# Patient Record
Sex: Male | Born: 1980 | Race: Black or African American | Hispanic: No | Marital: Single | Smoking: Current some day smoker
Health system: Southern US, Community
[De-identification: ages and names within clinical notes are randomized; demographics above are authoritative.]

## PROBLEM LIST (undated history)

## (undated) DIAGNOSIS — K589 Irritable bowel syndrome without diarrhea: Secondary | ICD-10-CM

## (undated) DIAGNOSIS — R7303 Prediabetes: Secondary | ICD-10-CM

## (undated) DIAGNOSIS — F17200 Nicotine dependence, unspecified, uncomplicated: Secondary | ICD-10-CM

## (undated) DIAGNOSIS — F99 Mental disorder, not otherwise specified: Secondary | ICD-10-CM

## (undated) DIAGNOSIS — K5792 Diverticulitis of intestine, part unspecified, without perforation or abscess without bleeding: Secondary | ICD-10-CM

## (undated) DIAGNOSIS — Z9289 Personal history of other medical treatment: Secondary | ICD-10-CM

## (undated) DIAGNOSIS — N200 Calculus of kidney: Secondary | ICD-10-CM

## (undated) DIAGNOSIS — D689 Coagulation defect, unspecified: Secondary | ICD-10-CM

## (undated) DIAGNOSIS — IMO0001 Reserved for inherently not codable concepts without codable children: Secondary | ICD-10-CM

## (undated) DIAGNOSIS — F209 Schizophrenia, unspecified: Secondary | ICD-10-CM

## (undated) DIAGNOSIS — M940 Chondrocostal junction syndrome [Tietze]: Secondary | ICD-10-CM

## (undated) DIAGNOSIS — N419 Inflammatory disease of prostate, unspecified: Secondary | ICD-10-CM

## (undated) DIAGNOSIS — A6000 Herpesviral infection of urogenital system, unspecified: Secondary | ICD-10-CM

## (undated) HISTORY — DX: Inflammatory disease of prostate, unspecified: N41.9

## (undated) HISTORY — DX: Calculus of kidney: N20.0

## (undated) HISTORY — DX: Coagulation defect, unspecified: D68.9

## (undated) HISTORY — DX: Nicotine dependence, unspecified, uncomplicated: F17.200

## (undated) HISTORY — DX: Diverticulitis of intestine, part unspecified, without perforation or abscess without bleeding: K57.92

## (undated) HISTORY — DX: Schizophrenia, unspecified: F20.9

## (undated) HISTORY — DX: Personal history of other medical treatment: Z92.89

## (undated) HISTORY — DX: Herpesviral infection of urogenital system, unspecified: A60.00

## (undated) HISTORY — DX: Chondrocostal junction syndrome (tietze): M94.0

## (undated) HISTORY — PX: WISDOM TOOTH EXTRACTION: SHX21

## (undated) HISTORY — DX: Reserved for inherently not codable concepts without codable children: IMO0001

---

## 2002-09-02 ENCOUNTER — Emergency Department (HOSPITAL_COMMUNITY): Admission: EM | Admit: 2002-09-02 | Discharge: 2002-09-02 | Payer: Self-pay | Admitting: Emergency Medicine

## 2004-07-11 ENCOUNTER — Ambulatory Visit: Payer: Self-pay | Admitting: Psychiatry

## 2004-07-11 ENCOUNTER — Emergency Department (HOSPITAL_COMMUNITY): Admission: EM | Admit: 2004-07-11 | Discharge: 2004-07-11 | Payer: Self-pay | Admitting: Family Medicine

## 2004-07-11 ENCOUNTER — Inpatient Hospital Stay (HOSPITAL_COMMUNITY): Admission: RE | Admit: 2004-07-11 | Discharge: 2004-07-24 | Payer: Self-pay | Admitting: Psychiatry

## 2009-10-14 ENCOUNTER — Emergency Department (HOSPITAL_COMMUNITY): Admission: EM | Admit: 2009-10-14 | Discharge: 2009-10-14 | Payer: Self-pay | Admitting: Emergency Medicine

## 2010-01-01 ENCOUNTER — Ambulatory Visit (HOSPITAL_COMMUNITY): Admission: RE | Admit: 2010-01-01 | Discharge: 2010-01-01 | Payer: Self-pay | Admitting: Neurosurgery

## 2010-06-01 DIAGNOSIS — A6000 Herpesviral infection of urogenital system, unspecified: Secondary | ICD-10-CM

## 2010-06-01 HISTORY — DX: Herpesviral infection of urogenital system, unspecified: A60.00

## 2010-06-22 ENCOUNTER — Encounter: Payer: Self-pay | Admitting: Neurosurgery

## 2010-08-18 LAB — COMPREHENSIVE METABOLIC PANEL
ALT: 17 U/L (ref 0–53)
Albumin: 4.3 g/dL (ref 3.5–5.2)
Alkaline Phosphatase: 78 U/L (ref 39–117)
BUN: 11 mg/dL (ref 6–23)
Chloride: 106 mEq/L (ref 96–112)
Glucose, Bld: 88 mg/dL (ref 70–99)
Potassium: 4.2 mEq/L (ref 3.5–5.1)
Sodium: 142 mEq/L (ref 135–145)
Total Bilirubin: 1.1 mg/dL (ref 0.3–1.2)
Total Protein: 7.5 g/dL (ref 6.0–8.3)

## 2010-08-18 LAB — CBC
HCT: 41.9 % (ref 39.0–52.0)
Hemoglobin: 13.9 g/dL (ref 13.0–17.0)
RDW: 13.7 % (ref 11.5–15.5)
WBC: 5 10*3/uL (ref 4.0–10.5)

## 2010-08-18 LAB — URINALYSIS, ROUTINE W REFLEX MICROSCOPIC
Glucose, UA: NEGATIVE mg/dL
Ketones, ur: 15 mg/dL — AB
Nitrite: NEGATIVE
Specific Gravity, Urine: 1.027 (ref 1.005–1.030)
pH: 5.5 (ref 5.0–8.0)

## 2010-08-18 LAB — DIFFERENTIAL
Basophils Absolute: 0 10*3/uL (ref 0.0–0.1)
Basophils Relative: 1 % (ref 0–1)
Eosinophils Absolute: 0.2 10*3/uL (ref 0.0–0.7)
Monocytes Absolute: 0.5 10*3/uL (ref 0.1–1.0)
Monocytes Relative: 9 % (ref 3–12)
Neutrophils Relative %: 45 % (ref 43–77)

## 2010-10-03 ENCOUNTER — Other Ambulatory Visit (HOSPITAL_COMMUNITY): Payer: Self-pay | Admitting: Gastroenterology

## 2010-10-17 NOTE — H&P (Signed)
NAMEORREN, PIETSCH NO.:  [id number]   MEDICAL RECORD NO.:  [id number]          PATIENT TYPE:  IPS   LOCATION:  0406                          FACILITY:  [facility]   PHYSICIAN:  Jeanice Lim, M.D. DATE OF BIRTH:  07/14/1980   DATE OF ADMISSION:  07/11/2004  DATE OF DISCHARGE:                         PSYCHIATRIC ADMISSION ASSESSMENT   IDENTIFYING INFORMATION:  This is an involuntary admission.  This is a 30-  year-old single African-American male.  Apparently a week and a half ago his  mother drove up to [place] to check on him.  He had not been to work since  that Wednesday and she got up there on Friday.  She found his apartment  completely just a mess.  He had cut his hair off.  It was all over the  floor.  He had food in various stages of cooking and disintegration all over  the kitchen, etc.  He came back here to [facility] with her this past week  and through the week he has been alternating between her home and his  grandmother's home.  He is still acting bizarrely.  He acknowledges that  when he was in [place] he did hear voices.  He states the voices have gone  away.  He does not understand why he is here.  In talking with him today, he  states he is not happy with his career but he is not sure what he should be  doing.   SOCIAL HISTORY:  He graduated in 2004.  He is employed as an Art gallery manager.  He  has never married.  He has no children.   FAMILY HISTORY:  He denies.   ALCOHOL AND DRUG ABUSE:  He denies.   PAST MEDICAL HISTORY:  Primary care Holden Draughon:  Does not have one as he has  no medical problems.  He is not prescribed medications and he has no known  drug allergies.  He was brought to the emergency room at [facility] yesterday, and  his urine drug screen was completely negative.  He had no alcohol on board,  and basically this was how he was committed, for an evaluation.  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  It could be  major depressive disorder with psychotic features, versus brief psychotic  episode.  I do not know.  It was prescribed that he take Zyprexa 15 mg last  night, however he refused it.   MENTAL STATUS EXAM:  Today, he is alert and oriented x3.  He is  appropriately groomed, appears to be well-nourished.  His gait is normal.  His motor is somewhat restless.  He is moving around, exercising all the  time.  He has moderately good eye contact.  His speech is a normal rate,  rhythm and tone, it is not pressured.  There were no delusions.  There is no  paranoia detected.  His mood, he states his mood might be a little  depressed.  His affect shows a normal range.  His thought processes are  clear, rational and goal oriented.  He would like discharge.  Judgment and  insight  are fair.  Concentration and memory are intact.  Intelligence is at  least average.  He denies suicidal or homicidal ideation.  He denies  auditory or visual hallucinations.   ADMISSION DIAGNOSES:   AXIS I:  Major depressive disorder with psychotic features, auditory  hallucinations versus brief psychotic reaction versus dissociative disorder.   AXIS II:  Deferred.   AXIS III:  None.   AXIS IV:  None.   AXIS V:  Global assessment of function is 35.Marland Kitchen   PLAN:  We will admit for safety and stabilization.  He can discuss  converting his involuntary admission to a voluntary one tomorrow.  His mood  is here.  We explained to her that without acting out behavior it would be  difficult to force medications on him and for the moment he is still  refusing medications.      MD/MEDQ  D:  07/12/2004  T:  07/12/2004  Job:  [id number]

## 2010-10-17 NOTE — Discharge Summary (Signed)
NAME:  Scott Farrell, CHECK NO.:  [id number]   MEDICAL RECORD NO.:  [id number]          PATIENT TYPE:  IPS   LOCATION:  0407                          FACILITY:  [facility]   PHYSICIAN:  Carolanne Grumbling, M.D.    DATE OF BIRTH:  08-25-1980   DATE OF ADMISSION:  07/11/2004  DATE OF DISCHARGE:  07/24/2004                                 DISCHARGE SUMMARY   INITIAL ASSESSMENT AND DIAGNOSIS:  Scott Farrell was admitted to the hospital  after at least a week and a half of bizarre behavior where he apparently had  been hearing voice and doing strange things for him, such as having an  apartment which was not clean, shaving his head, leaving the hair on the  floor, leaving food out until it was decomposing on his cabinets, not going  out of his house.  A missing persons bulletin was put out for him because he  was not answering his phone.  He lives in [place].  His parents are in  [place].  Apparently the disorder was discovered at the time of  coming to his house to find where he was.  Consequently he was admitted to  the hospital here.  While in the hospital, his laboratory exams we within  normal limits and noncontributory including a thyroid function test.   HOSPITAL COURSE:  While in the hospital he remained a bit strange and  withdrawn.  His mental status basically exhibited a young man with flat  affect who was suspicious of other people.  He seemed to be paranoid at  times, was talking to himself, also was doing strange things such as  exercises in the middle of the hallway.  One time he came to a group without  his clothes on, just his underwear, another time was kissing the floor,  seemed to be doing ritual-like things that was talking to and responding to  as if other people were not there.  He was given medication which at one  point was discovered he had been cheeking.  Initially when he took his  medication he began to look some better, and then his progress  flattened  out, but once it was sure that he was taking his medication again he cleared  fairly quickly.  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  The medications he used while he was in the hospital were Zyprexa initially,  and that was changed to Zyprexa Zydis to make sure that he was actually  taking it.  By the time of discharge he had had two sessions with his  mother, he was looking much better, still somewhat flat and somewhat  withdrawn, but affect was basically appropriate.  He was able to carry on a  conversation.  There was no evidence that he was hearing voices or seeing  things, and if he were he was able to keep it to himself without it being  noticed.  He was showering and dressing appropriately.  He denied any  threats towards himself or  anyone else and said he was looking forward to  going home.   POST HOSPITAL CARE PLANS:  He was to go home with his mother.  Eventually he  was to return to [place], but that decision was up in the air at that  point.  He was to follow up at the [facility] with  an appointment with Dr. Jacqulyn Bath on March 2 and to see Abel Presto on February  27.  There were no restrictions placed on his activities or his diet at the  time of discharge.   DISCHARGE MEDICATIONS:  1.  Effexor XR 75 mg daily.  2.  Zyprexa Zydis 20 mg at bedtime.   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   AXIS II:  Deferred/   AXIS III:  Healthy.   AXIS IV:  Moderate.   AXIS V:  50/65.      GT/MEDQ  D:  08/05/2004  T:  08/05/2004  Job:  [id number]

## 2010-10-31 ENCOUNTER — Other Ambulatory Visit (HOSPITAL_COMMUNITY): Payer: Self-pay

## 2010-10-31 ENCOUNTER — Inpatient Hospital Stay (HOSPITAL_COMMUNITY)
Admission: RE | Admit: 2010-10-31 | Discharge: 2010-10-31 | Payer: Self-pay | Source: Ambulatory Visit | Attending: Gastroenterology | Admitting: Gastroenterology

## 2010-11-05 ENCOUNTER — Encounter: Payer: Self-pay | Admitting: Medical

## 2010-11-11 ENCOUNTER — Encounter: Payer: Self-pay | Admitting: Medical

## 2010-11-11 ENCOUNTER — Other Ambulatory Visit: Payer: Self-pay

## 2010-11-11 ENCOUNTER — Ambulatory Visit (INDEPENDENT_AMBULATORY_CARE_PROVIDER_SITE_OTHER): Payer: 59 | Admitting: Medical

## 2010-11-11 DIAGNOSIS — Z0000 Encounter for general adult medical examination without abnormal findings: Secondary | ICD-10-CM

## 2010-11-11 DIAGNOSIS — N452 Orchitis: Secondary | ICD-10-CM

## 2010-11-11 DIAGNOSIS — R5381 Other malaise: Secondary | ICD-10-CM

## 2010-11-11 DIAGNOSIS — R9389 Abnormal findings on diagnostic imaging of other specified body structures: Secondary | ICD-10-CM

## 2010-11-11 DIAGNOSIS — F17200 Nicotine dependence, unspecified, uncomplicated: Secondary | ICD-10-CM

## 2010-11-11 DIAGNOSIS — R634 Abnormal weight loss: Secondary | ICD-10-CM

## 2010-11-11 DIAGNOSIS — R5383 Other fatigue: Secondary | ICD-10-CM

## 2010-11-11 DIAGNOSIS — R918 Other nonspecific abnormal finding of lung field: Secondary | ICD-10-CM

## 2010-11-11 DIAGNOSIS — N453 Epididymo-orchitis: Secondary | ICD-10-CM

## 2010-11-11 LAB — POCT URINALYSIS DIPSTICK
Bilirubin, UA: NEGATIVE
Ketones, UA: NEGATIVE
Leukocytes, UA: NEGATIVE
Spec Grav, UA: 1.02
pH, UA: 5

## 2010-11-11 NOTE — Patient Instructions (Signed)
Return in 1 week for recheck on labs and symptoms.

## 2010-11-11 NOTE — Progress Notes (Signed)
Subjective:   HPI  Scott Farrell is a 30 y.o. male who presents as a new patient for a complete physical.  He has several new complaints.  He notes feeling fatigued, achy, and feverish for the last several months.  He notes a 20 lb weight loss in the last 87mo.  He also reports some swelling in his testicles and achiness in his scrotum for a long time, > 1 year.  He notes a hx/o genital and oral herpes simplex diagnosed 1/12.  He wants things checked out.    Past Medical History  Diagnosis Date  . Costochondritis   . Schizophrenia     [facility], [place]  . Herpes genitalia 1/12   History reviewed. No pertinent past surgical history.  Family History  Problem Relation Age of Onset  . Hypertension Mother   . Stroke Mother   . Depression Mother   . Acne Mother   . Thyroid disease Mother   . Diabetes Mother   . Cancer Mother 29    colon  . Acute lymphoblastic leukemia Father   . Diabetes Father   . Cancer Brother    Reviewed allergies, medications ,medical, surgical, family, and social history.    Review of Systems Constitutional: +fatigue, feels feverish x months, occasional chills and sweats, unexpected weight change - over 20lb in the last 4 months.   Allergy: negative; denies recent sneezing, itching, congestion Dermatology: +rash on right thigh and buttocks.  No lumps.   ENT: +occasional sputum.  no runny nose, ear pain, sore throat, hoarseness, sinus pain, teeth pain, tinnitus, hearing loss, epistaxis Cardiology: denies chest pain, palpitations, edema, orthopnea, paroxysmal nocturnal dyspnea Respiratory: +hemoptysis 8 mo ago that resolved, this was when he was smoking very heavy.  denies cough, shortness of breath, dyspnea on exertion, wheezing Gastroenterology: +diarrhea, frequency of bowel movement.  denies abdominal pain, nausea, vomiting, diarrhea, blood in stool, dysphagia Hematology: denies bleeding or bruising problems Musculoskeletal: +aches in shoulders  and back;  Ophthalmology: denies vision changes, eye redness, itching, discharge Urology: +urinary frequency, dysuria, urgency; occasional incontinence;  denies hematuria GI: no penile discharge, recent herpes outbreak a month ago  Neurology: no headache, weakness, tingling, numbness, speech abnormality, memory loss, falls, dizziness Psychology: +sensitive, irritable at times;      Objective:   Filed Vitals:   11/11/10 1531  BP: 120/64  Pulse: 58    Physical Exam  General appearance: alert, no distress, WD/WN, black male, looks stated age Skin: scattered round 2-30mm erythematous and scaling lesions along bilat buttocks, otherwise no worrisome lesions HEENT: normocephalic, conjunctiva/corneas normal, sclerae anicteric, PERRLA, EOMi, nares patent, no discharge or erythema, pharynx normal Oral cavity: MMM, tongue normal, teeth normal Neck: supple, no lymphadenopathy, no thyromegaly, no masses, normal ROM, no JVD or bruits Chest: non tender, normal shape and expansion Heart: RRR, normal S1, S2, no murmurs Lungs: CTA bilaterally, no wheezes, rhonchi, or rales Abdomen: +increased bs, soft, mild generalized tenderness, but he notes the need to defecate currently, otherwise tender, non distended, no masses, no hepatomegaly, no splenomegaly, no bruits Back: non tender, normal ROM, no scoliosis Musculoskeletal: upper extremities non tender, no obvious deformity, normal ROM throughout, lower extremities non tender, no obvious deformity, normal ROM throughout GU: both testes mildly tender, slightly swollen, +bilat inguinal tender lymph nodes palpable, no rash, no discharge, no hernias, swab taken Extremities: no edema, no cyanosis, no clubbing Pulses: 2+ symmetric, upper and lower extremities, normal cap refill Neurological: alert, oriented x 3, CN2-12 intact, strength normal upper  extremities and lower extremities, sensation normal throughout, DTRs 2+ throughout, no cerebellar signs, gait  normal Psychiatric: normal affect, behavior normal, pleasant  Rectal: prostate WNL, no nodules, anus normal appearing, guaiac negative stool   Assessment :      Encounter Diagnoses  Name Primary?  . General medical examination Yes  . Fatigue   . Weight loss, abnormal   . Tobacco use disorder   . Abnormal chest x-ray   . Orchitis     Plan:    General medical exam - discussed preventive care, lab screening including STD screening, and we will have him return in 1wk to recheck and discuss findings.  EKG today with nsr, rate 45 bpm, normal intervals and axis, T wave inversion V1, V2, LVH criteria, risk factors: male and smoker, impression: sinus bradycardia, LVH, T wave inversions, abnormal EKG.  Advised we get an echocardiogram, but he delcines.  We will discuss further at 1 wk f/u.    Fatigue - labs to further eval.  Given his cardiac silhouette on CXR today, also recommended we pursue an echocardiogram.  He declines today despite the findings.  We will discuss in 1wk at his recheck.   Wt loss - labs today to further eval.  Consider early colonoscopy given his mother's hx/o.  Tobacco use disorder - advised he stop smoking.  Abnormal chest xray - enlarged cardiac silhouette, increased hilar congestion.  Will send for over read.  He has round enlarged cardiac silhouette, and in light of EKG and symptoms today, recommended echocardiogram.  Orchitis - STD testing today.

## 2010-11-12 ENCOUNTER — Other Ambulatory Visit (INDEPENDENT_AMBULATORY_CARE_PROVIDER_SITE_OTHER): Payer: 59

## 2010-11-12 ENCOUNTER — Telehealth: Payer: Self-pay | Admitting: *Deleted

## 2010-11-12 ENCOUNTER — Other Ambulatory Visit: Payer: Self-pay | Admitting: Medical

## 2010-11-12 DIAGNOSIS — N453 Epididymo-orchitis: Secondary | ICD-10-CM

## 2010-11-12 DIAGNOSIS — N452 Orchitis: Secondary | ICD-10-CM

## 2010-11-12 LAB — CBC WITH DIFFERENTIAL/PLATELET
Eosinophils Absolute: 0.2 10*3/uL (ref 0.0–0.7)
Eosinophils Relative: 3 % (ref 0–5)
HCT: 42.2 % (ref 39.0–52.0)
Hemoglobin: 14.7 g/dL (ref 13.0–17.0)
Lymphs Abs: 2.3 10*3/uL (ref 0.7–4.0)
MCH: 29.6 pg (ref 26.0–34.0)
MCHC: 34.8 g/dL (ref 30.0–36.0)
MCV: 85.1 fL (ref 78.0–100.0)
Monocytes Absolute: 0.7 10*3/uL (ref 0.1–1.0)
Monocytes Relative: 12 % (ref 3–12)
Neutrophils Relative %: 43 % (ref 43–77)
RBC: 4.96 MIL/uL (ref 4.22–5.81)

## 2010-11-12 LAB — COMPREHENSIVE METABOLIC PANEL
AST: 21 U/L (ref 0–37)
Alkaline Phosphatase: 94 U/L (ref 39–117)
BUN: 18 mg/dL (ref 6–23)
Glucose, Bld: 76 mg/dL (ref 70–99)
Total Bilirubin: 0.9 mg/dL (ref 0.3–1.2)

## 2010-11-12 LAB — GC/CHLAMYDIA PROBE AMP, GENITAL
Chlamydia, DNA Probe: NEGATIVE
GC Probe Amp, Genital: NEGATIVE

## 2010-11-12 LAB — RPR

## 2010-11-12 MED ORDER — DOXYCYCLINE HYCLATE 100 MG PO TABS: 100.0000 mg | ORAL_TABLET | Freq: Two times a day (BID) | ORAL | Status: AC

## 2010-11-12 MED ORDER — CEFTRIAXONE SODIUM 1 G IJ SOLR
250.0000 mg | Freq: Once | INTRAMUSCULAR | Status: AC
  Administered 2010-11-12: 250 mg via INTRAMUSCULAR

## 2010-11-12 NOTE — Telephone Encounter (Addendum)
Message copied by Dorthula Perfect on Wed Nov 12, 2010  3:02 PM ------      Message from: Aleen Campi, DAVID S      Created: Wed Nov 12, 2010  8:44 AM       Gonorrhea and chlamydia both negative.  See other results below.  Given his scrotal tenderness and swelling, I am going to go ahead and treat him for orchitis or infection of the testicles. I sent antibiotic to pharmacy, Doxycycline BID x 10 days, and I want him to come by for nurse visit for Rocephin 250 mg IM x 1 to treat this infection.  Have him return in week as discussed.   Pt was notified of lab results.  Came by for a nurse visit and received Rocephin 250 mg IM to treat infection.  Informed pt to go by pharmacy to pick up rx for Doxycycline and to start taking it twice a day for 10 days.  Has an appointment next Thursday.  CM,LPN

## 2010-11-12 NOTE — Progress Notes (Signed)
Addended by: Dorthula Perfect on: 11/12/2010 03:39 PM   Modules accepted: Orders

## 2010-11-21 ENCOUNTER — Ambulatory Visit (INDEPENDENT_AMBULATORY_CARE_PROVIDER_SITE_OTHER): Payer: 59 | Admitting: Medical

## 2010-11-21 ENCOUNTER — Encounter: Payer: Self-pay | Admitting: Medical

## 2010-11-21 VITALS — BP 112/68 | HR 54 | Temp 98.3°F | Ht 75.0 in | Wt 198.0 lb

## 2010-11-21 DIAGNOSIS — N453 Epididymo-orchitis: Secondary | ICD-10-CM

## 2010-11-21 DIAGNOSIS — R9389 Abnormal findings on diagnostic imaging of other specified body structures: Secondary | ICD-10-CM

## 2010-11-21 DIAGNOSIS — R5381 Other malaise: Secondary | ICD-10-CM

## 2010-11-21 DIAGNOSIS — R5383 Other fatigue: Secondary | ICD-10-CM

## 2010-11-21 DIAGNOSIS — N452 Orchitis: Secondary | ICD-10-CM

## 2010-11-21 DIAGNOSIS — R918 Other nonspecific abnormal finding of lung field: Secondary | ICD-10-CM

## 2010-11-21 DIAGNOSIS — I517 Cardiomegaly: Secondary | ICD-10-CM

## 2010-11-21 NOTE — Progress Notes (Signed)
Subjective:   HPI  Scott Farrell is a 30 y.o. male who presents for recheck from recent visit.  I saw him on 11/11/10 as a new patient for a physical.  At that time his main concern was fatigue.  At that visit, given his tobacco history, reported weight loss and fatigue, we ordered a chest xray amongst other test, and his CXR showed cardiomegaly.  He was also bradycardic on EKG.  We recomended echocardiogram at that time which he declined.  We also treated him for orchitis empirically pending other labs.  Since lasts visit he had come in for Rocephin 250mg  IM and is still taking Doxycycline.  He does note improving in the scrotal tenderness and swelling.  He also reports depressed mood, ongoing fatigue.  He sees [facility] for psychiatric care.  No other new c/o today.     Past Medical History  Diagnosis Date  . Costochondritis   . Schizophrenia     [facility], [place]  . 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 Tobacco use disorder    History reviewed. No pertinent past surgical history.  Family History  Problem Relation Age of Onset  . Hypertension Mother   . Stroke Mother   . Depression Mother   . Acne Mother   . Thyroid disease Mother   . Diabetes Mother   . Cancer Mother 55    colon  . Acute lymphoblastic leukemia Father   . Diabetes Father   . Cancer Brother    Reviewed allergies, medications ,medical, surgical, family, and social history.    Review of Systems Constitutional: +fatigue, feels feverish x months, occasional chills and sweats, unexpected weight change - over 20lb in the last 4 months.   Allergy: negative; denies recent sneezing, itching, congestion Dermatology: +rash on right thigh and buttocks.  No lumps.   ENT: +occasional sputum.  no runny nose, ear pain, sore throat, hoarseness, sinus pain, teeth pain, tinnitus, hearing loss, epistaxis Cardiology: denies chest pain, palpitations, edema, orthopnea, paroxysmal nocturnal dyspnea Respiratory: +hemoptysis 8  mo ago that resolved, this was when he was smoking very heavy.  denies cough, shortness of breath, dyspnea on exertion, wheezing Gastroenterology: +diarrhea, frequency of bowel movement.  denies abdominal pain, nausea, vomiting, diarrhea, blood in stool, dysphagia Hematology: denies bleeding or bruising problems Musculoskeletal: +aches in shoulders and back;  Urology: +urinary frequency, dysuria, urgency; occasional incontinence;  denies hematuria GI: no penile discharge, recent herpes outbreak a month ago       Objective:   Filed Vitals:   11/21/10 1329  BP: 112/68  Pulse: 54  Temp: 98.3 F (36.8 C)    Physical Exam  General appearance: alert, no distress, WD/WN, black male, looks stated age Neck: supple, no lymphadenopathy, no thyromegaly, no masses, normal ROM, no JVD or bruits Chest: non tender, normal shape and expansion Heart: RRR, normal S1, S2, no murmurs Lungs: CTA bilaterally, no wheezes, rhonchi, or rales Pulses: 2+ symmetric, upper and lower extremities, normal cap refill   Assessment :      Encounter Diagnoses  Name Primary?  . Fatigue Yes  . Abnormal chest x-ray   . Cardiomegaly   . Orchitis     Plan:    We discussed his recent labs and initial evaluation.   Given his cardiomegaly, bradycardia, and fatigue, I recommended cardiology referral vs echocardiogram.  He declines both despite the potentional risks.  I asked him to call back within a week to let me know which choice he would like to pursue.  Of note, he sees Dr. Loreta [facility] and she is aware of his mother's early hx/o colon cancer as well as his recent weight loss.   Patient Instructions  Finish Doxycycline for the infection in the scrotum.   If this continues to be a problem, let me know.  Fatigue/decreased mood - talk to your doctors/therapists at the [facility] regarding treatment for this.    Your pulse rate is low and your chest xray suggests an enlarged heart.  You decline additional  evaluation today.  I recommend you either see a cardiologist or have an ultrasound of the heart.   Please let me know soon what you want to do.

## 2010-11-21 NOTE — Patient Instructions (Addendum)
Finish Doxycycline for the infection in the scrotum.   If this continues to be a problem, let me know.  Fatigue/decreased mood - talk to your doctors/therapists at the [facility] regarding treatment for this.    Your pulse rate is low and your chest xray suggests an enlarged heart.  You decline additional evaluation today.  I recommend you either see a cardiologist or have an ultrasound of the heart.   Please let me know soon what you want to do.

## 2010-11-25 ENCOUNTER — Telehealth: Payer: Self-pay | Admitting: *Deleted

## 2010-11-26 ENCOUNTER — Telehealth: Payer: Self-pay | Admitting: *Deleted

## 2010-11-26 NOTE — Telephone Encounter (Signed)
done

## 2010-11-26 NOTE — Telephone Encounter (Addendum)
Message copied by Dorthula Perfect on Wed Nov 26, 2010 11:40 AM ------      Message from: Jac Canavan      Created: Tue Nov 25, 2010  5:14 PM       Pt called and states his "sperm" has been chronically yellow, still has discomfort in testicle and right upper thigh. He declines change in antibiotic, and wants referral. Pls refer to Urology for pelvic pain, abnormal ejaculation.  Send notes, labs, etc.   Faxed over notes, labs and referral to [facility] for an appointment.  Will notify pt of appointment when [facility] lets me know. CM,LPN

## 2010-11-27 ENCOUNTER — Inpatient Hospital Stay (INDEPENDENT_AMBULATORY_CARE_PROVIDER_SITE_OTHER)
Admission: RE | Admit: 2010-11-27 | Discharge: 2010-11-27 | Disposition: A | Discharge status: Home / Self Care | Payer: 59 | Source: Ambulatory Visit | Attending: Family Medicine | Admitting: Family Medicine

## 2010-11-27 ENCOUNTER — Ambulatory Visit (INDEPENDENT_AMBULATORY_CARE_PROVIDER_SITE_OTHER): Payer: 59

## 2010-11-27 DIAGNOSIS — S62309A Unspecified fracture of unspecified metacarpal bone, initial encounter for closed fracture: Secondary | ICD-10-CM

## 2010-11-30 DIAGNOSIS — N419 Inflammatory disease of prostate, unspecified: Secondary | ICD-10-CM

## 2010-11-30 HISTORY — DX: Inflammatory disease of prostate, unspecified: N41.9

## 2010-12-12 ENCOUNTER — Other Ambulatory Visit (HOSPITAL_COMMUNITY): Payer: Self-pay | Admitting: Gastroenterology

## 2010-12-15 ENCOUNTER — Other Ambulatory Visit (HOSPITAL_COMMUNITY): Payer: 59

## 2010-12-21 ENCOUNTER — Emergency Department (HOSPITAL_COMMUNITY): Payer: 59

## 2010-12-21 ENCOUNTER — Emergency Department (HOSPITAL_COMMUNITY)
Admission: EM | Admit: 2010-12-21 | Discharge: 2010-12-21 | Disposition: A | Discharge status: Home / Self Care | Payer: 59 | Attending: Emergency Medicine | Admitting: Emergency Medicine

## 2010-12-21 DIAGNOSIS — R51 Headache: Secondary | ICD-10-CM | POA: Insufficient documentation

## 2010-12-21 DIAGNOSIS — R55 Syncope and collapse: Secondary | ICD-10-CM | POA: Insufficient documentation

## 2010-12-21 DIAGNOSIS — R5381 Other malaise: Secondary | ICD-10-CM | POA: Insufficient documentation

## 2010-12-21 DIAGNOSIS — F3130 Bipolar disorder, current episode depressed, mild or moderate severity, unspecified: Secondary | ICD-10-CM | POA: Insufficient documentation

## 2010-12-21 DIAGNOSIS — Z79899 Other long term (current) drug therapy: Secondary | ICD-10-CM | POA: Insufficient documentation

## 2010-12-21 DIAGNOSIS — R42 Dizziness and giddiness: Secondary | ICD-10-CM | POA: Insufficient documentation

## 2010-12-21 LAB — POCT I-STAT, CHEM 8
Calcium, Ion: 1.26 mmol/L (ref 1.12–1.32)
Glucose, Bld: 93 mg/dL (ref 70–99)
HCT: 42 % (ref 39.0–52.0)
Hemoglobin: 14.3 g/dL (ref 13.0–17.0)
Potassium: 4.2 mEq/L (ref 3.5–5.1)

## 2010-12-21 MED ORDER — GADOBENATE DIMEGLUMINE 529 MG/ML IV SOLN
20.0000 mL | Freq: Once | INTRAVENOUS | Status: AC | PRN
  Administered 2010-12-21: 20 mL via INTRAVENOUS

## 2010-12-22 ENCOUNTER — Encounter: Payer: Self-pay | Admitting: Medical

## 2010-12-22 ENCOUNTER — Ambulatory Visit (INDEPENDENT_AMBULATORY_CARE_PROVIDER_SITE_OTHER): Payer: 59 | Admitting: Medical

## 2010-12-22 VITALS — BP 112/70 | HR 64 | Temp 98.4°F | Ht 75.0 in | Wt 193.0 lb

## 2010-12-22 DIAGNOSIS — R51 Headache: Secondary | ICD-10-CM

## 2010-12-22 DIAGNOSIS — I498 Other specified cardiac arrhythmias: Secondary | ICD-10-CM

## 2010-12-22 DIAGNOSIS — R42 Dizziness and giddiness: Secondary | ICD-10-CM

## 2010-12-22 DIAGNOSIS — I517 Cardiomegaly: Secondary | ICD-10-CM

## 2010-12-22 DIAGNOSIS — R001 Bradycardia, unspecified: Secondary | ICD-10-CM

## 2010-12-22 NOTE — Patient Instructions (Signed)
Eat at least 3 meals daily, and it is ok to eat some type of carbohydrates and protein every 3-4 hours to give you energy.  I recommend including at least a carbohydrate and protein each meal.    For example,  Apple + cheesestick Peanut butter crackers Chicken sandwich Toast, eggs, and bacon  Eat a variety of foods, particular fruits and vegetables.    We will refer you to cardiology to further evaluate your dizziness and syncope.     I want to see you back once you have seen cardiology and urology, in a few weeks.

## 2010-12-22 NOTE — Progress Notes (Signed)
Subjective:   HPI  Scott Farrell is a 30 y.o. male who presents for hospital f/u.  On 12/21/10 he was at work and started having dizziness, lightheadedness, chest discomfort that prompted EMS call.  Dizziness lasted 45 minutes, but chest discomfort was brief.  Was seen at [facility].  Had labs and head scan.  Was told to f/u here.  They advised him to hydrate well.  He notes in general hasn't been having these symptoms.   He notes that he has not been eating all that well and not taking care of himself.  Sometimes only eats one meal daily or not at all, works 12 hour shifts.  Not sure if food is hurting or helping him as he gets headaches.  He notes hx/o some type of growth in his brain, but on the recent hospital visit, brain scan showed no new growth.  He goes tomorrow to see Urology for f/u on pelvic pain.  No other aggravating or relieving factors.    Of note, I saw him recently for physical, and after seeing bradycardia, and enlarged heart on CXR, he and my supervising physician Dr. Susann Givens both recommended he see cardiology but he declined.  No other c/o.  The following portions of the patient's history were reviewed and updated as appropriate: allergies, current medications, past family history, past medical history, past social history, past surgical history and problem list.   Past Medical History  Diagnosis Date  . Costochondritis   . Schizophrenia     [facility], [place]  . 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 Tobacco use disorder     Review of Systems Constitutional: +anorexia, 5lb weight loss without trying, fatigue; denies fever, chills, sweats Allergy: denies congestion, sneezing Dermatology: denies rash ENT: no runny nose, ear pain, sore throat, hoarseness, sinus pain Cardiology: +12/21/10 with brief chest pain; no chest pain now; denies palpitations, edema Respiratory: denies cough, shortness of breath, wheezing Gastroenterology: denies abdominal pain, nausea, vomiting,  diarrhea, constipation Hematology: denies bleeding or bruising problems Musculoskeletal: denies arthralgias, myalgias, joint swelling, back pain Ophthalmology: denies vision changes Urology: +ongoing pelvic and scrotal discomfort, sees Urology tomorrow Neurology: +headache; no weakness, tingling, numbness, hearing changes, memory changes, fall      Objective:   Physical Exam  General appearance: alert, no distress, WD/WN, black male Skin: warm, dry HEENT: normocephalic, sclerae anicteric, PERRLA, EOMi, TMs pearly, nares patent, no discharge or erythema, pharynx normal Oral cavity: MMM, no lesions Neck: supple, no lymphadenopathy, no thyromegaly, no masses, no JVD, no bruits Heart: RRR, normal S1, S2, no murmurs Lungs: CTA bilaterally, no wheezes, rhonchi, or rales Extremities: no edema, no cyanosis, no clubbing Pulses: 2+ symmetric, upper and lower extremities, normal cap refill Neurological: alert, oriented x 3, CN2-12 intact, strength normal upper extremities and lower extremities, sensation normal throughout, DTRs 2+ throughout, no cerebellar signs, gait normal Psychiatric: seems somewhat down today, pleasant    Assessment :    Encounter Diagnoses  Name Primary?  . Dizziness Yes  . Headache   . Cardiomegaly   . Bradycardia     Plan:  I reviewed hospital report including MRIs, labs, EKG.  Chem 8 normal.  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  CT head shows the same focal lesion smaller than prior study in 12/2009, and not likely of acute significance.  His EKG again showed bradycardia.  Given the recent dizziness and chest pain, in light of our prior EKG and CXR findings, he does now agree to see cardiology for further eval.  Advised he limit alcohol intake.  Advised he eat at least 3 meals daily, and include  snacks as needed.  Discussed good healthy choices to include protein/carb each meal.    F/u here pending cardiology eval.  Note given for work x 1wk.

## 2010-12-23 ENCOUNTER — Other Ambulatory Visit (HOSPITAL_COMMUNITY): Payer: 59

## 2011-01-06 ENCOUNTER — Telehealth: Payer: Self-pay | Admitting: *Deleted

## 2011-01-06 NOTE — Telephone Encounter (Signed)
Pt called and is scheduled to go see Dr. Donnie Aho in the morning for 2D echo and treadmill stress test.  CM, LPN

## 2011-01-06 NOTE — Telephone Encounter (Signed)
pls pull his chart and the cardiology records we just received on him.  Also, pls call Dr. Donnie Aho to see if they have finished all eval on him, and if so, send last notes on him.   He needs release to go back to work, and I don't have final results to be able to release him completely.  I think he was pending 1 test from Dr Donnie Aho.

## 2011-01-07 DIAGNOSIS — Z9289 Personal history of other medical treatment: Secondary | ICD-10-CM

## 2011-01-07 DIAGNOSIS — IMO0001 Reserved for inherently not codable concepts without codable children: Secondary | ICD-10-CM

## 2011-01-07 HISTORY — DX: Reserved for inherently not codable concepts without codable children: IMO0001

## 2011-01-07 HISTORY — DX: Personal history of other medical treatment: Z92.89

## 2011-01-09 NOTE — Telephone Encounter (Signed)
pls call pt and see if Dr. Donnie Aho released him back to work or did he write a note/letter for him to go back to work.  The only thing I am waiting on is some verification that he is cleared.  Sorry its taking so long.

## 2011-01-09 NOTE — Telephone Encounter (Signed)
Spoke with pt and pt states that Dr Donnie Aho did write a letter but he also needs a letter from [place] because we referred him over.  Will call first thing Monday morning to get results on pt for stress test.  Pt also stated that he is going back to work Tuesday.  CM, LPN

## 2011-01-13 ENCOUNTER — Encounter: Payer: Self-pay | Admitting: Medical

## 2011-01-13 NOTE — Telephone Encounter (Signed)
pls get final cardio notes so i can send formal letter clearing pt.

## 2011-01-13 NOTE — Telephone Encounter (Signed)
Called Dr. York Spaniel office and they will fax over office notes on pt.  CM, LPN

## 2011-06-02 DIAGNOSIS — F99 Mental disorder, not otherwise specified: Secondary | ICD-10-CM

## 2011-06-02 HISTORY — DX: Mental disorder, not otherwise specified: F99

## 2011-07-11 ENCOUNTER — Emergency Department (HOSPITAL_COMMUNITY)
Admission: EM | Admit: 2011-07-11 | Discharge: 2011-07-11 | Discharge status: Against Medical Advice | Payer: Self-pay | Attending: Emergency Medicine | Admitting: Emergency Medicine

## 2011-07-11 ENCOUNTER — Encounter (HOSPITAL_COMMUNITY): Payer: Self-pay | Admitting: Emergency Medicine

## 2011-07-11 DIAGNOSIS — R109 Unspecified abdominal pain: Secondary | ICD-10-CM | POA: Insufficient documentation

## 2011-07-11 HISTORY — DX: Mental disorder, not otherwise specified: F99

## 2011-07-11 LAB — URINALYSIS, ROUTINE W REFLEX MICROSCOPIC
Ketones, ur: 15 mg/dL — AB
Nitrite: NEGATIVE
Specific Gravity, Urine: 1.031 — ABNORMAL HIGH (ref 1.005–1.030)
Urobilinogen, UA: 1 mg/dL (ref 0.0–1.0)

## 2011-07-11 NOTE — ED Notes (Signed)
Pt. Stated, I started having abd. Pain today and down in my groin area, and I'm urinating blood

## 2011-07-11 NOTE — ED Notes (Signed)
No answer x1

## 2011-07-11 NOTE — ED Notes (Signed)
No answer x2 

## 2011-07-12 ENCOUNTER — Encounter (HOSPITAL_COMMUNITY): Payer: Self-pay | Admitting: Emergency Medicine

## 2011-07-12 ENCOUNTER — Emergency Department (HOSPITAL_COMMUNITY): Payer: Self-pay

## 2011-07-12 ENCOUNTER — Emergency Department (HOSPITAL_COMMUNITY)
Admission: EM | Admit: 2011-07-12 | Discharge: 2011-07-12 | Disposition: A | Discharge status: Home / Self Care | Payer: Self-pay | Attending: Emergency Medicine | Admitting: Emergency Medicine

## 2011-07-12 DIAGNOSIS — I861 Scrotal varices: Secondary | ICD-10-CM | POA: Insufficient documentation

## 2011-07-12 DIAGNOSIS — N5089 Other specified disorders of the male genital organs: Secondary | ICD-10-CM | POA: Insufficient documentation

## 2011-07-12 DIAGNOSIS — N342 Other urethritis: Secondary | ICD-10-CM | POA: Insufficient documentation

## 2011-07-12 DIAGNOSIS — F17200 Nicotine dependence, unspecified, uncomplicated: Secondary | ICD-10-CM | POA: Insufficient documentation

## 2011-07-12 DIAGNOSIS — Z8659 Personal history of other mental and behavioral disorders: Secondary | ICD-10-CM | POA: Insufficient documentation

## 2011-07-12 DIAGNOSIS — N433 Hydrocele, unspecified: Secondary | ICD-10-CM | POA: Insufficient documentation

## 2011-07-12 DIAGNOSIS — N509 Disorder of male genital organs, unspecified: Secondary | ICD-10-CM | POA: Insufficient documentation

## 2011-07-12 DIAGNOSIS — R369 Urethral discharge, unspecified: Secondary | ICD-10-CM | POA: Insufficient documentation

## 2011-07-12 DIAGNOSIS — R112 Nausea with vomiting, unspecified: Secondary | ICD-10-CM | POA: Insufficient documentation

## 2011-07-12 DIAGNOSIS — R109 Unspecified abdominal pain: Secondary | ICD-10-CM | POA: Insufficient documentation

## 2011-07-12 DIAGNOSIS — R300 Dysuria: Secondary | ICD-10-CM | POA: Insufficient documentation

## 2011-07-12 DIAGNOSIS — Z79899 Other long term (current) drug therapy: Secondary | ICD-10-CM | POA: Insufficient documentation

## 2011-07-12 LAB — URINALYSIS, ROUTINE W REFLEX MICROSCOPIC
Glucose, UA: NEGATIVE mg/dL
Leukocytes, UA: NEGATIVE
pH: 6.5 (ref 5.0–8.0)

## 2011-07-12 MED ORDER — CEFTRIAXONE SODIUM 250 MG IJ SOLR
250.0000 mg | Freq: Once | INTRAMUSCULAR | Status: AC
Start: 2011-07-12 — End: 2011-07-12
  Administered 2011-07-12: 250 mg via INTRAMUSCULAR
  Filled 2011-07-12: qty 250

## 2011-07-12 MED ORDER — AZITHROMYCIN 1 G PO PACK
1.0000 g | PACK | Freq: Once | ORAL | Status: AC
  Administered 2011-07-12: 1 g via ORAL
  Filled 2011-07-12: qty 1

## 2011-07-12 MED ORDER — LIDOCAINE HCL (PF) 1 % IJ SOLN
INTRAMUSCULAR | Status: AC
  Administered 2011-07-12: 22:00:00
  Filled 2011-07-12: qty 5

## 2011-07-12 NOTE — ED Provider Notes (Signed)
History     CSN: [id number]  Arrival date & time 07/12/11  4098   First MD Initiated Contact with Patient 07/12/11 1948      Chief Complaint  Patient presents with  . Abdominal Pain    (Consider location/radiation/quality/duration/timing/severity/associated sxs/prior treatment) Patient is a 31 y.o. male presenting with male genitourinary complaint. The history is provided by the patient.  Male GU Problem Primary symptoms include dysuria, penile discharge and scrotal pain.  Primary symptoms include no genital itching, no genital lesions, no genital rash, no penile pain and no priapism. This is a new problem. Episode onset: about 2 weeks ago. The problem occurs constantly. The problem has been gradually worsening. The symptoms occur during urination. The discharge is purulent. Associated symptoms include nausea, vomiting (once a week ago) and abdominal pain (pain in right groin). Pertinent negatives include no anorexia, no diaphoresis, no abdominal swelling, no frequency, no constipation and no diarrhea. There has been no fever. He has tried nothing for the symptoms. Sexual activity: sexually active. It is unknown if his sexual partner displays symptoms of an STD. Associated medical issues include Herpes simplex. Associated medical issues do not include gonorrhea, syphilis, chlamydia or HIV.    Past Medical History  Diagnosis Date  . Costochondritis   . Schizophrenia     [facility], [place]  . 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 Tobacco use disorder   . Mental disorder     pt. staed, I dont no whatt kind    History reviewed. No pertinent past surgical history.  Family History  Problem Relation Age of Onset  . Hypertension Mother   . Stroke Mother   . Depression Mother   . Acne Mother   . Thyroid disease Mother   . Diabetes Mother   . Cancer Mother 76    colon  . Acute lymphoblastic leukemia Father   . Diabetes Father   . Cancer Brother     History  Substance Use Topics  .  Smoking status: Current Everyday Smoker -- 0.2 packs/day    Types: Cigarettes, Cigars  . Smokeless tobacco: Never Used  . Alcohol Use: 1.0 oz/week    2 drink(s) per week      Review of Systems  Constitutional: Negative for fever, chills, diaphoresis, activity change, appetite change and fatigue.  HENT: Negative for congestion, sore throat, rhinorrhea, neck pain and neck stiffness.   Eyes: Negative for photophobia, redness and visual disturbance.  Respiratory: Negative for cough, shortness of breath and wheezing.   Cardiovascular: Negative for chest pain, palpitations and leg swelling.  Gastrointestinal: Positive for nausea, vomiting (once a week ago) and abdominal pain (pain in right groin). Negative for diarrhea, constipation, blood in stool and anorexia.  Genitourinary: Positive for dysuria, discharge, scrotal swelling, testicular pain (right testicle) and penile discharge. Negative for urgency, frequency, hematuria, flank pain, penile swelling and penile pain.  Musculoskeletal: Negative for back pain.  Skin: Negative for rash and wound.  Neurological: Negative for dizziness, seizures, facial asymmetry, speech difficulty, weakness, light-headedness, numbness and headaches.  Psychiatric/Behavioral: Negative for confusion.  All other systems reviewed and are negative.    Allergies  Review of patient's allergies indicates no known allergies.  Home Medications   Current Outpatient Rx  Name Route Sig Dispense Refill  . LURASIDONE HCL 40 MG PO TABS Oral Take 40 mg by mouth daily after supper.      Marland Kitchen VALACYCLOVIR HCL 1 G PO TABS Oral Take 1,000 mg by mouth daily as needed. For outbreak  BP 132/64  Pulse 63  Temp(Src) 97.7 F (36.5 C) (Oral)  Resp 18  SpO2 100%  Physical Exam  Nursing note and vitals reviewed. Constitutional: He is oriented to person, place, and time. He appears well-developed and well-nourished.  Non-toxic appearance. No distress.  HENT:  Head:  Normocephalic and atraumatic.  Mouth/Throat: Oropharynx is clear and moist.  Eyes: Conjunctivae and EOM are normal. Pupils are equal, round, and reactive to light. No scleral icterus.  Neck: Normal range of motion. Neck supple. No JVD present.  Cardiovascular: Normal rate, regular rhythm, normal heart sounds and intact distal pulses.   No murmur heard. Pulmonary/Chest: Effort normal and breath sounds normal. No respiratory distress. He has no wheezes. He has no rales.  Abdominal: Soft. Bowel sounds are normal. He exhibits no distension. There is no tenderness. There is no rebound and no guarding.  Genitourinary: Testes normal and penis normal. Circumcised.       Fullness present in right scrotum. No testicular deformity.   No palpable hernia mass.   No penile or scrotal lesions.   Musculoskeletal: Normal range of motion.  Neurological: He is alert and oriented to person, place, and time. He has normal strength. No cranial nerve deficit. GCS eye subscore is 4. GCS verbal subscore is 5. GCS motor subscore is 6.  Skin: Skin is warm and dry. No rash noted. He is not diaphoretic.  Psychiatric: He has a normal mood and affect.    ED Course  Procedures (including critical care time)   Labs Reviewed  URINALYSIS, ROUTINE W REFLEX MICROSCOPIC  GC/CHLAMYDIA PROBE AMP, GENITAL   US Scrotum  07/12/2011  *RADIOLOGY REPORT*  Clinical Data: Right groin and testicular pain for 1 week.  Fever. No trauma.  SCROTAL ULTRASOUND DOPPLER ULTRASOUND OF THE TESTICLES  Technique:  Complete ultrasound examination of the testicles, epididymis, and other scrotal structures was performed.  Color and spectral Doppler ultrasound were also utilized to evaluate blood flow to the testicles.  Comparison:  None.  Findings:  The testicles are symmetric in size and echogenicity. The right testis measures 4.7 x 3.2 x 4.2 cm.  The left testis measures 5.1 x 3 x 3.3 cm. No testicular masses are seen, and there is no evidence of  microlithiasis.  A single tiny punctate calcification is demonstrated in the right testis. Small bilateral hydroceles are present.  There is a small right sided varicocele. Epididymides are normal.  Diffuse scrotal skin thickening bilaterally.  Blood flow is seen within both testicles on color Doppler sonography.  Doppler spectral waveforms show both arterial and venous flow signal in both testicles.  IMPRESSION: Small right-sided varicoceles.  Small bilateral hydroceles. Scrotal skin thickening.  No evidence of testicular mass or torsion.  Original Report Authenticated By: Marlon Pel, M.D.   [place] Art/ven Flow Abd Pelv Doppler  07/12/2011  *RADIOLOGY REPORT*  Clinical Data: Right groin and testicular pain for 1 week.  Fever. No trauma.  SCROTAL ULTRASOUND DOPPLER ULTRASOUND OF THE TESTICLES  Technique:  Complete ultrasound examination of the testicles, epididymis, and other scrotal structures was performed.  Color and spectral Doppler ultrasound were also utilized to evaluate blood flow to the testicles.  Comparison:  None.  Findings:  The testicles are symmetric in size and echogenicity. The right testis measures 4.7 x 3.2 x 4.2 cm.  The left testis measures 5.1 x 3 x 3.3 cm. No testicular masses are seen, and there is no evidence of microlithiasis.  A single tiny punctate calcification is demonstrated  in the right testis. Small bilateral hydroceles are present.  There is a small right sided varicocele. Epididymides are normal.  Diffuse scrotal skin thickening bilaterally.  Blood flow is seen within both testicles on color Doppler sonography.  Doppler spectral waveforms show both arterial and venous flow signal in both testicles.  IMPRESSION: Small right-sided varicoceles.  Small bilateral hydroceles. Scrotal skin thickening.  No evidence of testicular mass or torsion.  Original Report Authenticated By: Marlon Pel, M.D.     1. Urethritis   2. Right varicocele   3. Right hydrocele        MDM  31yo AAM with PMH significant for herpes genitalia and schizophrenia who presents to the ED due to dysuria, penile discharge and pain of right testicle into right groin. Onset about 2 weeks ago. Exam with normal testicle but fullness of scrotum on right. Not c/w torsion as gradual onset. UA neg. Penis swabbed for GC chlam. Will get testicular u/s to r/o torsion and further evaluate.   Treating for GC chlam with rocephin azith.   [place] neg for torsion. US demonstrates hydrocele and varicocele. Pt updated. Will refer to urology.    I saw and evaluated the patient, reviewed the resident's note and I agree with the findings and plan. Pt with dysuria and testicular pain, ultrasound showed varicocele.  Rx'd for urethritis. Osvaldo Human, M.D.     Verne Carrow, MD 07/12/11 1308  Carleene Cooper III, MD 07/14/11 [phone]

## 2011-07-12 NOTE — ED Notes (Signed)
C/o generalized abd pain and pain with urination x 2-3 weeks.  Denies nausea and diarrhea.  States he has vomited a few times over the past couple of weeks.  Reports pain is worse RUQ and lower abd.

## 2011-07-12 NOTE — ED Notes (Signed)
Patient states he has been having abd pain for "several years", penile discharge and difficulty with urination.  Also states he feels better while standing up and does not want to sit down on the bed.  Urine obtained and sent to the lab.  States pain is in the RUQ and upper abd area.

## 2011-07-13 LAB — GC/CHLAMYDIA PROBE AMP, GENITAL: GC Probe Amp, Genital: NEGATIVE

## 2011-08-12 ENCOUNTER — Emergency Department (HOSPITAL_COMMUNITY)
Admission: EM | Admit: 2011-08-12 | Discharge: 2011-08-20 | Disposition: A | Discharge status: Home / Self Care | Payer: Self-pay | Attending: Emergency Medicine | Admitting: Emergency Medicine

## 2011-08-12 ENCOUNTER — Encounter (HOSPITAL_COMMUNITY): Payer: Self-pay | Admitting: *Deleted

## 2011-08-12 DIAGNOSIS — Z8659 Personal history of other mental and behavioral disorders: Secondary | ICD-10-CM | POA: Insufficient documentation

## 2011-08-12 DIAGNOSIS — Z79899 Other long term (current) drug therapy: Secondary | ICD-10-CM | POA: Insufficient documentation

## 2011-08-12 DIAGNOSIS — F29 Unspecified psychosis not due to a substance or known physiological condition: Secondary | ICD-10-CM | POA: Insufficient documentation

## 2011-08-12 DIAGNOSIS — R4586 Emotional lability: Secondary | ICD-10-CM | POA: Insufficient documentation

## 2011-08-12 LAB — CBC
MCH: 28.2 pg (ref 26.0–34.0)
Platelets: 168 10*3/uL (ref 150–400)
RBC: 4.22 MIL/uL (ref 4.22–5.81)
WBC: 5.4 10*3/uL (ref 4.0–10.5)

## 2011-08-12 MED ORDER — ZIPRASIDONE MESYLATE 20 MG IM SOLR
10.0000 mg | Freq: Once | INTRAMUSCULAR | Status: AC
  Administered 2011-08-13: 10 mg via INTRAMUSCULAR
  Filled 2011-08-12: qty 20

## 2011-08-12 MED ORDER — IBUPROFEN 600 MG PO TABS
600.0000 mg | ORAL_TABLET | Freq: Three times a day (TID) | ORAL | Status: DC | PRN
  Administered 2011-08-18: 600 mg via ORAL
  Filled 2011-08-12: qty 1

## 2011-08-12 MED ORDER — ONDANSETRON HCL 4 MG PO TABS: 4.0000 mg | ORAL_TABLET | Freq: Three times a day (TID) | ORAL | Status: DC | PRN

## 2011-08-12 MED ORDER — ZOLPIDEM TARTRATE 5 MG PO TABS
5.0000 mg | ORAL_TABLET | Freq: Every evening | ORAL | Status: DC | PRN
  Administered 2011-08-14 – 2011-08-18 (×5): 5 mg via ORAL
  Filled 2011-08-12 (×5): qty 1

## 2011-08-12 MED ORDER — ACETAMINOPHEN 325 MG PO TABS: 650.0000 mg | ORAL_TABLET | ORAL | Status: DC | PRN

## 2011-08-12 MED ORDER — ALUM & MAG HYDROXIDE-SIMETH 200-200-20 MG/5ML PO SUSP: 30.0000 mL | ORAL | Status: DC | PRN

## 2011-08-12 MED ORDER — LORAZEPAM 1 MG PO TABS
1.0000 mg | ORAL_TABLET | Freq: Three times a day (TID) | ORAL | Status: DC | PRN
  Administered 2011-08-13 – 2011-08-17 (×5): 1 mg via ORAL
  Filled 2011-08-12 (×7): qty 1

## 2011-08-12 NOTE — ED Notes (Signed)
Pt in via [employer] with IVC paperwork, police state they were called to house due to patient acting violent at house and acting strange to family. Pt was messing with breaker box and digging holes in yard, police had to physically pull patient into car, pt will not answer any questions, history of mental health issues but has been off medication, pt sitting in wheel chair but will not follow commands or answer questions, no eye contact

## 2011-08-12 NOTE — ED Provider Notes (Signed)
History     CSN: [id number]  Arrival date & time 08/12/11  2251   First MD Initiated Contact with Patient 08/12/11 2312      Chief Complaint  Patient presents with  . Medical Clearance    (Consider location/radiation/quality/duration/timing/severity/associated sxs/prior treatment) HPI  Patient with history of schizophrenia is brought to emergency department by [employer] with IVC paperwork for bizarre behavior. Police were called to the house by his family with concern that the patient has been acting violent all day, throwing objects about the house, opening up breaker boxes and switching breakers, digging holes into the yard, and just ultimately acting bizarre. Per the mother at the home, the patient has been off his psychiatric meds for about 6 weeks because he throws them away. Patient refused to speak to the police and myself. Level 5 caveat applies due uncooperative tenderness and questionable psychosis.  Past Medical History  Diagnosis Date  . Costochondritis   . Schizophrenia     [facility], [place]  . 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 Tobacco use disorder   . Mental disorder     pt. staed, I dont no whatt kind    History reviewed. No pertinent past surgical history.  Family History  Problem Relation Age of Onset  . Hypertension Mother   . Stroke Mother   . Depression Mother   . Acne Mother   . Thyroid disease Mother   . Diabetes Mother   . Cancer Mother 65    colon  . Acute lymphoblastic leukemia Father   . Diabetes Father   . Cancer Brother     History  Substance Use Topics  . Smoking status: Current Everyday Smoker -- 0.2 packs/day    Types: Cigarettes, Cigars  . Smokeless tobacco: Never Used  . Alcohol Use: 1.0 oz/week    2 drink(s) per week      Review of Systems  All other systems reviewed and are negative.    Allergies  Review of patient's allergies indicates no known allergies.  Home Medications   Current Outpatient Rx  Name Route Sig  Dispense Refill  . LURASIDONE HCL 40 MG PO TABS Oral Take 40 mg by mouth daily after supper.      Marland Kitchen VALACYCLOVIR HCL 1 G PO TABS Oral Take 1,000 mg by mouth daily as needed. For outbreak      BP 118/66  Pulse 65  Resp 18  SpO2 100%  Physical Exam  Nursing note and vitals reviewed. Constitutional: He appears well-developed and well-nourished. No distress.  HENT:  Head: Normocephalic and atraumatic.  Eyes: Conjunctivae and EOM are normal. Pupils are equal, round, and reactive to light.  Neck: Normal range of motion. Neck supple.  Cardiovascular: Normal rate, regular rhythm, normal heart sounds and intact distal pulses.  Exam reveals no gallop and no friction rub.   No murmur heard. Pulmonary/Chest: Effort normal and breath sounds normal. No respiratory distress. He has no wheezes. He has no rales. He exhibits no tenderness.  Abdominal: Soft. Bowel sounds are normal. He exhibits no distension and no mass. There is no tenderness. There is no rebound and no guarding.  Musculoskeletal: Normal range of motion. He exhibits no edema and no tenderness.  Neurological: He is alert.  Skin: Skin is warm and dry. No rash noted. He is not diaphoretic. No erythema.  Psychiatric: His affect is labile.       Patient is sitting with body kept is stiff positioning, staring up to ceiling. He will no  respond to questioning but allows physical exam.    ED Course  Procedures (including critical care time)  Temp psych holding orders written. Patient to be moved to psych ED with labs pending. Will consult tele psych.   Labs Reviewed  CBC - Abnormal; Notable for the following:    Hemoglobin 11.9 (*)    HCT 35.2 (*)    All other components within normal limits  COMPREHENSIVE METABOLIC PANEL  ETHANOL  URINE RAPID DRUG SCREEN (HOSP PERFORMED)   No results found.   1. Psychosis       MDM  Pending labs. Signs out given to Dr. Read Drivers. Pending telepsych and act team eval.         Jenness Corner, PA 08/13/11 [phone]

## 2011-08-12 NOTE — ED Notes (Signed)
Personal belongings: shirt , shoes, jacket, underwear, shorts.

## 2011-08-13 LAB — RAPID URINE DRUG SCREEN, HOSP PERFORMED
Amphetamines: NOT DETECTED
Barbiturates: NOT DETECTED
Benzodiazepines: NOT DETECTED
Cocaine: NOT DETECTED
Tetrahydrocannabinol: NOT DETECTED

## 2011-08-13 LAB — COMPREHENSIVE METABOLIC PANEL
ALT: 15 U/L (ref 0–53)
AST: 26 U/L (ref 0–37)
Albumin: 4.8 g/dL (ref 3.5–5.2)
CO2: 26 mEq/L (ref 19–32)
Calcium: 10.1 mg/dL (ref 8.4–10.5)
Chloride: 105 mEq/L (ref 96–112)
GFR calc non Af Amer: >90 mL/min (ref >90)
Sodium: 142 mEq/L (ref 135–145)

## 2011-08-13 MED ORDER — ZIPRASIDONE MESYLATE 20 MG IM SOLR
20.0000 mg | Freq: Once | INTRAMUSCULAR | Status: AC
  Administered 2011-08-13: 20 mg via INTRAMUSCULAR
  Filled 2011-08-13: qty 20

## 2011-08-13 NOTE — ED Notes (Signed)
Pt. Standing in front of psych ed bathrooms looking up at wall mounted bathrm signs, looking from one to another, back and forth.  When asked if he needed any help, pt. Looked at [employer], nodded "no", nonverbal and assisted back to room without any difficulty.  Explained to pt. That he may come out of room whenever he needed to use btrm.

## 2011-08-13 NOTE — ED Notes (Signed)
During assessment pt reports that he missed his daughter at home "ready to spend time with her, toughin' her up punch her her around" pt  Was asked how old his daughter was reports that she was 7 months pt then started talking about "making a record meatballs going to make it over there" pointing to the corner of the room I advised pt that all the fixtures in the room needed to remain where they were and that I knew that earlier that he had removed something behind the TV pt yells  "no I didn't, I want to see the tape, it wasn't me"

## 2011-08-13 NOTE — ED Notes (Addendum)
Pt BIB [employer] from home after mother called them d/t pt exhibiting strange behavior. Taking his clothes out of the closet and putting them in the middle of his room floor. 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 Mom also reports that pt has been taking things in/out of the garage and digging ditches in the yard. Mom reports pt has been off his meds x approx 6 weeks. Pt presents on the unit in a wheelchair d/t he was shuffling as if he could not walk, pt required assistance out of wheelchair by [employer] onto bed, once officers out of room and writer going in to complete assessment, pt standing up in the floor mumbling something, pt affect is blunted, pt mute, no eye contact, pt then out of room testing doors pacing and starting to wander, pt directed back to room , MD called geodon 10 mg IM ordered/admin, pt continued to not speak and have no eye contact, looking straight ahead, blank stare on face.

## 2011-08-13 NOTE — BH Assessment (Signed)
Pt standing and staring at corner of ceiling. Pt won't reply to any questions. Writer will try to assess later in the am.

## 2011-08-13 NOTE — BH Assessment (Signed)
Assessment Note   Scott Farrell is an 31 y.o. male who presents under IVC via [facility] to [facility]. Pt hasn't spoken a word since his admission and has only stared at wall. During assessment, pt remained in bed with his head and body covered. Writer saw in pt chart that pt was at [facility] in 2006. The rest of info was collateral info from [place] RN's earlier note: "Pt brought in by [employer] from home after mother called them d/t pt exhibiting strange behavior. Taking his clothes out of the closet and putting them in the middle of his room floor. 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 Mom also reports that pt has been taking things in/out of the garage and digging ditches in the yard. Mom reports pt has been off his meds x approx 6 weeks".      Axis I: Psychotic Disorder NOS Axis II: Deferred Axis III:  Past Medical History  Diagnosis Date  . Costochondritis   . Schizophrenia     [facility], [place]  . 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 Tobacco use disorder   . Mental disorder     pt. staed, I dont no whatt kind   Axis IV: other psychosocial or environmental problems, problems related to legal system/crime, problems related to social environment and problems with primary support group Axis V: 11-20 some danger of hurting self or others possible OR occasionally fails to maintain minimal personal hygiene OR gross impairment in communication  Past Medical History:  Past Medical History  Diagnosis Date  . Costochondritis   . Schizophrenia     [facility], [place]  . 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 Tobacco use disorder   . Mental disorder     pt. staed, I dont no whatt kind    History reviewed. No pertinent past surgical history.  Family History:  Family History  Problem Relation Age of Onset  . Hypertension Mother   . Stroke Mother   . Depression  Mother   . Acne Mother   . Thyroid disease Mother   . Diabetes Mother   . Cancer Mother 62    colon  . Acute lymphoblastic leukemia Father   . Diabetes Father   . Cancer Brother     Social History:  reports that he has been smoking Cigarettes and Cigars.  He has been smoking about .25 packs per day. He has never used smokeless tobacco. He reports that he drinks about one ounce of alcohol per week. He reports that he does not use illicit drugs.  Additional Social History:    Allergies: No Known Allergies  Home Medications:  Medications Prior to Admission  Medication Dose Route Frequency Provider Last Rate Last Dose  . acetaminophen (TYLENOL) tablet 650 mg  650 mg Oral Q4H PRN Jenness Corner, PA      . alum & mag hydroxide-simeth (MAALOX/MYLANTA) 200-200-20 MG/5ML suspension 30 mL  30 mL Oral PRN Lenon Oms Hunt, PA      . ibuprofen (ADVIL,MOTRIN) tablet 600 mg  600 mg Oral Q8H PRN Jenness Corner, PA      . LORazepam (ATIVAN) tablet 1 mg  1 mg Oral Q8H PRN Bethany J Hunt, PA      . ondansetron (ZOFRAN) tablet 4 mg  4 mg Oral Q8H PRN Jenness Corner, PA      .  ziprasidone (GEODON) injection 10 mg  10 mg Intramuscular Once [employer]. Pickering, MD   10 mg at 08/13/11 0013  . zolpidem (AMBIEN) tablet 5 mg  5 mg Oral QHS PRN Jenness Corner, PA       Medications Prior to Admission  Medication Sig Dispense Refill  . lurasidone (LATUDA) 40 MG TABS Take 40 mg by mouth daily after supper.        . valACYclovir (VALTREX) 1000 MG tablet Take 1,000 mg by mouth daily as needed. For outbreak        OB/GYN Status:  No LMP for male patient.  General Assessment Data Location of Assessment: [facility] Living Arrangements: Relatives;Parent Can pt return to current living arrangement?: Yes Admission Status: Involuntary Is patient capable of signing voluntary admission?: No Transfer from: Acute Hospital Referral Source:  ([facility])  Education Status Is patient currently in school?: No  Risk to  self Substance abuse history and/or treatment for substance abuse?:  (unknown pt mute)        Mental Status Report Eye Contact: Other (Comment) Motor Activity: Freedom of movement;Other (Comment);Shuffling (pt acting as if he can't walk at times)     Prior Inpatient Therapy Prior Inpatient Therapy: Yes Prior Therapy Dates: 2006 Prior Therapy Facilty/Provider(s): [facility] Reason for Treatment: unk                              Disposition:  Disposition Disposition of Patient: Inpatient treatment program Type of inpatient treatment program: Adult  On Site Evaluation by:   Reviewed with Physician:     Donnamarie Rossetti P 08/13/2011 6:22 AM

## 2011-08-13 NOTE — ED Provider Notes (Signed)
BP 95/61  Pulse 62  Temp(Src) 97.6 F (36.4 C) (Oral)  Resp 18  SpO2 100%  Pt seen by me. Non verbal. Clutching to cover and refusing to pull cover back from head.  Forbes Cellar, MD 08/13/11 [phone]

## 2011-08-13 NOTE — ED Provider Notes (Signed)
Medical screening examination/treatment/procedure(s) were performed by non-physician practitioner and as supervising physician I was immediately available for consultation/collaboration.   Hanley Seamen, MD 08/13/11 [phone]

## 2011-08-13 NOTE — ED Notes (Signed)
Pt. Wandered out of room with blanket over head, moved to a corner in psych ed hallway and began to play with himself.  Pt. Asked to stop behavior and return to room.

## 2011-08-13 NOTE — ED Notes (Signed)
Pt remains standing in floor in room.

## 2011-08-13 NOTE — ED Notes (Signed)
Spoke with Scott Farrell Act in regards to pt tele psych reports that pt has a bed pending at [facility] and that since pt was medicated with geodon will defer tele psych consult

## 2011-08-13 NOTE — ED Notes (Signed)
Pt. Pulled electronic device off wall above tv in pt.'s room.  Facilities notified of event.  Psych ED to call facilities back when pt. Is D/C'ed.

## 2011-08-14 MED ORDER — ZIPRASIDONE MESYLATE 20 MG IM SOLR
INTRAMUSCULAR | Status: AC
  Administered 2011-08-14: 20 mg
  Filled 2011-08-14: qty 20

## 2011-08-14 MED ORDER — ZIPRASIDONE HCL 20 MG PO CAPS: 40.0000 mg | ORAL_CAPSULE | Freq: Two times a day (BID) | ORAL | Status: DC

## 2011-08-14 MED ORDER — CLONAZEPAM 1 MG PO TBDP
1.0000 mg | ORAL_TABLET | Freq: Two times a day (BID) | ORAL | Status: DC
  Administered 2011-08-15 – 2011-08-20 (×11): 1 mg via ORAL
  Filled 2011-08-14 (×11): qty 1

## 2011-08-14 NOTE — Progress Notes (Signed)
Initial contact with pt on this admission.  Pt attended Behavioral Health ED Spirituality Group facilitated by Chaplain Burnis Kingfisher and Doctoral Counseling Intern Juanetta Beets   Group focused on hope and resiliency.  After checking in, group members brainstormed definitions of hope and connections with their narrative.  Discussed periods in their life where they have recovered hope and what resources they drew on.  Group completed a "hope mandala" focusing on what was hopeful for them in the moment.   Scott (Ace) was initially reserved, but was responsive to questions and became progressively engaged.  Pt engaged with other group members, laughed and was appropriate throughout group.  Spoke about feeling as though people do not understand where he is.  Focused on perception and discovering who he is.  Pt identified wishing, wanting and understanding areas that he needs growth as definitions of hope.  Pt spoke with group about sports as being an area where he feels like himself.    Scott Farrell, [place]  Chaplain    08/14/11 1500  Clinical Encounter Type  Visited With Patient;Other (Comment) (Behavioral Health Group)  Visit Type Initial;Psychological support;Spiritual support;Social support;ED;Behavioral Health;Other (Comment) (Behavioral Health Spirituality Group)  Spiritual Encounters  Spiritual Needs Emotional
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
## 2011-08-14 NOTE — ED Notes (Signed)
Pt in Group with Chaplain's  
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
## 2011-08-14 NOTE — ED Notes (Signed)
Pt. Requested phone book, looked up number, returned phone book.

## 2011-08-14 NOTE — ED Notes (Signed)
Pt. Informed us that his nickname is "Ace".

## 2011-08-14 NOTE — BH Assessment (Signed)
Assessment Note   Scott Farrell is an 31 y.o. male who presented to [facility] under IVC for aggressive behavior at home against family, aggressive behavior and psychotic symptoms.   Armandina Stammer, NP reviewed Pt's clinical information earlier today and declined admission to [facility] due to his acuity.   Patsy Baltimore, Harlin Rain 08/14/2011 11:00 PM
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
## 2011-08-14 NOTE — ED Notes (Signed)
Pt. Asleep, breathing WNL 

## 2011-08-14 NOTE — ED Provider Notes (Signed)
11:37 PM  This Telepsych evaluation has been completed. They're recommending admission to psych unit and recommending Geodon 40 mg twice a day and clonazepam 1 mg twice per day. We'll continue current care and evaluation.     Breshay Ilg A. Patrica Duel, MD 08/14/11 [phone]

## 2011-08-15 MED ORDER — ZIPRASIDONE HCL 20 MG PO CAPS
40.0000 mg | ORAL_CAPSULE | Freq: Two times a day (BID) | ORAL | Status: DC
  Administered 2011-08-15 – 2011-08-19 (×11): 40 mg via ORAL
  Filled 2011-08-15: qty 2
  Filled 2011-08-15: qty 1
  Filled 2011-08-15 (×7): qty 2
  Filled 2011-08-15: qty 1
  Filled 2011-08-15 (×3): qty 2

## 2011-08-15 NOTE — ED Notes (Signed)
Pt up to nurses station talking non-sensical, laughing at times, telling writer she is related to him but she just doesn't want to say it, he's looking in the ceilings at the different electrical items installed, stating he knows what we're doing, he made mention that we put some type of medication in his food. Pt was assured that we don't put meds or anything that would harm him in his food. Tried to reassure pt nothing illegal is hooked up in the ceilings. Medicated pt per md orders for 1700.

## 2011-08-15 NOTE — ED Provider Notes (Signed)
Patient has been seen and examined. He has no complaints this morning. Resting comfortably.  Filed Vitals:   08/15/11 0616  BP: 105/59  Pulse: 64  Temp: 97.6 F (36.4 C)  Resp: 16   Continue current plan. Plan on psychiatric admission  Celene Kras, MD 08/15/11 [phone]

## 2011-08-16 MED ORDER — ZIPRASIDONE MESYLATE 20 MG IM SOLR
20.0000 mg | Freq: Once | INTRAMUSCULAR | Status: AC
  Administered 2011-08-16: 20 mg via INTRAMUSCULAR
  Filled 2011-08-16: qty 20

## 2011-08-16 MED ORDER — INSULIN ASPART 100 UNIT/ML ~~LOC~~ SOLN
SUBCUTANEOUS | Status: AC
  Filled 2011-08-16: qty 3

## 2011-08-16 NOTE — BHH Counselor (Signed)
Pt information and auth was sent to [facility] for review.  Pt has been in ED since 3-13 and a [facility] referral is now being initiated.  ACT has requested [facility] to reconsider placement on Monday once Scott Farrell has discharges on 400 hall beds since pt appears to started on a medication regiment.
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
## 2011-08-16 NOTE — BHH Counselor (Signed)
Confirmed with [facility] - Brett Canales that he got the paper work for the referral, gave him the auth verbally as well and provided a call back number for follow up
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
## 2011-08-16 NOTE — ED Notes (Signed)
100% dinner eaten.

## 2011-08-16 NOTE — BH Assessment (Signed)
1st opinion completed and faxed to [facility] with relevant labs, etc.
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
## 2011-08-17 MED ORDER — ZIPRASIDONE MESYLATE 20 MG IM SOLR: 20.0000 mg | Freq: Once | INTRAMUSCULAR | Status: DC

## 2011-08-17 MED ORDER — ZIPRASIDONE MESYLATE 20 MG IM SOLR
20.0000 mg | Freq: Once | INTRAMUSCULAR | Status: AC
  Administered 2011-08-17: 20 mg via INTRAMUSCULAR

## 2011-08-17 MED ORDER — ZIPRASIDONE MESYLATE 20 MG IM SOLR
10.0000 mg | Freq: Once | INTRAMUSCULAR | Status: DC
  Filled 2011-08-17: qty 20

## 2011-08-17 NOTE — ED Notes (Signed)
Klonopin given off-schedule due to pt being asleep at scheduled time after Geodon injection.

## 2011-08-17 NOTE — ED Provider Notes (Signed)
730 am resting comfrotably ,easlily arousable awaiting placement  Doug Sou, MD 08/17/11 [phone]

## 2011-08-17 NOTE — BHH Counselor (Signed)
Per previous notes, pt declined 3/13 due to shuffling gait and inability to communicate verbally. Spoke with SW and she indicates that patient's gait is normal and he communicated with her verbally today. Writer will ask [facility] to reconsider patient for a in-pt admission.

## 2011-08-17 NOTE — ED Notes (Signed)
Patient wondering the hallway again. I asked patient to please return to his room. Patient then started opening doors to other patients rooms. I informed patient he is not to go into anyone else room. That he would respect their privacy just like his privacy was being respected. Patient stood at his doorway until security came. He then went into his room.

## 2011-08-17 NOTE — ED Notes (Signed)
V.O. From Dr. Doug Sou to give the Geodon 20mg  IM stat x1

## 2011-08-17 NOTE — ED Notes (Signed)
While picking up empty breakfast tray patient stated "you have beautiful eyes. I wanna draw your eyes." I asked pt if he ate his entire breakfast and patient stated "no it ate me do ya know what I mean?" I told pt no inappropriate comments and left room. Patient in room watching tv.

## 2011-08-18 ENCOUNTER — Inpatient Hospital Stay (HOSPITAL_COMMUNITY): Admission: AD | Admit: 2011-08-18 | Payer: Self-pay | Source: Ambulatory Visit | Admitting: Psychiatry

## 2011-08-18 MED ORDER — ZIPRASIDONE MESYLATE 20 MG IM SOLR
INTRAMUSCULAR | Status: AC
  Administered 2011-08-18: 10 mg via INTRAMUSCULAR
  Filled 2011-08-18: qty 20

## 2011-08-18 MED ORDER — ZIPRASIDONE HCL 40 MG PO CAPS
40.0000 mg | ORAL_CAPSULE | Freq: Once | ORAL | Status: AC
  Administered 2011-08-18: 40 mg via ORAL
  Filled 2011-08-18: qty 1

## 2011-08-18 MED ORDER — ZIPRASIDONE MESYLATE 20 MG IM SOLR
10.0000 mg | Freq: Once | INTRAMUSCULAR | Status: AC
  Administered 2011-08-18: 10 mg via INTRAMUSCULAR

## 2011-08-18 MED ORDER — ZIPRASIDONE HCL 20 MG PO CAPS: 40.0000 mg | ORAL_CAPSULE | Freq: Once | ORAL | Status: DC

## 2011-08-18 NOTE — ED Notes (Signed)
Pt has been awake since 11pm, but has been calm and cooperative. He is somewhat restless, but stays in control of his actions. He is very polite and friendly with this RN. He has asked for his bed back, RN told him staff would have to re-evaluate that in the am. Pt is compliant with meds, and is able to carry on a reasonable conversation, talking about what he remembered from his admission and what happened to bring him here. Denies pain and other needs.
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
## 2011-08-18 NOTE — BH Assessment (Signed)
Patient accepted to [facility].  **Dr. Wallis Mart to Dr. Wallis Mart. Bed assignment pending
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
## 2011-08-18 NOTE — ED Provider Notes (Signed)
Pt ambulatory and interactive. He is to be reassessed by active this morning and then patient will hopefully be admitted to behavioral health  Toy Baker, MD 08/18/11 [phone]

## 2011-08-19 NOTE — ED Notes (Signed)
Given supplies to take shower

## 2011-08-20 NOTE — Discharge Instructions (Signed)
Confusion Confusion is the inability to think with your usual speed or clarity. Confusion may come on quickly or slowly over time. How quickly the confusion comes on depends on the cause. Confusion can be due to any number of causes. CAUSES   Concussion, head injury, or head trauma.   Seizures.   Stroke.   Fever.   Senility.   Heightened emotional states like rage or terror.   Mental illness in which the person loses the ability to determine what is real and what is not (hallucinations).   Infections.   Toxic effects from alcohol, drugs, or prescription medicines.   Dehydration and an imbalance of salts in the body (electrolytes).   Lack of sleep.   Low blood sugar (diabetes).   Low levels of oxygen (for example from chronic lung disorders).   Drug interactions or other medication side effects.   Nutritional deficiencies, especially niacin, thiamine, vitamin C, or vitamin B.   Sudden drop in body temperature (hypothermia).   Illness in the elderly. Constipation can result in confusion. An elderly person who is hospitalized may become confused due to change in daily routine.  SYMPTOMS  People often describe their thinking as cloudy or unclear when they are confused. Confusion can also include feeling disoriented. That means you are unaware of where or who you are. You may also not know what the date or time is. If confused, you may also have difficulty paying attention, remembering and making decisions. Some people also act aggressively when they are confused.  DIAGNOSIS  The medical evaluation of confusion may include:  Blood and urine tests.   X-rays.   Brain and nervous system tests.   Analyzing your brain waves (electroencphalogram or EEG).   A special X-ray (MRI) of your head or other special studies.  Your physician will ask questions such as:  Do you get days and nights mixed up?   Are you awake during regular sleep times?   Do you have trouble  recognizing people?   Do you know where you are?   Do you know the date and time?   Does the confusion come and go?   Is the confusion quickly getting worse?   Has there been a recent illness?   Has there been a recent head injury?   Are you diabetic?   Do you have a lung disorder?   What medication are you taking?   Have you taken drugs or alcohol?  TREATMENT  An admission to the hospital may not be needed, but a confused person should not be left alone. Stay with a family member or friend until the confusion clears. Avoid alcohol, pain relievers or sedative drugs until you have fully recovered. Do not drive until your caregiver says it is okay. HOME CARE INSTRUCTIONS What family and friends can do:  To find out if someone is confused ask him or her their name, age, and the date. If the person is unsure or answers incorrectly, he or she is confused.   Always introduce yourself, no matter how well the person knows you.   Often remind the person of his or her location.   Place a calendar and clock near the confused person.   Talk about current events and plans for the day.   Try to keep the environment calm, quiet and peaceful.   Make sure the patient keeps follow up appointments with their physician.  PREVENTION  Ways to prevent confusion:  Avoid alcohol.   Eat a balanced   diet.   Get enough sleep.   Do not become isolated. Spend time with other people and make plans for your days.   Keep careful watch on your blood sugar levels if you are diabetic.  SEEK IMMEDIATE MEDICAL CARE IF:   You develop severe headaches, repeated vomiting, seizures, blackouts or slurred speech.   There is increasing confusion, weakness, numbness, restlessness or personality changes.   You develop a loss of balance, have marked dizziness, feel uncoordinated or fall.   You have delusions, hallucinations or develop severe anxiety.   Your family members think you need to be rechecked.    Document Released: 06/25/2004 Document Revised: 05/07/2011 Document Reviewed: 02/21/2008 [facility] Patient Information 2012 [place], [place].Paranoia Paranoia is a distrust of others that is not based on a real reason for distrust. This may reach delusional levels. This means the paranoid person feels the world is against them when there is no reason to make them feel that way. People with paranoia feel as though people around them are "out to get them".  SIMILAR MENTAL ILNESSES  Depression is a feeling as though you are down all the time. It is normal in some situations where you have just lost a loved one. It is abnormal if you are having feelings of paranoia with it.   Dementia is a physical problem with the brain in which the brain no longer works properly. There are problems with daily activities of living. Alzheimer's disease is one example of this. Dementia is also caused by old age changes in the brain which come with the death of brain cells and small strokes.   Paranoidschizophrenia. People with paranoid schizophrenia and persecutory delusional disorder have delusions in which they feel people around them are plotting against them. Persecutory delusions in paranoid schizophrenia are bizarre, sometimes grandiose, and often accompanied by auditory hallucinations. This means the person is hearing voices that are not there.   Delusionaldisorder (persecutory type). Delusions experienced by individuals with delusional disorder are more believable than those experienced by paranoid schizophrenics; they are not bizarre, though still unjustified. Individuals with delusional disorder may seem offbeat or quirky rather than mentally ill, and therefore, may never seek treatment.  All of these problems usually do not allow these people to interact socially in an acceptable manner. CAUSES The cause of paranoia is often not known. It is common in people with extended abuse of:  Cocaine.   Amphetamine.    Marijuana.   Alcohol.  Sometimes there is an inherited tendency. It may be associated with stress or changes in brain chemistry. DIAGNOSIS  When paranoia is present, your caregiver may:  Refer you to a specialist.   Do a physical exam.   Perform other tests on you to make sure there are not other problems causing the paranoia including:   Physical problems.   Mental problems.   Chemical problems (other than drugs).  Testing may be done to determine if there is a psychiatric disability present that can be treated with medicine. TREATMENT   Paranoia that is a symptom of a psychiatric problem should be treated by professionals.   Medicines are available which can help this disorder. Antipsychotic medicine may be prescribed by your caregiver.   Sometimes psychotherapy may be useful.   Conditions such as depression or drug abuse are treated individually. If the paranoia is caused by drug abuse, a treatment facility may be helpful. Depression may be helped by antidepressants.  PROGNOSIS   Paranoid people are difficult to treat because  of their belief that everyone is out to get them or harm them. Because of this mistrust, they often must be talked into entering treatment by a trusted family member or friend. They may not want to take medicine as they may see this as an attempt to poison them.   Gradual gains in the trust of a therapist or caregiver helps in a successful treatment plan.   Some people with PPD or persecutory delusional disorder function in society without treatment in limited fashion.  Document Released: 05/21/2003 Document Revised: 05/07/2011 Document Reviewed: 01/24/2008 [facility] Patient Information 2012 [place], [place].Steroid (Corticosteroid) Psychosis A psychosis is a state of mind where a person has lost touch with reality. This means he/she hears voices or sees things that are not there. He/she may have abnormal thinking. A steroid psychosis is a state of mind  which is a side effect or reaction to taking steroids. Corticosteroids are a form of steroids which the body requires. They are commonly-used anti-inflammatory (against soreness and redness) medications. They are used for allergies and other medical conditions.  Steroid psychoses usually are sudden in their onset (beginning). But mental changes can happen at any time during therapy. Most problems occur within the first 6 to 10 hours following the administration of ACTH. This is a hormone which tells your body to produce more corticosteroid. Or problems may happen within the first 4 to 6 days following the oral administration of corticosteroids. This is the medication your caregiver has prescribed for you. The incidence of steroid induced psychosis in men and women is about equal. Patients' symptoms tend to change radically during the time of this illness. So it is hard to classify.  CAUSES  The higher the dose of steroid used, the more likely there is to be a psychotic reaction. There is not a relationship between the response to the first course of steroid treatment and response to a second course of drug. This means even if you did well once with a steroid, you may not do well the second time. This is also not related to previous psychological problems. SYMPTOMS  Symptoms include:  Depression, apathy or lack of caring about anything.   Bipolar disorder which varies from mania to depression.   Mood changes.   Sudden onset of confusion.   Mania (hyperactivity).   Agitated abnormal thinking states.   Feeling that people are not to be trusted.   Emotional ups and downs.  DIAGNOSIS  Your caregiver often knows what is wrong when the patient reacts strangely following the use of corticosteroids. It is a diagnosis made by observation and examination. TREATMENT  With no treatment, these conditions usually get better in 2 weeks to 6 months. The majority of people are better in 6 weeks. Your  caregiver has a good range of medications which can be prescribed to treat this disorder. Phenothiazines are one type of medication. They can shorten the condition noticeably and reduce the time to wellness to one or two weeks. 90% of sufferers have recovered within 6 weeks. Three percent of patients with steroid psychosis commit suicide. The remaining few percent will have an ongoing psychotic or depressive disorder or develop recurrent psychiatric symptoms. Patients, even those with affective disorder produced by steroids, tend to do poorly when treated at the same time with tricyclic antidepressants while still on steroids. These patients may also get worse even after tapering off steroids when tricyclics are used. For this reason it is recommended that tricyclic and other antidepressant medications not  be given until after the patient's steroid psychosis has been properly treated. SUMMARY  Steroid induced mental changes are common (steroid psychoses).   The overall incidence of steroid psychosis (when steroids are used to control systemic medical disorders) varies and is usually less than 6%.   There is a best time of from 24 to 96 hours to start treatment and stop the full-blown course of steroid psychosis.   Early treatment with medications and stopping steroids when possible usually produces a rapid and good response.  Document Released: 08/08/2003 Document Revised: 05/07/2011 Document Reviewed: 05/18/2005 [facility] Patient Information 2012 [place], [place].Confusion Confusion is the inability to think with your usual speed or clarity. Confusion may come on quickly or slowly over time. How quickly the confusion comes on depends on the cause. Confusion can be due to any number of causes. CAUSES   Concussion, head injury, or head trauma.   Seizures.   Stroke.   Fever.   Senility.   Heightened emotional states like rage or terror.   Mental illness in which the person loses the ability  to determine what is real and what is not (hallucinations).   Infections.   Toxic effects from alcohol, drugs, or prescription medicines.   Dehydration and an imbalance of salts in the body (electrolytes).   Lack of sleep.   Low blood sugar (diabetes).   Low levels of oxygen (for example from chronic lung disorders).   Drug interactions or other medication side effects.   Nutritional deficiencies, especially niacin, thiamine, vitamin C, or vitamin B.   Sudden drop in body temperature (hypothermia).   Illness in the elderly. Constipation can result in confusion. An elderly person who is hospitalized may become confused due to change in daily routine.  SYMPTOMS  People often describe their thinking as cloudy or unclear when they are confused. Confusion can also include feeling disoriented. That means you are unaware of where or who you are. You may also not know what the date or time is. If confused, you may also have difficulty paying attention, remembering and making decisions. Some people also act aggressively when they are confused.  DIAGNOSIS  The medical evaluation of confusion may include:  Blood and urine tests.   X-rays.   Brain and nervous system tests.   Analyzing your brain waves (electroencphalogram or EEG).   A special X-ray (MRI) of your head or other special studies.  Your physician will ask questions such as:  Do you get days and nights mixed up?   Are you awake during regular sleep times?   Do you have trouble recognizing people?   Do you know where you are?   Do you know the date and time?   Does the confusion come and go?   Is the confusion quickly getting worse?   Has there been a recent illness?   Has there been a recent head injury?   Are you diabetic?   Do you have a lung disorder?   What medication are you taking?   Have you taken drugs or alcohol?  TREATMENT  An admission to the hospital may not be needed, but a confused person  should not be left alone. Stay with a family member or friend until the confusion clears. Avoid alcohol, pain relievers or sedative drugs until you have fully recovered. Do not drive until your caregiver says it is okay. HOME CARE INSTRUCTIONS What family and friends can do:  To find out if someone is confused ask him or her  their name, age, and the date. If the person is unsure or answers incorrectly, he or she is confused.   Always introduce yourself, no matter how well the person knows you.   Often remind the person of his or her location.   Place a calendar and clock near the confused person.   Talk about current events and plans for the day.   Try to keep the environment calm, quiet and peaceful.   Make sure the patient keeps follow up appointments with their physician.  PREVENTION  Ways to prevent confusion:  Avoid alcohol.   Eat a balanced diet.   Get enough sleep.   Do not become isolated. Spend time with other people and make plans for your days.   Keep careful watch on your blood sugar levels if you are diabetic.  SEEK IMMEDIATE MEDICAL CARE IF:   You develop severe headaches, repeated vomiting, seizures, blackouts or slurred speech.   There is increasing confusion, weakness, numbness, restlessness or personality changes.   You develop a loss of balance, have marked dizziness, feel uncoordinated or fall.   You have delusions, hallucinations or develop severe anxiety.   Your family members think you need to be rechecked.  Document Released: 06/25/2004 Document Revised: 05/07/2011 Document Reviewed: 09/22/2009Hallucinations and Delusions You seem to be having hallucinations and/or delusions. You may be hearing voices that no one else can hear. This can seem very real to you. You may be having thoughts and fears that do not make sense to others. This condition can be due to mental disease like schizophrenia. It may be caused by a medical condition, such as an infection  or electrolyte disturbance. These symptoms are also seen in drug abusers, especially those who use crack cocaine and amphetamines. Drugs like PCP, LSD, MDMA, peyote, and psilocybin can also cause frightening hallucinations and loss of control. If your symptoms are due to drug abuse, your mental state should improve as the drug(s) leave your system. Someone you trust should be with you until you are better to protect you and calm your fears. Often tranquilizers are very helpful at controlling hallucinations, anxiety, and destructive behavior. Getting a proper diet and enough sleep is important to recovery. If your symptoms are not due to drugs, or do not improve over several days after stopping drug use, you need further medical or mental health care. SEEK IMMEDIATE MEDICAL CARE IF:   Your symptoms get worse, especially if you think your life is in danger   You have violent or destructive thoughts.  Recovery is possible, but you have to get proper treatment and avoid drugs that are known to cause you trouble.Hallucinations and Delusions You seem to be having hallucinations and/or delusions. You may be hearing voices that no one else can hear. This can seem very real to you. You may be having thoughts and fears that do not make sense to others. This condition can be due to mental disease like schizophrenia. It may be caused by a medical condition, such as an infection or electrolyte disturbance. These symptoms are also seen in drug abusers, especially those who use crack cocaine and amphetamines. Drugs like PCP, LSD, MDMA, peyote, and psilocybin can also cause frightening hallucinations and loss of control. If your symptoms are due to drug abuse, your mental state should improve as the drug(s) leave your system. Someone you trust should be with you until you are better to protect you and calm your fears. Often tranquilizers are very helpful at controlling hallucinations,  anxiety, and destructive behavior.  Getting a proper diet and enough sleep is important to recovery. If your symptoms are not due to drugs, or do not improve over several days after stopping drug use, you need further medical or mental health care. SEEK IMMEDIATE MEDICAL CARE IF:   Your symptoms get worse, especially if you think your life is in danger   You have violent or destructive thoughts.  Recovery is possible, but you have to get proper treatment and avoid drugs that are known to cause you trouble. Document Released: 06/25/2004 Document Revised: 05/07/2011 Document Reviewed: 05/18/2005 [facility] Patient Information 2012 [place], [place]. Document Released: 06/25/2004 Document Revised: 05/07/2011 Document Reviewed: 05/18/2005 [facility] Patient Information 2012 [place], [place]. [employer] Patient Information 2012 [place], [place].

## 2011-08-20 NOTE — ED Notes (Signed)
Pt is leaving per the pschy MD and ER MD He is awake alert and oriented skin warm and dry, he is going to take a bus home and refuses to stay back here until closer to 6 oclock.  He is dressed and ready to go

## 2011-08-20 NOTE — ED Notes (Signed)
Waiting on ACT team to get DC papers from MD pt is getting anxious to go home

## 2011-08-20 NOTE — ED Notes (Signed)
Noted Klonipin was given late

## 2011-08-20 NOTE — ED Notes (Signed)
Dr. Jacky Kindle spoke with patient over the telepschy and the decision was to send him home and see the outpatient MD and he promises to stay on his meds.  If he has any problems he is to see his MD asap and he agrees to see him.  It is ok with ER  MD to send him home.  He will be DC'd and will call his mom to come and get him.

## 2012-04-04 ENCOUNTER — Emergency Department (HOSPITAL_COMMUNITY): Payer: Medicaid Other

## 2012-04-04 ENCOUNTER — Encounter (HOSPITAL_COMMUNITY): Payer: Self-pay | Admitting: *Deleted

## 2012-04-04 ENCOUNTER — Emergency Department (HOSPITAL_COMMUNITY)
Admission: EM | Admit: 2012-04-04 | Discharge: 2012-04-04 | Payer: Medicaid Other | Attending: Emergency Medicine | Admitting: Emergency Medicine

## 2012-04-04 DIAGNOSIS — R1909 Other intra-abdominal and pelvic swelling, mass and lump: Secondary | ICD-10-CM | POA: Insufficient documentation

## 2012-04-04 DIAGNOSIS — F17200 Nicotine dependence, unspecified, uncomplicated: Secondary | ICD-10-CM | POA: Insufficient documentation

## 2012-04-04 DIAGNOSIS — Z008 Encounter for other general examination: Secondary | ICD-10-CM | POA: Insufficient documentation

## 2012-04-04 DIAGNOSIS — K5900 Constipation, unspecified: Secondary | ICD-10-CM | POA: Insufficient documentation

## 2012-04-04 DIAGNOSIS — R369 Urethral discharge, unspecified: Secondary | ICD-10-CM | POA: Insufficient documentation

## 2012-04-04 DIAGNOSIS — F489 Nonpsychotic mental disorder, unspecified: Secondary | ICD-10-CM | POA: Insufficient documentation

## 2012-04-04 DIAGNOSIS — R222 Localized swelling, mass and lump, trunk: Secondary | ICD-10-CM

## 2012-04-04 DIAGNOSIS — F209 Schizophrenia, unspecified: Secondary | ICD-10-CM | POA: Insufficient documentation

## 2012-04-04 DIAGNOSIS — R109 Unspecified abdominal pain: Secondary | ICD-10-CM | POA: Insufficient documentation

## 2012-04-04 DIAGNOSIS — R112 Nausea with vomiting, unspecified: Secondary | ICD-10-CM | POA: Insufficient documentation

## 2012-04-04 DIAGNOSIS — Z79899 Other long term (current) drug therapy: Secondary | ICD-10-CM | POA: Insufficient documentation

## 2012-04-04 DIAGNOSIS — Z8619 Personal history of other infectious and parasitic diseases: Secondary | ICD-10-CM | POA: Insufficient documentation

## 2012-04-04 DIAGNOSIS — Z8739 Personal history of other diseases of the musculoskeletal system and connective tissue: Secondary | ICD-10-CM | POA: Insufficient documentation

## 2012-04-04 LAB — CBC
Platelets: 205 10*3/uL (ref 150–400)
RDW: 14.5 % (ref 11.5–15.5)
WBC: 4.8 10*3/uL (ref 4.0–10.5)

## 2012-04-04 LAB — COMPREHENSIVE METABOLIC PANEL
ALT: 19 U/L (ref 0–53)
AST: 32 U/L (ref 0–37)
CO2: 28 mEq/L (ref 19–32)
Chloride: 100 mEq/L (ref 96–112)
Creatinine, Ser: 0.85 mg/dL (ref 0.50–1.35)
GFR calc Af Amer: >90 mL/min (ref >90)
GFR calc non Af Amer: >90 mL/min (ref >90)
Glucose, Bld: 91 mg/dL (ref 70–99)
Sodium: 137 mEq/L (ref 135–145)
Total Bilirubin: 0.4 mg/dL (ref 0.3–1.2)

## 2012-04-04 LAB — URINALYSIS, ROUTINE W REFLEX MICROSCOPIC
Glucose, UA: NEGATIVE mg/dL
Leukocytes, UA: NEGATIVE
Nitrite: NEGATIVE
Specific Gravity, Urine: 1.027 (ref 1.005–1.030)
pH: 6.5 (ref 5.0–8.0)

## 2012-04-04 LAB — RAPID URINE DRUG SCREEN, HOSP PERFORMED: Barbiturates: NOT DETECTED

## 2012-04-04 MED ORDER — DOCUSATE SODIUM 250 MG PO CAPS: 250.0000 mg | ORAL_CAPSULE | Freq: Every day | ORAL | Status: DC

## 2012-04-04 MED ORDER — ONDANSETRON HCL 4 MG/2ML IJ SOLN
4.0000 mg | Freq: Once | INTRAMUSCULAR | Status: AC
  Administered 2012-04-04: 4 mg via INTRAVENOUS
  Filled 2012-04-04: qty 2

## 2012-04-04 MED ORDER — SODIUM CHLORIDE 0.9 % IV BOLUS (SEPSIS)
1000.0000 mL | Freq: Once | INTRAVENOUS | Status: AC
  Administered 2012-04-04: 1000 mL via INTRAVENOUS

## 2012-04-04 MED ORDER — ONDANSETRON 8 MG PO TBDP
ORAL_TABLET | ORAL | Status: AC
  Administered 2012-04-04: 8 mg
  Filled 2012-04-04: qty 1

## 2012-04-04 MED ORDER — IOHEXOL 300 MG/ML  SOLN
100.0000 mL | Freq: Once | INTRAMUSCULAR | Status: AC | PRN
  Administered 2012-04-04: 100 mL via INTRAVENOUS

## 2012-04-04 MED ORDER — MAGNESIUM CITRATE PO SOLN: 296.0000 mL | Freq: Once | ORAL | Status: DC

## 2012-04-04 NOTE — ED Notes (Signed)
Pt transported from [place] by [employer] with IVC, here for medical clearance.  Pt also reports abd pain with n/v.  Pt vomiting at present.  Pt medicated with zofran ODT per protocol.

## 2012-04-04 NOTE — ED Provider Notes (Signed)
History     CSN: [id number]  Arrival date & time 04/04/12  1515   First MD Initiated Contact with Patient 04/04/12 1745      Chief Complaint  Patient presents with  . Abdominal Pain    n/v  . Medical Clearance    (Consider location/radiation/quality/duration/timing/severity/associated sxs/prior treatment) Patient is a 31 y.o. male presenting with abdominal pain. The history is provided by the patient. No language interpreter was used.  Abdominal Pain The primary symptoms of the illness include abdominal pain, nausea and vomiting. The primary symptoms of the illness do not include fever or diarrhea. The current episode started more than 2 days ago. The onset of the illness was gradual. The problem has been gradually worsening.  Associated with: c/o discharge from penis but no unprotected sex.   31 yo schizophrenic male coming from [place] with n/v and lower abdominal pain with penile discharge.  Patient is poor historian. C/o lower abdominal pain, n/v and States that when he masterbates he has pain in his scrotum and inner thighs.  No fever.  No acute distress presently. Denies any intercourse or unprotected sex x 1 year.    Past Medical History  Diagnosis Date  . Costochondritis   . Schizophrenia     [facility], [place]  . 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 Tobacco use disorder   . Mental disorder     pt. staed, I dont no whatt kind    History reviewed. No pertinent past surgical history.  Family History  Problem Relation Age of Onset  . Hypertension Mother   . Stroke Mother   . Depression Mother   . Acne Mother   . Thyroid disease Mother   . Diabetes Mother   . Cancer Mother 24    colon  . Acute lymphoblastic leukemia Father   . Diabetes Father   . Cancer Brother     History  Substance Use Topics  . Smoking status: Current Every Day Smoker -- 0.2 packs/day    Types: Cigarettes, Cigars  . Smokeless tobacco: Never Used  . Alcohol Use: 1.0 oz/week    2 drink(s)  per week      Review of Systems  Constitutional: Negative.  Negative for fever.  HENT: Negative.   Eyes: Negative.   Respiratory: Negative.   Cardiovascular: Negative.   Gastrointestinal: Positive for nausea, vomiting and abdominal pain. Negative for diarrhea, blood in stool and abdominal distention.  Neurological: Negative.   Psychiatric/Behavioral: Negative.   All other systems reviewed and are negative.    Allergies  Review of patient's allergies indicates no known allergies.  Home Medications   Current Outpatient Rx  Name  Route  Sig  Dispense  Refill  . LURASIDONE HCL 80 MG PO TABS   Oral   Take 40 mg by mouth daily with breakfast.         . QUETIAPINE FUMARATE 50 MG PO TABS   Oral   Take 100 mg by mouth at bedtime.           BP 122/68  Pulse 65  Temp 98.5 F (36.9 C) (Oral)  Resp 20  SpO2 100%  Physical Exam  Nursing note and vitals reviewed. Constitutional: He is oriented to person, place, and time. He appears well-developed and well-nourished.  HENT:  Head: Normocephalic.  Eyes: Conjunctivae normal and EOM are normal. Pupils are equal, round, and reactive to light.  Neck: Normal range of motion. Neck supple.  Cardiovascular: Normal rate.   Pulmonary/Chest: Effort normal  and breath sounds normal.  Abdominal: Soft. Bowel sounds are normal. He exhibits no distension and no mass. There is tenderness. There is no rebound and no guarding.  Genitourinary: Testes normal. Right testis shows no mass, no swelling and no tenderness. Left testis shows no mass, no swelling and no tenderness. Circumcised. No phimosis, paraphimosis, hypospadias, penile erythema or penile tenderness. Discharge found.  Musculoskeletal: Normal range of motion.  Neurological: He is alert and oriented to person, place, and time.  Skin: Skin is warm and dry.  Psychiatric: He has a normal mood and affect.    ED Course  Procedures (including critical care time)   Labs Reviewed    COMPREHENSIVE METABOLIC PANEL  URINE RAPID DRUG SCREEN (HOSP PERFORMED)  URINALYSIS, ROUTINE W REFLEX MICROSCOPIC  CBC  GC/CHLAMYDIA PROBE AMP, GENITAL   Ct Abdomen Pelvis W Contrast  04/04/2012  *RADIOLOGY REPORT*  Clinical Data: Lower abdominal pain.  Nausea and vomiting.  CT ABDOMEN AND PELVIS WITH CONTRAST  Technique:  Multidetector CT imaging of the abdomen and pelvis was performed following the standard protocol during bolus administration of intravenous contrast.  Contrast: OMNIPAQUE IOHEXOL 300 MG/ML. Oral contrast was also administered.  Comparison: None.  Findings: Liver normal in appearance.  Normal-appearing spleen, though there are at least 4 accessory splenules, one anterior to the upper pole, one medial to the spleen at the hilum, and two inferior to the spleen.  Normal pancreas, adrenal glands, and right kidney.  Non-obstructing approximate 1-2 mm calculus in an upper pole calix of the otherwise normal-appearing left kidney. Gallbladder unremarkable by CT.  No biliary ductal dilation. No visible aorto-iliofemoral atherosclerosis.  No significant lymphadenopathy.  Approximate 3.5 cm diameter mass at the esophagogastric junction, origin most likely from the medial wall of the gastric cardia, positioned beneath the diaphragm.  Stomach otherwise normal in appearance.  Normal-appearing small bowel.  Very large stool burden throughout normal appearing colon.  Mobile cecum positioned in the right mid abdomen; while the appendix is not clearly identified, there is no pericecal inflammation.  No ascites.  Phleboliths low in the left side of the pelvis.  Normal prostate gland.  Small cysts within the seminal vesicles.  Urinary bladder unremarkable.  Bone window images unremarkable.  Visualized lung bases clear.  IMPRESSION:  1.  No acute abnormalities involving the abdomen or pelvis.  Large stool burden. 2.  Approximate 3.5 cm diameter mass which arises from the medial gastric wall of the  cardia.  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 No evidence of metastasis. 3.  Polysplenia. 4.  Non-obstructing 1-2 mm calculus in an upper pole calix of the left kidney.   Original Report Authenticated By: Hulan Saas, M.D.      No diagnosis found.    MDM  Lower abdominal pain / n/v and penile discharge.  GC/chlamydia pending. Will follow up at std clinic for worsening symptoms.  Doubt + for std today.  CT abdomen shows constipation and 3.5 cm mass at the esopagogastric junction.  Dr. Dwain Sarna consulted and patient will follow up with him in the next week.   Doubt this is the cause of his pain.  Will treat constipation with colace and magnesium citrate.  Ready for transfer back to [facility].  Patient is medically cleared.          Remi Haggard, NP 04/05/12 1208

## 2012-04-05 LAB — GC/CHLAMYDIA PROBE AMP, GENITAL
Chlamydia, DNA Probe: NEGATIVE
GC Probe Amp, Genital: NEGATIVE

## 2012-04-05 NOTE — ED Provider Notes (Signed)
Medical screening examination/treatment/procedure(s) were performed by non-physician practitioner and as supervising physician I was immediately available for consultation/collaboration.   Gwyneth Sprout, MD 04/05/12 1538

## 2012-04-18 ENCOUNTER — Telehealth (INDEPENDENT_AMBULATORY_CARE_PROVIDER_SITE_OTHER): Payer: Self-pay | Admitting: General Surgery

## 2012-04-18 NOTE — Telephone Encounter (Signed)
[facility] making patient aware of switch and new time of appt with Dr Andrey Campanile. Patient to call back if any questions or new time does not work for him.

## 2012-04-18 NOTE — Telephone Encounter (Signed)
Message copied by Liliana Cline on Mon Apr 18, 2012  3:18 PM ------      Message from: Littie Deeds      Created: Mon Apr 18, 2012  2:14 PM       Do you think you can find a good time for this to go on Wilson's schedule.  [place] sent this message to him as well.  When you get an appt made for him will you call the patient and let him know, or sen me the info so I can?      ----- Message -----         From: Emelia Loron, MD         Sent: 04/17/2012   2:39 AM           To: Atilano Ina, MD,FACS, Gaspar Garbe      This man has a tumor off the side of his stomach.  This is probably better to go to one of the bariatric guys.  If possible please move him to see Dr. Andrey Campanile.  I will copy him on this also.      MW

## 2012-04-21 ENCOUNTER — Ambulatory Visit (INDEPENDENT_AMBULATORY_CARE_PROVIDER_SITE_OTHER): Payer: Self-pay | Admitting: General Surgery

## 2012-04-21 ENCOUNTER — Encounter (INDEPENDENT_AMBULATORY_CARE_PROVIDER_SITE_OTHER): Payer: Self-pay | Admitting: General Surgery

## 2012-04-21 VITALS — BP 122/78 | HR 74 | Temp 97.2°F | Resp 14 | Ht 75.0 in | Wt 210.0 lb

## 2012-04-21 DIAGNOSIS — K319 Disease of stomach and duodenum, unspecified: Secondary | ICD-10-CM

## 2012-04-21 DIAGNOSIS — K3189 Other diseases of stomach and duodenum: Secondary | ICD-10-CM | POA: Insufficient documentation

## 2012-04-21 NOTE — Patient Instructions (Signed)
You have a growth on your stomach - we will refer to the GI medicine doctor to look at it with a scope. They will see you first before doing the procedure You need to drink 6-8 glasses of water per day You need to take a stool softner daily You need to eat a high fiber diet (green leafy vegetables, brown bread, beans, nuts, etc)

## 2012-04-22 ENCOUNTER — Encounter: Payer: Self-pay | Admitting: Gastroenterology

## 2012-04-27 ENCOUNTER — Ambulatory Visit (INDEPENDENT_AMBULATORY_CARE_PROVIDER_SITE_OTHER): Payer: Self-pay | Admitting: Gastroenterology

## 2012-04-27 ENCOUNTER — Encounter: Payer: Self-pay | Admitting: Gastroenterology

## 2012-04-27 VITALS — BP 132/78 | HR 76 | Ht 75.0 in | Wt 208.0 lb

## 2012-04-27 DIAGNOSIS — Z800 Family history of malignant neoplasm of digestive organs: Secondary | ICD-10-CM

## 2012-04-27 DIAGNOSIS — K319 Disease of stomach and duodenum, unspecified: Secondary | ICD-10-CM

## 2012-04-27 DIAGNOSIS — K5900 Constipation, unspecified: Secondary | ICD-10-CM

## 2012-04-27 DIAGNOSIS — K3189 Other diseases of stomach and duodenum: Secondary | ICD-10-CM

## 2012-04-27 NOTE — Patient Instructions (Addendum)
Upper GI Endoscopy Upper GI endoscopy means using a flexible scope to look at the esophagus, stomach, and upper small bowel. This is done to make a diagnosis in people with heartburn, abdominal pain, or abnormal bleeding. Sometimes an endoscope is needed to remove foreign bodies or food that become stuck in the esophagus; it can also be used to take biopsy samples. For the best results, do not eat or drink for 8 hours before having your upper endoscopy.  To perform the endoscopy, you will probably be sedated and your throat will be numbed with a special spray. The endoscope is then slowly passed down your throat (this will not interfere with your breathing). An endoscopy exam takes 15 to 30 minutes to complete and there is no real pain. Patients rarely remember much about the procedure. The results of the test may take several days if a biopsy or other test is taken.  You may have a sore throat after an endoscopy exam. Serious complications are very rare. Stick to liquids and soft foods until your pain is better. Do not drive a car or operate any dangerous equipment for at least 24 hours after being sedated. SEEK IMMEDIATE MEDICAL CARE IF:   You have severe throat pain.   You have shortness of breath.   You have bleeding problems.   You have a fever.   You have difficulty recovering from your sedation.  Document Released: 06/25/2004 Document Revised: 05/07/2011 Document Reviewed: 05/20/2008 [facility] Patient Information 2012 [place], [place]. Take Miralax every day or every other day

## 2012-04-27 NOTE — Assessment & Plan Note (Addendum)
Constipation may be related to medications and/or be functional. This is likely causing his lower abdominal discomfort.  Recommendations #1 MiraLax every day or every other day as needed

## 2012-04-27 NOTE — Assessment & Plan Note (Signed)
Plan to begin screening colonoscopy at approximately age 31

## 2012-04-27 NOTE — Assessment & Plan Note (Signed)
Gastric mass is probably an incidental finding on CT and due to a stromal tumor.  Recommendations #1 upper endoscopy

## 2012-04-27 NOTE — Progress Notes (Addendum)
History of Present Illness:  31 year old African American male referred at the request of Dr. Andrey Campanile for evaluation of a gastric mass. He was seen in the ED earlier this month because of lower abdominal pain. He suffers from chronic constipation. CT scan, which I reviewed, demonstrated  3.5 centimeter mass in the gastric cardia. A large amount of stool was also noted. He denies nausea, vomiting or dysphagia. Lower abdominal discomfort subsides if he has a large stool. There's no history of melena or hematochezia.    Past Medical History  Diagnosis Date  . Costochondritis   . Schizophrenia     [facility], [place]  . 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 Tobacco use disorder   . Mental disorder     pt. staed, I dont no whatt kind  . Clotting disorder   . Substance abuse    No past surgical history on file. family history includes Acne in his mother; Acute lymphoblastic leukemia in his father; Breast cancer in his maternal aunt; Cancer in his brother; Colon cancer (age of onset:51) in his mother; Depression in his mother; Diabetes in his father and mother; Hypertension in his mother; Ovarian cancer in his maternal aunt; Stroke in his mother; and Thyroid disease in his mother. Current Outpatient Prescriptions  Medication Sig Dispense Refill  . lurasidone (LATUDA) 80 MG TABS Take 40 mg by mouth daily with breakfast.      . QUEtiapine (SEROQUEL) 50 MG tablet Take 100 mg by mouth at bedtime.      . docusate sodium (COLACE) 250 MG capsule Take 1 capsule (250 mg total) by mouth daily.  10 capsule  0  . magnesium citrate solution Take 296 mLs by mouth once.  300 mL  0   Allergies as of 04/27/2012  . (No Known Allergies)    reports that he has quit smoking. His smoking use included Cigarettes and Cigars. He smoked .25 packs per day. He has never used smokeless tobacco. He reports that he drinks about one ounce of alcohol per week. He reports that he does not use illicit drugs.     Review of Systems:  Pertinent positive and negative review of systems were noted in the above HPI section. All other review of systems were otherwise negative.  Vital signs were reviewed in today's medical record Physical Exam: General: Well developed , well nourished, no acute distress Head: Normocephalic and atraumatic Eyes:  sclerae anicteric, EOMI Ears: Normal auditory acuity Mouth: No deformity or lesions Neck: Supple, no masses or thyromegaly Lungs: Clear throughout to auscultation Heart: Regular rate and rhythm; no murmurs, rubs or bruits Abdomen: Soft and non distended. No masses, hepatosplenomegaly or hernias noted. Normal Bowel sounds.  There is mild tenderness in the right upper quadrant Rectal:deferred Musculoskeletal: Symmetrical with no gross deformities  Skin: No lesions on visible extremities Pulses:  Normal pulses noted Extremities: No clubbing, cyanosis, edema or deformities noted Neurological: Alert oriented x 4, grossly nonfocal Cervical Nodes:  No significant cervical adenopathy Inguinal Nodes: No significant inguinal adenopathy Psychological:  Alert and cooperative. Normal mood and affect

## 2012-05-04 NOTE — Progress Notes (Signed)
Patient ID: Scott Farrell, male   DOB: 1981-02-08, 31 y.o.   MRN: [id number]  Chief Complaint  Patient presents with  . Mass    abdominal    HPI Scott Farrell is a 31 y.o. male.   HPI 31yo schizophrenic AAM referred by Dr Anitra Lauth for evaluation of an incendital proximal gastric mass found on CT during a recent ED visit. The patient had gone to the ED c/o of lower abdominal pain, nausea, vomiting. He also c/o b/l burning pain in his groin which has been present for 10 years. He also c/o pain with masturbation. He states he has had some weight loss. There is a family history of colon cancer in his mother dx at age 23. He denies intolerance to solids or liquids. He is not a terribly good historian. He answers affirmative to most of my questions.   Past Medical History  Diagnosis Date  . Costochondritis   . Schizophrenia     [facility], [place]  . 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 Tobacco use disorder   . Mental disorder     pt. staed, I dont no whatt kind  . Clotting disorder   . Substance abuse     No past surgical history on file.  Family History  Problem Relation Age of Onset  . Hypertension Mother   . Stroke Mother   . Depression Mother   . Acne Mother   . Thyroid disease Mother   . Diabetes Mother   . Colon cancer Mother 48  . Acute lymphoblastic leukemia Father   . Diabetes Father   . Cancer Brother   . Breast cancer Maternal Aunt   . Ovarian cancer Maternal Aunt     Social History History  Substance Use Topics  . Smoking status: Former Smoker -- 0.2 packs/day    Types: Cigarettes, Cigars  . Smokeless tobacco: Never Used  . Alcohol Use: 1.0 oz/week    2 drink(s) per week    No Known Allergies  Current Outpatient Prescriptions  Medication Sig Dispense Refill  . lurasidone (LATUDA) 80 MG TABS Take 40 mg by mouth daily with breakfast.      . QUEtiapine (SEROQUEL) 50 MG tablet Take 100 mg by mouth at bedtime.      . docusate sodium (COLACE) 250 MG  capsule Take 1 capsule (250 mg total) by mouth daily.  10 capsule  0  . magnesium citrate solution Take 296 mLs by mouth once.  300 mL  0    Review of Systems Review of Systems  Constitutional: Positive for appetite change and unexpected weight change. Negative for fever and chills.  HENT: Positive for nosebleeds. Negative for hearing loss and neck stiffness.   Eyes: Negative for visual disturbance.  Respiratory: Positive for cough, chest tightness and wheezing.   Cardiovascular: Negative for palpitations.  Gastrointestinal: Positive for vomiting, abdominal pain and constipation.  Genitourinary: Positive for dysuria and discharge.  Musculoskeletal: Negative for back pain.  Neurological: Negative for tremors, facial asymmetry and weakness.  Psychiatric/Behavioral: Negative for self-injury.       Schizophrenic    Blood pressure 122/78, pulse 74, temperature 97.2 F (36.2 C), resp. rate 14, height 6\' 3"  (1.905 m), weight 210 lb (95.255 kg).  Physical Exam Physical Exam  Vitals reviewed. Constitutional: He is oriented to person, place, and time. He appears well-developed and well-nourished. No distress.       Healthy appearing; retches during appt  HENT:  Head: Normocephalic and atraumatic.  Right Ear: External ear  normal.  Left Ear: External ear normal.  Eyes: Conjunctivae normal and EOM are normal. No scleral icterus.  Neck: Neck supple. No tracheal deviation present. No thyromegaly present.  Cardiovascular: Normal rate, regular rhythm and normal heart sounds.   Pulmonary/Chest: Effort normal and breath sounds normal. No respiratory distress. He has no wheezes.  Abdominal: Soft. Bowel sounds are normal. He exhibits no distension. There is tenderness (mild scattered TTP). There is no rebound and no guarding. No hernia. Hernia confirmed negative in the right inguinal area and confirmed negative in the left inguinal area.  Genitourinary: Testes normal and penis normal.   Musculoskeletal: He exhibits no edema and no tenderness.  Lymphadenopathy:    He has no cervical adenopathy.  Neurological: He is alert and oriented to person, place, and time.  Skin: Skin is warm and dry. He is not diaphoretic.  Psychiatric: His speech is rapid and/or pressured. He is not agitated, not slowed and not withdrawn. He expresses impulsivity.    Data Reviewed Scrotal U/s  ED note and labs from early Nov  CT ABDOMEN AND PELVIS WITH CONTRAST  Technique: Multidetector CT imaging of the abdomen and pelvis was  performed following the standard protocol during bolus  administration of intravenous contrast.  Contrast: OMNIPAQUE IOHEXOL 300 MG/ML. Oral contrast was also  administered.  Comparison: None.  Findings: Liver normal in appearance. Normal-appearing spleen,  though there are at least 4 accessory splenules, one anterior to  the upper pole, one medial to the spleen at the hilum, and two  inferior to the spleen. Normal pancreas, adrenal glands, and right  kidney. Non-obstructing approximate 1-2 mm calculus in an upper  pole calix of the otherwise normal-appearing left kidney.  Gallbladder unremarkable by CT. No biliary ductal dilation. No  visible aorto-iliofemoral atherosclerosis. No significant  lymphadenopathy.  Approximate 3.5 cm diameter mass at the esophagogastric junction,  origin most likely from the medial wall of the gastric cardia,  positioned beneath the diaphragm. Stomach otherwise normal in  appearance. Normal-appearing small bowel. Very large stool burden  throughout normal appearing colon. Mobile cecum positioned in the  right mid abdomen; while the appendix is not clearly identified,  there is no pericecal inflammation. No ascites.  Phleboliths low in the left side of the pelvis. Normal prostate  gland. Small cysts within the seminal vesicles. Urinary bladder  unremarkable.  Bone window images unremarkable. Visualized lung bases clear.   IMPRESSION:  1. No acute abnormalities involving the abdomen or pelvis. Large  stool burden.  2. Approximate 3.5 cm diameter mass which arises from the medial  gastric wall of the cardia. 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  No evidence of metastasis.  3. Polysplenia.  4. Non-obstructing 1-2 mm calculus in an upper pole calix of the  left kidney.   Assessment    3.5cm proximal gastric mass    Plan    It is somewhat challenging to get an accurate picture of pt's symptoms if there are any. He answers affirmative to most of my questions. Nontheless, there is a proximal mass on the medial aspect of gastric cardia that I believe needs additional work-up. It appears most c/w a GIST but I believe the patient will be best served by a GI evaluation with EGD/ +/- EUS with biopsy. Follow-up will be based on outcome of GI evaluation workup.  Mary Sella. Andrey Campanile, MD, FACS General, Bariatric, & Minimally Invasive Surgery [facility], [place]        [facility] M 05/04/2012, 11:13  AM    

## 2012-06-01 DIAGNOSIS — N200 Calculus of kidney: Secondary | ICD-10-CM

## 2012-06-01 HISTORY — DX: Calculus of kidney: N20.0

## 2012-06-04 ENCOUNTER — Encounter (HOSPITAL_COMMUNITY): Payer: Self-pay | Admitting: Emergency Medicine

## 2012-06-04 ENCOUNTER — Emergency Department (HOSPITAL_COMMUNITY)
Admission: EM | Admit: 2012-06-04 | Discharge: 2012-06-04 | Disposition: A | Discharge status: Home / Self Care | Payer: Medicaid Other | Attending: Emergency Medicine | Admitting: Emergency Medicine

## 2012-06-04 DIAGNOSIS — Z8659 Personal history of other mental and behavioral disorders: Secondary | ICD-10-CM | POA: Insufficient documentation

## 2012-06-04 DIAGNOSIS — Z79899 Other long term (current) drug therapy: Secondary | ICD-10-CM | POA: Insufficient documentation

## 2012-06-04 DIAGNOSIS — Z87891 Personal history of nicotine dependence: Secondary | ICD-10-CM | POA: Insufficient documentation

## 2012-06-04 DIAGNOSIS — Z862 Personal history of diseases of the blood and blood-forming organs and certain disorders involving the immune mechanism: Secondary | ICD-10-CM | POA: Insufficient documentation

## 2012-06-04 DIAGNOSIS — F209 Schizophrenia, unspecified: Secondary | ICD-10-CM | POA: Insufficient documentation

## 2012-06-04 DIAGNOSIS — R350 Frequency of micturition: Secondary | ICD-10-CM | POA: Insufficient documentation

## 2012-06-04 DIAGNOSIS — Z8619 Personal history of other infectious and parasitic diseases: Secondary | ICD-10-CM | POA: Insufficient documentation

## 2012-06-04 DIAGNOSIS — R319 Hematuria, unspecified: Secondary | ICD-10-CM

## 2012-06-04 LAB — COMPREHENSIVE METABOLIC PANEL
ALT: 16 U/L (ref 0–53)
AST: 24 U/L (ref 0–37)
CO2: 25 mEq/L (ref 19–32)
Calcium: 9.6 mg/dL (ref 8.4–10.5)
Chloride: 102 mEq/L (ref 96–112)
Creatinine, Ser: 0.98 mg/dL (ref 0.50–1.35)
GFR calc Af Amer: >90 mL/min (ref >90)
GFR calc non Af Amer: >90 mL/min (ref >90)
Glucose, Bld: 92 mg/dL (ref 70–99)
Total Bilirubin: 0.3 mg/dL (ref 0.3–1.2)

## 2012-06-04 LAB — URINALYSIS, ROUTINE W REFLEX MICROSCOPIC
Glucose, UA: NEGATIVE mg/dL
Leukocytes, UA: NEGATIVE
Protein, ur: NEGATIVE mg/dL
Specific Gravity, Urine: 1.029 (ref 1.005–1.030)
pH: 6 (ref 5.0–8.0)

## 2012-06-04 LAB — CBC WITH DIFFERENTIAL/PLATELET
Basophils Absolute: 0 10*3/uL (ref 0.0–0.1)
Eosinophils Relative: 3 % (ref 0–5)
HCT: 35.7 % — ABNORMAL LOW (ref 39.0–52.0)
Hemoglobin: 12.3 g/dL — ABNORMAL LOW (ref 13.0–17.0)
Lymphocytes Relative: 43 % (ref 12–46)
Lymphs Abs: 2 10*3/uL (ref 0.7–4.0)
MCV: 84.4 fL (ref 78.0–100.0)
Monocytes Absolute: 0.5 10*3/uL (ref 0.1–1.0)
Monocytes Relative: 11 % (ref 3–12)
Neutro Abs: 2 10*3/uL (ref 1.7–7.7)
RBC: 4.23 MIL/uL (ref 4.22–5.81)
RDW: 14.3 % (ref 11.5–15.5)
WBC: 4.6 10*3/uL (ref 4.0–10.5)

## 2012-06-04 MED ORDER — OXYCODONE-ACETAMINOPHEN 5-325 MG PO TABS
2.0000 | ORAL_TABLET | Freq: Once | ORAL | Status: AC
  Administered 2012-06-04: 2 via ORAL
  Filled 2012-06-04: qty 2

## 2012-06-04 NOTE — ED Notes (Signed)
MD attempted to see if pt had blood clots in his bladder with [place] but pt bladder empty. Pt given PO fluids to hydrate

## 2012-06-04 NOTE — ED Provider Notes (Addendum)
History     CSN: [id number]  Arrival date & time 06/04/12  1119   First MD Initiated Contact with Patient 06/04/12 1135      Chief Complaint  Patient presents with  . Hematuria    (Consider location/radiation/quality/duration/timing/severity/associated sxs/prior treatment) Patient is a 32 y.o. male presenting with hematuria. The history is provided by the patient.  Hematuria This is a new (Symptoms started after having a catheter placed on December 25) problem. The current episode started 1 to 4 weeks ago. The problem is unchanged. He describes the hematuria as gross hematuria. The hematuria occurs throughout his entire (worse in the morning) urinary stream. He reports clotting at the end of his urine stream. His pain is at a severity of 5/10. The pain is moderate. He describes his urine color as yellow. Irritative symptoms include frequency. Obstructive symptoms include straining. Obstructive symptoms do not include dribbling or incomplete emptying. has to strain to get clot out and also blood oozes from the tip of penis. Pertinent negatives include no abdominal pain, dysuria, fever, inability to urinate, nausea or vomiting. His past medical history is significant for GU trauma. There is no history of hypertension, kidney stones or prostatitis.    Past Medical History  Diagnosis Date  . Costochondritis   . Schizophrenia     [facility], [place]  . 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 Tobacco use disorder   . Mental disorder     pt. staed, I dont no whatt kind  . Clotting disorder   . Substance abuse     History reviewed. No pertinent past surgical history.  Family History  Problem Relation Age of Onset  . Hypertension Mother   . Stroke Mother   . Depression Mother   . Acne Mother   . Thyroid disease Mother   . Diabetes Mother   . Colon cancer Mother 65  . Acute lymphoblastic leukemia Father   . Diabetes Father   . Cancer Brother   . Breast cancer Maternal Aunt   . Ovarian  cancer Maternal Aunt     History  Substance Use Topics  . Smoking status: Former Smoker -- 0.2 packs/day    Types: Cigarettes, Cigars  . Smokeless tobacco: Never Used  . Alcohol Use: 1.0 oz/week    2 drink(s) per week      Review of Systems  Constitutional: Negative for fever.  Gastrointestinal: Negative for nausea, vomiting and abdominal pain.  Genitourinary: Positive for frequency and hematuria. Negative for dysuria and incomplete emptying.  All other systems reviewed and are negative.    Allergies  Review of patient's allergies indicates no known allergies.  Home Medications   Current Outpatient Rx  Name  Route  Sig  Dispense  Refill  . ACETAMINOPHEN 500 MG PO TABS   Oral   Take 500 mg by mouth every 6 (six) hours as needed. For pain         . LURASIDONE HCL 80 MG PO TABS   Oral   Take 40 mg by mouth daily with breakfast.         . QUETIAPINE FUMARATE 50 MG PO TABS   Oral   Take 50 mg by mouth at bedtime.            BP 119/71  Pulse 67  Temp 97.7 F (36.5 C) (Oral)  Resp 16  SpO2 95%  Physical Exam  Nursing note and vitals reviewed. Constitutional: He is oriented to person, place, and time. He appears well-developed and  well-nourished. No distress.  HENT:  Head: Normocephalic and atraumatic.  Mouth/Throat: Oropharynx is clear and moist.  Eyes: Conjunctivae normal and EOM are normal. Pupils are equal, round, and reactive to light.  Neck: Normal range of motion. Neck supple.  Cardiovascular: Normal rate, regular rhythm and intact distal pulses.   No murmur heard. Pulmonary/Chest: Effort normal and breath sounds normal. No respiratory distress. He has no wheezes. He has no rales.  Abdominal: Soft. Normal appearance. He exhibits no distension. There is no tenderness. There is CVA tenderness. There is no rebound and no guarding.       Mild bilateral CVA tenderness  Genitourinary: Testes normal and penis normal. No penile tenderness. No discharge  found.       Several blood clots in the underwear but no blood at the meatus  Musculoskeletal: Normal range of motion. He exhibits no edema and no tenderness.  Neurological: He is alert and oriented to person, place, and time.  Skin: Skin is warm and dry. No rash noted. No erythema.  Psychiatric: He has a normal mood and affect. His behavior is normal.    ED Course  Procedures (including critical care time)  Labs Reviewed  URINALYSIS, ROUTINE W REFLEX MICROSCOPIC - Abnormal; Notable for the following:    APPearance CLOUDY (*)     Hgb urine dipstick LARGE (*)     All other components within normal limits  CBC WITH DIFFERENTIAL - Abnormal; Notable for the following:    Hemoglobin 12.3 (*)     HCT 35.7 (*)     Neutrophils Relative 42 (*)     All other components within normal limits  COMPREHENSIVE METABOLIC PANEL  URINE MICROSCOPIC-ADD ON   No results found.   1. Hematuria       MDM   Patient with a history of hematuria that's been going on for approximately 1-2 weeks after he had a traumatic catheterization in [place]. He is complaining of urinating blood clots and mild pain in his back. He denies fever, vomiting or abdominal pain. On exam he does have blood clots in his underwear but there is currently no blood at the meatus.  Testicles are within normal limits without any sign of infection. He has no prior history of kidney stones and he has no focal CVA tenderness that would be suggestive of a stone. Feel most likely traumatic catheterization as he describes it he was screaming and moving around the bed when they were attempting to get it in. UA is positive for too numerous to count white blood cells and blood clots. CBC with normal hemoglobin. CMP is pending. Bedside ultrasound shows a decompressed bladder at this time. Will have patient drink fluids to reevaluate for clot in the bladder that may need irrigation.  CMP wnl.  1:38 PM Bladder is now full without  evidence of clot within the bladder. Will give patient followup with urology       Gwyneth Sprout, MD 06/04/12 1246  Gwyneth Sprout, MD 06/04/12 1339  Gwyneth Sprout, MD 06/04/12 1339

## 2012-06-04 NOTE — ED Notes (Signed)
Patient reports frank blood while urinating, clots present. Discomfort during urination. Bilateral flank pain

## 2012-06-07 ENCOUNTER — Encounter: Payer: Self-pay | Admitting: Gastroenterology

## 2012-06-07 ENCOUNTER — Ambulatory Visit (AMBULATORY_SURGERY_CENTER): Payer: Self-pay | Admitting: Gastroenterology

## 2012-06-07 ENCOUNTER — Telehealth: Payer: Self-pay

## 2012-06-07 VITALS — BP 112/65 | HR 55 | Temp 98.0°F | Resp 20 | Ht 75.0 in | Wt 208.0 lb

## 2012-06-07 DIAGNOSIS — K3189 Other diseases of stomach and duodenum: Secondary | ICD-10-CM

## 2012-06-07 DIAGNOSIS — R933 Abnormal findings on diagnostic imaging of other parts of digestive tract: Secondary | ICD-10-CM

## 2012-06-07 DIAGNOSIS — K319 Disease of stomach and duodenum, unspecified: Secondary | ICD-10-CM

## 2012-06-07 MED ORDER — SODIUM CHLORIDE 0.9 % IV SOLN: 500.0000 mL | INTRAVENOUS | Status: DC

## 2012-06-07 NOTE — Telephone Encounter (Signed)
Message copied by Donata Duff on Tue Jun 07, 2012  4:41 PM ------      Message from: Rachael Fee      Created: Tue Jun 07, 2012  4:06 PM      Regarding: RE: EUS       Desire Fulp,      He needs upper EUS, radial +/- linear, ++ propofol, next available EUS Thursday. For proximal gastric subepithelial mass.  Thanks            Kirby Crigler get it scheduled, thanks                  ----- Message -----         From: Donata Duff, CMA         Sent: 06/07/2012   4:00 PM           To: Rachael Fee, MD      Subject: FW: EUS                                                              ----- Message -----         From: Lily Lovings, RN         Sent: 06/07/2012   3:50 PM           To: Rachael Fee, MD, Donata Duff, CMA      Subject: EUS                                                      Dr. Christella Hartigan,            Dr. Arlyce Dice did an EGD on this pt today. He wants the pt to have an EUS.            Please help in scheduling this.            Thanks,      Selinda Michaels

## 2012-06-07 NOTE — Progress Notes (Signed)
1544 a/ox3 pleased with MAC report to [employer]

## 2012-06-07 NOTE — Patient Instructions (Signed)
DR [facility] OFFICE WILL CALL YOU WITH AN APPOINTMENT FOR EUS.  YOU HAD AN ENDOSCOPIC PROCEDURE TODAY AT THE [facility]: Refer to the procedure report that was given to you for any specific questions about what was found during the examination.  If the procedure report does not answer your questions, please call your gastroenterologist to clarify.  If you requested that your care partner not be given the details of your procedure findings, then the procedure report has been included in a sealed envelope for you to review at your convenience later.  YOU SHOULD EXPECT: Some feelings of bloating in the abdomen. Passage of more gas than usual.  Walking can help get rid of the air that was put into your GI tract during the procedure and reduce the bloating. If you had a lower endoscopy (such as a colonoscopy or flexible sigmoidoscopy) you may notice spotting of blood in your stool or on the toilet paper. If you underwent a bowel prep for your procedure, then you may not have a normal bowel movement for a few days.  DIET: Your first meal following the procedure should be a light meal and then it is ok to progress to your normal diet.  A half-sandwich or bowl of soup is an example of a good first meal.  Heavy or fried foods are harder to digest and may make you feel nauseous or bloated.  Likewise meals heavy in dairy and vegetables can cause extra gas to form and this can also increase the bloating.  Drink plenty of fluids but you should avoid alcoholic beverages for 24 hours.  ACTIVITY: Your care partner should take you home directly after the procedure.  You should plan to take it easy, moving slowly for the rest of the day.  You can resume normal activity the day after the procedure however you should NOT DRIVE or use heavy machinery for 24 hours (because of the sedation medicines used during the test).    SYMPTOMS TO REPORT IMMEDIATELY: A gastroenterologist can be reached at any hour.  During  normal business hours, 8:30 AM to 5:00 PM Monday through Friday, call [phone].  After hours and on weekends, please call the GI answering service at [phone] who will take a message and have the physician on call contact you.   Following upper endoscopy (EGD)  Vomiting of blood or coffee ground material  New chest pain or pain under the shoulder blades  Painful or persistently difficult swallowing  New shortness of breath  Fever of 100F or higher  Black, tarry-looking stools  FOLLOW UP: If any biopsies were taken you will be contacted by phone or by letter within the next 1-3 weeks.  Call your gastroenterologist if you have not heard about the biopsies in 3 weeks.  Our staff will call the home number listed on your records the next business day following your procedure to check on you and address any questions or concerns that you may have at that time regarding the information given to you following your procedure. This is a courtesy call and so if there is no answer at the home number and we have not heard from you through the emergency physician on call, we will assume that you have returned to your regular daily activities without incident.  SIGNATURES/CONFIDENTIALITY: You and/or your care partner have signed paperwork which will be entered into your electronic medical record.  These signatures attest to the fact that that the information above on your  After Visit Summary has been reviewed and is understood.  Full responsibility of the confidentiality of this discharge information lies with you and/or your care-partner.

## 2012-06-07 NOTE — Progress Notes (Signed)
Patient did not experience any of the following events: a burn prior to discharge; a fall within the facility; wrong site/side/patient/procedure/implant event; or a hospital transfer or hospital admission upon discharge from the facility. (G8907) Patient did not have preoperative order for IV antibiotic SSI prophylaxis. (G8918)  

## 2012-06-07 NOTE — Progress Notes (Signed)
1552PATIENT WITH EXAGGERATED COUGH. TRANSIENT COMPLAINTS OF NAUSEA.SMALL AMOUNT OF MUCOUS EXPECTORATED.

## 2012-06-07 NOTE — Op Note (Signed)
[facility] [address] [place], [address]   ENDOSCOPY PROCEDURE REPORT  PATIENT: Scott, Farrell  MR#: [id number] BIRTHDATE: January 30, 1981 , 31  yrs. old GENDER: Male ENDOSCOPIST: Louis Meckel, MD REFERRED BY:  Gaynelle Adu, M.D. PROCEDURE DATE:  06/07/2012 PROCEDURE:  EGD, diagnostic ASA CLASS:     Class II INDICATIONS:  abnormal CT of the GI tract. MEDICATIONS: MAC sedation, administered by CRNA, propofol (Diprivan) 150mg  IV, and Robinul 0.2 mg IV TOPICAL ANESTHETIC:  DESCRIPTION OF PROCEDURE: After the risks benefits and alternatives of the procedure were thoroughly explained, informed consent was obtained.  The LB [id number] endoscope was introduced through the mouth and advanced to the third portion of the duodenum. Without limitations.  The instrument was slowly withdrawn as the mucosa was fully examined.      The upper, middle and distal third of the esophagus were carefully inspected and no abnormalities were noted.  The z-line was well seen at the GEJ.  The endoscope was pushed into the fundus which was normal including a retroflexed view.  The antrum, gastric body, first and second part of the duodenum were unremarkable. Retroflexed views revealed no abnormalities.     The scope was then withdrawn from the patient and the procedure completed.  COMPLICATIONS: There were no complications. ENDOSCOPIC IMPRESSION: Normal EGD  RECOMMENDATIONS: EUS  REPEAT EXAM:  eSigned:  Louis Meckel, MD 06/07/2012 3:42 PM   CC:

## 2012-06-08 ENCOUNTER — Telehealth: Payer: Self-pay

## 2012-06-08 ENCOUNTER — Other Ambulatory Visit: Payer: Self-pay

## 2012-06-08 ENCOUNTER — Telehealth: Payer: Self-pay | Admitting: *Deleted

## 2012-06-08 DIAGNOSIS — K3189 Other diseases of stomach and duodenum: Secondary | ICD-10-CM

## 2012-06-08 NOTE — Telephone Encounter (Signed)
Left message on machine to call back regarding EUS scheduled for 06/30/12 130 pm [facility]

## 2012-06-08 NOTE — Telephone Encounter (Signed)
  Follow up Call-  Call back number 06/07/2012  Post procedure Call Back phone  # [phone]  Permission to leave phone message Yes     [facility]

## 2012-06-09 NOTE — Telephone Encounter (Signed)
Pt has been instructed and meds reviewed pt will call with any further questions

## 2012-06-10 ENCOUNTER — Encounter (HOSPITAL_COMMUNITY): Payer: Self-pay | Admitting: Pharmacy Technician

## 2012-06-14 ENCOUNTER — Encounter (HOSPITAL_COMMUNITY): Payer: Self-pay | Admitting: *Deleted

## 2012-06-14 NOTE — Pre-Procedure Instructions (Signed)
Your procedure is scheduled on: Thursday, June 30, 2012 Report to [facility] Admitting [id number] Call this number if you have problems morning of your procedure:[phone]  Follow all bowel prep instructions per your doctor's orders.  Do not eat or drink anything after midnight the night before your procedure. You may brush your teeth, rinse out your mouth, but no water, no food, no chewing gum, no mints, no candies, no chewing tobacco.     Take these medicines the morning of your procedure with A SIP OF WATER: Latuda and Hydrocodone if needed   Please make arrangements for a responsible person to drive you home after the procedure. You cannot go home by cab/taxi. We recommend you have someone with you at home the first 24 hours after your procedure. Driver for procedure is mother Robinette Haines  LEAVE ALL VALUABLES, JEWELRY, BILLFOLD AT HOME.  NO DENTURES, CONTACT LENSES ALLOWED IN THE ENDOSCOPY ROOM.   YOU MAY WEAR DEODORANT, PLEASE REMOVE ALL JEWELRY, WATCHES RINGS, BODY PIERCINGS AND LEAVE AT HOME.   WOMEN: NO MAKE-UP, LOTIONS PERFUMES

## 2012-06-23 ENCOUNTER — Telehealth: Payer: Self-pay | Admitting: Gastroenterology

## 2012-06-24 NOTE — Telephone Encounter (Signed)
Pt has been called to re instruct and make sure he has all the information for the procedure he verbalized understanding and will call back with any further questions

## 2012-06-30 ENCOUNTER — Ambulatory Visit (HOSPITAL_COMMUNITY): Payer: Medicaid Other | Admitting: Certified Registered Nurse Anesthetist

## 2012-06-30 ENCOUNTER — Encounter (HOSPITAL_COMMUNITY)
Admission: RE | Disposition: A | Discharge status: Home / Self Care | Payer: Self-pay | Source: Ambulatory Visit | Attending: Gastroenterology

## 2012-06-30 ENCOUNTER — Encounter (HOSPITAL_COMMUNITY): Payer: Self-pay | Admitting: Certified Registered Nurse Anesthetist

## 2012-06-30 ENCOUNTER — Ambulatory Visit (HOSPITAL_COMMUNITY)
Admission: RE | Admit: 2012-06-30 | Discharge: 2012-06-30 | Disposition: A | Discharge status: Home / Self Care | Payer: Medicaid Other | Source: Ambulatory Visit | Attending: Gastroenterology | Admitting: Gastroenterology

## 2012-06-30 ENCOUNTER — Encounter (HOSPITAL_COMMUNITY): Payer: Self-pay | Admitting: *Deleted

## 2012-06-30 ENCOUNTER — Encounter (HOSPITAL_COMMUNITY): Payer: Self-pay | Admitting: Gastroenterology

## 2012-06-30 DIAGNOSIS — K3189 Other diseases of stomach and duodenum: Secondary | ICD-10-CM

## 2012-06-30 DIAGNOSIS — K319 Disease of stomach and duodenum, unspecified: Secondary | ICD-10-CM

## 2012-06-30 HISTORY — PX: EUS: SHX5427

## 2012-06-30 SURGERY — UPPER ENDOSCOPIC ULTRASOUND (EUS) LINEAR
Anesthesia: Monitor Anesthesia Care

## 2012-06-30 MED ORDER — BUTAMBEN-TETRACAINE-BENZOCAINE 2-2-14 % EX AERO
INHALATION_SPRAY | CUTANEOUS | Status: DC | PRN
  Administered 2012-06-30: 2 via TOPICAL

## 2012-06-30 MED ORDER — PROPOFOL INFUSION 10 MG/ML OPTIME
INTRAVENOUS | Status: DC | PRN
  Administered 2012-06-30: 160 ug/kg/min via INTRAVENOUS

## 2012-06-30 MED ORDER — CIPROFLOXACIN IN D5W 400 MG/200ML IV SOLN
INTRAVENOUS | Status: DC | PRN
  Administered 2012-06-30: 400 mg via INTRAVENOUS

## 2012-06-30 MED ORDER — KETAMINE HCL 10 MG/ML IJ SOLN
INTRAMUSCULAR | Status: DC | PRN
  Administered 2012-06-30: 20 mg via INTRAVENOUS

## 2012-06-30 MED ORDER — CIPROFLOXACIN HCL 500 MG PO TABS: 500.0000 mg | ORAL_TABLET | Freq: Two times a day (BID) | ORAL | Status: DC

## 2012-06-30 MED ORDER — MIDAZOLAM HCL 5 MG/5ML IJ SOLN
INTRAMUSCULAR | Status: DC | PRN
  Administered 2012-06-30: 2 mg via INTRAVENOUS

## 2012-06-30 MED ORDER — FENTANYL CITRATE 0.05 MG/ML IJ SOLN
INTRAMUSCULAR | Status: DC | PRN
  Administered 2012-06-30: 100 ug via INTRAVENOUS

## 2012-06-30 MED ORDER — ONDANSETRON HCL 4 MG/2ML IJ SOLN
INTRAMUSCULAR | Status: DC | PRN
  Administered 2012-06-30: 4 mg via INTRAVENOUS

## 2012-06-30 MED ORDER — LACTATED RINGERS IV SOLN
INTRAVENOUS | Status: DC | PRN
  Administered 2012-06-30: 14:00:00 via INTRAVENOUS

## 2012-06-30 MED ORDER — SODIUM CHLORIDE 0.9 % IV SOLN: INTRAVENOUS | Status: DC

## 2012-06-30 MED ORDER — LIDOCAINE HCL (CARDIAC) 20 MG/ML IV SOLN
INTRAVENOUS | Status: DC | PRN
  Administered 2012-06-30: 100 mg via INTRAVENOUS

## 2012-06-30 MED ORDER — CIPROFLOXACIN IN D5W 400 MG/200ML IV SOLN
INTRAVENOUS | Status: AC
  Filled 2012-06-30: qty 200

## 2012-06-30 NOTE — Op Note (Signed)
[facility] [address] [place], [address]   ENDOSCOPIC ULTRASOUND PROCEDURE REPORT  PATIENT: Scott, Farrell  MR#: [id number] BIRTHDATE: 05-02-1981  GENDER: Male ENDOSCOPIST: Rachael Fee, MD REFERRED BY:  Louis Meckel, M.D. PROCEDURE DATE:  06/30/2012 PROCEDURE:   Upper EUS w/FNA ASA CLASS:      Class II INDICATIONS:   1.  incidentally noted subepithelial lesion at GE juntion (by CT scan done for lower abd pain), this was not visible on recent EGD (Dr.  Arlyce Dice). MEDICATIONS: MAC sedation, administered by CRNA, cipro 400mg  IV  DESCRIPTION OF PROCEDURE:   After the risks benefits and alternatives of the procedure were  explained, informed consent was obtained. The patient was then placed in the left, lateral, decubitus postion and IV sedation was administered. Throughout the procedure, the patients blood pressure, pulse and oxygen saturations were monitored continuously.  Under direct visualization, the Pentax Radial EUS [id number]  endoscope was introduced through the mouth  and advanced to the descending duodenum .  Water was used as necessary to provide an acoustic interface.  Upon completion of the imaging, water was removed and the patient was sent to the recovery room in satisfactory condition.   Endoscopic findings (with radial and linear echoendoscopes): 1. Normal UGI tract.  EUS findings: 1. There was a round very hypoechoic mass at GE junction, measuring 3.3cm across. The mass contains isoechoic foci.  The mass does not show clear communication with wall of stomach or esophagus.  Using a single pass with a 25 guage BS EUS FNA needle I sampled the lesion and the contents proved to be gelatinous by imaging.  I aspirated a small amount of browish, tooth-paste consistency material which was sent for cytology. 2. Limited views of liver, spleen, pancreas were all normal. 3. Echolayering of stomach was normal   Impression: 3.3cm lesion  directly abutting the GE juntion, containing gelatinous, browish material (sample sent to cytology).  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  He will complete 3 days of cipro.    _______________________________ eSigned:  Rachael Fee, MD 06/30/2012 2:16 PM

## 2012-06-30 NOTE — Anesthesia Preprocedure Evaluation (Addendum)
Anesthesia Evaluation  Patient identified by MRN, date of birth, ID band Patient awake    Reviewed: Allergy & Precautions, H&P , NPO status , Patient's Chart, lab work & pertinent test results  Airway Mallampati: II TM Distance: >3 FB Neck ROM: full    Dental No notable dental hx. (+) Teeth Intact and Dental Advisory Given   Pulmonary neg pulmonary ROS, Current Smoker,  breath sounds clear to auscultation  Pulmonary exam normal       Cardiovascular Exercise Tolerance: Good negative cardio ROS  Rhythm:regular Rate:Normal     Neuro/Psych PSYCHIATRIC DISORDERS Schizophrenia negative neurological ROS     GI/Hepatic negative GI ROS, Neg liver ROS,   Endo/Other  negative endocrine ROS  Renal/GU negative Renal ROS  negative genitourinary   Musculoskeletal   Abdominal   Peds  Hematology negative hematology ROS (+)   Anesthesia Other Findings   Reproductive/Obstetrics negative OB ROS                          Anesthesia Physical Anesthesia Plan  ASA: II  Anesthesia Plan: MAC   Post-op Pain Management:    Induction:   Airway Management Planned: Simple Face Mask  Additional Equipment:   Intra-op Plan:   Post-operative Plan:   Informed Consent: I have reviewed the patients History and Physical, chart, labs and discussed the procedure including the risks, benefits and alternatives for the proposed anesthesia with the patient or authorized representative who has indicated his/her understanding and acceptance.   Dental Advisory Given  Plan Discussed with: CRNA and Surgeon  Anesthesia Plan Comments:         Anesthesia Quick Evaluation

## 2012-06-30 NOTE — Preoperative (Signed)
Beta Blockers   Reason not to administer Beta Blockers:Not Applicable 

## 2012-06-30 NOTE — Progress Notes (Signed)
Patient stated he felt lightheaded and started shaking after first IV attempt. Head of bed lowered and iv inserted. Iv Fluids running at bolus rate.

## 2012-06-30 NOTE — H&P (Signed)
  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 Arlyce Dice last month    Past Medical History  Diagnosis Date  . Costochondritis   . 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 Clotting disorder   . Substance abuse   . Schizophrenia     [facility], [place]  . Tobacco use disorder   . Mental disorder     pt. staed, I dont no whatt kind    History reviewed. No pertinent past surgical history.  Current Facility-Administered Medications  Medication Dose Route Frequency Provider Last Rate Last Dose  . 0.9 %  sodium chloride infusion   Intravenous Continuous Rachael Fee, MD        Allergies as of 06/08/2012  . (No Known Allergies)    Family History  Problem Relation Age of Onset  . Hypertension Mother   . Stroke Mother   . Depression Mother   . Acne Mother   . Thyroid disease Mother   . Diabetes Mother   . Colon cancer Mother 70  . Acute lymphoblastic leukemia Father   . Diabetes Father   . Cancer Brother   . Breast cancer Maternal Aunt   . Ovarian cancer Maternal Aunt     History   Social History  . Marital Status: Single    Spouse Name: N/A    Number of Children: N/A  . Years of Education: N/A   Occupational History  . Not on file.   Social History Main Topics  . Smoking status: Former Smoker -- 0.2 packs/day    Types: Cigarettes, Cigars  . Smokeless tobacco: Never Used  . Alcohol Use: No  . Drug Use: No  . 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 Not on file   Social History Narrative  . 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

## 2012-06-30 NOTE — Transfer of Care (Signed)
Immediate Anesthesia Transfer of Care Note  Patient: Scott Farrell  Procedure(s) Performed: Procedure(s) (LRB): UPPER ENDOSCOPIC ULTRASOUND (EUS) LINEAR (N/A)  Patient Location: PACU  Anesthesia Type: MAC  Level of Consciousness: sedated, patient cooperative and responds to stimulaton  Airway & Oxygen Therapy: Patient Spontanous Breathing and Patient connected to face mask oxgen  Post-op Assessment: Report given to PACU RN and Post -op Vital signs reviewed and stable  Post vital signs: Reviewed and stable  Complications: No apparent anesthesia complications

## 2012-07-01 ENCOUNTER — Encounter (HOSPITAL_COMMUNITY): Payer: Self-pay | Admitting: Gastroenterology

## 2012-07-01 NOTE — Anesthesia Postprocedure Evaluation (Signed)
  Anesthesia Post-op Note  Patient: Scott Farrell  Procedure(s) Performed: Procedure(s) (LRB): UPPER ENDOSCOPIC ULTRASOUND (EUS) LINEAR (N/A)  Patient Location: PACU  Anesthesia Type: MAC  Level of Consciousness: awake and alert   Airway and Oxygen Therapy: Patient Spontanous Breathing  Post-op Pain: mild  Post-op Assessment: Post-op Vital signs reviewed, Patient's Cardiovascular Status Stable, Respiratory Function Stable, Patent Airway and No signs of Nausea or vomiting  Last Vitals:  Filed Vitals:   06/30/12 1450  BP: 116/65  Pulse:   Temp:   Resp: 17    Post-op Vital Signs: stable   Complications: No apparent anesthesia complications

## 2012-07-05 ENCOUNTER — Encounter: Payer: Self-pay | Admitting: Internal Medicine

## 2012-07-18 ENCOUNTER — Ambulatory Visit (INDEPENDENT_AMBULATORY_CARE_PROVIDER_SITE_OTHER): Payer: Medicaid Other | Admitting: Medical

## 2012-07-18 ENCOUNTER — Encounter: Payer: Self-pay | Admitting: Medical

## 2012-07-18 ENCOUNTER — Telehealth: Payer: Self-pay | Admitting: Gastroenterology

## 2012-07-18 VITALS — BP 110/70 | HR 58 | Temp 98.1°F | Resp 16 | Ht 76.0 in | Wt 220.0 lb

## 2012-07-18 DIAGNOSIS — Z113 Encounter for screening for infections with a predominantly sexual mode of transmission: Secondary | ICD-10-CM

## 2012-07-18 DIAGNOSIS — Z13220 Encounter for screening for lipoid disorders: Secondary | ICD-10-CM

## 2012-07-18 DIAGNOSIS — K3189 Other diseases of stomach and duodenum: Secondary | ICD-10-CM

## 2012-07-18 DIAGNOSIS — Z0000 Encounter for general adult medical examination without abnormal findings: Secondary | ICD-10-CM

## 2012-07-18 DIAGNOSIS — F209 Schizophrenia, unspecified: Secondary | ICD-10-CM

## 2012-07-18 DIAGNOSIS — K319 Disease of stomach and duodenum, unspecified: Secondary | ICD-10-CM

## 2012-07-18 DIAGNOSIS — N200 Calculus of kidney: Secondary | ICD-10-CM

## 2012-07-18 LAB — POCT URINALYSIS DIPSTICK
Bilirubin, UA: NEGATIVE
Glucose, UA: NEGATIVE
Ketones, UA: NEGATIVE
Leukocytes, UA: NEGATIVE
Nitrite, UA: NEGATIVE
pH, UA: 6

## 2012-07-18 NOTE — Telephone Encounter (Signed)
Dr Arlyce Dice pt Dr Christella Hartigan did an EUS

## 2012-07-18 NOTE — Telephone Encounter (Signed)
Pt states that his PCP suggested he contact Dr. Arlyce Dice to see if he needs to go ahead and have a screening colon due to family history of colon cancer. Pt states his mother was diagnosed with colon ca at the age of 66. Please advise.

## 2012-07-18 NOTE — Progress Notes (Signed)
Subjective:   HPI  Scott Farrell is a 32 y.o. male who presents for a complete physical.   Preventative care: Last ophthalmology visit:No Last dental visit:yes/ Dr.Redd Last colonoscopy:n/a Last prostate exam: n/a Last EKG:11/11/10  Prior vaccinations: TD or Tdap:unsure Influenza:unsure Pneumococcal:n/a Shingles/Zostavax:n/a  Concerns: Been seeing urology recently for renal stone.  Hasn't passed it yet, but recently had CT abdomen with +stone.  He recently had biopsy of gastric mass.  Awaiting results.  Seeing gastroenterology.  Past Medical History  Diagnosis Date  . Costochondritis     history of  . 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 Schizophrenia     [facility], [place] - Dr. Marga Melnick  . Tobacco use disorder   . Mental disorder 2013    hospitalized for psychiatric illness prior  . Normal cardiac stress test 01/07/11    negative treadmill test, no evidence of ischemia, good exercise capacity; Viann Fish, MD;   . History of echocardiogram 01/07/11    normal LV function, EF 60%, trace mitral, tricuspid and pulmonic regurgitation; Dr. Viann Fish  . Clotting disorder     personal hx/o? etiology unclear  . Renal stone 06/2012    Urology consult  . Prostatitis 11/2010    Dr. Vernie Ammons    Past Surgical History  Procedure Laterality Date  . Eus  06/30/2012    Procedure: UPPER ENDOSCOPIC ULTRASOUND (EUS) LINEAR;  Surgeon: Rachael Fee, MD;  Location: [facility];  Service: Endoscopy;  Laterality: N/A;  radial linear    Family History  Problem Relation Age of Onset  . Hypertension Mother   . Stroke Mother   . Depression Mother   . Acne Mother   . Thyroid disease Mother   . Diabetes Mother   . Colon cancer Mother 30  . Acute lymphoblastic leukemia Father   . Diabetes Father   . Cancer Brother   . Breast cancer Maternal Aunt   . Ovarian cancer Maternal Aunt     History   Social History  . Marital Status: Single    Spouse Name: N/A    Number of  Children: N/A  . Years of Education: N/A   Occupational History  . Not on file.   Social History Main Topics  . Smoking status: Former Smoker -- 0.25 packs/day    Types: Cigarettes, Cigars  . Smokeless tobacco: Never Used  . Alcohol Use: No  . Drug Use: No  . Sexually Active: Yes   Other Topics Concern  . Not on file   Social History Narrative   Lives with mother, Marilynne Drivers, exercising with walking, running, no current relationship; unemployed    Current Outpatient Prescriptions on File Prior to Visit  Medication Sig Dispense Refill  . lurasidone (LATUDA) 80 MG TABS Take 40 mg by mouth daily with breakfast.      . QUEtiapine (SEROQUEL) 50 MG tablet Take 50 mg by mouth at bedtime.        No current facility-administered medications on file prior to visit.    No Known Allergies  Reviewed their medical, surgical, family, social, medication, and allergy history and updated chart as appropriate.    Review of Systems Constitutional: -fever, -chills, -sweats, -unexpected weight change, -decreased appetite, +fatigue Allergy: -sneezing, -itching, -congestion Dermatology: -changing moles, --rash, -lumps ENT: -runny nose, -ear pain, -sore throat, -hoarseness, -sinus pain, -teeth pain, - ringing in ears, -hearing loss, -nosebleeds Cardiology: -chest pain, -palpitations, -swelling, -difficulty breathing when lying flat, -waking up short of breath Respiratory: -cough, -shortness of breath, -difficulty  breathing with exercise or exertion, -wheezing, -coughing up blood Gastroenterology: +abdominal pain, -nausea, -vomiting, -diarrhea, -constipation, -blood in stool, -changes in bowel movement, -difficulty swallowing or eating Hematology: -bleeding, -bruising  Musculoskeletal: -joint aches, -muscle aches, -joint swelling, +back pain, +neck pain, +cramping, -changes in gait Ophthalmology: denies vision changes, eye redness, itching, discharge Urology: -burning with urination, +difficulty  urinating, -blood in urine, -urinary frequency, -urgency, -incontinence Neurology: -headache, -weakness, -tingling, -numbness, -memory loss, -falls, -dizziness Psychology: -depressed mood, -agitation, -sleep problems     Objective:   Physical Exam  Filed Vitals:   07/18/12 1424  BP: 110/70  Pulse: 58  Temp: 98.1 F (36.7 C)  Resp: 16    General appearance: alert, no distress, WD/WN, black male Skin: unremarkable, no worrisome lesions HEENT: normocephalic, conjunctiva/corneas normal, sclerae anicteric, PERRLA, EOMi, nares patent, no discharge or erythema, pharynx normal  Oral cavity: MMM, tongue normal, teeth normal  Neck: supple, no lymphadenopathy, no thyromegaly, no masses, normal ROM, no JVD or bruits  Chest: non tender, normal shape and expansion  Heart: RRR, normal S1, S2, no murmurs  Lungs: CTA bilaterally, no wheezes, rhonchi, or rales  Abdomen: + bs, soft, mild generalized tenderness,  non distended, no masses, no hepatomegaly, no splenomegaly, no bruits  Back: non tender, normal ROM, no scoliosis  Musculoskeletal: upper extremities non tender, no obvious deformity, normal ROM throughout, lower extremities non tender, no obvious deformity, normal ROM throughout  GU: mild tenderness of bilat testes, but no rash, no discharge, no hernias, no mass, no lymphadenopathy Extremities: no edema, no cyanosis, no clubbing  Pulses: 2+ symmetric, upper and lower extremities, normal cap refill  Neurological: alert, oriented x 3, CN2-12 intact, strength normal upper extremities and lower extremities, sensation normal throughout, DTRs 2+ throughout, no cerebellar signs, gait normal  Psychiatric: normal affect, behavior normal, pleasant  Rectal: deferred   Assessment and Plan :    Encounter Diagnoses  Name Primary?  . Routine general medical examination at a health care facility Yes  . Screen for STD (sexually transmitted disease)   . Need for lipid screening   . Schizophrenia   .  Renal stone   . Gastric mass     Physical exam - discussed healthy lifestyle, diet, exercise, preventative care, vaccinations, and addressed their concerns.  Reviewed recnet labs in EMR.  STD screening - pt will return for labs  Schizophrenia - followed by psychiatry  Renal stone - gave strainer and sterile cup to collect stone debris.   Hydrate well, call/return here or with urology if worse pain.  Gastric mass - pending pathology from gastroenterology.     Follow-up fasting for labs.

## 2012-07-19 NOTE — Telephone Encounter (Signed)
Left message for pt to call back  °

## 2012-07-19 NOTE — Telephone Encounter (Signed)
He can begin screening anytime between now and 40.

## 2012-07-20 NOTE — Telephone Encounter (Signed)
Spoke with the pt and he is aware, he wanted to go ahead and schedule. Pt scheduled for previsit and colon, he is aware of appt dates and times.

## 2012-07-22 ENCOUNTER — Other Ambulatory Visit: Payer: Medicaid Other

## 2012-07-22 DIAGNOSIS — Z13220 Encounter for screening for lipoid disorders: Secondary | ICD-10-CM

## 2012-07-22 DIAGNOSIS — Z113 Encounter for screening for infections with a predominantly sexual mode of transmission: Secondary | ICD-10-CM

## 2012-07-22 LAB — LIPID PANEL
Cholesterol: 155 mg/dL (ref 0–200)
HDL: 40 mg/dL (ref >39)
LDL Cholesterol: 94 mg/dL (ref 0–99)
Triglycerides: 107 mg/dL (ref <150)

## 2012-07-25 LAB — GC/CHLAMYDIA PROBE AMP, URINE
Chlamydia, Swab/Urine, PCR: NEGATIVE
GC Probe Amp, Urine: NEGATIVE

## 2012-07-27 ENCOUNTER — Other Ambulatory Visit: Payer: Self-pay | Admitting: Medical

## 2012-07-27 MED ORDER — VALACYCLOVIR HCL 500 MG PO TABS: ORAL_TABLET | ORAL | Status: DC

## 2012-08-01 ENCOUNTER — Ambulatory Visit (AMBULATORY_SURGERY_CENTER): Payer: Medicaid Other | Admitting: *Deleted

## 2012-08-01 VITALS — Ht 76.0 in | Wt 221.2 lb

## 2012-08-01 DIAGNOSIS — Z1211 Encounter for screening for malignant neoplasm of colon: Secondary | ICD-10-CM

## 2012-08-01 DIAGNOSIS — Z800 Family history of malignant neoplasm of digestive organs: Secondary | ICD-10-CM

## 2012-08-01 MED ORDER — NA SULFATE-K SULFATE-MG SULF 17.5-3.13-1.6 GM/177ML PO SOLN: 1.0000 | Freq: Once | ORAL | Status: DC

## 2012-08-01 NOTE — Progress Notes (Signed)
No egg or soy allergy. ewm 

## 2012-08-09 ENCOUNTER — Ambulatory Visit (AMBULATORY_SURGERY_CENTER): Payer: Medicaid Other | Admitting: Gastroenterology

## 2012-08-09 ENCOUNTER — Encounter: Payer: Self-pay | Admitting: Gastroenterology

## 2012-08-09 VITALS — BP 114/58 | HR 64 | Temp 97.5°F | Resp 30 | Ht 76.0 in | Wt 221.0 lb

## 2012-08-09 DIAGNOSIS — D126 Benign neoplasm of colon, unspecified: Secondary | ICD-10-CM

## 2012-08-09 DIAGNOSIS — Z800 Family history of malignant neoplasm of digestive organs: Secondary | ICD-10-CM

## 2012-08-09 DIAGNOSIS — Z1211 Encounter for screening for malignant neoplasm of colon: Secondary | ICD-10-CM

## 2012-08-09 MED ORDER — SODIUM CHLORIDE 0.9 % IV SOLN: 500.0000 mL | INTRAVENOUS | Status: DC

## 2012-08-09 NOTE — Op Note (Signed)
[facility] [address] [place], [address]   COLONOSCOPY PROCEDURE REPORT  PATIENT: Koua, Deeg  MR#: [id number] BIRTHDATE: Sep 15, 1980 , 32  yrs. old GENDER: Male ENDOSCOPIST: Louis Meckel, MD REFERRED GE:XBMW Grisso, M.D. PROCEDURE DATE:  08/09/2012 PROCEDURE:   Colonoscopy with snare polypectomy and Colonoscopy with cold biopsy polypectomy ASA CLASS:   Class I INDICATIONS:Patient's immediate family history of colon cancer. MEDICATIONS:  DESCRIPTION OF PROCEDURE:   After the risks benefits and alternatives of the procedure were thoroughly explained, informed consent was obtained.  A digital rectal exam revealed no abnormalities of the rectum.   The LB [id number]-[id number]  endoscope was introduced through the anus and advanced to the cecum, which was identified by both the appendix and ileocecal valve. No adverse events experienced.   The quality of the prep was Suprep excellent The instrument was then slowly withdrawn as the colon was fully examined.      COLON FINDINGS: A flat polyp was found at the cecum.  A polypectomy was performed with a cold snare.  The resection was complete and the polyp tissue was completely retrieved.   A sessile polyp measuring 2-3 mm in size was found in the descending colon.  A polypectomy was performed with cold forceps.  The resection was complete and the polyp tissue was completely retrieved.   The colon mucosa was otherwise normal.  Retroflexed views revealed no abnormalities. The time to cecum=5 minutes 0 seconds.  Withdrawal time=9 minutes 33 seconds.  The scope was withdrawn and the procedure completed. COMPLICATIONS: There were no complications.  ENDOSCOPIC IMPRESSION: 1.   Flat polyp was found at the cecum; polypectomy was performed with a cold snare 2.   Sessile polyp measuring 2-3 mm in size was found in the descending colon; polypectomy was performed with cold forceps 3.   The colon mucosa was  otherwise normal  RECOMMENDATIONS: Given your significant family history of colon cancer, you should have a repeat colonoscopy in 5 years   eSigned:  Louis Meckel, MD 08/09/2012 3:57 PM   cc:   PATIENT NAME:  Adrian, Dinovo MR#: [id number]

## 2012-08-09 NOTE — Progress Notes (Signed)
Patient did not experience any of the following events: a burn prior to discharge; a fall within the facility; wrong site/side/patient/procedure/implant event; or a hospital transfer or hospital admission upon discharge from the facility. (G8907) Patient did not have preoperative order for IV antibiotic SSI prophylaxis. (G8918)  

## 2012-08-09 NOTE — Progress Notes (Signed)
Called to room to assist during endoscopic procedure.  Patient ID and intended procedure confirmed with present staff. Received instructions for my participation in the procedure from the performing physician.  

## 2012-08-09 NOTE — Patient Instructions (Addendum)
YOU HAD AN ENDOSCOPIC PROCEDURE TODAY AT THE [facility]: Refer to the procedure report that was given to you for any specific questions about what was found during the examination.  If the procedure report does not answer your questions, please call your gastroenterologist to clarify.  If you requested that your care partner not be given the details of your procedure findings, then the procedure report has been included in a sealed envelope for you to review at your convenience later.  YOU SHOULD EXPECT: Some feelings of bloating in the abdomen. Passage of more gas than usual.  Walking can help get rid of the air that was put into your GI tract during the procedure and reduce the bloating. If you had a lower endoscopy (such as a colonoscopy or flexible sigmoidoscopy) you may notice spotting of blood in your stool or on the toilet paper. If you underwent a bowel prep for your procedure, then you may not have a normal bowel movement for a few days.  DIET: Your first meal following the procedure should be a light meal and then it is ok to progress to your normal diet.  A half-sandwich or bowl of soup is an example of a good first meal.  Heavy or fried foods are harder to digest and may make you feel nauseous or bloated.  Likewise meals heavy in dairy and vegetables can cause extra gas to form and this can also increase the bloating.  Drink plenty of fluids but you should avoid alcoholic beverages for 24 hours.  ACTIVITY: Your care partner should take you home directly after the procedure.  You should plan to take it easy, moving slowly for the rest of the day.  You can resume normal activity the day after the procedure however you should NOT DRIVE or use heavy machinery for 24 hours (because of the sedation medicines used during the test).    SYMPTOMS TO REPORT IMMEDIATELY: A gastroenterologist can be reached at any hour.  During normal business hours, 8:30 AM to 5:00 PM Monday through Friday,  call [phone].  After hours and on weekends, please call the GI answering service at [phone] who will take a message and have the physician on call contact you.   Following lower endoscopy (colonoscopy or flexible sigmoidoscopy):  Excessive amounts of blood in the stool  Significant tenderness or worsening of abdominal pains  Swelling of the abdomen that is new, acute  Fever of 100F or higher  Following upper endoscopy (EGD)  Vomiting of blood or coffee ground material  New chest pain or pain under the shoulder blades  Painful or persistently difficult swallowing  New shortness of breath  Fever of 100F or higher  Black, tarry-looking stools  FOLLOW UP: If any biopsies were taken you will be contacted by phone or by letter within the next 1-3 weeks.  Call your gastroenterologist if you have not heard about the biopsies in 3 weeks.  Our staff will call the home number listed on your records the next business day following your procedure to check on you and address any questions or concerns that you may have at that time regarding the information given to you following your procedure. This is a courtesy call and so if there is no answer at the home number and we have not heard from you through the emergency physician on call, we will assume that you have returned to your regular daily activities without incident.  SIGNATURES/CONFIDENTIALITY: You and/or your care   partner have signed paperwork which will be entered into your electronic medical record.  These signatures attest to the fact that that the information above on your After Visit Summary has been reviewed and is understood.  Full responsibility of the confidentiality of this discharge information lies with you and/or your care-partner.  

## 2012-08-09 NOTE — Progress Notes (Signed)
Lidocaine-40mg IV prior to Propofol InductionPropofol given over incremental dosages 

## 2012-08-09 NOTE — Progress Notes (Signed)
Pt was recently started on a new Mood stabilizer and he nor his mother could remember the name.

## 2012-08-10 ENCOUNTER — Telehealth: Payer: Self-pay | Admitting: *Deleted

## 2012-08-10 NOTE — Telephone Encounter (Signed)
No answer left message to call if questions or concerns. 

## 2012-08-18 ENCOUNTER — Encounter: Payer: Self-pay | Admitting: Gastroenterology

## 2012-09-20 ENCOUNTER — Emergency Department (HOSPITAL_COMMUNITY)
Admission: EM | Admit: 2012-09-20 | Discharge: 2012-09-20 | Discharge status: Against Medical Advice | Payer: Medicaid Other | Attending: Emergency Medicine | Admitting: Emergency Medicine

## 2012-09-20 ENCOUNTER — Encounter (HOSPITAL_COMMUNITY): Payer: Self-pay | Admitting: *Deleted

## 2012-09-20 DIAGNOSIS — R12 Heartburn: Secondary | ICD-10-CM | POA: Insufficient documentation

## 2012-09-20 DIAGNOSIS — Z87891 Personal history of nicotine dependence: Secondary | ICD-10-CM | POA: Insufficient documentation

## 2012-09-20 DIAGNOSIS — R109 Unspecified abdominal pain: Secondary | ICD-10-CM | POA: Insufficient documentation

## 2012-09-20 LAB — URINALYSIS, ROUTINE W REFLEX MICROSCOPIC
Glucose, UA: NEGATIVE mg/dL
Hgb urine dipstick: NEGATIVE
Leukocytes, UA: NEGATIVE
Protein, ur: NEGATIVE mg/dL
pH: 6.5 (ref 5.0–8.0)

## 2012-09-20 NOTE — ED Notes (Signed)
Pt came to triage window and reported he had to leave.

## 2012-09-20 NOTE — ED Notes (Addendum)
Pt c/o "burning in my chest" x's 3 weeks. Reports "throwing up once in the past 2 weeks." also reports feeling "faint" at certain times of the day. Pt also c/o right sided abd pain.

## 2012-09-23 ENCOUNTER — Encounter (HOSPITAL_COMMUNITY): Payer: Self-pay | Admitting: *Deleted

## 2012-09-23 ENCOUNTER — Emergency Department (HOSPITAL_COMMUNITY): Payer: Medicaid Other

## 2012-09-23 DIAGNOSIS — Z87891 Personal history of nicotine dependence: Secondary | ICD-10-CM | POA: Insufficient documentation

## 2012-09-23 DIAGNOSIS — R05 Cough: Secondary | ICD-10-CM | POA: Insufficient documentation

## 2012-09-23 DIAGNOSIS — A6000 Herpesviral infection of urogenital system, unspecified: Secondary | ICD-10-CM | POA: Insufficient documentation

## 2012-09-23 DIAGNOSIS — R509 Fever, unspecified: Secondary | ICD-10-CM | POA: Insufficient documentation

## 2012-09-23 DIAGNOSIS — F209 Schizophrenia, unspecified: Secondary | ICD-10-CM | POA: Insufficient documentation

## 2012-09-23 DIAGNOSIS — J029 Acute pharyngitis, unspecified: Secondary | ICD-10-CM | POA: Insufficient documentation

## 2012-09-23 DIAGNOSIS — R059 Cough, unspecified: Secondary | ICD-10-CM | POA: Insufficient documentation

## 2012-09-23 DIAGNOSIS — Z87442 Personal history of urinary calculi: Secondary | ICD-10-CM | POA: Insufficient documentation

## 2012-09-23 DIAGNOSIS — J3489 Other specified disorders of nose and nasal sinuses: Secondary | ICD-10-CM | POA: Insufficient documentation

## 2012-09-23 DIAGNOSIS — Z79899 Other long term (current) drug therapy: Secondary | ICD-10-CM | POA: Insufficient documentation

## 2012-09-23 DIAGNOSIS — Z8739 Personal history of other diseases of the musculoskeletal system and connective tissue: Secondary | ICD-10-CM | POA: Insufficient documentation

## 2012-09-23 NOTE — ED Notes (Signed)
Pt c/o cough x 3 weeks, productive with dark green phlegm.  Has been taking Nyquil.  Denies fevers, n/v/d.

## 2012-09-24 ENCOUNTER — Emergency Department (HOSPITAL_COMMUNITY)
Admission: EM | Admit: 2012-09-24 | Discharge: 2012-09-24 | Disposition: A | Discharge status: Home / Self Care | Payer: Medicaid Other | Attending: Emergency Medicine | Admitting: Emergency Medicine

## 2012-09-24 DIAGNOSIS — R059 Cough, unspecified: Secondary | ICD-10-CM

## 2012-09-24 DIAGNOSIS — R05 Cough: Secondary | ICD-10-CM

## 2012-09-24 NOTE — ED Provider Notes (Signed)
History     CSN: [id number]  Arrival date & time 09/23/12  2148   First MD Initiated Contact with Patient 09/24/12 0156      Chief Complaint  Patient presents with  . Cough     Patient is a 32 y.o. male presenting with cough. The history is provided by the patient.  Cough Cough characteristics:  Productive Severity:  Moderate Onset quality:  Gradual Duration:  3 weeks Timing:  Intermittent Progression:  Unchanged Relieved by:  Nothing Worsened by:  Nothing tried Associated symptoms: fever, sinus congestion and sore throat   pt reports cough, congestion for past 3 weeks He reports he produces green/yellow sputum, but one time yesterday his sputum had blood tinged No active CP He reports chills and fever at home  Past Medical History  Diagnosis Date  . Costochondritis     history of  . 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 Schizophrenia     [facility], [place] - Dr. Marga Melnick  . Tobacco use disorder   . Mental disorder 2013    hospitalized for psychiatric illness prior  . Normal cardiac stress test 01/07/11    negative treadmill test, no evidence of ischemia, good exercise capacity; Viann Fish, MD;   . History of echocardiogram 01/07/11    normal LV function, EF 60%, trace mitral, tricuspid and pulmonic regurgitation; Dr. Viann Fish  . Clotting disorder     personal hx/o? etiology unclear  . Renal stone 06/2012    Urology consult  . Prostatitis 11/2010    Dr. Vernie Ammons    Past Surgical History  Procedure Laterality Date  . Eus  06/30/2012    Procedure: UPPER ENDOSCOPIC ULTRASOUND (EUS) LINEAR;  Surgeon: Rachael Fee, MD;  Location: [facility];  Service: Endoscopy;  Laterality: N/A;  radial linear  . Wisdom tooth extraction      Family History  Problem Relation Age of Onset  . Hypertension Mother   . Stroke Mother   . Depression Mother   . Acne Mother   . Thyroid disease Mother   . Diabetes Mother   . Colon cancer Mother 84  . Acute lymphoblastic  leukemia Father   . Diabetes Father   . Cancer Brother   . Breast cancer Maternal Aunt   . Ovarian cancer Maternal Aunt     History  Substance Use Topics  . Smoking status: Former Smoker -- 0.25 packs/day    Types: Cigarettes, Cigars  . Smokeless tobacco: Never Used  . Alcohol Use: No     Comment: no use now      Review of Systems  Constitutional: Positive for fever.  HENT: Positive for sore throat.   Respiratory: Positive for cough.     Allergies  Review of patient's allergies indicates no known allergies.  Home Medications   Current Outpatient Rx  Name  Route  Sig  Dispense  Refill  . ARIPiprazole (ABILIFY) 10 MG tablet   Oral   Take 10 mg by mouth daily.         . divalproex (DEPAKOTE) 500 MG DR tablet   Oral   Take 1,000 mg by mouth at bedtime.         . valACYclovir (VALTREX) 500 MG tablet   Oral   Take 500 mg by mouth 2 (two) times daily.           BP 120/67  Pulse 53  Temp(Src) 97.9 F (36.6 C) (Oral)  Resp 26  SpO2 98%  Physical Exam CONSTITUTIONAL:  Well developed/well nourished HEAD: Normocephalic/atraumatic EYES: EOMI/PERRL ENMT: Mucous membranes moist, uvula midline, pharynx non-erythematous.  Voice not muffled No blood in mouth or nares NECK: supple no meningeal signs SPINE:entire spine nontender CV: S1/S2 noted, no murmurs/rubs/gallops noted LUNGS: Lungs are clear to auscultation bilaterally, no apparent distress ABDOMEN: soft, nontender, no rebound or guarding GU:no cva tenderness NEURO: Pt is awake/alert, moves all extremitiesx4 EXTREMITIES: pulses normal, full ROM SKIN: warm, color normal PSYCH:flat affect  ED Course  Procedures (including critical care time)  Labs Reviewed - No data to display Dg Chest 2 View  09/24/2012  *RADIOLOGY REPORT*  Clinical Data: Coughing up blood in the morning for 2 weeks. Smoker.  CHEST - 2 VIEW  Comparison: CT abdomen and pelvis 07/06/2012  Findings: Normal inspiration. The heart size and  pulmonary vascularity are normal. The lungs appear clear and expanded without focal air space disease or consolidation. No blunting of the costophrenic angles.  No pneumothorax.  Mediastinal contours appear intact.  IMPRESSION: No evidence of active pulmonary disease.   Original Report Authenticated By: Burman Nieves, M.D.      1. Cough     Pt resting comfortably, no distress, lung sounds clear, CXR negative.  No hypoxia noted.   He is not tachypneic on my exam  Likely uri.  Defer further testing.  Advised f/u with PCP MDM  Nursing notes including past medical history and social history reviewed and considered in documentation xrays reviewed and considered     Date: 09/24/2012  Rate: 59  Rhythm: sinus bradycardia  QRS Axis: normal  Intervals: normal  ST/T Wave abnormalities: nonspecific ST changes and J point elevation  Conduction Disutrbances:none  Narrative Interpretation:   Old EKG Reviewed: none available at time of interpretation          Joya Gaskins, MD 09/24/12 [phone]

## 2012-09-28 ENCOUNTER — Telehealth: Payer: Self-pay | Admitting: Family Medicine

## 2012-09-28 ENCOUNTER — Other Ambulatory Visit: Payer: Self-pay | Admitting: Medical

## 2012-09-28 MED ORDER — VALACYCLOVIR HCL 500 MG PO TABS: ORAL_TABLET | ORAL | Status: DC

## 2012-09-28 NOTE — Telephone Encounter (Signed)
Pt called and needs refill on Valtrex to [facility] on [address]

## 2013-01-26 ENCOUNTER — Telehealth: Payer: Self-pay | Admitting: Internal Medicine

## 2013-01-26 NOTE — Telephone Encounter (Signed)
Faxed over medical records to [facility] div of [facility] @ [phone]

## 2013-10-26 ENCOUNTER — Encounter (HOSPITAL_COMMUNITY): Payer: Self-pay | Admitting: Emergency Medicine

## 2013-10-26 ENCOUNTER — Emergency Department (HOSPITAL_COMMUNITY)
Admission: EM | Admit: 2013-10-26 | Discharge: 2013-10-27 | Disposition: A | Discharge status: Home / Self Care | Payer: Medicaid Other | Attending: Emergency Medicine | Admitting: Emergency Medicine

## 2013-10-26 DIAGNOSIS — F209 Schizophrenia, unspecified: Secondary | ICD-10-CM | POA: Insufficient documentation

## 2013-10-26 DIAGNOSIS — S335XXA Sprain of ligaments of lumbar spine, initial encounter: Secondary | ICD-10-CM | POA: Insufficient documentation

## 2013-10-26 DIAGNOSIS — Y9367 Activity, basketball: Secondary | ICD-10-CM | POA: Insufficient documentation

## 2013-10-26 DIAGNOSIS — Z87448 Personal history of other diseases of urinary system: Secondary | ICD-10-CM | POA: Insufficient documentation

## 2013-10-26 DIAGNOSIS — Z8739 Personal history of other diseases of the musculoskeletal system and connective tissue: Secondary | ICD-10-CM | POA: Insufficient documentation

## 2013-10-26 DIAGNOSIS — Z862 Personal history of diseases of the blood and blood-forming organs and certain disorders involving the immune mechanism: Secondary | ICD-10-CM | POA: Insufficient documentation

## 2013-10-26 DIAGNOSIS — Z79899 Other long term (current) drug therapy: Secondary | ICD-10-CM | POA: Insufficient documentation

## 2013-10-26 DIAGNOSIS — F17200 Nicotine dependence, unspecified, uncomplicated: Secondary | ICD-10-CM | POA: Insufficient documentation

## 2013-10-26 DIAGNOSIS — X500XXA Overexertion from strenuous movement or load, initial encounter: Secondary | ICD-10-CM | POA: Insufficient documentation

## 2013-10-26 DIAGNOSIS — R1110 Vomiting, unspecified: Secondary | ICD-10-CM | POA: Insufficient documentation

## 2013-10-26 DIAGNOSIS — Z87442 Personal history of urinary calculi: Secondary | ICD-10-CM | POA: Insufficient documentation

## 2013-10-26 DIAGNOSIS — Z8619 Personal history of other infectious and parasitic diseases: Secondary | ICD-10-CM | POA: Insufficient documentation

## 2013-10-26 DIAGNOSIS — Y92838 Other recreation area as the place of occurrence of the external cause: Secondary | ICD-10-CM

## 2013-10-26 DIAGNOSIS — Y9239 Other specified sports and athletic area as the place of occurrence of the external cause: Secondary | ICD-10-CM | POA: Insufficient documentation

## 2013-10-26 DIAGNOSIS — S39012A Strain of muscle, fascia and tendon of lower back, initial encounter: Secondary | ICD-10-CM

## 2013-10-26 LAB — URINALYSIS, ROUTINE W REFLEX MICROSCOPIC
Glucose, UA: NEGATIVE mg/dL
HGB URINE DIPSTICK: NEGATIVE
Ketones, ur: NEGATIVE mg/dL
Leukocytes, UA: NEGATIVE
Nitrite: NEGATIVE
PROTEIN: 30 mg/dL — AB
Specific Gravity, Urine: 1.029 (ref 1.005–1.030)
UROBILINOGEN UA: 1 mg/dL (ref 0.0–1.0)
pH: 6 (ref 5.0–8.0)

## 2013-10-26 LAB — URINE MICROSCOPIC-ADD ON

## 2013-10-26 NOTE — ED Notes (Signed)
Pt is c/o left flank pain and groin pain  Pt states he is having a lot of pressure to his back and groin area   Pt states he was playing basketball tonight went up for a shot and when he came back down the pain started  Pt states he had a similar event about 3 weeks ago when he was mowing the yard

## 2013-10-27 MED ORDER — KETOROLAC TROMETHAMINE 60 MG/2ML IM SOLN
60.0000 mg | Freq: Once | INTRAMUSCULAR | Status: AC
  Administered 2013-10-27: 60 mg via INTRAMUSCULAR
  Filled 2013-10-27: qty 2

## 2013-10-27 MED ORDER — METHOCARBAMOL 500 MG PO TABS: 500.0000 mg | ORAL_TABLET | Freq: Two times a day (BID) | ORAL | Status: DC

## 2013-10-27 MED ORDER — IBUPROFEN 600 MG PO TABS: 600.0000 mg | ORAL_TABLET | Freq: Four times a day (QID) | ORAL | Status: DC | PRN

## 2013-10-27 MED ORDER — METHOCARBAMOL 500 MG PO TABS
1000.0000 mg | ORAL_TABLET | Freq: Once | ORAL | Status: AC
  Administered 2013-10-27: 1000 mg via ORAL
  Filled 2013-10-27: qty 2

## 2013-10-27 NOTE — ED Notes (Signed)
Dc home, instructions and rx given.

## 2013-10-27 NOTE — ED Provider Notes (Signed)
CSN: [id number]     Arrival date & time 10/26/13  2222 History   First MD Initiated Contact with Patient 10/27/13 0131     Chief Complaint  Patient presents with  . Flank Pain     (Consider location/radiation/quality/duration/timing/severity/associated sxs/prior Treatment) HPI Patient presents with right sided lumbar pain that radiates around to his right thigh. This going on for 3 weeks. It was worsened tonight while playing basketball. He denies any urinary symptoms. He denies any focal weakness or numbness. Patient denies any bowel or bladder incontinence. The pain is worse with movement. Past Medical History  Diagnosis Date  . Costochondritis     history of  . 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 Schizophrenia     [facility], [place] - Dr. Lennon Alstrom  . Tobacco use disorder   . Mental disorder 2013    hospitalized for psychiatric illness prior  . Normal cardiac stress test 01/07/11    negative treadmill test, no evidence of ischemia, good exercise capacity; Tollie Eth, MD;   . History of echocardiogram 01/07/11    normal LV function, EF 60%, trace mitral, tricuspid and pulmonic regurgitation; Dr. Tollie Eth  . Clotting disorder     personal hx/o? etiology unclear  . Renal stone 06/2012    Urology consult  . Prostatitis 11/2010    Dr. Karsten Ro   Past Surgical History  Procedure Laterality Date  . Eus  06/30/2012    Procedure: UPPER ENDOSCOPIC ULTRASOUND (EUS) LINEAR;  Surgeon: Milus Banister, MD;  Location: [facility];  Service: Endoscopy;  Laterality: N/A;  radial linear  . Wisdom tooth extraction     Family History  Problem Relation Age of Onset  . Hypertension Mother   . Stroke Mother   . Depression Mother   . Acne Mother   . Thyroid disease Mother   . Diabetes Mother   . Colon cancer Mother 59  . Acute lymphoblastic leukemia Father   . Diabetes Father   . Cancer Brother   . Breast cancer Maternal Aunt   . Ovarian cancer Maternal Aunt    History   Substance Use Topics  . Smoking status: Current Every Day Smoker -- 0.25 packs/day    Types: Cigarettes, Cigars  . Smokeless tobacco: Never Used  . Alcohol Use: Yes     Comment: occ    Review of Systems  Constitutional: Negative for fever and chills.  Respiratory: Negative for shortness of breath.   Cardiovascular: Negative for chest pain.  Gastrointestinal: Positive for vomiting. Negative for nausea and abdominal pain.  Genitourinary: Negative for dysuria, frequency, hematuria, flank pain, penile swelling, scrotal swelling and testicular pain.  Musculoskeletal: Positive for back pain and myalgias. Negative for neck pain and neck stiffness.  Skin: Negative for rash and wound.  Neurological: Negative for dizziness, weakness and numbness.  All other systems reviewed and are negative.     Allergies  Review of patient's allergies indicates no known allergies.  Home Medications   Prior to Admission medications   Medication Sig Start Date End Date Taking? Authorizing Provider  divalproex (DEPAKOTE ER) 250 MG 24 hr tablet Take 750 mg by mouth at bedtime.   Yes Historical Provider, MD  OLANZapine (ZYPREXA) 5 MG tablet Take 5 mg by mouth at bedtime.   Yes Historical Provider, MD  traZODone (DESYREL) 50 MG tablet Take 50 mg by mouth at bedtime.   Yes Historical Provider, MD  ibuprofen (ADVIL,MOTRIN) 600 MG tablet Take 1 tablet (600 mg total) by mouth every  6 (six) hours as needed. 10/27/13   Julianne Rice, MD  methocarbamol (ROBAXIN) 500 MG tablet Take 1 tablet (500 mg total) by mouth 2 (two) times daily. 10/27/13   Julianne Rice, MD   BP 116/73  Pulse 67  Temp(Src) 97.5 F (36.4 C) (Oral)  Resp 20  SpO2 97% Physical Exam  Nursing note and vitals reviewed. Constitutional: He is oriented to person, place, and time. He appears well-developed and well-nourished. No distress.  HENT:  Head: Normocephalic and atraumatic.  Mouth/Throat: Oropharynx is clear and moist.  Eyes: EOM are  normal. Pupils are equal, round, and reactive to light.  Neck: Normal range of motion. Neck supple.  Cardiovascular: Normal rate and regular rhythm.   Pulmonary/Chest: Effort normal and breath sounds normal. No respiratory distress. He has no wheezes. He has no rales. He exhibits no tenderness.  Abdominal: Soft. Bowel sounds are normal. He exhibits no distension and no mass. There is no tenderness. There is no rebound and no guarding.  Musculoskeletal: Normal range of motion. He exhibits tenderness (patient with tenderness to palpation in the right lumbar paraspinal muscles. Negative straight leg raise.). He exhibits no edema.  Mild tenderness to palpation in the lateral right quadriceps. There is no evidence of any trauma. Distal pulses intact.  Neurological: He is alert and oriented to person, place, and time.  No sensory or motor deficit. Patient is ambulatory in the emergency department.  Skin: Skin is warm and dry. No rash noted. No erythema.  Psychiatric: He has a normal mood and affect. His behavior is normal.    ED Course  Procedures (including critical care time) Labs Review Labs Reviewed  URINALYSIS, ROUTINE W REFLEX MICROSCOPIC - Abnormal; Notable for the following:    Bilirubin Urine SMALL (*)    Protein, ur 30 (*)    All other components within normal limits  URINE MICROSCOPIC-ADD ON - Abnormal; Notable for the following:    Casts GRANULAR CAST (*)    All other components within normal limits    Imaging Review No results found.   EKG Interpretation None      MDM   Final diagnoses:  Strain of lumbar paraspinal muscle    Clinically patient has lumbar strain. No concerning signs for cauda equina syndrome. We'll treat with anti-inflammatories and muscle relaxants. Return precautions have been given.   Julianne Rice, MD 10/27/13 [phone]

## 2013-10-27 NOTE — Discharge Instructions (Signed)

## 2013-11-06 DIAGNOSIS — N200 Calculus of kidney: Secondary | ICD-10-CM | POA: Diagnosis not present

## 2013-11-27 ENCOUNTER — Emergency Department (HOSPITAL_COMMUNITY): Payer: Medicaid Other

## 2013-11-27 ENCOUNTER — Encounter (HOSPITAL_COMMUNITY): Payer: Self-pay | Admitting: Emergency Medicine

## 2013-11-27 ENCOUNTER — Emergency Department (HOSPITAL_COMMUNITY)
Admission: EM | Admit: 2013-11-27 | Discharge: 2013-11-27 | Disposition: A | Discharge status: Home / Self Care | Payer: Self-pay | Attending: Emergency Medicine | Admitting: Emergency Medicine

## 2013-11-27 DIAGNOSIS — Z8739 Personal history of other diseases of the musculoskeletal system and connective tissue: Secondary | ICD-10-CM | POA: Insufficient documentation

## 2013-11-27 DIAGNOSIS — Z862 Personal history of diseases of the blood and blood-forming organs and certain disorders involving the immune mechanism: Secondary | ICD-10-CM | POA: Insufficient documentation

## 2013-11-27 DIAGNOSIS — Z79899 Other long term (current) drug therapy: Secondary | ICD-10-CM | POA: Insufficient documentation

## 2013-11-27 DIAGNOSIS — N200 Calculus of kidney: Secondary | ICD-10-CM | POA: Diagnosis not present

## 2013-11-27 DIAGNOSIS — B009 Herpesviral infection, unspecified: Secondary | ICD-10-CM | POA: Insufficient documentation

## 2013-11-27 DIAGNOSIS — A6000 Herpesviral infection of urogenital system, unspecified: Secondary | ICD-10-CM

## 2013-11-27 DIAGNOSIS — Z8719 Personal history of other diseases of the digestive system: Secondary | ICD-10-CM | POA: Insufficient documentation

## 2013-11-27 DIAGNOSIS — F209 Schizophrenia, unspecified: Secondary | ICD-10-CM | POA: Insufficient documentation

## 2013-11-27 DIAGNOSIS — Z87442 Personal history of urinary calculi: Secondary | ICD-10-CM | POA: Insufficient documentation

## 2013-11-27 DIAGNOSIS — R1031 Right lower quadrant pain: Secondary | ICD-10-CM

## 2013-11-27 LAB — URINALYSIS, ROUTINE W REFLEX MICROSCOPIC
BILIRUBIN URINE: NEGATIVE
Glucose, UA: NEGATIVE mg/dL
HGB URINE DIPSTICK: NEGATIVE
Ketones, ur: NEGATIVE mg/dL
Leukocytes, UA: NEGATIVE
Nitrite: NEGATIVE
PH: 5.5 (ref 5.0–8.0)
Protein, ur: NEGATIVE mg/dL
SPECIFIC GRAVITY, URINE: 1.031 — AB (ref 1.005–1.030)
Urobilinogen, UA: 0.2 mg/dL (ref 0.0–1.0)

## 2013-11-27 LAB — BASIC METABOLIC PANEL
BUN: 13 mg/dL (ref 6–23)
CO2: 25 mEq/L (ref 19–32)
Calcium: 9.5 mg/dL (ref 8.4–10.5)
Chloride: 103 mEq/L (ref 96–112)
Creatinine, Ser: 0.9 mg/dL (ref 0.50–1.35)
GFR calc Af Amer: >90 mL/min (ref >90)
GLUCOSE: 93 mg/dL (ref 70–99)
POTASSIUM: 3.8 meq/L (ref 3.7–5.3)
Sodium: 139 mEq/L (ref 137–147)

## 2013-11-27 LAB — CBC WITH DIFFERENTIAL/PLATELET
Basophils Absolute: 0 10*3/uL (ref 0.0–0.1)
Basophils Relative: 1 % (ref 0–1)
EOS ABS: 0.1 10*3/uL (ref 0.0–0.7)
EOS PCT: 2 % (ref 0–5)
HCT: 40.1 % (ref 39.0–52.0)
HEMOGLOBIN: 13.7 g/dL (ref 13.0–17.0)
LYMPHS ABS: 2.4 10*3/uL (ref 0.7–4.0)
Lymphocytes Relative: 54 % — ABNORMAL HIGH (ref 12–46)
MCH: 29.7 pg (ref 26.0–34.0)
MCHC: 34.2 g/dL (ref 30.0–36.0)
MCV: 86.8 fL (ref 78.0–100.0)
MONOS PCT: 8 % (ref 3–12)
Monocytes Absolute: 0.4 10*3/uL (ref 0.1–1.0)
NEUTROS PCT: 35 % — AB (ref 43–77)
Neutro Abs: 1.6 10*3/uL — ABNORMAL LOW (ref 1.7–7.7)
Platelets: 181 10*3/uL (ref 150–400)
RBC: 4.62 MIL/uL (ref 4.22–5.81)
RDW: 14 % (ref 11.5–15.5)
WBC: 4.4 10*3/uL (ref 4.0–10.5)

## 2013-11-27 MED ORDER — VALACYCLOVIR HCL 1 G PO TABS: 1000.0000 mg | ORAL_TABLET | Freq: Two times a day (BID) | ORAL | Status: AC

## 2013-11-27 MED ORDER — IOHEXOL 300 MG/ML  SOLN
25.0000 mL | INTRAMUSCULAR | Status: AC
  Administered 2013-11-27: 25 mL via ORAL

## 2013-11-27 MED ORDER — IBUPROFEN 800 MG PO TABS: 800.0000 mg | ORAL_TABLET | Freq: Three times a day (TID) | ORAL | Status: DC

## 2013-11-27 MED ORDER — DICYCLOMINE HCL 20 MG PO TABS: 20.0000 mg | ORAL_TABLET | Freq: Two times a day (BID) | ORAL | Status: DC

## 2013-11-27 MED ORDER — IOHEXOL 300 MG/ML  SOLN
100.0000 mL | Freq: Once | INTRAMUSCULAR | Status: AC | PRN
  Administered 2013-11-27: 100 mL via INTRAVENOUS

## 2013-11-27 MED ORDER — SODIUM CHLORIDE 0.9 % IV BOLUS (SEPSIS)
1000.0000 mL | Freq: Once | INTRAVENOUS | Status: AC
  Administered 2013-11-27: 1000 mL via INTRAVENOUS

## 2013-11-27 NOTE — ED Notes (Signed)
Patient presents with c/o "gentialia issues" for about 2 1/2 years and for the last 3 months has had pain to the right flank and RLQ.  Difficulty with urination

## 2013-11-27 NOTE — ED Notes (Signed)
Patient transported to CT 

## 2013-11-27 NOTE — Discharge Instructions (Signed)
Abdominal Pain Many things can cause abdominal pain. Usually, abdominal pain is not caused by a disease and will improve without treatment. It can often be observed and treated at home. Your health care provider will do a physical exam and possibly order blood tests and X-rays to help determine the seriousness of your pain. However, in many cases, more time must pass before a clear cause of the pain can be found. Before that point, your health care provider may not know if you need more testing or further treatment. HOME CARE INSTRUCTIONS  Monitor your abdominal pain for any changes. The following actions may help to alleviate any discomfort you are experiencing:  Only take over-the-counter or prescription medicines as directed by your health care provider.  Do not take laxatives unless directed to do so by your health care provider.  Try a clear liquid diet (broth, tea, or water) as directed by your health care provider. Slowly move to a bland diet as tolerated. SEEK MEDICAL CARE IF:  You have unexplained abdominal pain.  You have abdominal pain associated with nausea or diarrhea.  You have pain when you urinate or have a bowel movement.  You experience abdominal pain that wakes you in the night.  You have abdominal pain that is worsened or improved by eating food.  You have abdominal pain that is worsened with eating fatty foods.  You have a fever. SEEK IMMEDIATE MEDICAL CARE IF:   Your pain does not go away within 2 hours.  You keep throwing up (vomiting).  Your pain is felt only in portions of the abdomen, such as the right side or the left lower portion of the abdomen.  You pass bloody or black tarry stools. MAKE SURE YOU:  Understand these instructions.   Will watch your condition.   Will get help right away if you are not doing well or get worse.  Document Released: 02/25/2005 Document Revised: 05/23/2013 Document Reviewed: 01/25/2013 [facility] Patient Information  2015 [place], [place]. This information is not intended to replace advice given to you by your health care provider. Make sure you discuss any questions you have with your health care provider.  Genital Herpes Genital herpes is a sexually transmitted disease. This means that it is a disease passed by having sex with an infected person. There is no cure for genital herpes. The time between attacks can be months to years. The virus may live in a person but produce no problems (symptoms). This infection can be passed to a baby as it travels down the birth canal (vagina). In a newborn, this can cause central nervous system damage, eye damage, or even death. The virus that causes genital herpes is usually HSV-2 virus. The virus that causes oral herpes is usually HSV-1. The diagnosis (learning what is wrong) is made through culture results. SYMPTOMS  Usually symptoms of pain and itching begin a few days to a week after contact. It first appears as small blisters that progress to small painful ulcers which then scab over and heal after several days. It affects the outer genitalia, birth canal, cervix, penis, anal area, buttocks, and thighs. HOME CARE INSTRUCTIONS   Keep ulcerated areas dry and clean.  Take medications as directed. Antiviral medications can speed up healing. They will not prevent recurrences or cure this infection. These medications can also be taken for suppression if there are frequent recurrences.  While the infection is active, it is contagious. Avoid all sexual contact during active infections.  Condoms may help  prevent spread of the herpes virus.  Practice safe sex.  Wash your hands thoroughly after touching the genital area.  Avoid touching your eyes after touching your genital area.  Inform your caregiver if you have had genital herpes and become pregnant. It is your responsibility to insure a safe outcome for your baby in this pregnancy.  Only take over-the-counter or  prescription medicines for pain, discomfort, or fever as directed by your caregiver. SEEK MEDICAL CARE IF:   You have a recurrence of this infection.  You do not respond to medications and are not improving.  You have new sources of pain or discharge which have changed from the original infection.  You have an oral temperature above 102 F (38.9 C).  You develop abdominal pain.  You develop eye pain or signs of eye infection. Document Released: 05/15/2000 Document Revised: 08/10/2011 Document Reviewed: 06/05/2009 [facility] Patient Information 2015 [place], [place]. This information is not intended to replace advice given to you by your health care provider. Make sure you discuss any questions you have with your health care provider.

## 2013-11-27 NOTE — ED Provider Notes (Addendum)
CSN: [id number]     Arrival date & time 11/27/13  1900 History   First MD Initiated Contact with Patient 11/27/13 2037     Chief Complaint  Patient presents with  . SEXUALLY TRANSMITTED DISEASE     (Consider location/radiation/quality/duration/timing/severity/associated sxs/prior Treatment) HPI Comments: Patient presents with multiple complaints. Patient reports that he has had intermittent episodes of outbreaks of sores on his penis for the last 2-1/2 years. 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 He does not take any medications for these outbreaks. He recently had one, area is currently feeding, but still present. Reports that they're painful at present, but do completely go away.  Patient also complaining of right-sided abdominal pain. This has been ongoing for the last few months. He has had increased pain in the last couple of days. Pain is in the right lower cautery that worsens with movements of his body. He denies injury to the area.   Past Medical History  Diagnosis Date  . Costochondritis     history of  . 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 Schizophrenia     [facility], [place] - Dr. Lennon Alstrom  . Tobacco use disorder   . Mental disorder 2013    hospitalized for psychiatric illness prior  . Normal cardiac stress test 01/07/11    negative treadmill test, no evidence of ischemia, good exercise capacity; Tollie Eth, MD;   . History of echocardiogram 01/07/11    normal LV function, EF 60%, trace mitral, tricuspid and pulmonic regurgitation; Dr. Tollie Eth  . Clotting disorder     personal hx/o? etiology unclear  . Renal stone 06/2012    Urology consult  . Prostatitis 11/2010    Dr. Karsten Ro   Past Surgical History  Procedure Laterality Date  . Eus  06/30/2012    Procedure: UPPER ENDOSCOPIC ULTRASOUND (EUS) LINEAR;  Surgeon: Milus Banister, MD;  Location: [facility];  Service: Endoscopy;  Laterality: N/A;  radial linear  . Wisdom tooth extraction      Family History  Problem Relation Age of Onset  . Hypertension Mother   . Stroke Mother   . Depression Mother   . Acne Mother   . Thyroid disease Mother   . Diabetes Mother   . Colon cancer Mother 13  . Acute lymphoblastic leukemia Father   . Diabetes Father   . Cancer Brother   . Breast cancer Maternal Aunt   . Ovarian cancer Maternal Aunt    History  Substance Use Topics  . Smoking status: Current Every Day Smoker -- 0.25 packs/day    Types: Cigarettes, Cigars  . Smokeless tobacco: Never Used  . Alcohol Use: Yes     Comment: occ    Review of Systems  Gastrointestinal: Positive for abdominal pain.  Genitourinary: Positive for penile pain.  All other systems reviewed and are negative.     Allergies  Review of patient's allergies indicates no known allergies.  Home Medications   Prior to Admission medications   Medication Sig Start Date End Date Taking? Authorizing Provider  divalproex (DEPAKOTE ER) 250 MG 24 hr tablet Take 750 mg by mouth at bedtime.   Yes Historical Provider, MD  ibuprofen (ADVIL,MOTRIN) 600 MG tablet Take 600 mg by mouth every 6 (six) hours as needed for moderate pain. 10/27/13  Yes Julianne Rice, MD  OLANZapine (ZYPREXA) 5 MG tablet Take 5 mg by mouth at bedtime.   Yes Historical Provider, MD  traZODone (DESYREL) 50 MG tablet  Take 50 mg by mouth at bedtime.   Yes Historical Provider, MD   BP 123/73  Pulse 57  Temp(Src) 98.1 F (36.7 C) (Oral)  Resp 25  Ht 6\' 4"  (1.93 m)  Wt 215 lb (97.523 kg)  BMI 26.18 kg/m2  SpO2 99% Physical Exam  Constitutional: He is oriented to person, place, and time. He appears well-developed and well-nourished. No distress.  HENT:  Head: Normocephalic and atraumatic.  Right Ear: Hearing normal.  Left Ear: Hearing normal.  Nose: Nose normal.  Mouth/Throat: Oropharynx is clear and moist and mucous membranes are normal.  Eyes: Conjunctivae and EOM are normal. Pupils are equal, round, and reactive to light.   Neck: Normal range of motion. Neck supple.  Cardiovascular: Regular rhythm, S1 normal and S2 normal.  Exam reveals no gallop and no friction rub.   No murmur heard. Pulmonary/Chest: Effort normal and breath sounds normal. No respiratory distress. He exhibits no tenderness.  Abdominal: Soft. Normal appearance and bowel sounds are normal. There is no hepatosplenomegaly. There is tenderness in the right lower quadrant. There is no rebound, no guarding, no tenderness at McBurney's point and negative Murphy's sign. No hernia.  Genitourinary: Testes normal.    Right testis shows no mass and no tenderness. Left testis shows no mass and no tenderness.  Musculoskeletal: Normal range of motion.  Neurological: He is alert and oriented to person, place, and time. He has normal strength. No cranial nerve deficit or sensory deficit. Coordination normal. GCS eye subscore is 4. GCS verbal subscore is 5. GCS motor subscore is 6.  Skin: Skin is warm, dry and intact. No rash noted. No cyanosis.  Psychiatric: He has a normal mood and affect. His speech is normal and behavior is normal. Thought content normal.    ED Course  Procedures (including critical care time) Labs Review Labs Reviewed  URINALYSIS, ROUTINE W REFLEX MICROSCOPIC - Abnormal; Notable for the following:    Specific Gravity, Urine 1.031 (*)    All other components within normal limits  CBC WITH DIFFERENTIAL - Abnormal; Notable for the following:    Neutrophils Relative % 35 (*)    Neutro Abs 1.6 (*)    Lymphocytes Relative 54 (*)    All other components within normal limits  BASIC METABOLIC PANEL    Imaging Review No results found.   EKG Interpretation None      MDM   Final diagnoses:  None   genital herpes  Abdominal pain  Presents to the emergency department for evaluation of 2 separate complaints. Patient complaining of a rash on his penis which comes out intermittently. He has previously been diagnosed with herpes. She  does have some fading lesions on the shaft of his penis, no fresh outbreaks seen. No other abnormalities seen on the exam. Will begin a prescription for Valtrex to be used as needed symptomatically.  Via a right lower quadrant pain. He does have tenderness without obvious peritonitis. Low suspicion for appendicitis based on the length of time that the pain has been present, but could have an abscess, mass. He also could not tell me of the pain he is experiencing today to go to the ER is different than what he is having, acute appendicitis superimposed on a chronic pain problem was considered as well. The CT scan was performed, no evidence of acute appendicitis. This will be treated symptomatically.    Orpah Greek, MD 11/27/13 1308  Orpah Greek, MD 11/27/13 2308

## 2013-11-27 NOTE — ED Notes (Signed)
Pt drank all of ct po contrast

## 2014-03-12 ENCOUNTER — Emergency Department (HOSPITAL_COMMUNITY)
Admission: EM | Admit: 2014-03-12 | Discharge: 2014-03-13 | Disposition: A | Discharge status: Home / Self Care | Payer: Self-pay | Attending: Emergency Medicine | Admitting: Emergency Medicine

## 2014-03-12 DIAGNOSIS — R109 Unspecified abdominal pain: Secondary | ICD-10-CM

## 2014-03-12 DIAGNOSIS — R1110 Vomiting, unspecified: Secondary | ICD-10-CM | POA: Insufficient documentation

## 2014-03-12 DIAGNOSIS — R509 Fever, unspecified: Secondary | ICD-10-CM | POA: Insufficient documentation

## 2014-03-12 DIAGNOSIS — R10811 Right upper quadrant abdominal tenderness: Secondary | ICD-10-CM | POA: Insufficient documentation

## 2014-03-12 DIAGNOSIS — K5900 Constipation, unspecified: Secondary | ICD-10-CM | POA: Insufficient documentation

## 2014-03-12 DIAGNOSIS — Z720 Tobacco use: Secondary | ICD-10-CM | POA: Insufficient documentation

## 2014-03-12 DIAGNOSIS — R51 Headache: Secondary | ICD-10-CM | POA: Insufficient documentation

## 2014-03-12 DIAGNOSIS — F209 Schizophrenia, unspecified: Secondary | ICD-10-CM | POA: Insufficient documentation

## 2014-03-12 DIAGNOSIS — Z87442 Personal history of urinary calculi: Secondary | ICD-10-CM | POA: Insufficient documentation

## 2014-03-12 DIAGNOSIS — Z862 Personal history of diseases of the blood and blood-forming organs and certain disorders involving the immune mechanism: Secondary | ICD-10-CM | POA: Insufficient documentation

## 2014-03-12 DIAGNOSIS — Z8739 Personal history of other diseases of the musculoskeletal system and connective tissue: Secondary | ICD-10-CM | POA: Insufficient documentation

## 2014-03-12 DIAGNOSIS — Z87448 Personal history of other diseases of urinary system: Secondary | ICD-10-CM | POA: Insufficient documentation

## 2014-03-12 DIAGNOSIS — Z8619 Personal history of other infectious and parasitic diseases: Secondary | ICD-10-CM | POA: Insufficient documentation

## 2014-03-13 ENCOUNTER — Emergency Department (HOSPITAL_COMMUNITY): Payer: Medicaid Other

## 2014-03-13 ENCOUNTER — Encounter (HOSPITAL_COMMUNITY): Payer: Self-pay | Admitting: Emergency Medicine

## 2014-03-13 DIAGNOSIS — R109 Unspecified abdominal pain: Secondary | ICD-10-CM | POA: Diagnosis not present

## 2014-03-13 DIAGNOSIS — R1110 Vomiting, unspecified: Secondary | ICD-10-CM | POA: Diagnosis not present

## 2014-03-13 LAB — URINALYSIS, ROUTINE W REFLEX MICROSCOPIC
Bilirubin Urine: NEGATIVE
GLUCOSE, UA: NEGATIVE mg/dL
HGB URINE DIPSTICK: NEGATIVE
Ketones, ur: NEGATIVE mg/dL
Leukocytes, UA: NEGATIVE
Nitrite: NEGATIVE
PH: 6 (ref 5.0–8.0)
PROTEIN: NEGATIVE mg/dL
SPECIFIC GRAVITY, URINE: 1.022 (ref 1.005–1.030)
Urobilinogen, UA: 0.2 mg/dL (ref 0.0–1.0)

## 2014-03-13 LAB — CBC WITH DIFFERENTIAL/PLATELET
BASOS ABS: 0 10*3/uL (ref 0.0–0.1)
BASOS PCT: 1 % (ref 0–1)
EOS PCT: 3 % (ref 0–5)
Eosinophils Absolute: 0.2 10*3/uL (ref 0.0–0.7)
HEMATOCRIT: 39 % (ref 39.0–52.0)
Hemoglobin: 12.7 g/dL — ABNORMAL LOW (ref 13.0–17.0)
LYMPHS PCT: 51 % — AB (ref 12–46)
Lymphs Abs: 2.8 10*3/uL (ref 0.7–4.0)
MCH: 27.9 pg (ref 26.0–34.0)
MCHC: 32.6 g/dL (ref 30.0–36.0)
MCV: 85.7 fL (ref 78.0–100.0)
Monocytes Absolute: 0.5 10*3/uL (ref 0.1–1.0)
Monocytes Relative: 9 % (ref 3–12)
NEUTROS ABS: 2 10*3/uL (ref 1.7–7.7)
Neutrophils Relative %: 36 % — ABNORMAL LOW (ref 43–77)
PLATELETS: 167 10*3/uL (ref 150–400)
RBC: 4.55 MIL/uL (ref 4.22–5.81)
RDW: 14.2 % (ref 11.5–15.5)
WBC: 5.6 10*3/uL (ref 4.0–10.5)

## 2014-03-13 LAB — COMPREHENSIVE METABOLIC PANEL
ALT: 23 U/L (ref 0–53)
AST: 33 U/L (ref 0–37)
Albumin: 4 g/dL (ref 3.5–5.2)
Alkaline Phosphatase: 66 U/L (ref 39–117)
Anion gap: 14 (ref 5–15)
BUN: 16 mg/dL (ref 6–23)
CO2: 24 mEq/L (ref 19–32)
Calcium: 9.1 mg/dL (ref 8.4–10.5)
Chloride: 101 mEq/L (ref 96–112)
Creatinine, Ser: 0.98 mg/dL (ref 0.50–1.35)
GFR calc Af Amer: >90 mL/min (ref >90)
GFR calc non Af Amer: >90 mL/min (ref >90)
Glucose, Bld: 91 mg/dL (ref 70–99)
Potassium: 4.2 mEq/L (ref 3.7–5.3)
SODIUM: 139 meq/L (ref 137–147)
Total Bilirubin: 0.3 mg/dL (ref 0.3–1.2)
Total Protein: 7.5 g/dL (ref 6.0–8.3)

## 2014-03-13 MED ORDER — RANITIDINE HCL 150 MG/10ML PO SYRP
150.0000 mg | ORAL_SOLUTION | Freq: Once | ORAL | Status: AC
  Administered 2014-03-13: 150 mg via ORAL
  Filled 2014-03-13: qty 10

## 2014-03-13 MED ORDER — ALUM & MAG HYDROXIDE-SIMETH 200-200-20 MG/5ML PO SUSP
30.0000 mL | Freq: Once | ORAL | Status: AC
  Administered 2014-03-13: 30 mL via ORAL
  Filled 2014-03-13: qty 30

## 2014-03-13 NOTE — Discharge Instructions (Signed)
Abdominal Pain [name], you were seen today for abdominal pain. Your labs, urine, and ultrasound were all normal. Followup with a primary care physician within 3 days for continued treatment.  If his symptoms worsen come back to emergency department for evaluation. Thank you. Many things can cause belly (abdominal) pain. Most times, the belly pain is not dangerous. Many cases of belly pain can be watched and treated at home. HOME CARE   Do not take medicines that help you go poop (laxatives) unless told to by your doctor.  Only take medicine as told by your doctor.  Eat or drink as told by your doctor. Your doctor will tell you if you should be on a special diet. GET HELP IF:  You do not know what is causing your belly pain.  You have belly pain while you are sick to your stomach (nauseous) or have runny poop (diarrhea).  You have pain while you pee or poop.  Your belly pain wakes you up at night.  You have belly pain that gets worse or better when you eat.  You have belly pain that gets worse when you eat fatty foods.  You have a fever. GET HELP RIGHT AWAY IF:   The pain does not go away within 2 hours.  You keep throwing up (vomiting).  The pain changes and is only in the right or left part of the belly.  You have bloody or tarry looking poop. MAKE SURE YOU:   Understand these instructions.  Will watch your condition.  Will get help right away if you are not doing well or get worse. Document Released: 11/04/2007 Document Revised: 05/23/2013 Document Reviewed: 01/25/2013 [facility] Patient Information 2015 [place], [place]. This information is not intended to replace advice given to you by your health care provider. Make sure you discuss any questions you have with your health care provider.  Emergency Department Resource Guide 1) Find a Doctor and Pay Out of Pocket Although you won't have to find out who is covered by your insurance plan, it is a good idea to ask  around and get recommendations. You will then need to call the office and see if the doctor you have chosen will accept you as a new patient and what types of options they offer for patients who are self-pay. Some doctors offer discounts or will set up payment plans for their patients who do not have insurance, but you will need to ask so you aren't surprised when you get to your appointment.  2) Contact Your Local Health Department Not all health departments have doctors that can see patients for sick visits, but many do, so it is worth a call to see if yours does. If you don't know where your local health department is, you can check in your phone book. The CDC also has a tool to help you locate your state's health department, and many state websites also have listings of all of their local health departments.  3) Find a [facility] If your illness is not likely to be very severe or complicated, you may want to try a walk in clinic. These are popping up all over the country in pharmacies, drugstores, and shopping centers. They're usually staffed by nurse practitioners or physician assistants that have been trained to treat common illnesses and complaints. They're usually fairly quick and inexpensive. However, if you have serious medical issues or chronic medical problems, these are probably not your best option.  No Primary Care Doctor: -  Call Health Connect at  [phone] - they can help you locate a primary care doctor that  accepts your insurance, provides certain services, etc. - Physician Referral Service- [phone]  Chronic Pain Problems: Organization         Address  Phone   Notes  [facility]  [phone] Patients need to be referred by their primary care doctor.   Medication Assistance: Organization         Address  Phone   Notes  [facility] Medication [facility] [address] [place], [place] [address] [phone] --Must be a  resident of [facility] -- Must have NO insurance coverage whatsoever (no Medicaid/ Medicare, etc.) -- The pt. MUST have a primary care doctor that directs their care regularly and follows them in the community   MedAssist  [phone]   [employer]  [phone]    Agencies that provide inexpensive medical care: Organization         Address  Phone   Notes  [place]  [phone]   [facility]    [phone]   [facility] [place], [place] [address] [phone]   [place] [address] [place] [phone]   Planned Parenthood    [phone]   [facility]    [phone]   [place] and [place] [address], [place] Phone:  [phone], Fax:  [phone] Hours of Operation:  9 am - 6 pm, M-F.  Also accepts Medicaid/Medicare and self-pay.  [facility] for [place], [address], [place] Phone: [phone], Fax: [phone]. Hours of Operation:  8:30 am - 5:30 pm, M-F.  Also accepts Medicaid and self-pay.  [facility] [place] [address], [place] Phone: [phone]   [place], [place], [place] [phone], Ext. [phone] Mondays & Thursdays: 7-9 AM.  First 15 patients are seen on a first come, first serve basis.    [place] Providers:  Organization         Address  Phone   Notes  [facility] [address], [place] [phone] Also accepts self-pay patients.  [facility] [address], [place]  [phone]   [place], [address], [place] [phone]   [facility] [address], [place] [phone]   Lucianne Lei [address], [place]   [phone] Only accepts  [place] Access [place] patients after they have their name applied to their card.   Self-Pay (no insurance) in [facility]:  Organization         Address  Phone   Notes  Sickle Cell Patients, [facility] [place] [phone]   [facility] Urgent Care [place] [phone]   [facility] [place]  [place], [place], [place] [phone]   [facility]/Dr. Osei-Bonsu  [address] [place] or [address], [place] [phone] Phone number for both [place] and [place] locations is the same.  Urgent Medical and [facility] [address] [place] [phone]   [place]  [address], [place] or [address] [phone]   [facility] [place] [phone], phone; [phone], fax Sees patients 1st and 3rd Saturday of every month.  Must not qualify for public or private insurance (i.e. Medicaid, Medicare, [place] Health Choice, Veterans' Benefits)  Household income should be no more than 200% of the poverty level The clinic cannot treat you if you are pregnant or think you are pregnant  Sexually transmitted diseases are not treated at the clinic.    Dental Care: Organization         Address  Phone  Notes  [facility] Department of [facility] [place] [phone] Accepts children up to age 26 who are enrolled in [place] or [place]; pregnant women with a Medicaid card; and children who have applied for Medicaid or [place] Health Choice, but were declined, whose parents can pay a reduced fee at time of service.  [facility] Department of [facility]  [address], [place] [phone] Accepts children up to age 53 who are enrolled in [place] or [place]; pregnant women  with a Medicaid card; and children who have applied for Medicaid or [place] Health Choice, but were declined, whose parents can pay a reduced fee at time of service.  [place] Adult Dental Access PROGRAM  [place] [phone] Patients are seen by appointment only. Walk-ins are not accepted. [place] will see patients 101 years of age and older. Monday - Tuesday (8am-5pm) Most Wednesdays (8:30-5pm) $30 per visit, cash only  [facility] Adult Dental Access PROGRAM  [address], [facility] [place] [phone] Patients are seen by appointment only. Walk-ins are not accepted. [place] will see patients 25 years of age and older. One Wednesday Evening (Monthly: Volunteer Based).  $30 per visit, cash only  [place]  [phone] for adults; Children under age 5, call Graduate Pediatric Dentistry at [phone]. Children aged 6-14, please call [phone] to request a pediatric application.  Dental services are provided in all areas of dental care including fillings, crowns and bridges, complete and partial dentures, implants, gum treatment, root canals, and extractions. Preventive care is also provided. Treatment is provided to both adults and children. Patients are selected via a lottery and there is often a waiting list.   [facility] [address], [place]  [phone] [email or web address]   [facility] [address] [place], [place] [phone], Ext. [phone] Second and Fourth Thursday of each month, opens at 6:30 AM; Clinic ends at 9 AM.  Patients are seen on a first-come first-served basis, and a limited number are seen during each clinic.   [facility]  [address] Hillard Danker [place], [place] [phone]   Eligibility Requirements You must have lived in [place], [place], or [place] counties for at least the last three months.   You cannot be eligible for state or federal sponsored [employer], including [employer], [place], or [employer].   You generally cannot be eligible for healthcare insurance through your employer.    How to apply: Eligibility screenings are held every Tuesday and Wednesday afternoon from 1:00 pm until 4:00 pm. You do not need an appointment for the interview!  [facility] [address] [place], [place]   [facility]  Department  [place] Department  [place]  [phone]    Behavioral Health Resources in the Community: Intensive Outpatient Programs Organization         Address  Phone  Notes  [place]. [address] [place], [place] [phone]   [facility] Outpatient [address] [place], [place]   ADS: Alcohol & Drug Svcs [address], [place], [place]   [place] [address]  [place], [place] or [phone]   Substance Abuse Resources Organization         Address  Phone  Notes  Alcohol and Drug Services  [phone]   [place]  [phone]   [facility] [place]   Chinita Pester  [phone]   Residential & Outpatient Substance Abuse Program  [phone]   Psychological Services Organization         Address  Phone  Notes  [facility] [place]  [place]  [phone]   [place] [address], [place] or [phone]    Mobile Crisis Teams Organization         Address  Phone  Notes  [facility], Mobile Crisis Care Unit  [phone]   [facility]  [address] [place], [place]   [place] [address] [place] [phone]    Self-Help/Support Groups Organization         Address  Phone             Notes  [place]. of [place] - variety of support groups  [place] Call for more information  Narcotics Anonymous (NA), [facility] [address], [employer] [place]  2 meetings at this location   Special educational needs teacher         Address  Phone  Notes  [facility] Residential Treatment [place],    [place]  [phone]   [facility]  [address] [place] [phone], [place], [place]   [place], [place] [phone] Admissions: 8am-3pm M-F  Incentives Substance [place] [address],    [place], [place] [phone]   [facility] [address] [place], [place], [place]   [facility] [address],  [place], [place]   [facility] Programs - Intensive Outpatient [address] Kristeen [address], [place], [place]   [facility] [place].,  [place], [place] [phone] or [phone]   [facility] Services (RTS) [address] [place], [place] Accepts Medicaid  [facility] [place] [address],  [place] [phone] Substance Abuse/Addiction Treatment   [facility] Organization         Address  Phone  Notes  [facility]  [phone]   Domenic Schwab, PhD [address] Arlis Porta [place], [place]   [phone] or [phone]   [place] [address] [place], [place] [phone]   [place] [address] [place], [place] [phone] Insurance/Medicaid/sponsorship through [employer] and Families [address], [address] [address]  [place], [place] [phone] [place], [place] [phone]    Dr. Adele Schilder  [phone]   [facility] Dept. 1) [address]  [place] 2) [place] 3)  [place] [address], [place] [phone]  [phone]   [place] [phone] or [phone] (After Hours)

## 2014-03-13 NOTE — ED Provider Notes (Signed)
CSN: [id number]     Arrival date & time 03/12/14  2336 History   First MD Initiated Contact with Patient 03/13/14 0259     Chief Complaint  Patient presents with  . Fatigue     (Consider location/radiation/quality/duration/timing/severity/associated sxs/prior Treatment) HPI  Scott Farrell is a 33 y.o. male with past medical history of schizophrenia coming in with vomiting. Patient states he has bilateral upper quadrant abdominal pain. He feels like there are knots in his stomach in his only described as pain. He states he has vomited a few times as well. He does not know how long this has been going on for her. She felt lightheaded today at work and he was advised to come to emergency department. He admits to hematuria but no dysuria or change in frequency, he states he's been constipated his last bowel movement was yesterday. He also has a headache, he said this is chronic headache is no different. This is not the worse headache of his life. He has had subjective fevers as well. He denies chest pain or shortness of breath. Nothing has made his symptoms better or worse. Patient has no further complaints.   10 Systems reviewed and are negative for acute change except as noted in the HPI.    Past Medical History  Diagnosis Date  . Costochondritis     history of  . 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 Schizophrenia     [facility] [place] - Dr. Lennon Alstrom  . Tobacco use disorder   . Mental disorder 2013    hospitalized for psychiatric illness prior  . Normal cardiac stress test 01/07/11    negative treadmill test, no evidence of ischemia, good exercise capacity; Tollie Eth, MD;   . History of echocardiogram 01/07/11    normal LV function, EF 60%, trace mitral, tricuspid and pulmonic regurgitation; Dr. Tollie Eth  . Clotting disorder     personal hx/o? etiology unclear  . Renal stone 06/2012    Urology consult  . Prostatitis 11/2010    Dr. Karsten Ro   Past Surgical History   Procedure Laterality Date  . Eus  06/30/2012    Procedure: UPPER ENDOSCOPIC ULTRASOUND (EUS) LINEAR;  Surgeon: Milus Banister, MD;  Location: [facility];  Service: Endoscopy;  Laterality: N/A;  radial linear  . Wisdom tooth extraction     Family History  Problem Relation Age of Onset  . Hypertension Mother   . Stroke Mother   . Depression Mother   . Acne Mother   . Thyroid disease Mother   . Diabetes Mother   . Colon cancer Mother 42  . Acute lymphoblastic leukemia Father   . Diabetes Father   . Cancer Brother   . Breast cancer Maternal Aunt   . Ovarian cancer Maternal Aunt    History  Substance Use Topics  . Smoking status: Current Every Day Smoker -- 0.25 packs/day    Types: Cigarettes, Cigars  . Smokeless tobacco: Never Used  . Alcohol Use: Yes     Comment: occ    Review of Systems    Allergies  Review of patient's allergies indicates no known allergies.  Home Medications   Prior to Admission medications   Medication Sig Start Date End Date Taking? Authorizing Provider  divalproex (DEPAKOTE ER) 250 MG 24 hr tablet Take 750 mg by mouth at bedtime.   Yes Historical Provider, MD  HYDROCODONE-ACETAMINOPHEN PO Take 1 tablet by mouth every 6 (six) hours as needed (for pain).   Yes Historical  Provider, MD  OLANZapine (ZYPREXA) 5 MG tablet Take 5 mg by mouth at bedtime.   Yes Historical Provider, MD  traZODone (DESYREL) 50 MG tablet Take 50 mg by mouth at bedtime.   Yes Historical Provider, MD   BP 132/77  Pulse 63  Temp(Src) 98.5 F (36.9 C) (Oral)  Resp 18  Ht 6\' 4"  (1.93 m)  Wt 230 lb (104.327 kg)  BMI 28.01 kg/m2  SpO2 97% Physical Exam  Nursing note and vitals reviewed. Constitutional: He is oriented to person, place, and time. Vital signs are normal. He appears well-developed and well-nourished.  Non-toxic appearance. He does not appear ill. No distress.  HENT:  Head: Normocephalic and atraumatic.  Nose: Nose normal.  Mouth/Throat: Oropharynx is clear  and moist. No oropharyngeal exudate.  Eyes: Conjunctivae and EOM are normal. Pupils are equal, round, and reactive to light. No scleral icterus.  Neck: Normal range of motion. Neck supple. No tracheal deviation, no edema, no erythema and normal range of motion present. No mass and no thyromegaly present.  Cardiovascular: Normal rate, regular rhythm, S1 normal, S2 normal, normal heart sounds, intact distal pulses and normal pulses.  Exam reveals no gallop and no friction rub.   No murmur heard. Pulses:      Radial pulses are 2+ on the right side, and 2+ on the left side.       Dorsalis pedis pulses are 2+ on the right side, and 2+ on the left side.  Pulmonary/Chest: Effort normal and breath sounds normal. No respiratory distress. He has no wheezes. He has no rhonchi. He has no rales.  Abdominal: Soft. Normal appearance and bowel sounds are normal. He exhibits no distension, no ascites and no mass. There is no hepatosplenomegaly. There is tenderness. There is no rebound, no guarding and no CVA tenderness.  Right upper quadrant tenderness to palpation. Negative Murphy sign.  Musculoskeletal: Normal range of motion. He exhibits no edema and no tenderness.  Lymphadenopathy:    He has no cervical adenopathy.  Neurological: He is alert and oriented to person, place, and time. He has normal strength. No cranial nerve deficit or sensory deficit. He exhibits normal muscle tone. GCS eye subscore is 4. GCS verbal subscore is 5. GCS motor subscore is 6.  Skin: Skin is warm, dry and intact. No petechiae and no rash noted. He is not diaphoretic. No erythema. No pallor.  Psychiatric: He has a normal mood and affect. His behavior is normal. Judgment normal.    ED Course  Procedures (including critical care time) Labs Review Labs Reviewed  CBC WITH DIFFERENTIAL - Abnormal; Notable for the following:    Hemoglobin 12.7 (*)    Neutrophils Relative % 36 (*)    Lymphocytes Relative 51 (*)    All other  components within normal limits  COMPREHENSIVE METABOLIC PANEL  URINALYSIS, ROUTINE W REFLEX MICROSCOPIC    Imaging Review US Abdomen Limited  03/13/2014   CLINICAL DATA:  Abdominal pain and vomiting.  Initial encounter.  EXAM: US ABDOMEN LIMITED - RIGHT UPPER QUADRANT  COMPARISON:  None.  FINDINGS: Gallbladder:  No gallstones or wall thickening visualized. No sonographic Murphy sign noted.  Common bile duct:  Diameter: 2 mm. Where visualized, no filling defect.  Liver:  No focal lesion identified. Within normal limits in parenchymal echogenicity. Antegrade flow in the imaged portal venous system.  IMPRESSION: Normal right upper quadrant ultrasound.   Electronically Signed   By: Jorje Guild M.D.   On: 03/13/2014 04:51  EKG Interpretation None      MDM   Final diagnoses:  None     patient presents emergency department for abdominal pain. His pain seems to be in the right upper quadrant and has had vomiting as well. Ultrasound did not reveal any pathology of the liver or gallbladder. He was given Zantac and Maalox for treatment as well. Patient was given IV fluids. There is no hematuria seen on urinalysis. Patient will be advised to followup as primary care physician within 3 days regarding abdominal pain. Vital signs remain within his normal limits is safe for discharge.    Everlene Balls, MD 03/13/14 [phone]

## 2014-03-13 NOTE — ED Notes (Signed)
Pt c/o generalized malaise for 2 days; pt states that he has had nausea with no vomiting; pt states "it feels like I got a fever"; pt afebrile in the ER; pt states that he needs a refill of his medication while he is here as well; pt states "I got them filled here last time"

## 2014-03-22 DIAGNOSIS — R1084 Generalized abdominal pain: Secondary | ICD-10-CM | POA: Diagnosis not present

## 2014-03-27 ENCOUNTER — Other Ambulatory Visit: Payer: Self-pay | Admitting: Family Medicine

## 2014-03-27 DIAGNOSIS — K429 Umbilical hernia without obstruction or gangrene: Secondary | ICD-10-CM

## 2014-03-29 ENCOUNTER — Ambulatory Visit
Admission: RE | Admit: 2014-03-29 | Discharge: 2014-03-29 | Disposition: A | Discharge status: Home / Self Care | Payer: No Typology Code available for payment source | Source: Ambulatory Visit | Attending: Family Medicine | Admitting: Family Medicine

## 2014-03-29 DIAGNOSIS — K429 Umbilical hernia without obstruction or gangrene: Secondary | ICD-10-CM

## 2014-03-29 MED ORDER — IOHEXOL 300 MG/ML  SOLN
125.0000 mL | Freq: Once | INTRAMUSCULAR | Status: AC | PRN
  Administered 2014-03-29: 125 mL via INTRAVENOUS

## 2014-04-10 DIAGNOSIS — K5900 Constipation, unspecified: Secondary | ICD-10-CM | POA: Diagnosis not present

## 2014-04-10 DIAGNOSIS — K429 Umbilical hernia without obstruction or gangrene: Secondary | ICD-10-CM | POA: Diagnosis not present

## 2014-04-10 DIAGNOSIS — R1030 Lower abdominal pain, unspecified: Secondary | ICD-10-CM | POA: Diagnosis not present

## 2014-04-10 DIAGNOSIS — F329 Major depressive disorder, single episode, unspecified: Secondary | ICD-10-CM | POA: Diagnosis not present

## 2014-04-17 DIAGNOSIS — R1030 Lower abdominal pain, unspecified: Secondary | ICD-10-CM | POA: Diagnosis not present

## 2014-04-17 DIAGNOSIS — F329 Major depressive disorder, single episode, unspecified: Secondary | ICD-10-CM | POA: Diagnosis not present

## 2014-04-17 DIAGNOSIS — K5900 Constipation, unspecified: Secondary | ICD-10-CM | POA: Diagnosis not present

## 2014-04-17 DIAGNOSIS — J309 Allergic rhinitis, unspecified: Secondary | ICD-10-CM | POA: Diagnosis not present

## 2014-07-24 DIAGNOSIS — F2081 Schizophreniform disorder: Secondary | ICD-10-CM | POA: Diagnosis not present

## 2014-09-13 DIAGNOSIS — F2081 Schizophreniform disorder: Secondary | ICD-10-CM | POA: Diagnosis not present

## 2014-09-19 ENCOUNTER — Encounter (HOSPITAL_COMMUNITY): Payer: Self-pay

## 2014-09-19 ENCOUNTER — Emergency Department (HOSPITAL_COMMUNITY)
Admission: EM | Admit: 2014-09-19 | Discharge: 2014-09-19 | Disposition: A | Discharge status: Home / Self Care | Payer: Self-pay | Attending: Emergency Medicine | Admitting: Emergency Medicine

## 2014-09-19 DIAGNOSIS — Z720 Tobacco use: Secondary | ICD-10-CM | POA: Insufficient documentation

## 2014-09-19 DIAGNOSIS — Z862 Personal history of diseases of the blood and blood-forming organs and certain disorders involving the immune mechanism: Secondary | ICD-10-CM | POA: Insufficient documentation

## 2014-09-19 DIAGNOSIS — Z87442 Personal history of urinary calculi: Secondary | ICD-10-CM | POA: Insufficient documentation

## 2014-09-19 DIAGNOSIS — Z9114 Patient's other noncompliance with medication regimen: Secondary | ICD-10-CM | POA: Diagnosis present

## 2014-09-19 DIAGNOSIS — R45851 Suicidal ideations: Secondary | ICD-10-CM | POA: Diagnosis present

## 2014-09-19 DIAGNOSIS — F250 Schizoaffective disorder, bipolar type: Secondary | ICD-10-CM | POA: Diagnosis present

## 2014-09-19 DIAGNOSIS — Z87448 Personal history of other diseases of urinary system: Secondary | ICD-10-CM | POA: Insufficient documentation

## 2014-09-19 DIAGNOSIS — Z8619 Personal history of other infectious and parasitic diseases: Secondary | ICD-10-CM | POA: Insufficient documentation

## 2014-09-19 DIAGNOSIS — R51 Headache: Secondary | ICD-10-CM | POA: Insufficient documentation

## 2014-09-19 DIAGNOSIS — Z8739 Personal history of other diseases of the musculoskeletal system and connective tissue: Secondary | ICD-10-CM | POA: Insufficient documentation

## 2014-09-19 DIAGNOSIS — F209 Schizophrenia, unspecified: Secondary | ICD-10-CM | POA: Diagnosis not present

## 2014-09-19 DIAGNOSIS — F200 Paranoid schizophrenia: Secondary | ICD-10-CM | POA: Diagnosis not present

## 2014-09-19 DIAGNOSIS — Z79899 Other long term (current) drug therapy: Secondary | ICD-10-CM | POA: Insufficient documentation

## 2014-09-19 LAB — COMPREHENSIVE METABOLIC PANEL
ALT: 28 U/L (ref 0–53)
AST: 34 U/L (ref 0–37)
Albumin: 5 g/dL (ref 3.5–5.2)
Alkaline Phosphatase: 67 U/L (ref 39–117)
Anion gap: 7 (ref 5–15)
BILIRUBIN TOTAL: 0.3 mg/dL (ref 0.3–1.2)
BUN: 20 mg/dL (ref 6–23)
CHLORIDE: 106 mmol/L (ref 96–112)
CO2: 25 mmol/L (ref 19–32)
CREATININE: 0.95 mg/dL (ref 0.50–1.35)
Calcium: 9.5 mg/dL (ref 8.4–10.5)
GFR calc Af Amer: >90 mL/min (ref >90)
Glucose, Bld: 119 mg/dL — ABNORMAL HIGH (ref 70–99)
Potassium: 3.7 mmol/L (ref 3.5–5.1)
SODIUM: 138 mmol/L (ref 135–145)
Total Protein: 8.4 g/dL — ABNORMAL HIGH (ref 6.0–8.3)

## 2014-09-19 LAB — CBC
HCT: 41.8 % (ref 39.0–52.0)
Hemoglobin: 14.1 g/dL (ref 13.0–17.0)
MCH: 29 pg (ref 26.0–34.0)
MCHC: 33.7 g/dL (ref 30.0–36.0)
MCV: 85.8 fL (ref 78.0–100.0)
Platelets: 177 10*3/uL (ref 150–400)
RBC: 4.87 MIL/uL (ref 4.22–5.81)
RDW: 14.4 % (ref 11.5–15.5)
WBC: 5.8 10*3/uL (ref 4.0–10.5)

## 2014-09-19 LAB — SALICYLATE LEVEL: Salicylate Lvl: <4 mg/dL (ref 2.8–20.0)

## 2014-09-19 LAB — RAPID URINE DRUG SCREEN, HOSP PERFORMED
Amphetamines: NOT DETECTED
Barbiturates: NOT DETECTED
Benzodiazepines: NOT DETECTED
Cocaine: NOT DETECTED
OPIATES: NOT DETECTED
Tetrahydrocannabinol: NOT DETECTED

## 2014-09-19 LAB — VALPROIC ACID LEVEL: Valproic Acid Lvl: 23.8 ug/mL — ABNORMAL LOW (ref 50.0–100.0)

## 2014-09-19 LAB — ETHANOL: Alcohol, Ethyl (B): <5 mg/dL (ref 0–9)

## 2014-09-19 LAB — ACETAMINOPHEN LEVEL

## 2014-09-19 MED ORDER — ACETAMINOPHEN 325 MG PO TABS
650.0000 mg | ORAL_TABLET | Freq: Once | ORAL | Status: AC
  Administered 2014-09-19: 650 mg via ORAL
  Filled 2014-09-19: qty 2

## 2014-09-19 MED ORDER — DIVALPROEX SODIUM 500 MG PO DR TAB
500.0000 mg | DELAYED_RELEASE_TABLET | ORAL | Status: AC
  Administered 2014-09-19: 500 mg via ORAL
  Filled 2014-09-19: qty 1

## 2014-09-19 NOTE — ED Notes (Signed)
Pt here from [place]. IVC.  Pt having suicidal thoughts. Denies HI at this time.  Paperwork states potential to drive [id number] into traffic.  Pt states thoughts for past few days.

## 2014-09-19 NOTE — ED Notes (Signed)
Pt being sent from [facility] for confusion.  Pt needs medical clearance.  [facility]

## 2014-09-19 NOTE — ED Notes (Signed)
Bed: WA26 Expected date:  Expected time:  Means of arrival:  Comments: 

## 2014-09-19 NOTE — ED Provider Notes (Signed)
CSN: [id number]     Arrival date & time 09/19/14  1027 History   First MD Initiated Contact with Patient 09/19/14 1218     Chief Complaint  Patient presents with  . Suicidal     (Consider location/radiation/quality/duration/timing/severity/associated sxs/prior Treatment) Patient is a 34 y.o. male presenting with mental health disorder. The history is provided by the patient.  Mental Health Problem Presenting symptoms: suicidal thoughts   Patient accompanied by:  Law enforcement Degree of incapacity (severity):  Mild Onset quality:  Sudden Timing:  Constant Progression:  Unchanged Chronicity:  New Context comment:  Spontaneously Relieved by:  Nothing Worsened by:  Nothing tried Ineffective treatments:  None tried Associated symptoms: headaches   Associated symptoms: no abdominal pain and no chest pain     Past Medical History  Diagnosis Date  . Costochondritis     history of  . 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 Schizophrenia     [facility], [place] - Dr. Lennon Alstrom  . Tobacco use disorder   . Mental disorder 2013    hospitalized for psychiatric illness prior  . Normal cardiac stress test 01/07/11    negative treadmill test, no evidence of ischemia, good exercise capacity; Tollie Eth, MD;   . History of echocardiogram 01/07/11    normal LV function, EF 60%, trace mitral, tricuspid and pulmonic regurgitation; Dr. Tollie Eth  . Clotting disorder     personal hx/o? etiology unclear  . Renal stone 06/2012    Urology consult  . Prostatitis 11/2010    Dr. Karsten Ro   Past Surgical History  Procedure Laterality Date  . Eus  06/30/2012    Procedure: UPPER ENDOSCOPIC ULTRASOUND (EUS) LINEAR;  Surgeon: Milus Banister, MD;  Location: [facility];  Service: Endoscopy;  Laterality: N/A;  radial linear  . Wisdom tooth extraction     Family History  Problem Relation Age of Onset  . Hypertension Mother   . Stroke Mother   . Depression Mother   . Acne Mother   . Thyroid  disease Mother   . Diabetes Mother   . Colon cancer Mother 98  . Acute lymphoblastic leukemia Father   . Diabetes Father   . Cancer Brother   . Breast cancer Maternal Aunt   . Ovarian cancer Maternal Aunt    History  Substance Use Topics  . Smoking status: Current Every Day Smoker -- 0.25 packs/day    Types: Cigarettes, Cigars  . Smokeless tobacco: Never Used  . Alcohol Use: Yes     Comment: occ    Review of Systems  Constitutional: Negative for fever.  HENT: Negative for drooling and rhinorrhea.   Eyes: Negative for pain.  Respiratory: Negative for cough and shortness of breath.   Cardiovascular: Negative for chest pain and leg swelling.  Gastrointestinal: Negative for nausea, vomiting, abdominal pain and diarrhea.  Genitourinary: Negative for dysuria and hematuria.  Musculoskeletal: Negative for gait problem and neck pain.  Skin: Negative for color change.  Neurological: Positive for headaches. Negative for numbness.  Hematological: Negative for adenopathy.  Psychiatric/Behavioral: Positive for suicidal ideas and dysphoric mood. Negative for behavioral problems.  All other systems reviewed and are negative.     Allergies  Review of patient's allergies indicates no known allergies.  Home Medications   Prior to Admission medications   Medication Sig Start Date End Date Taking? Authorizing Provider  lurasidone (LATUDA) 40 MG TABS tablet Take 40 mg by mouth daily with breakfast.   Yes Historical Provider, MD  divalproex (  DEPAKOTE ER) 250 MG 24 hr tablet Take 750 mg by mouth at bedtime.    Historical Provider, MD  HYDROCODONE-ACETAMINOPHEN PO Take 1 tablet by mouth every 6 (six) hours as needed (for pain).    Historical Provider, MD  OLANZapine (ZYPREXA) 5 MG tablet Take 5 mg by mouth at bedtime.    Historical Provider, MD  traZODone (DESYREL) 50 MG tablet Take 50 mg by mouth at bedtime.    Historical Provider, MD   BP 135/73 mmHg  Pulse 70  Temp(Src) 98 F (36.7 C)   Resp 20  SpO2 98% Physical Exam  Constitutional: He is oriented to person, place, and time. He appears well-developed and well-nourished.  HENT:  Head: Normocephalic and atraumatic.  Right Ear: External ear normal.  Left Ear: External ear normal.  Nose: Nose normal.  Mouth/Throat: Oropharynx is clear and moist. No oropharyngeal exudate.  Eyes: Conjunctivae and EOM are normal. Pupils are equal, round, and reactive to light.  Neck: Normal range of motion. Neck supple.  Cardiovascular: Normal rate, regular rhythm, normal heart sounds and intact distal pulses.  Exam reveals no gallop and no friction rub.   No murmur heard. Pulmonary/Chest: Effort normal and breath sounds normal. No respiratory distress. He has no wheezes.  Abdominal: Soft. Bowel sounds are normal. He exhibits no distension. There is no tenderness. There is no rebound and no guarding.  Musculoskeletal: Normal range of motion. He exhibits no edema or tenderness.  Neurological: He is alert and oriented to person, place, and time.  alert, oriented x3 speech: normal in context and clarity memory: intact grossly cranial nerves II-XII: intact motor strength: full proximally and distally no involuntary movements or tremors sensation: intact to light touch diffusely  cerebellar: finger-to-nose intact gait: normal forwards and backwards  Skin: Skin is warm and dry.  Psychiatric: His behavior is normal.  Flat affect  Nursing note and vitals reviewed.   ED Course  Procedures (including critical care time) Labs Review Labs Reviewed  ACETAMINOPHEN LEVEL - Abnormal; Notable for the following:    Acetaminophen (Tylenol), Serum <10.0 (*)    All other components within normal limits  COMPREHENSIVE METABOLIC PANEL - Abnormal; Notable for the following:    Glucose, Bld 119 (*)    Total Protein 8.4 (*)    All other components within normal limits  VALPROIC ACID LEVEL - Abnormal; Notable for the following:    Valproic Acid Lvl  23.8 (*)    All other components within normal limits  CBC  ETHANOL  SALICYLATE LEVEL  URINE RAPID DRUG SCREEN (HOSP PERFORMED)    Imaging Review No results found.   EKG Interpretation None      MDM   Final diagnoses:  Suicidal ideations    12:33 PM 34 y.o. male with a history of schizophrenia who presents with suicidal ideations. Patient was sent from [facility]. He was reported to be having suicidal thoughts with paperwork stating potential to drive her miles prior to traffic. He states that he is not specifically suicidal here and has no specific plan. He denies any homicidal thoughts. He states that he does not feel right. He also complains of a mild headache on the vertex of his head consistent with previous headaches. He also complains of some right-sided abdominal pain which he states he has had for years. Vital signs otherwise unremarkable. Lab work thus far unremarkable. He is a ambulatory and has a normal neurologic exam.  1:50 PM: I interpreted/reviewed the labs and/or imaging which were non-contributory.  Normal neuro exam, a/o x3. Flat affect. Ambulatory w/out issues. Do not think CT head needed. HA is mild and decreasing w/ tylenol.  Low valproic acide level. Discussed w/ pharmacy. Will give 500 mg here. I have discussed the diagnosis/risks/treatment options with the patient and believe the pt to be eligible for discharge home to follow-up with [facility]. We also discussed returning to the ED immediately if new or worsening sx occur. We discussed the sx which are most concerning (e.g., worsening HA, AMS, fever) that necessitate immediate return. Medications administered to the patient during their visit and any new prescriptions provided to the patient are listed below.  Medications given during this visit Medications  acetaminophen (TYLENOL) tablet 650 mg (650 mg Oral Given 09/19/14 1316)  divalproex (DEPAKOTE) DR tablet 500 mg (500 mg Oral Given 09/19/14 1347)    New  Prescriptions   No medications on file     Pamella Pert, MD 09/19/14 1351

## 2014-09-19 NOTE — ED Notes (Signed)
Pt unable to provide urine specimen at this time. Pt given cup of ice water.

## 2014-09-19 NOTE — ED Notes (Signed)
Bed: WA33 Expected date:  Expected time:  Means of arrival:  Comments: 

## 2014-09-19 NOTE — ED Notes (Signed)
Patient was given meal, but was not hungry. MD at bedside

## 2014-10-08 DIAGNOSIS — F2081 Schizophreniform disorder: Secondary | ICD-10-CM | POA: Diagnosis not present

## 2014-10-13 ENCOUNTER — Encounter (HOSPITAL_COMMUNITY): Payer: Self-pay | Admitting: Family Medicine

## 2014-10-13 ENCOUNTER — Emergency Department (HOSPITAL_COMMUNITY)
Admission: EM | Admit: 2014-10-13 | Discharge: 2014-10-13 | Disposition: A | Discharge status: Home / Self Care | Payer: Self-pay | Attending: Emergency Medicine | Admitting: Emergency Medicine

## 2014-10-13 DIAGNOSIS — F209 Schizophrenia, unspecified: Secondary | ICD-10-CM | POA: Insufficient documentation

## 2014-10-13 DIAGNOSIS — Z8781 Personal history of (healed) traumatic fracture: Secondary | ICD-10-CM | POA: Insufficient documentation

## 2014-10-13 DIAGNOSIS — N62 Hypertrophy of breast: Secondary | ICD-10-CM | POA: Insufficient documentation

## 2014-10-13 DIAGNOSIS — Z862 Personal history of diseases of the blood and blood-forming organs and certain disorders involving the immune mechanism: Secondary | ICD-10-CM | POA: Insufficient documentation

## 2014-10-13 DIAGNOSIS — Z87442 Personal history of urinary calculi: Secondary | ICD-10-CM | POA: Insufficient documentation

## 2014-10-13 DIAGNOSIS — Z720 Tobacco use: Secondary | ICD-10-CM | POA: Insufficient documentation

## 2014-10-13 DIAGNOSIS — M79641 Pain in right hand: Secondary | ICD-10-CM | POA: Insufficient documentation

## 2014-10-13 DIAGNOSIS — Z8619 Personal history of other infectious and parasitic diseases: Secondary | ICD-10-CM | POA: Insufficient documentation

## 2014-10-13 MED ORDER — NAPROXEN 500 MG PO TABS: 500.0000 mg | ORAL_TABLET | Freq: Two times a day (BID) | ORAL | Status: DC

## 2014-10-13 NOTE — Discharge Instructions (Signed)
Follow-up with Dr. Fredna Dow, regarding your hand. Take naproxen as prescribed as needed. Please follow-up with your doctor who is prescribing your psychiatric medications regarding gynecomastia.

## 2014-10-13 NOTE — ED Provider Notes (Signed)
CSN: [id number]     Arrival date & time 10/13/14  1824 History   This chart was scribed for Jeannett Senior, PA-C working with Alfonzo Beers, MD by Randa Evens, ED Scribe. This patient was seen in room TR05C/TR05C and the patient's care was started at 7:25 PM.    Chief Complaint  Patient presents with  . wants blood test    wants blood test   The history is provided by the patient. No language interpreter was used.   HPI Comments: Scott Farrell is a 34 y.o. male who presents to the Emergency Department for blood test. Pt states that he has been taking Risperdal intermittently for several years. Pt states he has noticed his breast and buttocks getting larger. Pt states that his breast feel weird and sensitive. Pt states that he was taking the Risperdal for an unknown amount of time. Pt states that he may have been on the medication from 2003 to 2008. Pt states that he recently started taking the medication 1 month ago before being taken off of it again, he reports last use was possible 4 years prior. Pt states that he noticed clear discharge coming from his breast several years ago that has now resolved. Pt also reports intermittent HA and blurred vision. Pt states that the Risperdal was prescribed by his psychiatrist for his Hx of schizophrenia. Pt states that he is currently only taking Invega and Depakote.    Pt also reports hand fracture 3-4 years ago. Pt states "i never got the hand set back." Pt states that now he is having pain radiating up towards his shoulder. Pt denies numbness or tingling.   Past Medical History  Diagnosis Date  . Costochondritis     history of  . 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 Schizophrenia     [facility], [place] - Dr. Lennon Alstrom  . Tobacco use disorder   . Mental disorder 2013    hospitalized for psychiatric illness prior  . Normal cardiac stress test 01/07/11    negative treadmill test, no evidence of ischemia, good exercise capacity; Tollie Eth, MD;   . History of echocardiogram 01/07/11    normal LV function, EF 60%, trace mitral, tricuspid and pulmonic regurgitation; Dr. Tollie Eth  . Clotting disorder     personal hx/o? etiology unclear  . Renal stone 06/2012    Urology consult  . Prostatitis 11/2010    Dr. Karsten Ro   Past Surgical History  Procedure Laterality Date  . Eus  06/30/2012    Procedure: UPPER ENDOSCOPIC ULTRASOUND (EUS) LINEAR;  Surgeon: Milus Banister, MD;  Location: [facility];  Service: Endoscopy;  Laterality: N/A;  radial linear  . Wisdom tooth extraction     Family History  Problem Relation Age of Onset  . Hypertension Mother   . Stroke Mother   . Depression Mother   . Acne Mother   . Thyroid disease Mother   . Diabetes Mother   . Colon cancer Mother 22  . Acute lymphoblastic leukemia Father   . Diabetes Father   . Cancer Brother   . Breast cancer Maternal Aunt   . Ovarian cancer Maternal Aunt    History  Substance Use Topics  . Smoking status: Current Every Day Smoker -- 0.25 packs/day    Types: Cigarettes, Cigars  . Smokeless tobacco: Never Used  . Alcohol Use: Yes     Comment: occ    Review of Systems  Musculoskeletal: Positive for arthralgias.  Neurological: Negative for numbness.  All other systems reviewed and are negative.    Allergies  Review of patient's allergies indicates no known allergies.  Home Medications   Prior to Admission medications   Medication Sig Start Date End Date Taking? Authorizing Provider  divalproex (DEPAKOTE ER) 250 MG 24 hr tablet Take 250 mg by mouth at bedtime.     Historical Provider, MD  lurasidone (LATUDA) 40 MG TABS tablet Take 40 mg by mouth daily.     Historical Provider, MD   BP 117/68 mmHg  Pulse 72  Temp(Src) 98.6 F (37 C)  Resp 18  SpO2 96%   Physical Exam  Constitutional: He is oriented to person, place, and time. He appears well-developed and well-nourished. No distress.  HENT:  Head: Normocephalic and atraumatic.   Eyes: Conjunctivae and EOM are normal.  Neck: Neck supple. No tracheal deviation present.  Cardiovascular: Normal rate.   Pulmonary/Chest: Effort normal. No respiratory distress.  No obvious gynecomastia appreciated  Musculoskeletal: Normal range of motion.  Right hand appears to be normal, tender to palpation over the third MCP joint. Full range of motion of all fingers all joints.  Neurological: He is alert and oriented to person, place, and time.  Skin: Skin is warm and dry.  Psychiatric: He has a normal mood and affect. His behavior is normal.  Nursing note and vitals reviewed.   ED Course  Procedures (including critical care time) DIAGNOSTIC STUDIES: Oxygen Saturation is 96% on RA, normal by my interpretation.    COORDINATION OF CARE: 8:19 PM-Discussed treatment plan with pt at bedside and pt agreed to plan.     Labs Review Labs Reviewed - No data to display  Imaging Review No results found.   EKG Interpretation None      MDM   Final diagnoses:  Right hand pain  Gynecomastia   Patient is concerned about ongoing right hand injury, injured this several years ago. At this time exam is unremarkable with full range of motion of all joints. I do not think he needs an imaging of emergent basis. I will refer him to hand specialist since he is having ongoing pain after a fracture. Patient is also complaining of possible gynecomastia. I do not see any obvious gynecomastia on exam, there is no nipple discharge. I instructed him to follow-up with his psychiatrist is prescribing him his psychiatric medications. Or primary care doctor.   Filed Vitals:   10/13/14 1839  BP: 117/68  Pulse: 72  Temp: 98.6 F (37 C)  Resp: 18  SpO2: 96%     I personally performed the services described in this documentation, which was scribed in my presence. The recorded information has been reviewed and is accurate.     Jeannett Senior, PA-C 10/13/14 2026  Alfonzo Beers,  MD 10/13/14 2028

## 2014-10-13 NOTE — ED Notes (Signed)
Per pt sts he has been taking risperidal for a while. sts over the last few years he has noticed his chest getting bigger and bottom growing. sts his lawyer told him to get a blood test to test for gynemastia

## 2014-10-25 DIAGNOSIS — F2081 Schizophreniform disorder: Secondary | ICD-10-CM | POA: Diagnosis not present

## 2014-11-06 DIAGNOSIS — B009 Herpesviral infection, unspecified: Secondary | ICD-10-CM | POA: Diagnosis not present

## 2014-11-22 DIAGNOSIS — F2081 Schizophreniform disorder: Secondary | ICD-10-CM | POA: Diagnosis not present

## 2015-01-03 ENCOUNTER — Encounter (HOSPITAL_COMMUNITY): Payer: Self-pay | Admitting: *Deleted

## 2015-01-03 ENCOUNTER — Emergency Department (HOSPITAL_COMMUNITY)
Admission: EM | Admit: 2015-01-03 | Discharge: 2015-01-03 | Disposition: A | Discharge status: Home / Self Care | Payer: Medicare Other | Attending: Emergency Medicine | Admitting: Emergency Medicine

## 2015-01-03 DIAGNOSIS — F99 Mental disorder, not otherwise specified: Secondary | ICD-10-CM | POA: Insufficient documentation

## 2015-01-03 DIAGNOSIS — Z720 Tobacco use: Secondary | ICD-10-CM | POA: Insufficient documentation

## 2015-01-03 DIAGNOSIS — F209 Schizophrenia, unspecified: Secondary | ICD-10-CM | POA: Diagnosis not present

## 2015-01-03 DIAGNOSIS — Z87442 Personal history of urinary calculi: Secondary | ICD-10-CM | POA: Diagnosis not present

## 2015-01-03 DIAGNOSIS — Z87438 Personal history of other diseases of male genital organs: Secondary | ICD-10-CM | POA: Insufficient documentation

## 2015-01-03 DIAGNOSIS — Z8739 Personal history of other diseases of the musculoskeletal system and connective tissue: Secondary | ICD-10-CM | POA: Insufficient documentation

## 2015-01-03 DIAGNOSIS — R1084 Generalized abdominal pain: Secondary | ICD-10-CM | POA: Diagnosis not present

## 2015-01-03 DIAGNOSIS — Z8619 Personal history of other infectious and parasitic diseases: Secondary | ICD-10-CM | POA: Insufficient documentation

## 2015-01-03 DIAGNOSIS — Z8719 Personal history of other diseases of the digestive system: Secondary | ICD-10-CM | POA: Insufficient documentation

## 2015-01-03 DIAGNOSIS — Z862 Personal history of diseases of the blood and blood-forming organs and certain disorders involving the immune mechanism: Secondary | ICD-10-CM | POA: Diagnosis not present

## 2015-01-03 DIAGNOSIS — R109 Unspecified abdominal pain: Secondary | ICD-10-CM | POA: Diagnosis present

## 2015-01-03 DIAGNOSIS — Z79899 Other long term (current) drug therapy: Secondary | ICD-10-CM | POA: Diagnosis not present

## 2015-01-03 HISTORY — DX: Irritable bowel syndrome, unspecified: K58.9

## 2015-01-03 LAB — URINALYSIS, ROUTINE W REFLEX MICROSCOPIC
Bilirubin Urine: NEGATIVE
Glucose, UA: NEGATIVE mg/dL
HGB URINE DIPSTICK: NEGATIVE
Ketones, ur: NEGATIVE mg/dL
Leukocytes, UA: NEGATIVE
Nitrite: NEGATIVE
Protein, ur: NEGATIVE mg/dL
SPECIFIC GRAVITY, URINE: 1.019 (ref 1.005–1.030)
Urobilinogen, UA: 1 mg/dL (ref 0.0–1.0)
pH: 7.5 (ref 5.0–8.0)

## 2015-01-03 LAB — COMPREHENSIVE METABOLIC PANEL
ALK PHOS: 64 U/L (ref 38–126)
ALT: 24 U/L (ref 17–63)
AST: 34 U/L (ref 15–41)
Albumin: 4 g/dL (ref 3.5–5.0)
Anion gap: 9 (ref 5–15)
BUN: 10 mg/dL (ref 6–20)
CO2: 23 mmol/L (ref 22–32)
CREATININE: 1.12 mg/dL (ref 0.61–1.24)
Calcium: 9.3 mg/dL (ref 8.9–10.3)
Chloride: 107 mmol/L (ref 101–111)
GFR calc Af Amer: >60 mL/min (ref >60)
Glucose, Bld: 103 mg/dL — ABNORMAL HIGH (ref 65–99)
Potassium: 3.9 mmol/L (ref 3.5–5.1)
SODIUM: 139 mmol/L (ref 135–145)
TOTAL PROTEIN: 7.1 g/dL (ref 6.5–8.1)
Total Bilirubin: 0.7 mg/dL (ref 0.3–1.2)

## 2015-01-03 LAB — CBC
HEMATOCRIT: 37.6 % — AB (ref 39.0–52.0)
HEMOGLOBIN: 13 g/dL (ref 13.0–17.0)
MCH: 29.3 pg (ref 26.0–34.0)
MCHC: 34.6 g/dL (ref 30.0–36.0)
MCV: 84.7 fL (ref 78.0–100.0)
PLATELETS: 154 10*3/uL (ref 150–400)
RBC: 4.44 MIL/uL (ref 4.22–5.81)
RDW: 14.4 % (ref 11.5–15.5)
WBC: 5.4 10*3/uL (ref 4.0–10.5)

## 2015-01-03 LAB — LIPASE, BLOOD: LIPASE: 22 U/L (ref 22–51)

## 2015-01-03 MED ORDER — DICYCLOMINE HCL 10 MG PO CAPS
10.0000 mg | ORAL_CAPSULE | Freq: Once | ORAL | Status: AC
  Administered 2015-01-03: 10 mg via ORAL
  Filled 2015-01-03: qty 1

## 2015-01-03 MED ORDER — ONDANSETRON 4 MG PO TBDP
4.0000 mg | ORAL_TABLET | Freq: Once | ORAL | Status: AC
  Administered 2015-01-03: 4 mg via ORAL
  Filled 2015-01-03: qty 1

## 2015-01-03 MED ORDER — DICYCLOMINE HCL 20 MG PO TABS: 20.0000 mg | ORAL_TABLET | Freq: Two times a day (BID) | ORAL | Status: DC

## 2015-01-03 NOTE — Discharge Instructions (Signed)
Abdominal Pain Many things can cause belly (abdominal) pain. Most times, the belly pain is not dangerous. Many cases of belly pain can be watched and treated at home. HOME CARE   Do not take medicines that help you go poop (laxatives) unless told to by your doctor.  Only take medicine as told by your doctor.  Eat or drink as told by your doctor. Your doctor will tell you if you should be on a special diet. GET HELP IF:  You do not know what is causing your belly pain.  You have belly pain while you are sick to your stomach (nauseous) or have runny poop (diarrhea).  You have pain while you pee or poop.  Your belly pain wakes you up at night.  You have belly pain that gets worse or better when you eat.  You have belly pain that gets worse when you eat fatty foods.  You have a fever. GET HELP RIGHT AWAY IF:   The pain does not go away within 2 hours.  You keep throwing up (vomiting).  The pain changes and is only in the right or left part of the belly.  You have bloody or tarry looking poop. MAKE SURE YOU:   Understand these instructions.  Will watch your condition.  Will get help right away if you are not doing well or get worse. Document Released: 11/04/2007 Document Revised: 05/23/2013 Document Reviewed: 01/25/2013 [facility] Patient Information 2015 [place], [place]. This information is not intended to replace advice given to you by your health care provider. Make sure you discuss any questions you have with your health care provider.  Irritable Bowel Syndrome Irritable bowel syndrome (IBS) is caused by a disturbance of normal bowel function and is a common digestive disorder. You may also hear this condition called spastic colon, mucous colitis, and irritable colon. There is no cure for IBS. However, symptoms often gradually improve or disappear with a good diet, stress management, and medicine. This condition usually appears in late adolescence or early adulthood. Women  develop it twice as often as men. CAUSES  After food has been digested and absorbed in the small intestine, waste material is moved into the large intestine, or colon. In the colon, water and salts are absorbed from the undigested products coming from the small intestine. The remaining residue, or fecal material, is held for elimination. Under normal circumstances, gentle, rhythmic contractions of the bowel walls push the fecal material along the colon toward the rectum. In IBS, however, these contractions are irregular and poorly coordinated. The fecal material is either retained too long, resulting in constipation, or expelled too soon, producing diarrhea. SIGNS AND SYMPTOMS  The most common symptom of IBS is abdominal pain. It is often in the lower left side of the abdomen, but it may occur anywhere in the abdomen. The pain comes from spasms of the bowel muscles happening too much and from the buildup of gas and fecal material in the colon. This pain:  Can range from sharp abdominal cramps to a dull, continuous ache.  Often worsens soon after eating.  Is often relieved by having a bowel movement or passing gas. Abdominal pain is usually accompanied by constipation, but it may also produce diarrhea. The diarrhea often occurs right after a meal or upon waking up in the morning. The stools are often soft, watery, and flecked with mucus. Other symptoms of IBS include:  Bloating.  Loss of appetite.  Heartburn.  Backache.  Dull pain in the arms  or shoulders.  Nausea.  Burping.  Vomiting.  Gas. IBS may also cause symptoms that are unrelated to the digestive system, such as:  Fatigue.  Headaches.  Anxiety.  Shortness of breath.  Trouble concentrating.  Dizziness. These symptoms tend to come and go. DIAGNOSIS  The symptoms of IBS may seem like symptoms of other, more serious digestive disorders. Your health care provider may want to perform tests to exclude these disorders.    TREATMENT Many medicines are available to help correct bowel function or relieve bowel spasms and abdominal pain. Among the medicines available are:  Laxatives for severe constipation and to help restore normal bowel habits.  Specific antidiarrheal medicines to treat severe or lasting diarrhea.  Antispasmodic agents to relieve intestinal cramps. Your health care provider may also decide to treat you with a mild tranquilizer or sedative during unusually stressful periods in your life. Your health care provider may also prescribe antidepressant medicine. The use of this medicine has been shown to reduce pain and other symptoms of IBS. Remember that if any medicine is prescribed for you, you should take it exactly as directed. Make sure your health care provider knows how well it worked for you. HOME CARE INSTRUCTIONS   Take all medicines as directed by your health care provider.  Avoid foods that are high in fat or oils, such as heavy cream, butter, frankfurters, sausage, and other fatty meats.  Avoid foods that make you go to the bathroom, such as fruit, fruit juice, and dairy products.  Cut out carbonated drinks, chewing gum, and "gassy" foods such as beans and cabbage. This may help relieve bloating and burping.  Eat foods with bran, and drink plenty of liquids with the bran foods. This helps relieve constipation.  Keep track of what foods seem to bring on your symptoms.  Avoid emotionally charged situations or circumstances that produce anxiety.  Start or continue exercising.  Get plenty of rest and sleep. Document Released: 05/18/2005 Document Revised: 05/23/2013 Document Reviewed: 01/06/2008 [facility] Patient Information 2015 [place], [place]. This information is not intended to replace advice given to you by your health care provider. Make sure you discuss any questions you have with your health care provider.

## 2015-01-03 NOTE — ED Notes (Signed)
Pt stable, ambulatory, states understanding of discharge instructions 

## 2015-01-03 NOTE — ED Notes (Signed)
Pt reports abdominal cramping due to his IBS with diarrhea since monday. Pt states that he also has a rash on his hands.

## 2015-01-03 NOTE — ED Provider Notes (Signed)
CSN: [id number]     Arrival date & time 01/03/15  1856 History   First MD Initiated Contact with Patient 01/03/15 2112     Chief Complaint  Patient presents with  . Abdominal Pain     (Consider location/radiation/quality/duration/timing/severity/associated sxs/prior Treatment) HPI   34 year old male with history of schizophrenia, irritable bowel syndrome, remote prostatitis, irritable bowel syndrome who presents for evaluation of abdominal pain. Patient report having recurrent diffuse abdominal cramping for the past week. States pain is mild to moderate, nonradiating, sometimes worsening with certain smell, and accompanied with occasional nausea. He has tried drinking water and green tea with minimal improvement. Pain feels similar to prior IBS. He reported in the beginning of the week he was having difficulty having bowel movement for 3-4 days but for the past several days he has had persistent runny loose stools at least 2 or 3 times a day. Denies any blood or black stool. Denies fever or chills, no chest pain shortness of breath, productive cough or hemoptysis. Denies any back pain. Does endorse urinary discomfort, but denies any penile discharge or hematuria. He does not have a primary care provider.  Past Medical History  Diagnosis Date  . Costochondritis     history of  . 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 Schizophrenia     [facility], [place] - Dr. Lennon Alstrom  . Tobacco use disorder   . Mental disorder 2013    hospitalized for psychiatric illness prior  . Normal cardiac stress test 01/07/11    negative treadmill test, no evidence of ischemia, good exercise capacity; Tollie Eth, MD;   . History of echocardiogram 01/07/11    normal LV function, EF 60%, trace mitral, tricuspid and pulmonic regurgitation; Dr. Tollie Eth  . Clotting disorder     personal hx/o? etiology unclear  . Renal stone 06/2012    Urology consult  . Prostatitis 11/2010    Dr. Karsten Ro  . IBS (irritable bowel  syndrome)    Past Surgical History  Procedure Laterality Date  . Eus  06/30/2012    Procedure: UPPER ENDOSCOPIC ULTRASOUND (EUS) LINEAR;  Surgeon: Milus Banister, MD;  Location: [facility];  Service: Endoscopy;  Laterality: N/A;  radial linear  . Wisdom tooth extraction     Family History  Problem Relation Age of Onset  . Hypertension Mother   . Stroke Mother   . Depression Mother   . Acne Mother   . Thyroid disease Mother   . Diabetes Mother   . Colon cancer Mother 30  . Acute lymphoblastic leukemia Father   . Diabetes Father   . Cancer Brother   . Breast cancer Maternal Aunt   . Ovarian cancer Maternal Aunt    History  Substance Use Topics  . Smoking status: Current Every Day Smoker -- 0.25 packs/day    Types: Cigarettes, Cigars  . Smokeless tobacco: Never Used  . Alcohol Use: Yes     Comment: occ    Review of Systems  All other systems reviewed and are negative.     Allergies  Review of patient's allergies indicates no known allergies.  Home Medications   Prior to Admission medications   Medication Sig Start Date End Date Taking? Authorizing Provider  divalproex (DEPAKOTE ER) 250 MG 24 hr tablet Take 250 mg by mouth at bedtime.    Yes Historical Provider, MD  lurasidone (LATUDA) 40 MG TABS tablet Take 40 mg by mouth daily.    Yes Historical Provider, MD   BP 126/68  mmHg  Pulse 55  Temp(Src) 98.3 F (36.8 C) (Oral)  Resp 18  SpO2 96% Physical Exam  Constitutional: He appears well-developed and well-nourished. No distress.  HENT:  Head: Atraumatic.  Eyes: Conjunctivae are normal.  Neck: Neck supple.  Cardiovascular: Normal rate and regular rhythm.   Pulmonary/Chest: Effort normal and breath sounds normal.  Abdominal: Soft. Bowel sounds are normal. He exhibits no distension. There is tenderness (Mild diffuse abdominal tenderness without guarding or rebound tenderness.).  Neurological: He is alert.  Skin: No rash noted.  Psychiatric: He has a normal  mood and affect.  Nursing note and vitals reviewed.   ED Course  Procedures (including critical care time)  Patient here with abdominal pain, change in bowel movement, and nausea that felt similar to prior IBS. No rectal bleeding reported. Patient does have mild generalized abdominal tenderness but no peritoneal sign. Labs are reassuring. Bentyl given.  11:11 PM Patient felt better after receiving Bentyl. He is stable for discharge. There is documented family history of gastric cancer. I review patient's last CT scan a year ago which did not show any significant concerning finding. I encourage patient to follow-up with PCP for further evaluation. I do not think advanced imaging is indicated at this time. Return precautions discussed.  Labs Review Labs Reviewed  COMPREHENSIVE METABOLIC PANEL - Abnormal; Notable for the following:    Glucose, Bld 103 (*)    All other components within normal limits  CBC - Abnormal; Notable for the following:    HCT 37.6 (*)    All other components within normal limits  LIPASE, BLOOD  URINALYSIS, ROUTINE W REFLEX MICROSCOPIC (NOT AT [facility])    Imaging Review No results found.   EKG Interpretation None      MDM   Final diagnoses:  Diffuse abdominal pain    BP 113/66 mmHg  Pulse 54  Temp(Src) 98.6 F (37 C) (Oral)  Resp 18  SpO2 97%     Domenic Moras, PA-C 01/03/15 [place], MD 01/04/15 [phone]

## 2015-01-14 DIAGNOSIS — F2081 Schizophreniform disorder: Secondary | ICD-10-CM | POA: Diagnosis not present

## 2015-01-17 DIAGNOSIS — F2081 Schizophreniform disorder: Secondary | ICD-10-CM | POA: Diagnosis not present

## 2015-02-14 DIAGNOSIS — F2081 Schizophreniform disorder: Secondary | ICD-10-CM | POA: Diagnosis not present

## 2015-03-14 DIAGNOSIS — F2081 Schizophreniform disorder: Secondary | ICD-10-CM | POA: Diagnosis not present

## 2015-03-20 DIAGNOSIS — F2081 Schizophreniform disorder: Secondary | ICD-10-CM | POA: Diagnosis not present

## 2015-04-10 ENCOUNTER — Encounter (HOSPITAL_COMMUNITY): Payer: Self-pay | Admitting: Emergency Medicine

## 2015-04-10 ENCOUNTER — Emergency Department (HOSPITAL_COMMUNITY)
Admission: EM | Admit: 2015-04-10 | Discharge: 2015-04-11 | Disposition: A | Discharge status: Home / Self Care | Payer: Medicare Other | Attending: Emergency Medicine | Admitting: Emergency Medicine

## 2015-04-10 DIAGNOSIS — J0110 Acute frontal sinusitis, unspecified: Secondary | ICD-10-CM | POA: Diagnosis not present

## 2015-04-10 DIAGNOSIS — F99 Mental disorder, not otherwise specified: Secondary | ICD-10-CM | POA: Diagnosis not present

## 2015-04-10 DIAGNOSIS — J019 Acute sinusitis, unspecified: Secondary | ICD-10-CM | POA: Diagnosis not present

## 2015-04-10 DIAGNOSIS — R51 Headache: Secondary | ICD-10-CM

## 2015-04-10 DIAGNOSIS — Z720 Tobacco use: Secondary | ICD-10-CM | POA: Diagnosis not present

## 2015-04-10 DIAGNOSIS — K589 Irritable bowel syndrome without diarrhea: Secondary | ICD-10-CM | POA: Insufficient documentation

## 2015-04-10 DIAGNOSIS — Z862 Personal history of diseases of the blood and blood-forming organs and certain disorders involving the immune mechanism: Secondary | ICD-10-CM | POA: Diagnosis not present

## 2015-04-10 DIAGNOSIS — Z87448 Personal history of other diseases of urinary system: Secondary | ICD-10-CM | POA: Insufficient documentation

## 2015-04-10 DIAGNOSIS — Z8739 Personal history of other diseases of the musculoskeletal system and connective tissue: Secondary | ICD-10-CM | POA: Insufficient documentation

## 2015-04-10 DIAGNOSIS — J0100 Acute maxillary sinusitis, unspecified: Secondary | ICD-10-CM | POA: Diagnosis not present

## 2015-04-10 DIAGNOSIS — F209 Schizophrenia, unspecified: Secondary | ICD-10-CM | POA: Diagnosis not present

## 2015-04-10 DIAGNOSIS — Z87442 Personal history of urinary calculi: Secondary | ICD-10-CM | POA: Insufficient documentation

## 2015-04-10 DIAGNOSIS — R519 Headache, unspecified: Secondary | ICD-10-CM

## 2015-04-10 DIAGNOSIS — Z79899 Other long term (current) drug therapy: Secondary | ICD-10-CM | POA: Insufficient documentation

## 2015-04-10 DIAGNOSIS — F17210 Nicotine dependence, cigarettes, uncomplicated: Secondary | ICD-10-CM | POA: Diagnosis not present

## 2015-04-10 DIAGNOSIS — R0981 Nasal congestion: Secondary | ICD-10-CM

## 2015-04-10 DIAGNOSIS — R197 Diarrhea, unspecified: Secondary | ICD-10-CM

## 2015-04-10 DIAGNOSIS — Z8619 Personal history of other infectious and parasitic diseases: Secondary | ICD-10-CM | POA: Insufficient documentation

## 2015-04-10 LAB — COMPREHENSIVE METABOLIC PANEL
ALK PHOS: 61 U/L (ref 38–126)
ALT: 18 U/L (ref 17–63)
ANION GAP: 7 (ref 5–15)
AST: 22 U/L (ref 15–41)
Albumin: 4.8 g/dL (ref 3.5–5.0)
BILIRUBIN TOTAL: 0.7 mg/dL (ref 0.3–1.2)
BUN: 14 mg/dL (ref 6–20)
CHLORIDE: 103 mmol/L (ref 101–111)
CO2: 28 mmol/L (ref 22–32)
Calcium: 9.8 mg/dL (ref 8.9–10.3)
Creatinine, Ser: 1.03 mg/dL (ref 0.61–1.24)
GFR calc Af Amer: >60 mL/min (ref >60)
GFR calc non Af Amer: >60 mL/min (ref >60)
GLUCOSE: 92 mg/dL (ref 65–99)
POTASSIUM: 4.4 mmol/L (ref 3.5–5.1)
SODIUM: 138 mmol/L (ref 135–145)
TOTAL PROTEIN: 7.9 g/dL (ref 6.5–8.1)

## 2015-04-10 LAB — CBC
HEMATOCRIT: 39.4 % (ref 39.0–52.0)
HEMOGLOBIN: 13.4 g/dL (ref 13.0–17.0)
MCH: 28.9 pg (ref 26.0–34.0)
MCHC: 34 g/dL (ref 30.0–36.0)
MCV: 85.1 fL (ref 78.0–100.0)
Platelets: 159 10*3/uL (ref 150–400)
RBC: 4.63 MIL/uL (ref 4.22–5.81)
RDW: 14.5 % (ref 11.5–15.5)
WBC: 5.4 10*3/uL (ref 4.0–10.5)

## 2015-04-10 LAB — LIPASE, BLOOD: LIPASE: 24 U/L (ref 11–51)

## 2015-04-10 MED ORDER — KETOROLAC TROMETHAMINE 30 MG/ML IJ SOLN
30.0000 mg | Freq: Once | INTRAMUSCULAR | Status: AC
  Administered 2015-04-10: 30 mg via INTRAVENOUS

## 2015-04-10 MED ORDER — SODIUM CHLORIDE 0.9 % IV BOLUS (SEPSIS)
1000.0000 mL | Freq: Once | INTRAVENOUS | Status: AC
  Administered 2015-04-10: 1000 mL via INTRAVENOUS

## 2015-04-10 MED ORDER — NAPROXEN 500 MG PO TABS: 500.0000 mg | ORAL_TABLET | Freq: Two times a day (BID) | ORAL | Status: DC | PRN

## 2015-04-10 MED ORDER — FLUTICASONE PROPIONATE 50 MCG/ACT NA SUSP: 2.0000 | Freq: Every day | NASAL | Status: DC

## 2015-04-10 MED ORDER — PROMETHAZINE HCL 25 MG/ML IJ SOLN
25.0000 mg | Freq: Once | INTRAMUSCULAR | Status: AC
  Administered 2015-04-10: 25 mg via INTRAVENOUS
  Filled 2015-04-10: qty 1

## 2015-04-10 MED ORDER — DEXAMETHASONE SODIUM PHOSPHATE 10 MG/ML IJ SOLN
10.0000 mg | Freq: Once | INTRAMUSCULAR | Status: AC
  Administered 2015-04-10: 10 mg via INTRAVENOUS

## 2015-04-10 MED ORDER — AMOXICILLIN-POT CLAVULANATE 875-125 MG PO TABS: 1.0000 | ORAL_TABLET | Freq: Two times a day (BID) | ORAL | Status: DC

## 2015-04-10 NOTE — ED Notes (Signed)
Pt called 3x

## 2015-04-10 NOTE — Discharge Instructions (Signed)
Continue to stay well-hydrated. Continue to alternate between Tylenol and Naprosyn for pain or fever. Use Mucinex for cough suppression/expectoration of mucus. Use netipot and flonase to help with nasal congestion. Take antibiotic for sinus infection as directed. May consider over-the-counter Benadryl or other antihistamine to decrease secretions and for watery itchy eyes. Followup with your primary care doctor in 5-7 days for recheck of ongoing symptoms. Return to emergency department for emergent changing or worsening of symptoms.  Stay well hydrated with small sips of fluids throughout the day. Follow a BRAT (banana-rice-applesauce-toast) diet as described below for the next 24-48 hours. The 'BRAT' diet is suggested, then progress to diet as tolerated as symptoms abate. Call your regular doctor if bloody stools, persistent diarrhea, vomiting, fever or abdominal pain. Return to ER for changing or worsening of symptoms.  Food Choices to Help Relieve Diarrhea When you have diarrhea, the foods you eat and your eating habits are very important. Choosing the right foods and drinks can help relieve diarrhea. Also, because diarrhea can last up to 7 days, you need to replace lost fluids and electrolytes (such as sodium, potassium, and chloride) in order to help prevent dehydration.  WHAT GENERAL GUIDELINES DO I NEED TO FOLLOW?  Slowly drink 1 cup (8 oz) of fluid for each episode of diarrhea. If you are getting enough fluid, your urine will be clear or pale yellow.  Eat starchy foods. Some good choices include white rice, white toast, pasta, low-fiber cereal, baked potatoes (without the skin), saltine crackers, and bagels.  Avoid large servings of any cooked vegetables.  Limit fruit to two servings per day. A serving is  cup or 1 small piece.  Choose foods with less than 2 g of fiber per serving.  Limit fats to less than 8 tsp (38 g) per day.  Avoid fried foods.  Eat foods that have probiotics in  them. Probiotics can be found in certain dairy products.  Avoid foods and beverages that may increase the speed at which food moves through the stomach and intestines (gastrointestinal tract). Things to avoid include:  High-fiber foods, such as dried fruit, raw fruits and vegetables, nuts, seeds, and whole grain foods.  Spicy foods and high-fat foods.  Foods and beverages sweetened with high-fructose corn syrup, honey, or sugar alcohols such as xylitol, sorbitol, and mannitol. WHAT FOODS ARE RECOMMENDED? Grains White rice. White, [place], or pita breads (fresh or toasted), including plain rolls, buns, or bagels. White pasta. Saltine, soda, or graham crackers. Pretzels. Low-fiber cereal. Cooked cereals made with water (such as cornmeal, farina, or cream cereals). Plain muffins. Matzo. Melba toast. Zwieback.  Vegetables Potatoes (without the skin). Strained tomato and vegetable juices. Most well-cooked and canned vegetables without seeds. Tender lettuce. Fruits Cooked or canned applesauce, apricots, cherries, fruit cocktail, grapefruit, peaches, pears, or plums. Fresh bananas, apples without skin, cherries, grapes, cantaloupe, grapefruit, peaches, oranges, or plums.  Meat and Other Protein Products Baked or boiled chicken. Eggs. Tofu. Fish. Seafood. Smooth peanut butter. Ground or well-cooked tender beef, ham, veal, lamb, pork, or poultry.  Dairy Plain yogurt, kefir, and unsweetened liquid yogurt. Lactose-free milk, buttermilk, or soy milk. Plain hard cheese. Beverages Sport drinks. Clear broths. Diluted fruit juices (except prune). Regular, caffeine-free sodas such as ginger ale. Water. Decaffeinated teas. Oral rehydration solutions. Sugar-free beverages not sweetened with sugar alcohols. Other Bouillon, broth, or soups made from recommended foods.  The items listed above may not be a complete list of recommended foods or beverages. Contact your dietitian for  more options. WHAT FOODS ARE NOT  RECOMMENDED? Grains Whole grain, whole wheat, bran, or rye breads, rolls, pastas, crackers, and cereals. Wild or brown rice. Cereals that contain more than 2 g of fiber per serving. Corn tortillas or taco shells. Cooked or dry oatmeal. Granola. Popcorn. Vegetables Raw vegetables. Cabbage, broccoli, Brussels sprouts, artichokes, baked beans, beet greens, corn, kale, legumes, peas, sweet potatoes, and yams. Potato skins. Cooked spinach and cabbage. Fruits Dried fruit, including raisins and dates. Raw fruits. Stewed or dried prunes. Fresh apples with skin, apricots, mangoes, pears, raspberries, and strawberries.  Meat and Other Protein Products Chunky peanut butter. Nuts and seeds. Beans and lentils. Berniece Salines.  Dairy High-fat cheeses. Milk, chocolate milk, and beverages made with milk, such as milk shakes. Cream. Ice cream. Sweets and Desserts Sweet rolls, doughnuts, and sweet breads. Pancakes and waffles. Fats and Oils Butter. Cream sauces. Margarine. Salad oils. Plain salad dressings. Olives. Avocados.  Beverages Caffeinated beverages (such as coffee, tea, soda, or energy drinks). Alcoholic beverages. Fruit juices with pulp. Prune juice. Soft drinks sweetened with high-fructose corn syrup or sugar alcohols. Other Coconut. Hot sauce. Chili powder. Mayonnaise. Gravy. Cream-based or milk-based soups.  The items listed above may not be a complete list of foods and beverages to avoid. Contact your dietitian for more information. WHAT SHOULD I DO IF I BECOME DEHYDRATED? Diarrhea can sometimes lead to dehydration. Signs of dehydration include dark urine and dry mouth and skin. If you think you are dehydrated, you should rehydrate with an oral rehydration solution. These solutions can be purchased at pharmacies, retail stores, or online.  Drink -1 cup (120-240 mL) of oral rehydration solution each time you have an episode of diarrhea. If drinking this amount makes your diarrhea worse, try drinking smaller  amounts more often. For example, drink 1-3 tsp (5-15 mL) every 5-10 minutes.  A general rule for staying hydrated is to drink 1-2 L of fluid per day. Talk to your health care provider about the specific amount you should be drinking each day. Drink enough fluids to keep your urine clear or pale yellow. Document Released: 08/08/2003 Document Revised: 05/23/2013 Document Reviewed: 04/10/2013 [facility] Patient Information 2015 [place], [place]. This information is not intended to replace advice given to you by your health care provider. Make sure you discuss any questions you have with your health care provider.    Sinus Headache A sinus headache occurs when the paranasal sinuses become clogged or swollen. Paranasal sinuses are air pockets within the bones of the face. Sinus headaches can range from mild to severe. CAUSES A sinus headache can result from various conditions that affect the sinuses, such as:  Colds.  Sinus infections.  Allergies. SYMPTOMS The main symptom of this condition is a headache that may feel like pain or pressure in the face, forehead, ears, or upper teeth. People who have a sinus headache often have other symptoms, such as:  Congested or runny nose.  Fever.  Inability to smell. Weather changes can make symptoms worse. DIAGNOSIS This condition may be diagnosed based on:  A physical exam and medical history.  Imaging tests, such as a CT scan and MRI, to check for problems with the sinuses.  A specialist may look into the sinuses with a tool that has a camera (endoscopy). TREATMENT Treatment for this condition depends on the cause.  Sinus pain that is caused by a sinus infection may be treated with antibiotic medicine.  Sinus pain that is caused by allergies may be helped by  allergy medicines (antihistamines) and medicated nasal sprays.  Sinus pain that is caused by congestion may be helped by flushing the nose and sinuses with saline solution. HOME CARE  INSTRUCTIONS  Take medicines only as directed by your health care provider.  If you were prescribed an antibiotic medicine, finish all of it even if you start to feel better.  If you have congestion, use a nasal spray to help reduce pressure.  If directed, apply a warm, moist washcloth to your face to help relieve pain. SEEK MEDICAL CARE IF:  You have headaches more than one time each week.  You have sensitivity to light or sound.  You have a fever.  You feel sick to your stomach (nauseous) or you throw up (vomit).  Your headaches do not get better with treatment. Many people think that they have a sinus headache when they actually have migraines or tension headaches. SEEK IMMEDIATE MEDICAL CARE IF:  You have vision problems.  You have sudden, severe pain in your face or head.  You have a seizure.  You are confused.  You have a stiff neck.   This information is not intended to replace advice given to you by your health care provider. Make sure you discuss any questions you have with your health care provider.   Document Released: 06/25/2004 Document Revised: 10/02/2014 Document Reviewed: 05/14/2014 Elsevier Interactive Patient Education 2016 [employer] Inc[employer]  Sinus Rinse WHAT IS A SINUS RINSE? A sinus rinse is a simple home treatment that is used to rinse your sinuses with a sterile mixture of salt and water (saline solution). Sinuses are air-filled spaces in your skull behind the bones of your face and forehead that open into your nasal cavity. You will use the following:  Saline solution.  Neti pot or spray bottle. This releases the saline solution into your nose and through your sinuses. Neti pots and spray bottles can be purchased at [employer], a health food store, or online. WHEN WOULD I DO A SINUS RINSE? A sinus rinse can help to clear mucus, dirt, dust, or pollen from the nasal cavity. You may do a sinus rinse when you have a cold, a virus, nasal allergy  symptoms, a sinus infection, or stuffiness in the nose or sinuses. If you are considering a sinus rinse:  Ask your child's health care provider before performing a sinus rinse on your child.  Do not do a sinus rinse if you have had ear or nasal surgery, ear infection, or blocked ears. HOW DO I DO A SINUS RINSE?  Wash your hands.  Disinfect your device according to the directions provided and then dry it.  Use the solution that comes with your device or one that is sold separately in stores. Follow the mixing directions on the package.  Fill your device with the amount of saline solution as directed by the device instructions.  Stand over a sink and tilt your head sideways over the sink.  Place the spout of the device in your upper nostril (the one closer to the ceiling).  Gently pour or squeeze the saline solution into the nasal cavity. The liquid should drain to the lower nostril if you are not overly congested.  Gently blow your nose. Blowing too hard may cause ear pain.  Repeat in the other nostril.  Clean and rinse your device with clean water and then air-dry it. ARE THERE RISKS OF A SINUS RINSE?  Sinus rinse is generally very safe and effective. However, there are  a few risks, which include:   A burning sensation in the sinuses. This may happen if you do not make the saline solution as directed. Make sure to follow all directions when making the saline solution.  Infection from contaminated water. This is rare, but possible.  Nasal irritation.   This information is not intended to replace advice given to you by your health care provider. Make sure you discuss any questions you have with your health care provider.   Document Released: 12/13/2013 Document Reviewed: 12/13/2013 Elsevier Interactive Patient Education 2016 [employer].  Sinusitis, Adult Sinusitis is redness, soreness, and inflammation of the paranasal sinuses. Paranasal sinuses are air pockets within the  bones of your face. They are located beneath your eyes, in the middle of your forehead, and above your eyes. In healthy paranasal sinuses, mucus is able to drain out, and air is able to circulate through them by way of your nose. However, when your paranasal sinuses are inflamed, mucus and air can become trapped. This can allow bacteria and other germs to grow and cause infection. Sinusitis can develop quickly and last only a short time (acute) or continue over a long period (chronic). Sinusitis that lasts for more than 12 weeks is considered chronic. CAUSES Causes of sinusitis include:  Allergies.  Structural abnormalities, such as displacement of the cartilage that separates your nostrils (deviated septum), which can decrease the air flow through your nose and sinuses and affect sinus drainage.  Functional abnormalities, such as when the small hairs (cilia) that line your sinuses and help remove mucus do not work properly or are not present. SIGNS AND SYMPTOMS Symptoms of acute and chronic sinusitis are the same. The primary symptoms are pain and pressure around the affected sinuses. Other symptoms include:  Upper toothache.  Earache.  Headache.  Bad breath.  Decreased sense of smell and taste.  A cough, which worsens when you are lying flat.  Fatigue.  Fever.  Thick drainage from your nose, which often is green and may contain pus (purulent).  Swelling and warmth over the affected sinuses. DIAGNOSIS Your health care provider will perform a physical exam. During your exam, your health care provider may perform any of the following to help determine if you have acute sinusitis or chronic sinusitis:  Look in your nose for signs of abnormal growths in your nostrils (nasal polyps).  Tap over the affected sinus to check for signs of infection.  View the inside of your sinuses using an imaging device that has a light attached (endoscope). If your health care provider suspects that  you have chronic sinusitis, one or more of the following tests may be recommended:  Allergy tests.  Nasal culture. A sample of mucus is taken from your nose, sent to a lab, and screened for bacteria.  Nasal cytology. A sample of mucus is taken from your nose and examined by your health care provider to determine if your sinusitis is related to an allergy. TREATMENT Most cases of acute sinusitis are related to a viral infection and will resolve on their own within 10 days. Sometimes, medicines are prescribed to help relieve symptoms of both acute and chronic sinusitis. These may include pain medicines, decongestants, nasal steroid sprays, or saline sprays. However, for sinusitis related to a bacterial infection, your health care provider will prescribe antibiotic medicines. These are medicines that will help kill the bacteria causing the infection. Rarely, sinusitis is caused by a fungal infection. In these cases, your health care provider will  prescribe antifungal medicine. For some cases of chronic sinusitis, surgery is needed. Generally, these are cases in which sinusitis recurs more than 3 times per year, despite other treatments. HOME CARE INSTRUCTIONS  Drink plenty of water. Water helps thin the mucus so your sinuses can drain more easily.  Use a humidifier.  Inhale steam 3-4 times a day (for example, sit in the bathroom with the shower running).  Apply a warm, moist washcloth to your face 3-4 times a day, or as directed by your health care provider.  Use saline nasal sprays to help moisten and clean your sinuses.  Take medicines only as directed by your health care provider.  If you were prescribed either an antibiotic or antifungal medicine, finish it all even if you start to feel better. SEEK IMMEDIATE MEDICAL CARE IF:  You have increasing pain or severe headaches.  You have nausea, vomiting, or drowsiness.  You have swelling around your face.  You have vision  problems.  You have a stiff neck.  You have difficulty breathing.   This information is not intended to replace advice given to you by your health care provider. Make sure you discuss any questions you have with your health care provider.   Document Released: 05/18/2005 Document Revised: 06/08/2014 Document Reviewed: 06/02/2011 Elsevier Interactive Patient Education 2016 [place].  Diarrhea Diarrhea is watery poop (stool). It can make you feel weak, tired, thirsty, or give you a dry mouth (signs of dehydration). Watery poop is a sign of another problem, most often an infection. It often lasts 2-3 days. It can last longer if it is a sign of something serious. Take care of yourself as told by your doctor. HOME CARE   Drink 1 cup (8 ounces) of fluid each time you have watery poop.  Do not drink the following fluids:  Those that contain simple sugars (fructose, glucose, galactose, lactose, sucrose, maltose).  Sports drinks.  Fruit juices.  Whole milk products.  Sodas.  Drinks with caffeine (coffee, tea, soda) or alcohol.  Oral rehydration solution may be used if the doctor says it is okay. You may make your own solution. Follow this recipe:   - teaspoon table salt.   teaspoon baking soda.   teaspoon salt substitute containing potassium chloride.  1 tablespoons sugar.  1 liter (34 ounces) of water.  Avoid the following foods:  High fiber foods, such as raw fruits and vegetables.  Nuts, seeds, and whole grain breads and cereals.   Those that are sweetened with sugar alcohols (xylitol, sorbitol, mannitol).  Try eating the following foods:  Starchy foods, such as rice, toast, pasta, low-sugar cereal, oatmeal, baked potatoes, crackers, and bagels.  Bananas.  Applesauce.  Eat probiotic-rich foods, such as yogurt and milk products that are fermented.  Wash your hands well after each time you have watery poop.  Only take medicine as told by your  doctor.  Take a warm bath to help lessen burning or pain from having watery poop. GET HELP RIGHT AWAY IF:   You cannot drink fluids without throwing up (vomiting).  You keep throwing up.  You have blood in your poop, or your poop looks black and tarry.  You do not pee (urinate) in 6-8 hours, or there is only a small amount of very dark pee.  You have belly (abdominal) pain that gets worse or stays in the same spot (localizes).  You are weak, dizzy, confused, or light-headed.  You have a very bad headache.  Your watery  poop gets worse or does not get better.  You have a fever or lasting symptoms for more than 2-3 days.  You have a fever and your symptoms suddenly get worse. MAKE SURE YOU:   Understand these instructions.  Will watch your condition.  Will get help right away if you are not doing well or get worse.   This information is not intended to replace advice given to you by your health care provider. Make sure you discuss any questions you have with your health care provider.   Document Released: 11/04/2007 Document Revised: 06/08/2014 Document Reviewed: 01/24/2012 Elsevier Interactive Patient Education [employer].

## 2015-04-10 NOTE — ED Notes (Signed)
Pt states that he started having a headache and diarrhea this morning. Alert and oriented. Neuro intact.

## 2015-04-10 NOTE — ED Provider Notes (Signed)
CSN: [id number]     Arrival date & time 04/10/15  1704 History   First MD Initiated Contact with Patient 04/10/15 2017     Chief Complaint  Patient presents with  . Headache  . Diarrhea     (Consider location/radiation/quality/duration/timing/severity/associated sxs/prior Treatment) HPI Comments: Scott Farrell is a 34 y.o. male with a PMHx of costochondritis, herpes genitalia, schizophrenia, tobacco use, ?clotting disorder, nephrolithiasis, IBS, and remote prostatitis, who presents to the ED with complaints of gradual onset headache that began this morning as well as diarrhea. He reports the headache is 7/10 sharp occipital radiating band around his forehead into his face, constant, worse with coughing or movement, and with no treatments tried prior to arrival. He states this is not similar to his prior headaches in the is in a different location and it is less severe. He also reports yellow rhinorrhea and sinus congestion 2 weeks. His diarrhea episodes have been watery nonbloody 4 episodes today. He denies any fevers, chills, neck pain or stiffness, vision changes, syncope, lightheadedness, ear pain or drainage, hearing loss, tinnitus, sore throat, eye pain or drainage, chest pain, shortness of breath, abdominal pain, nausea, vomiting, constipation, melena, hematochezia, obstipation, dysuria, hematuria, numbness, tingling, weakness, or recent travel. Denies any sick contacts.  Patient is a 34 y.o. male presenting with headaches and diarrhea. The history is provided by the patient. No language interpreter was used.  Headache Pain location:  Occipital Quality:  Sharp Radiates to:  Face Severity currently:  7/10 Severity at highest:  7/10 Onset quality:  Gradual Duration:  1 day Timing:  Constant Progression:  Unchanged Chronicity:  New Similar to prior headaches: no (less severe, different location than typical headaches)   Context: activity and coughing   Relieved by:  None  tried Worsened by:  Activity Ineffective treatments:  None tried Associated symptoms: congestion, diarrhea, facial pain (sinus congestion), sinus pressure and URI   Associated symptoms: no abdominal pain, no blurred vision, no dizziness, no ear pain, no fever, no focal weakness, no hearing loss, no myalgias, no nausea, no neck pain, no neck stiffness, no numbness, no paresthesias, no sore throat, no syncope, no tingling, no visual change, no vomiting and no weakness   Diarrhea Associated symptoms: headaches and URI   Associated symptoms: no abdominal pain, no arthralgias, no chills, no fever, no myalgias and no vomiting     Past Medical History  Diagnosis Date  . Costochondritis     history of  . 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 Schizophrenia [facility])     [facility], [place] - Dr. Lennon Alstrom  . Tobacco use disorder   . Mental disorder 2013    hospitalized for psychiatric illness prior  . Normal cardiac stress test 01/07/11    negative treadmill test, no evidence of ischemia, good exercise capacity; Tollie Eth, MD;   . History of echocardiogram 01/07/11    normal LV function, EF 60%, trace mitral, tricuspid and pulmonic regurgitation; Dr. Tollie Eth  . Clotting disorder ([place])     personal hx/o? etiology unclear  . Renal stone 06/2012    Urology consult  . Prostatitis 11/2010    Dr. Karsten Ro  . IBS (irritable bowel syndrome)    Past Surgical History  Procedure Laterality Date  . Eus  06/30/2012    Procedure: UPPER ENDOSCOPIC ULTRASOUND (EUS) LINEAR;  Surgeon: Milus Banister, MD;  Location: [facility];  Service: Endoscopy;  Laterality: N/A;  radial linear  . Wisdom tooth extraction     Family  History  Problem Relation Age of Onset  . Hypertension Mother   . Stroke Mother   . Depression Mother   . Acne Mother   . Thyroid disease Mother   . Diabetes Mother   . Colon cancer Mother 44  . Acute lymphoblastic leukemia Father   . Diabetes Father   . Cancer Brother   . Breast  cancer Maternal Aunt   . Ovarian cancer Maternal Aunt    Social History  Substance Use Topics  . Smoking status: Current Every Day Smoker -- 0.25 packs/day    Types: Cigarettes, Cigars  . Smokeless tobacco: Never Used  . Alcohol Use: Yes     Comment: occ    Review of Systems  Constitutional: Negative for fever and chills.  HENT: Positive for congestion, rhinorrhea and sinus pressure. Negative for ear discharge, ear pain, hearing loss, sore throat and tinnitus.   Eyes: Negative for blurred vision and visual disturbance.  Respiratory: Negative for shortness of breath.   Cardiovascular: Negative for chest pain and syncope.  Gastrointestinal: Positive for diarrhea. Negative for nausea, vomiting, abdominal pain, constipation and blood in stool.  Genitourinary: Negative for dysuria and hematuria.  Musculoskeletal: Negative for myalgias, arthralgias, neck pain and neck stiffness.  Skin: Negative for color change.  Allergic/Immunologic: Negative for immunocompromised state.  Neurological: Positive for headaches. Negative for dizziness, focal weakness, weakness, numbness and paresthesias.  Psychiatric/Behavioral: Negative for confusion.   10 Systems reviewed and are negative for acute change except as noted in the HPI.    Allergies  Review of patient's allergies indicates no known allergies.  Home Medications   Prior to Admission medications   Medication Sig Start Date End Date Taking? Authorizing Provider  dicyclomine (BENTYL) 20 MG tablet Take 1 tablet (20 mg total) by mouth 2 (two) times daily. 01/03/15   Domenic Moras, PA-C  divalproex (DEPAKOTE ER) 250 MG 24 hr tablet Take 250 mg by mouth at bedtime.     Historical Provider, MD  lurasidone (LATUDA) 40 MG TABS tablet Take 40 mg by mouth daily.     Historical Provider, MD   BP 128/67 mmHg  Pulse 61  Temp(Src) 97.6 F (36.4 C) (Oral)  Resp 18  SpO2 98% Physical Exam  Constitutional: He is oriented to person, place, and time. Vital  signs are normal. He appears well-developed and well-nourished.  Non-toxic appearance. No distress.  Afebrile, nontoxic, NAD  HENT:  Head: Normocephalic and atraumatic.  Right Ear: Hearing, tympanic membrane, external ear and ear canal normal.  Left Ear: Hearing, tympanic membrane, external ear and ear canal normal.  Nose: Mucosal edema and rhinorrhea present. Right sinus exhibits maxillary sinus tenderness and frontal sinus tenderness. Left sinus exhibits maxillary sinus tenderness and frontal sinus tenderness.  Mouth/Throat: Uvula is midline, oropharynx is clear and moist and mucous membranes are normal. No trismus in the jaw. No uvula swelling.  Ears are clear bilaterally. Nose with b/l nasal mucosal edema and rhinorrhea, diffuse b/l sinus tenderness. Oropharynx clear and moist, without uvular swelling or deviation, no trismus or drooling, no tonsillar swelling or erythema, no exudates.    Eyes: Conjunctivae and EOM are normal. Pupils are equal, round, and reactive to light. Right eye exhibits no discharge. Left eye exhibits no discharge.  PERRL, EOMI, no nystagmus, no visual field deficits   Neck: Normal range of motion. Neck supple. No spinous process tenderness and no muscular tenderness present. No rigidity. Normal range of motion present.  FROM intact without spinous process TTP, no bony  stepoffs or deformities, no paraspinous muscle TTP or muscle spasms. No rigidity or meningeal signs. No bruising or swelling.   Cardiovascular: Normal rate, regular rhythm, normal heart sounds and intact distal pulses.  Exam reveals no gallop and no friction rub.   No murmur heard. Pulmonary/Chest: Effort normal and breath sounds normal. No respiratory distress. He has no decreased breath sounds. He has no wheezes. He has no rhonchi. He has no rales.  Abdominal: Soft. Normal appearance and bowel sounds are normal. He exhibits no distension. There is no tenderness. There is no rigidity, no rebound, no  guarding, no CVA tenderness, no tenderness at McBurney's point and negative Murphy's sign.  Soft, NTND, +BS throughout, no r/g/r, neg murphy's, neg mcburney's, no CVA TTP   Musculoskeletal: Normal range of motion.  MAE x4 Strength and sensation grossly intact Distal pulses intact Gait steady  Lymphadenopathy:       Head (right side): No submandibular and no tonsillar adenopathy present.       Head (left side): No submandibular and no tonsillar adenopathy present.    He has no cervical adenopathy.  No head/neck LAD  Neurological: He is alert and oriented to person, place, and time. He has normal strength. No cranial nerve deficit or sensory deficit. Coordination and gait normal. GCS eye subscore is 4. GCS verbal subscore is 5. GCS motor subscore is 6.  CN 2-12 grossly intact A&O x4 GCS 15 Sensation and strength intact Gait nonataxic including with tandem walking Coordination with finger-to-nose WNL Neg pronator drift   Skin: Skin is warm, dry and intact. No rash noted.  Psychiatric: He has a normal mood and affect.  Nursing note and vitals reviewed.   ED Course  Procedures (including critical care time) Labs Review Labs Reviewed  LIPASE, BLOOD  COMPREHENSIVE METABOLIC PANEL  CBC    Imaging Review No results found. I have personally reviewed and evaluated these images and lab results as part of my medical decision-making.   EKG Interpretation None      MDM   Final diagnoses:  Acute nonintractable headache, unspecified headache type  Sinus congestion  Acute sinusitis, recurrence not specified, unspecified location  Diarrhea, unspecified type    34 y.o. male here with headache this morning, sinus congestion 2 weeks, and 4 episodes of watery diarrhea this morning. No focal neuro deficits, no abdominal tenderness. No red flag signs/symptoms of headaches, denies this is the worst headache of his life or sudden onset. Tenderness to bilateral sinuses with nasal mucosal  edema and rhinorrhea, likely sinusitis. Labs unremarkable. Diarrhea likely from nasal drainage or his IBS. Will give fluids, toradol, phenergan, and decadron to avoid rebound headache. Doubt need for imaging. Will reassess shortly.   11:32 PM Pt feeling much better. No longer having headache. Will treat as sinusitis. Naprosyn, flonase, and augmentin for home. F/up with PCP in 1wk. I explained the diagnosis and have given explicit precautions to return to the ER including for any other new or worsening symptoms. The patient understands and accepts the medical plan as it's been dictated and I have answered their questions. Discharge instructions concerning home care and prescriptions have been given. The patient is STABLE and is discharged to home in good condition.  BP 128/67 mmHg  Pulse 61  Temp(Src) 97.6 F (36.4 C) (Oral)  Resp 18  SpO2 98%  Meds ordered this encounter  Medications  . sodium chloride 0.9 % bolus 1,000 mL    Sig:    And  . ketorolac (TORADOL)  30 MG/ML injection 30 mg    Sig:   . promethazine (PHENERGAN) injection 25 mg    Sig:   . dexamethasone (DECADRON) injection 10 mg    Sig:   . naproxen (NAPROSYN) 500 MG tablet    Sig: Take 1 tablet (500 mg total) by mouth 2 (two) times daily as needed for mild pain, moderate pain or headache (TAKE WITH MEALS.).    Dispense:  20 tablet    Refill:  0    Order Specific Question:  Supervising Provider    Answer:  MILLER, BRIAN [[id number]]  . fluticasone (FLONASE) 50 MCG/ACT nasal spray    Sig: Place 2 sprays into both nostrils daily.    Dispense:  16 g    Refill:  0    Order Specific Question:  Supervising Provider    Answer:  MILLER, BRIAN [[id number]]  . amoxicillin-clavulanate (AUGMENTIN) 875-125 MG tablet    Sig: Take 1 tablet by mouth 2 (two) times daily. One po bid x 7 days    Dispense:  14 tablet    Refill:  0    Order Specific Question:  Supervising Provider    Answer:  Noemi Chapel [[id number]]      Scott Klutz Camprubi-Soms,  PA-C 04/10/15 2332  Gareth Morgan, MD 04/11/15 1719

## 2015-04-11 DIAGNOSIS — F2081 Schizophreniform disorder: Secondary | ICD-10-CM | POA: Diagnosis not present

## 2015-04-11 NOTE — ED Notes (Signed)
Initial IV insertion was not documented by previous nurse.  Pt has 20g in left antecubital.  IV dc'ed prior to discharge, catheter intact and no active bleeding from site.  Dressing applied.

## 2015-05-02 DIAGNOSIS — F2081 Schizophreniform disorder: Secondary | ICD-10-CM | POA: Diagnosis not present

## 2015-05-09 DIAGNOSIS — F2081 Schizophreniform disorder: Secondary | ICD-10-CM | POA: Diagnosis not present

## 2015-06-05 DIAGNOSIS — Z7951 Long term (current) use of inhaled steroids: Secondary | ICD-10-CM | POA: Insufficient documentation

## 2015-06-05 DIAGNOSIS — H6122 Impacted cerumen, left ear: Secondary | ICD-10-CM | POA: Diagnosis not present

## 2015-06-05 DIAGNOSIS — Z8619 Personal history of other infectious and parasitic diseases: Secondary | ICD-10-CM | POA: Diagnosis not present

## 2015-06-05 DIAGNOSIS — Z8719 Personal history of other diseases of the digestive system: Secondary | ICD-10-CM | POA: Diagnosis not present

## 2015-06-05 DIAGNOSIS — H6642 Suppurative otitis media, unspecified, left ear: Secondary | ICD-10-CM | POA: Diagnosis not present

## 2015-06-05 DIAGNOSIS — H9202 Otalgia, left ear: Secondary | ICD-10-CM | POA: Diagnosis present

## 2015-06-05 DIAGNOSIS — Z8659 Personal history of other mental and behavioral disorders: Secondary | ICD-10-CM | POA: Diagnosis not present

## 2015-06-05 DIAGNOSIS — Z8739 Personal history of other diseases of the musculoskeletal system and connective tissue: Secondary | ICD-10-CM | POA: Diagnosis not present

## 2015-06-05 DIAGNOSIS — F17210 Nicotine dependence, cigarettes, uncomplicated: Secondary | ICD-10-CM | POA: Diagnosis not present

## 2015-06-05 DIAGNOSIS — Z79899 Other long term (current) drug therapy: Secondary | ICD-10-CM | POA: Diagnosis not present

## 2015-06-05 DIAGNOSIS — Z87442 Personal history of urinary calculi: Secondary | ICD-10-CM | POA: Insufficient documentation

## 2015-06-05 DIAGNOSIS — Z87448 Personal history of other diseases of urinary system: Secondary | ICD-10-CM | POA: Diagnosis not present

## 2015-06-05 DIAGNOSIS — Z792 Long term (current) use of antibiotics: Secondary | ICD-10-CM | POA: Diagnosis not present

## 2015-06-06 ENCOUNTER — Emergency Department (HOSPITAL_COMMUNITY)
Admission: EM | Admit: 2015-06-06 | Discharge: 2015-06-06 | Disposition: A | Discharge status: Home / Self Care | Payer: Medicare Other | Attending: Emergency Medicine | Admitting: Emergency Medicine

## 2015-06-06 ENCOUNTER — Encounter (HOSPITAL_COMMUNITY): Payer: Self-pay

## 2015-06-06 DIAGNOSIS — H6642 Suppurative otitis media, unspecified, left ear: Secondary | ICD-10-CM

## 2015-06-06 DIAGNOSIS — F2081 Schizophreniform disorder: Secondary | ICD-10-CM | POA: Diagnosis not present

## 2015-06-06 DIAGNOSIS — H6122 Impacted cerumen, left ear: Secondary | ICD-10-CM

## 2015-06-06 MED ORDER — OXYCODONE-ACETAMINOPHEN 5-325 MG PO TABS
2.0000 | ORAL_TABLET | Freq: Once | ORAL | Status: AC
  Administered 2015-06-06: 2 via ORAL
  Filled 2015-06-06: qty 2

## 2015-06-06 MED ORDER — DOCUSATE SODIUM 100 MG PO CAPS
400.0000 mg | ORAL_CAPSULE | Freq: Once | ORAL | Status: AC
  Administered 2015-06-06: 400 mg via ORAL
  Filled 2015-06-06: qty 4

## 2015-06-06 MED ORDER — ONDANSETRON 4 MG PO TBDP
4.0000 mg | ORAL_TABLET | Freq: Once | ORAL | Status: AC
  Administered 2015-06-06: 4 mg via ORAL
  Filled 2015-06-06: qty 1

## 2015-06-06 MED ORDER — DOCUSATE SODIUM 50 MG/5ML PO LIQD: 50.0000 mg | Freq: Once | ORAL | Status: DC

## 2015-06-06 MED ORDER — AMOXICILLIN 500 MG PO CAPS: 500.0000 mg | ORAL_CAPSULE | Freq: Three times a day (TID) | ORAL | Status: DC

## 2015-06-06 MED ORDER — OXYCODONE-ACETAMINOPHEN 5-325 MG PO TABS: 1.0000 | ORAL_TABLET | ORAL | Status: DC | PRN

## 2015-06-06 MED ORDER — AMOXICILLIN 500 MG PO CAPS
500.0000 mg | ORAL_CAPSULE | Freq: Three times a day (TID) | ORAL | Status: DC
  Administered 2015-06-06: 500 mg via ORAL
  Filled 2015-06-06: qty 1

## 2015-06-06 NOTE — ED Notes (Signed)
Pt states he has been having bilateral ear pain for the past 3 weeks. Wears ear plugs at work and has noticed when he removed them he would have drainage on them and sometimes it would be bloody.

## 2015-06-06 NOTE — ED Provider Notes (Signed)
CSN: [id number]     Arrival date & time 06/05/15  2359 History   None    Chief Complaint  Patient presents with  . Otalgia     (Consider location/radiation/quality/duration/timing/severity/associated sxs/prior Treatment) HPI   Scott Farrell is a(n) 35 y.o. male who presents with cc of BL otalgia, wose on the left. He  has a past medical history of Costochondritis; Herpes genitalia (1/12); Schizophrenia ([place]); Tobacco use disorder; Mental disorder (2013); Normal cardiac stress test (01/07/11); History of echocardiogram (01/07/11); Clotting disorder ([place]); Renal stone (06/2012); Prostatitis (11/2010); and IBS (irritable bowel syndrome). He has had some recent sinus pressure but no other  sxs of uri. Denies fevers, chills, myalgias, arthralgias. Denies DOE, SOB, chest tightness or pressure, radiation to left arm, jaw or back, or diaphoresis. Denies dysuria, flank pain, suprapubic pain, frequency, urgency, or hematuria. Denies headaches, light headedness, weakness, visual disturbances. Denies abdominal pain, nausea, vomiting, diarrhea or constipation.     Past Medical History  Diagnosis Date  . Costochondritis     history of  . 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 Schizophrenia [facility])     [facility], [place] - Dr. Lennon Alstrom  . Tobacco use disorder   . Mental disorder 2013    hospitalized for psychiatric illness prior  . Normal cardiac stress test 01/07/11    negative treadmill test, no evidence of ischemia, good exercise capacity; Tollie Eth, MD;   . History of echocardiogram 01/07/11    normal LV function, EF 60%, trace mitral, tricuspid and pulmonic regurgitation; Dr. Tollie Eth  . Clotting disorder ([place])     personal hx/o? etiology unclear  . Renal stone 06/2012    Urology consult  . Prostatitis 11/2010    Dr. Karsten Ro  . IBS (irritable bowel syndrome)    Past Surgical History  Procedure Laterality Date  . Eus  06/30/2012    Procedure: UPPER ENDOSCOPIC ULTRASOUND (EUS) LINEAR;   Surgeon: Milus Banister, MD;  Location: [facility];  Service: Endoscopy;  Laterality: N/A;  radial linear  . Wisdom tooth extraction     Family History  Problem Relation Age of Onset  . Hypertension Mother   . Stroke Mother   . Depression Mother   . Acne Mother   . Thyroid disease Mother   . Diabetes Mother   . Colon cancer Mother 27  . Acute lymphoblastic leukemia Father   . Diabetes Father   . Cancer Brother   . Breast cancer Maternal Aunt   . Ovarian cancer Maternal Aunt    Social History  Substance Use Topics  . Smoking status: Current Every Day Smoker -- 0.25 packs/day    Types: Cigarettes, Cigars  . Smokeless tobacco: Never Used  . Alcohol Use: Yes     Comment: occ    Review of Systems  Ten systems reviewed and are negative for acute change, except as noted in the HPI.    Allergies  Review of patient's allergies indicates no known allergies.  Home Medications   Prior to Admission medications   Medication Sig Start Date End Date Taking? Authorizing Provider  amoxicillin (AMOXIL) 500 MG capsule Take 1 capsule (500 mg total) by mouth 3 (three) times daily. 06/06/15   Margarita Mail, PA-C  amoxicillin-clavulanate (AUGMENTIN) 875-125 MG tablet Take 1 tablet by mouth 2 (two) times daily. One po bid x 7 days 04/10/15   Mercedes Camprubi-Soms, PA-C  dicyclomine (BENTYL) 20 MG tablet Take 1 tablet (20 mg total) by mouth 2 (two) times daily. Patient not  taking: Reported on 04/10/2015 01/03/15   Domenic Moras, PA-C  divalproex (DEPAKOTE ER) 250 MG 24 hr tablet Take 250 mg by mouth daily.     Historical Provider, MD  fluticasone (FLONASE) 50 MCG/ACT nasal spray Place 2 sprays into both nostrils daily. 04/10/15   Mercedes Camprubi-Soms, PA-C  naproxen (NAPROSYN) 500 MG tablet Take 1 tablet (500 mg total) by mouth 2 (two) times daily as needed for mild pain, moderate pain or headache (TAKE WITH MEALS.). 04/10/15   Mercedes Camprubi-Soms, PA-C  oxyCODONE-acetaminophen (PERCOCET) 5-325 MG  tablet Take 1-2 tablets by mouth every 4 (four) hours as needed. 06/06/15   Dalexa Gentz, PA-C   BP 131/71 mmHg  Pulse 60  Temp(Src) 97.6 F (36.4 C) (Oral)  Resp 14  SpO2 100% Physical Exam  Constitutional: He appears well-developed and well-nourished. No distress.  HENT:  Head: Normocephalic and atraumatic.  Right Ear: Hearing, external ear and ear canal normal. No mastoid tenderness.  Left Ear: There is tenderness. No drainage or swelling. No mastoid tenderness. Decreased hearing is noted.  Ears:  Eyes: Conjunctivae are normal. No scleral icterus.  Neck: Normal range of motion. Neck supple.  Cardiovascular: Normal rate, regular rhythm and normal heart sounds.   Pulmonary/Chest: Effort normal and breath sounds normal. No respiratory distress.  Abdominal: Soft. There is no tenderness.  Musculoskeletal: He exhibits no edema.  Neurological: He is alert.  Skin: Skin is warm and dry. He is not diaphoretic.  Psychiatric: His behavior is normal.  Nursing note and vitals reviewed.   ED Course  .Ear Cerumen Removal Date/Time: 06/08/2015 5:33 PM Performed by: Margarita Mail Authorized by: Margarita Mail Consent: Verbal consent obtained. Risks and benefits: risks, benefits and alternatives were discussed Consent given by: patient Patient understanding: patient states understanding of the procedure being performed Patient identity confirmed: provided demographic data Time out: Immediately prior to procedure a "time out" was called to verify the correct patient, procedure, equipment, support staff and site/side marked as required. Local anesthetic: none Location details: left ear Procedure type: irrigation Patient tolerance: Patient tolerated the procedure well with no immediate complications   (including critical care time) Labs Review Labs Reviewed - No data to display  Imaging Review No results found. I have personally reviewed and evaluated these images and lab results as  part of my medical decision-making.   EKG Interpretation None      MDM   Final diagnoses:  Suppurative otitis media of left ear without spontaneous rupture of tympanic membrane, recurrence not specified, unspecified chronicity  Cerumen impaction, left   Patient with cerumen impaction. TM revealed after disimpaction with purulence and bulging membrane. Will ttreat with amocixillin. No signs of mastoiditis.      Margarita Mail, PA-C 06/08/15 1741  Julianne Rice, MD 06/08/15 [phone]

## 2015-06-06 NOTE — ED Notes (Signed)
Pt given turkey sandwich and sprite.  

## 2015-06-06 NOTE — Discharge Instructions (Signed)
Otitis Media, Adult Otitis media is redness, soreness, and inflammation of the middle ear. Otitis media may be caused by allergies or, most commonly, by infection. Often it occurs as a complication of the common cold. SIGNS AND SYMPTOMS Symptoms of otitis media may include:  Earache.  Fever.  Ringing in your ear.  Headache.  Leakage of fluid from the ear. DIAGNOSIS To diagnose otitis media, your health care provider will examine your ear with an otoscope. This is an instrument that allows your health care provider to see into your ear in order to examine your eardrum. Your health care provider also will ask you questions about your symptoms. TREATMENT  Typically, otitis media resolves on its own within 3-5 days. Your health care provider may prescribe medicine to ease your symptoms of pain. If otitis media does not resolve within 5 days or is recurrent, your health care provider may prescribe antibiotic medicines if he or she suspects that a bacterial infection is the cause. HOME CARE INSTRUCTIONS   If you were prescribed an antibiotic medicine, finish it all even if you start to feel better.  Take medicines only as directed by your health care provider.  Keep all follow-up visits as directed by your health care provider. SEEK MEDICAL CARE IF:  You have otitis media only in one ear, or bleeding from your nose, or both.  You notice a lump on your neck.  You are not getting better in 3-5 days.  You feel worse instead of better. SEEK IMMEDIATE MEDICAL CARE IF:   You have pain that is not controlled with medicine.  You have swelling, redness, or pain around your ear or stiffness in your neck.  You notice that part of your face is paralyzed.  You notice that the bone behind your ear (mastoid) is tender when you touch it. MAKE SURE YOU:   Understand these instructions.  Will watch your condition.  Will get help right away if you are not doing well or get worse.   This  information is not intended to replace advice given to you by your health care provider. Make sure you discuss any questions you have with your health care provider.   Document Released: 02/21/2004 Document Revised: 06/08/2014 Document Reviewed: 12/13/2012 Elsevier Interactive Patient Education 2016 [employer] The structures of the external ear canal secrete a waxy substance known as cerumen. Excess cerumen can build up in the ear canal, causing a condition known as cerumen impaction. Cerumen impaction can cause ear pain and disrupt the function of the ear. The rate of cerumen production differs for each individual. In certain individuals, the configuration of the ear canal may decrease his or her ability to naturally remove cerumen. CAUSES Cerumen impaction is caused by excessive cerumen production or buildup. RISK FACTORS  Frequent use of swabs to clean ears.  Having narrow ear canals.  Having eczema.  Being dehydrated. SIGNS AND SYMPTOMS  Diminished hearing.  Ear drainage.  Ear pain.  Ear itch. TREATMENT Treatment may involve:  Over-the-counter or prescription ear drops to soften the cerumen.  Removal of cerumen by a health care provider. This may be done with:  Irrigation with warm water. This is the most common method of removal.  Ear curettes and other instruments.  Surgery. This may be done in severe cases. HOME CARE INSTRUCTIONS  Take medicines only as directed by your health care provider.  Do not insert objects into the ear with the intent of cleaning the ear. PREVENTION  Do not insert objects into the ear, even with the intent of cleaning the ear. Removing cerumen as a part of normal hygiene is not necessary, and the use of swabs in the ear canal is not recommended.  Drink enough water to keep your urine clear or pale yellow.  Control your eczema if you have it. SEEK MEDICAL CARE IF:  You develop ear pain.  You develop bleeding  from the ear.  The cerumen does not clear after you use ear drops as directed.   This information is not intended to replace advice given to you by your health care provider. Make sure you discuss any questions you have with your health care provider.   Document Released: 06/25/2004 Document Revised: 06/08/2014 Document Reviewed: 01/02/2015 Elsevier Interactive Patient Education [employer].

## 2015-07-02 DIAGNOSIS — H9202 Otalgia, left ear: Secondary | ICD-10-CM | POA: Diagnosis not present

## 2015-07-02 DIAGNOSIS — H938X1 Other specified disorders of right ear: Secondary | ICD-10-CM | POA: Diagnosis not present

## 2015-07-02 DIAGNOSIS — M26602 Left temporomandibular joint disorder, unspecified: Secondary | ICD-10-CM | POA: Diagnosis not present

## 2015-07-12 ENCOUNTER — Emergency Department (HOSPITAL_COMMUNITY): Payer: Medicare Other

## 2015-07-12 ENCOUNTER — Emergency Department (HOSPITAL_COMMUNITY)
Admission: EM | Admit: 2015-07-12 | Discharge: 2015-07-13 | Disposition: A | Discharge status: Home / Self Care | Payer: Medicare Other | Attending: Emergency Medicine | Admitting: Emergency Medicine

## 2015-07-12 DIAGNOSIS — Z862 Personal history of diseases of the blood and blood-forming organs and certain disorders involving the immune mechanism: Secondary | ICD-10-CM | POA: Insufficient documentation

## 2015-07-12 DIAGNOSIS — Z8619 Personal history of other infectious and parasitic diseases: Secondary | ICD-10-CM | POA: Diagnosis not present

## 2015-07-12 DIAGNOSIS — K589 Irritable bowel syndrome without diarrhea: Secondary | ICD-10-CM | POA: Diagnosis not present

## 2015-07-12 DIAGNOSIS — F209 Schizophrenia, unspecified: Secondary | ICD-10-CM | POA: Diagnosis not present

## 2015-07-12 DIAGNOSIS — F17210 Nicotine dependence, cigarettes, uncomplicated: Secondary | ICD-10-CM | POA: Diagnosis not present

## 2015-07-12 DIAGNOSIS — R0602 Shortness of breath: Secondary | ICD-10-CM | POA: Diagnosis not present

## 2015-07-12 DIAGNOSIS — G44209 Tension-type headache, unspecified, not intractable: Secondary | ICD-10-CM | POA: Diagnosis not present

## 2015-07-12 DIAGNOSIS — Z87442 Personal history of urinary calculi: Secondary | ICD-10-CM | POA: Insufficient documentation

## 2015-07-12 DIAGNOSIS — R001 Bradycardia, unspecified: Secondary | ICD-10-CM | POA: Diagnosis not present

## 2015-07-12 DIAGNOSIS — Z79899 Other long term (current) drug therapy: Secondary | ICD-10-CM | POA: Insufficient documentation

## 2015-07-12 DIAGNOSIS — Z8739 Personal history of other diseases of the musculoskeletal system and connective tissue: Secondary | ICD-10-CM | POA: Diagnosis not present

## 2015-07-12 DIAGNOSIS — R079 Chest pain, unspecified: Secondary | ICD-10-CM

## 2015-07-12 DIAGNOSIS — R42 Dizziness and giddiness: Secondary | ICD-10-CM | POA: Diagnosis present

## 2015-07-12 DIAGNOSIS — Z87438 Personal history of other diseases of male genital organs: Secondary | ICD-10-CM | POA: Insufficient documentation

## 2015-07-12 DIAGNOSIS — R110 Nausea: Secondary | ICD-10-CM | POA: Insufficient documentation

## 2015-07-12 LAB — BASIC METABOLIC PANEL
ANION GAP: 13 (ref 5–15)
BUN: 14 mg/dL (ref 6–20)
CALCIUM: 9.6 mg/dL (ref 8.9–10.3)
CO2: 22 mmol/L (ref 22–32)
Chloride: 102 mmol/L (ref 101–111)
Creatinine, Ser: 1.13 mg/dL (ref 0.61–1.24)
GLUCOSE: 92 mg/dL (ref 65–99)
Potassium: 4.2 mmol/L (ref 3.5–5.1)
SODIUM: 137 mmol/L (ref 135–145)

## 2015-07-12 LAB — I-STAT CHEM 8, ED
BUN: 14 mg/dL (ref 6–20)
CHLORIDE: 105 mmol/L (ref 101–111)
Calcium, Ion: 1.13 mmol/L (ref 1.12–1.23)
Creatinine, Ser: 0.5 mg/dL — ABNORMAL LOW (ref 0.61–1.24)
GLUCOSE: 100 mg/dL — AB (ref 65–99)
HCT: 37 % — ABNORMAL LOW (ref 39.0–52.0)
Hemoglobin: 12.6 g/dL — ABNORMAL LOW (ref 13.0–17.0)
POTASSIUM: 3.8 mmol/L (ref 3.5–5.1)
SODIUM: 142 mmol/L (ref 135–145)
TCO2: 27 mmol/L (ref 0–100)

## 2015-07-12 LAB — RAPID URINE DRUG SCREEN, HOSP PERFORMED
Amphetamines: NOT DETECTED
BARBITURATES: NOT DETECTED
BENZODIAZEPINES: NOT DETECTED
Cocaine: NOT DETECTED
OPIATES: NOT DETECTED
Tetrahydrocannabinol: NOT DETECTED

## 2015-07-12 LAB — CBC
HCT: 41.1 % (ref 39.0–52.0)
Hemoglobin: 14.1 g/dL (ref 13.0–17.0)
MCH: 29.1 pg (ref 26.0–34.0)
MCHC: 34.3 g/dL (ref 30.0–36.0)
MCV: 84.9 fL (ref 78.0–100.0)
PLATELETS: 168 10*3/uL (ref 150–400)
RBC: 4.84 MIL/uL (ref 4.22–5.81)
RDW: 14 % (ref 11.5–15.5)
WBC: 5.7 10*3/uL (ref 4.0–10.5)

## 2015-07-12 LAB — I-STAT TROPONIN, ED
TROPONIN I, POC: 0 ng/mL (ref 0.00–0.08)
TROPONIN I, POC: 0 ng/mL (ref 0.00–0.08)

## 2015-07-12 MED ORDER — KETOROLAC TROMETHAMINE 30 MG/ML IJ SOLN
30.0000 mg | Freq: Once | INTRAMUSCULAR | Status: AC
  Administered 2015-07-12: 30 mg via INTRAVENOUS
  Filled 2015-07-12: qty 1

## 2015-07-12 MED ORDER — METOCLOPRAMIDE HCL 5 MG/ML IJ SOLN
10.0000 mg | INTRAMUSCULAR | Status: AC
  Administered 2015-07-12: 10 mg via INTRAVENOUS
  Filled 2015-07-12: qty 2

## 2015-07-12 NOTE — ED Provider Notes (Signed)
CSN: [id number]     Arrival date & time 07/12/15  1633 History   First MD Initiated Contact with Patient 07/12/15 2140     No chief complaint on file.    (Consider location/radiation/quality/duration/timing/severity/associated sxs/prior Treatment) HPI Comments: Patient is a 35 year old male with a history of schizophrenia and IBS. He presents to the emergency department for complaints of headache and chest pain. Patient states that he was at work when he began to feel lightheaded. He states that he developed a central squeezing chest pain as well as a frontal and occipital headache. Symptoms associated with nausea and SOB. Patient states that his chest pain has resolved with rest, but his headache persists. No medications taken PTA for symptoms. No reported aggravating or alleviating factors. Patient denies recurrence of chest pain and SOB with ambulation since arrival. Patient further denies syncope, fever, chills, leg swelling, phonophobia, photophobia, vomiting, or extremity numbness/weakness. No personal hx of DVT/PE. No recent surgeries or hospitalizations. No recent travel.  Patient has a history of a normal stress test in August 2012.  The history is provided by the patient. No language interpreter was used.    Past Medical History  Diagnosis Date  . Costochondritis     history of  . 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 Schizophrenia [facility])     [facility], [place] - Dr. Lennon Alstrom  . Tobacco use disorder   . Mental disorder 2013    hospitalized for psychiatric illness prior  . Normal cardiac stress test 01/07/11    negative treadmill test, no evidence of ischemia, good exercise capacity; Tollie Eth, MD;   . History of echocardiogram 01/07/11    normal LV function, EF 60%, trace mitral, tricuspid and pulmonic regurgitation; Dr. Tollie Eth  . Clotting disorder ([place])     personal hx/o? etiology unclear  . Renal stone 06/2012    Urology consult  . Prostatitis 11/2010    Dr.  Karsten Ro  . IBS (irritable bowel syndrome)    Past Surgical History  Procedure Laterality Date  . Eus  06/30/2012    Procedure: UPPER ENDOSCOPIC ULTRASOUND (EUS) LINEAR;  Surgeon: Milus Banister, MD;  Location: [facility] ENDOSCOPY;  Service: Endoscopy;  Laterality: N/A;  radial linear  . Wisdom tooth extraction     Family History  Problem Relation Age of Onset  . Hypertension Mother   . Stroke Mother   . Depression Mother   . Acne Mother   . Thyroid disease Mother   . Diabetes Mother   . Colon cancer Mother 61  . Acute lymphoblastic leukemia Father   . Diabetes Father   . Cancer Brother   . Breast cancer Maternal Aunt   . Ovarian cancer Maternal Aunt    Social History  Substance Use Topics  . Smoking status: Current Every Day Smoker -- 0.25 packs/day    Types: Cigarettes, Cigars  . Smokeless tobacco: Never Used  . Alcohol Use: Yes     Comment: occ    Review of Systems  Constitutional: Negative for fever.  Respiratory: Positive for shortness of breath.   Cardiovascular: Positive for chest pain.  Gastrointestinal: Positive for nausea. Negative for vomiting.  Neurological: Positive for headaches. Negative for syncope.  All other systems reviewed and are negative.   Allergies  Review of patient's allergies indicates no known allergies.  Home Medications   Prior to Admission medications   Medication Sig Start Date End Date Taking? Authorizing Provider  dicyclomine (BENTYL) 20 MG tablet Take 1 tablet (20 mg  total) by mouth 2 (two) times daily. Patient taking differently: Take 20 mg by mouth 2 (two) times daily as needed for spasms (stomach pain).  01/03/15  Yes Domenic Moras, PA-C  ibuprofen (ADVIL,MOTRIN) 200 MG tablet Take 600 mg by mouth every 6 (six) hours as needed (pain).   Yes Historical Provider, MD  oxyCODONE-acetaminophen (PERCOCET) 5-325 MG tablet Take 1-2 tablets by mouth every 4 (four) hours as needed. Patient taking differently: Take 1-2 tablets by mouth every 4 (four)  hours as needed (pain).  06/06/15  Yes Margarita Mail, PA-C  Paliperidone Palmitate (INVEGA SUSTENNA IM) Inject 1 each into the muscle every 30 (thirty) days. Next injection due 1st week of February 2017   Yes Historical Provider, MD  PRESCRIPTION MEDICATION Take 2 tablets by mouth at bedtime. depakote   Yes Historical Provider, MD  divalproex (DEPAKOTE ER) 250 MG 24 hr tablet Take 250 mg by mouth daily.     Historical Provider, MD  fluticasone (FLONASE) 50 MCG/ACT nasal spray Place 2 sprays into both nostrils daily. Patient not taking: Reported on 07/12/2015 04/10/15   Mercedes Camprubi-Soms, PA-C  naproxen (NAPROSYN) 500 MG tablet Take 1 tablet (500 mg total) by mouth 2 (two) times daily as needed for mild pain, moderate pain or headache (TAKE WITH MEALS.). Patient not taking: Reported on 07/12/2015 04/10/15   Mercedes Camprubi-Soms, PA-C   BP 115/74 mmHg  Pulse 50  Temp(Src) 98.7 F (37.1 C) (Oral)  Resp 24  SpO2 100%   Physical Exam  Constitutional: He is oriented to person, place, and time. He appears well-developed and well-nourished. No distress.  Nontoxic/nonseptic appearing  HENT:  Head: Normocephalic and atraumatic.  Eyes: Conjunctivae and EOM are normal. No scleral icterus.  Neck: Normal range of motion.  Cardiovascular: Regular rhythm and intact distal pulses.  Bradycardia present.   Pulmonary/Chest: Effort normal and breath sounds normal. No respiratory distress. He has no wheezes. He has no rales.  Lungs clear to auscultation bilaterally. Chest expansion symmetric.  Abdominal: Soft. He exhibits no distension. There is no tenderness. There is no rebound.  Soft, nontender, nondistended abdomen.  Musculoskeletal: Normal range of motion.  No BLE edema.  Neurological: He is alert and oriented to person, place, and time. No cranial nerve deficit. He exhibits normal muscle tone. Coordination normal.  GCS 15. Speech is goal oriented. Patient answers questions appropriately and follows  simple commands. No focal neurologic deficits appreciated. Patient moves extremities without ataxia.  Skin: Skin is warm and dry. No rash noted. He is not diaphoretic. No erythema. No pallor.  Psychiatric: He has a normal mood and affect. His behavior is normal.  Nursing note and vitals reviewed.   ED Course  Procedures (including critical care time) Labs Review Labs Reviewed  I-STAT CHEM 8, ED - Abnormal; Notable for the following:    Creatinine, Ser 0.50 (*)    Glucose, Bld 100 (*)    Hemoglobin 12.6 (*)    HCT 37.0 (*)    All other components within normal limits  BASIC METABOLIC PANEL  CBC  URINE RAPID DRUG SCREEN, HOSP PERFORMED  I-STAT TROPOININ, ED  I-STAT TROPOININ, ED    Imaging Review Dg Chest 2 View  07/12/2015  CLINICAL DATA:  Mid chest pain with difficulty breathing today, fatigue, headache EXAM: CHEST  2 VIEW COMPARISON:  Chest x-ray dated 09/23/2012. FINDINGS: Cardiomediastinal silhouette is normal in size and configuration. Lungs are clear. Lung volumes are normal. No evidence of pneumonia. No pleural effusion. No pneumothorax. Osseous and  soft tissue structures about the chest are unremarkable. IMPRESSION: No active cardiopulmonary disease. Electronically Signed   By: Franki Cabot M.D.   On: 07/12/2015 17:56   I have personally reviewed and evaluated these images and lab results as part of my medical decision-making.   ED ECG REPORT   Date: 07/13/2015  Rate: 70  Rhythm: normal sinus rhythm  QRS Axis: normal  Intervals: normal  ST/T Wave abnormalities: nonspecific ST changes  Conduction Disutrbances:none  Narrative Interpretation: NSR with NSST changes; ST elevation, consider early repolarization, pericarditis, or injury. No STEMI  Old EKG Reviewed: unchanged from 09/23/2012  I have personally reviewed the EKG tracing and agree with the computerized printout as noted.  2230 - Patient is PERC negative; doubt PE. Heart score 2 (for NS repol disturbance and  smoking hx of 0.5ppd) c/w low risk of ACS. Patient with hx of normal stress test in 2012.   MDM   Final diagnoses:  Tension headache  Chest pain, unspecified chest pain type    35 year old male presents to the emergency department for evaluation of chest pain and headache which began this afternoon while at work. Patient denies any chest pain at present and states that it resolved prior to arrival with rest. Patient is young and otherwise healthy. He has a reassuring cardiac workup today as well as a heart score of 2 consistent with low risk of ACS. Patient is also at low risk for pulmonary embolus. Chest x-ray shows no mediastinal widening to suggest dissection. No pneumothorax or pneumonia.   With respect to patient's headache, he has a nonfocal neurologic exam today. His headache was atraumatic and onset and has improved with Toradol and Reglan. Doubt emergent intracranial etiology. Headache likely tension type in nature. We will manage further as an outpatient with Fioricet and primary care follow-up. No indication for further emergent workup at this time. Patient stable for discharge. Return precautions provided. Patient agreeable to plan with no unaddressed concerns.    Filed Vitals:   07/12/15 2300 07/12/15 2315 07/12/15 2330 07/13/15 0010  BP: 115/83 111/77 109/73 107/72  Pulse: 47 49 51 49  Temp:    98 F (36.7 C)  TempSrc:    Oral  Resp: 23 24 21 22   SpO2: 99% 100% 98% 98%     Antonietta Breach, PA-C 07/13/15 0030  Orlie Dakin, MD 07/13/15 [id number]

## 2015-07-12 NOTE — ED Notes (Signed)
States he was at work being active when he began to feel a squeezing pain around his heart and a headache, states pain has persisted since onset. He felt sob, and nauseated with onset of pain. Resting relieves the symptoms. Nothing aggravates the symptoms. A&Ox4, resp e/u

## 2015-07-13 MED ORDER — BUTALBITAL-APAP-CAFFEINE 50-325-40 MG PO TABS: 1.0000 | ORAL_TABLET | Freq: Three times a day (TID) | ORAL | Status: DC | PRN

## 2015-07-13 NOTE — Discharge Instructions (Signed)
Follow-up with your primary care doctor for further evaluation of your headache symptoms and chest pain. Your doctor may recommend that you have another stress test completed. Your last stress test was in 2012. If you do not have a primary care doctor, refer to the resource guide below. Take Fioricet as prescribed for persistent headache. Be sure to eat regular meals. Return to the ED as needed if symptoms worsen.  Tension Headache A tension headache is a feeling of pain, pressure, or aching that is often felt over the front and sides of the head. The pain can be dull, or it can feel tight (constricting). Tension headaches are not normally associated with nausea or vomiting, and they do not get worse with physical activity. Tension headaches can last from 30 minutes to several days. This is the most common type of headache. CAUSES The exact cause of this condition is not known. Tension headaches often begin after stress, anxiety, or depression. Other triggers may include:  Alcohol.  Too much caffeine, or caffeine withdrawal.  Respiratory infections, such as colds, flu, or sinus infections.  Dental problems or teeth clenching.  Fatigue.  Holding your head and neck in the same position for a long period of time, such as while using a computer.  Smoking. SYMPTOMS Symptoms of this condition include:  A feeling of pressure around the head.  Dull, aching head pain.  Pain felt over the front and sides of the head.  Tenderness in the muscles of the head, neck, and shoulders. DIAGNOSIS This condition may be diagnosed based on your symptoms and a physical exam. Tests may be done, such as a CT scan or an MRI of your head. These tests may be done if your symptoms are severe or unusual. TREATMENT This condition may be treated with lifestyle changes and medicines to help relieve symptoms. HOME CARE INSTRUCTIONS Managing Pain  Take over-the-counter and prescription medicines only as told by  your health care provider.  Lie down in a dark, quiet room when you have a headache.  If directed, apply ice to the head and neck area:  Put ice in a plastic bag.  Place a towel between your skin and the bag.  Leave the ice on for 20 minutes, 2-3 times per day.  Use a heating pad or a hot shower to apply heat to the head and neck area as told by your health care provider. Eating and Drinking  Eat meals on a regular schedule.  Limit alcohol use.  Decrease your caffeine intake, or stop using caffeine. General Instructions  Keep all follow-up visits as told by your health care provider. This is important.  Keep a headache journal to help find out what may trigger your headaches. For example, write down:  What you eat and drink.  How much sleep you get.  Any change to your diet or medicines.  Try massage or other relaxation techniques.  Limit stress.  Sit up straight, and avoid tensing your muscles.  Do not use tobacco products, including cigarettes, chewing tobacco, or e-cigarettes. If you need help quitting, ask your health care provider.  Exercise regularly as told by your health care provider.  Get 7-9 hours of sleep, or the amount recommended by your health care provider. SEEK MEDICAL CARE IF:  Your symptoms are not helped by medicine.  You have a headache that is different from what you normally experience.  You have nausea or you vomit.  You have a fever. SEEK IMMEDIATE MEDICAL CARE IF:  Your headache becomes severe.  You have repeated vomiting.  You have a stiff neck.  You have a loss of vision.  You have problems with speech.  You have pain in your eye or ear.  You have muscular weakness or loss of muscle control.  You lose your balance or you have trouble walking.  You feel faint or you pass out.  You have confusion.   This information is not intended to replace advice given to you by your health care provider. Make sure you discuss any  questions you have with your health care provider.   Document Released: 05/18/2005 Document Revised: 02/06/2015 Document Reviewed: 09/10/2014 Elsevier Interactive Patient Education 2016 [employer] Nonspecific Chest Pain  Chest pain can be caused by many different conditions. There is always a chance that your pain could be related to something serious, such as a heart attack or a blood clot in your lungs. Chest pain can also be caused by conditions that are not life-threatening. If you have chest pain, it is very important to follow up with your health care provider. CAUSES  Chest pain can be caused by:  Heartburn.  Pneumonia or bronchitis.  Anxiety or stress.  Inflammation around your heart (pericarditis) or lung (pleuritis or pleurisy).  A blood clot in your lung.  A collapsed lung (pneumothorax). It can develop suddenly on its own (spontaneous pneumothorax) or from trauma to the chest.  Shingles infection (varicella-zoster virus).  Heart attack.  Damage to the bones, muscles, and cartilage that make up your chest wall. This can include:  Bruised bones due to injury.  Strained muscles or cartilage due to frequent or repeated coughing or overwork.  Fracture to one or more ribs.  Sore cartilage due to inflammation (costochondritis). RISK FACTORS  Risk factors for chest pain may include:  Activities that increase your risk for trauma or injury to your chest.  Respiratory infections or conditions that cause frequent coughing.  Medical conditions or overeating that can cause heartburn.  Heart disease or family history of heart disease.  Conditions or health behaviors that increase your risk of developing a blood clot.  Having had chicken pox (varicella zoster). SIGNS AND SYMPTOMS Chest pain can feel like:  Burning or tingling on the surface of your chest or deep in your chest.  Crushing, pressure, aching, or squeezing pain.  Dull or sharp pain that is worse  when you move, cough, or take a deep breath.  Pain that is also felt in your back, neck, shoulder, or arm, or pain that spreads to any of these areas. Your chest pain may come and go, or it may stay constant. DIAGNOSIS Lab tests or other studies may be needed to find the cause of your pain. Your health care provider may have you take a test called an ambulatory ECG (electrocardiogram). An ECG records your heartbeat patterns at the time the test is performed. You may also have other tests, such as:  Transthoracic echocardiogram (TTE). During echocardiography, sound waves are used to create a picture of all of the heart structures and to look at how blood flows through your heart.  Transesophageal echocardiogram (TEE).This is a more advanced imaging test that obtains images from inside your body. It allows your health care provider to see your heart in finer detail.  Cardiac monitoring. This allows your health care provider to monitor your heart rate and rhythm in real time.  Holter monitor. This is a portable device that records your heartbeat and can help  to diagnose abnormal heartbeats. It allows your health care provider to track your heart activity for several days, if needed.  Stress tests. These can be done through exercise or by taking medicine that makes your heart beat more quickly.  Blood tests.  Imaging tests. TREATMENT  Your treatment depends on what is causing your chest pain. Treatment may include:  Medicines. These may include:  Acid blockers for heartburn.  Anti-inflammatory medicine.  Pain medicine for inflammatory conditions.  Antibiotic medicine, if an infection is present.  Medicines to dissolve blood clots.  Medicines to treat coronary artery disease.  Supportive care for conditions that do not require medicines. This may include:  Resting.  Applying heat or cold packs to injured areas.  Limiting activities until pain decreases. HOME CARE  INSTRUCTIONS  If you were prescribed an antibiotic medicine, finish it all even if you start to feel better.  Avoid any activities that bring on chest pain.  Do not use any tobacco products, including cigarettes, chewing tobacco, or electronic cigarettes. If you need help quitting, ask your health care provider.  Do not drink alcohol.  Take medicines only as directed by your health care provider.  Keep all follow-up visits as directed by your health care provider. This is important. This includes any further testing if your chest pain does not go away.  If heartburn is the cause for your chest pain, you may be told to keep your head raised (elevated) while sleeping. This reduces the chance that acid will go from your stomach into your esophagus.  Make lifestyle changes as directed by your health care provider. These may include:  Getting regular exercise. Ask your health care provider to suggest some activities that are safe for you.  Eating a heart-healthy diet. A registered dietitian can help you to learn healthy eating options.  Maintaining a healthy weight.  Managing diabetes, if necessary.  Reducing stress. SEEK MEDICAL CARE IF:  Your chest pain does not go away after treatment.  You have a rash with blisters on your chest.  You have a fever. SEEK IMMEDIATE MEDICAL CARE IF:   Your chest pain is worse.  You have an increasing cough, or you cough up blood.  You have severe abdominal pain.  You have severe weakness.  You faint.  You have chills.  You have sudden, unexplained chest discomfort.  You have sudden, unexplained discomfort in your arms, back, neck, or jaw.  You have shortness of breath at any time.  You suddenly start to sweat, or your skin gets clammy.  You feel nauseous or you vomit.  You suddenly feel light-headed or dizzy.  Your heart begins to beat quickly, or it feels like it is skipping beats. These symptoms may represent a serious  problem that is an emergency. Do not wait to see if the symptoms will go away. Get medical help right away. Call your local emergency services (911 in the [place] Do not drive yourself to the hospital.   This information is not intended to replace advice given to you by your health care provider. Make sure you discuss any questions you have with your health care provider.   Document Released: 02/25/2005 Document Revised: 06/08/2014 Document Reviewed: 12/22/2013 Elsevier Interactive Patient Education [employer].

## 2015-07-18 DIAGNOSIS — F2081 Schizophreniform disorder: Secondary | ICD-10-CM | POA: Diagnosis not present

## 2015-07-23 ENCOUNTER — Emergency Department (INDEPENDENT_AMBULATORY_CARE_PROVIDER_SITE_OTHER)
Admission: EM | Admit: 2015-07-23 | Discharge: 2015-07-23 | Disposition: A | Discharge status: Home / Self Care | Payer: Medicare Other | Source: Home / Self Care | Attending: Family Medicine | Admitting: Family Medicine

## 2015-07-23 ENCOUNTER — Encounter (HOSPITAL_COMMUNITY): Payer: Self-pay | Admitting: *Deleted

## 2015-07-23 DIAGNOSIS — L03115 Cellulitis of right lower limb: Secondary | ICD-10-CM

## 2015-07-23 MED ORDER — BACITRACIN ZINC 500 UNIT/GM EX OINT
TOPICAL_OINTMENT | CUTANEOUS | Status: AC
  Filled 2015-07-23: qty 1.8

## 2015-07-23 MED ORDER — POVIDONE-IODINE 10 % EX SOLN
CUTANEOUS | Status: AC
  Filled 2015-07-23: qty 118

## 2015-07-23 MED ORDER — CEPHALEXIN 500 MG PO CAPS: 500.0000 mg | ORAL_CAPSULE | Freq: Three times a day (TID) | ORAL | Status: DC

## 2015-07-23 MED ORDER — TRIAMCINOLONE ACETONIDE 0.1 % EX CREA: 1.0000 "application " | TOPICAL_CREAM | Freq: Three times a day (TID) | CUTANEOUS | Status: DC

## 2015-07-23 NOTE — ED Provider Notes (Signed)
CSN: [id number]     Arrival date & time 07/23/15  1339 History   First MD Initiated Contact with Patient 07/23/15 1641     Chief Complaint  Patient presents with  . Foot Problem   (Consider location/radiation/quality/duration/timing/severity/associated sxs/prior Treatment) Patient is a 35 y.o. male presenting with rash. The history is provided by the patient.  Rash Location:  Toe Toe rash location:  R second toe, R third toe and R fourth toe Quality: dryness, itchiness and redness   Quality: not swelling and not weeping   Severity:  Mild Onset quality:  Gradual Duration:  1 month Progression:  Unchanged Chronicity:  Chronic Relieved by:  None tried Worsened by:  Nothing tried Ineffective treatments:  None tried   Past Medical History  Diagnosis Date  . Costochondritis     history of  . 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 Schizophrenia [facility])     [facility], [place] - Dr. Lennon Alstrom  . Tobacco use disorder   . Mental disorder 2013    hospitalized for psychiatric illness prior  . Normal cardiac stress test 01/07/11    negative treadmill test, no evidence of ischemia, good exercise capacity; Tollie Eth, MD;   . History of echocardiogram 01/07/11    normal LV function, EF 60%, trace mitral, tricuspid and pulmonic regurgitation; Dr. Tollie Eth  . Clotting disorder ([place])     personal hx/o? etiology unclear  . Renal stone 06/2012    Urology consult  . Prostatitis 11/2010    Dr. Karsten Ro  . IBS (irritable bowel syndrome)    Past Surgical History  Procedure Laterality Date  . Eus  06/30/2012    Procedure: UPPER ENDOSCOPIC ULTRASOUND (EUS) LINEAR;  Surgeon: Milus Banister, MD;  Location: [facility];  Service: Endoscopy;  Laterality: N/A;  radial linear  . Wisdom tooth extraction     Family History  Problem Relation Age of Onset  . Hypertension Mother   . Stroke Mother   . Depression Mother   . Acne Mother   . Thyroid disease Mother   . Diabetes Mother   . Colon  cancer Mother 20  . Acute lymphoblastic leukemia Father   . Diabetes Father   . Cancer Brother   . Breast cancer Maternal Aunt   . Ovarian cancer Maternal Aunt    Social History  Substance Use Topics  . Smoking status: Current Every Day Smoker -- 0.25 packs/day    Types: Cigarettes, Cigars  . Smokeless tobacco: Never Used  . Alcohol Use: Yes     Comment: occ    Review of Systems  Constitutional: Negative.   Skin: Positive for rash. Negative for pallor and wound.  All other systems reviewed and are negative.   Allergies  Review of patient's allergies indicates no known allergies.  Home Medications   Prior to Admission medications   Medication Sig Start Date End Date Taking? Authorizing Provider  butalbital-acetaminophen-caffeine (FIORICET) 50-325-40 MG tablet Take 1-2 tablets by mouth every 8 (eight) hours as needed for headache. 07/13/15 07/12/16  Antonietta Breach, PA-C  cephALEXin (KEFLEX) 500 MG capsule Take 1 capsule (500 mg total) by mouth 3 (three) times daily. Take all of medicine and drink lots of fluids 07/23/15   Billy Fischer, MD  dicyclomine (BENTYL) 20 MG tablet Take 1 tablet (20 mg total) by mouth 2 (two) times daily. Patient taking differently: Take 20 mg by mouth 2 (two) times daily as needed for spasms (stomach pain).  01/03/15   Domenic Moras, PA-C  divalproex (DEPAKOTE ER) 250 MG 24 hr tablet Take 250 mg by mouth daily.     Historical Provider, MD  fluticasone (FLONASE) 50 MCG/ACT nasal spray Place 2 sprays into both nostrils daily. Patient not taking: Reported on 07/12/2015 04/10/15   Mercedes Camprubi-Soms, PA-C  ibuprofen (ADVIL,MOTRIN) 200 MG tablet Take 600 mg by mouth every 6 (six) hours as needed (pain).    Historical Provider, MD  naproxen (NAPROSYN) 500 MG tablet Take 1 tablet (500 mg total) by mouth 2 (two) times daily as needed for mild pain, moderate pain or headache (TAKE WITH MEALS.). Patient not taking: Reported on 07/12/2015 04/10/15   Mercedes Camprubi-Soms,  PA-C  oxyCODONE-acetaminophen (PERCOCET) 5-325 MG tablet Take 1-2 tablets by mouth every 4 (four) hours as needed. Patient taking differently: Take 1-2 tablets by mouth every 4 (four) hours as needed (pain).  06/06/15   Margarita Mail, PA-C  Paliperidone Palmitate (INVEGA SUSTENNA IM) Inject 1 each into the muscle every 30 (thirty) days. Next injection due 1st week of February 2017    Historical Provider, MD  PRESCRIPTION MEDICATION Take 2 tablets by mouth at bedtime. depakote    Historical Provider, MD  triamcinolone cream (KENALOG) 0.1 % Apply 1 application topically 3 (three) times daily. 07/23/15   Billy Fischer, MD   Meds Ordered and Administered this Visit  Medications - No data to display  BP 120/77 mmHg  Pulse 63  Temp(Src) 98.3 F (36.8 C) (Oral)  Resp 16  SpO2 100% No data found.   Physical Exam  Constitutional: He is oriented to person, place, and time. He appears well-developed and well-nourished.  Musculoskeletal: He exhibits no tenderness.       Feet:  Neurological: He is alert and oriented to person, place, and time.  Skin: Skin is warm and dry. Rash noted.  Nursing note and vitals reviewed.   ED Course  Procedures (including critical care time)  Labs Review Labs Reviewed - No data to display  Imaging Review No results found.   Visual Acuity Review  Right Eye Distance:   Left Eye Distance:   Bilateral Distance:    Right Eye Near:   Left Eye Near:    Bilateral Near:         MDM   1. Cellulitis of right foot    Meds ordered this encounter  Medications  . cephALEXin (KEFLEX) 500 MG capsule    Sig: Take 1 capsule (500 mg total) by mouth 3 (three) times daily. Take all of medicine and drink lots of fluids    Dispense:  21 capsule    Refill:  0  . triamcinolone cream (KENALOG) 0.1 %    Sig: Apply 1 application topically 3 (three) times daily.    Dispense:  45 g    Refill:  0       Billy Fischer, MD 07/23/15 [phone]

## 2015-07-23 NOTE — Discharge Instructions (Signed)
Soak at bedtime, take all of medicine, return as needed.

## 2015-07-23 NOTE — ED Notes (Signed)
Pt  Reports  He has  Problems   With  The  Toes  Of  His  r  Foot   For  About  1  Month     pt  Reports  He  Has  Been  Soaking  His  Foot  In    Alcohol  And  Vinegar  For  About  1  Month

## 2015-08-05 DIAGNOSIS — F2081 Schizophreniform disorder: Secondary | ICD-10-CM | POA: Diagnosis not present

## 2015-08-21 DIAGNOSIS — F2081 Schizophreniform disorder: Secondary | ICD-10-CM | POA: Diagnosis not present

## 2015-09-18 DIAGNOSIS — F2081 Schizophreniform disorder: Secondary | ICD-10-CM | POA: Diagnosis not present

## 2015-10-15 DIAGNOSIS — F2081 Schizophreniform disorder: Secondary | ICD-10-CM | POA: Diagnosis not present

## 2015-11-14 DIAGNOSIS — F2081 Schizophreniform disorder: Secondary | ICD-10-CM | POA: Diagnosis not present

## 2015-12-11 DIAGNOSIS — F2081 Schizophreniform disorder: Secondary | ICD-10-CM | POA: Diagnosis not present

## 2015-12-12 ENCOUNTER — Ambulatory Visit (HOSPITAL_COMMUNITY)
Admission: EM | Admit: 2015-12-12 | Discharge: 2015-12-12 | Disposition: A | Discharge status: Home / Self Care | Payer: Medicare Other | Attending: Family Medicine | Admitting: Family Medicine

## 2015-12-12 ENCOUNTER — Encounter (HOSPITAL_COMMUNITY): Payer: Self-pay | Admitting: Emergency Medicine

## 2015-12-12 DIAGNOSIS — Z833 Family history of diabetes mellitus: Secondary | ICD-10-CM | POA: Diagnosis not present

## 2015-12-12 DIAGNOSIS — Z823 Family history of stroke: Secondary | ICD-10-CM | POA: Insufficient documentation

## 2015-12-12 DIAGNOSIS — A6000 Herpesviral infection of urogenital system, unspecified: Secondary | ICD-10-CM | POA: Insufficient documentation

## 2015-12-12 DIAGNOSIS — Z79899 Other long term (current) drug therapy: Secondary | ICD-10-CM | POA: Insufficient documentation

## 2015-12-12 DIAGNOSIS — F17210 Nicotine dependence, cigarettes, uncomplicated: Secondary | ICD-10-CM | POA: Diagnosis not present

## 2015-12-12 DIAGNOSIS — Z8349 Family history of other endocrine, nutritional and metabolic diseases: Secondary | ICD-10-CM | POA: Diagnosis not present

## 2015-12-12 DIAGNOSIS — Z8249 Family history of ischemic heart disease and other diseases of the circulatory system: Secondary | ICD-10-CM | POA: Insufficient documentation

## 2015-12-12 DIAGNOSIS — Z711 Person with feared health complaint in whom no diagnosis is made: Secondary | ICD-10-CM

## 2015-12-12 DIAGNOSIS — Z202 Contact with and (suspected) exposure to infections with a predominantly sexual mode of transmission: Secondary | ICD-10-CM | POA: Diagnosis not present

## 2015-12-12 DIAGNOSIS — Z8619 Personal history of other infectious and parasitic diseases: Secondary | ICD-10-CM

## 2015-12-12 LAB — POCT URINALYSIS DIP (DEVICE)
BILIRUBIN URINE: NEGATIVE
GLUCOSE, UA: NEGATIVE mg/dL
HGB URINE DIPSTICK: NEGATIVE
KETONES UR: NEGATIVE mg/dL
LEUKOCYTES UA: NEGATIVE
Nitrite: NEGATIVE
Protein, ur: NEGATIVE mg/dL
UROBILINOGEN UA: 0.2 mg/dL (ref 0.0–1.0)
pH: 5.5 (ref 5.0–8.0)

## 2015-12-12 MED ORDER — VALACYCLOVIR HCL 1 G PO TABS: 1000.0000 mg | ORAL_TABLET | Freq: Two times a day (BID) | ORAL | Status: DC

## 2015-12-12 NOTE — ED Provider Notes (Signed)
CSN: [id number]     Arrival date & time 12/12/15  1216 History   First MD Initiated Contact with Patient 12/12/15 1237     Chief Complaint  Patient presents with  . Exposure to STD   (Consider location/radiation/quality/duration/timing/severity/associated sxs/prior Treatment) HPI Scott Farrell is a 35 y.o. male presenting to UC with c/o "tingling" and mild irritation on his penis for about 2 weeks. He reports being dx with genital herpes via blood test in the ED "a while back" and was treated with valtrex.  Pt believes he is having another "break out" and is requested to be tested for STDs. Pt requested to be retested for herpes despite having a positive blood test in the past.  Denies penial discharge, dysuria, lower abdominal pain or back pain. Denies fever, n/v/d. No recent intercourse.    Past Medical History  Diagnosis Date  . Costochondritis     history of  . 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 Schizophrenia [facility])     [facility], [place] - Dr. Lennon Alstrom  . Tobacco use disorder   . Mental disorder 2013    hospitalized for psychiatric illness prior  . Normal cardiac stress test 01/07/11    negative treadmill test, no evidence of ischemia, good exercise capacity; Tollie Eth, MD;   . History of echocardiogram 01/07/11    normal LV function, EF 60%, trace mitral, tricuspid and pulmonic regurgitation; Dr. Tollie Eth  . Clotting disorder ([place])     personal hx/o? etiology unclear  . Renal stone 06/2012    Urology consult  . Prostatitis 11/2010    Dr. Karsten Ro  . IBS (irritable bowel syndrome)    Past Surgical History  Procedure Laterality Date  . Eus  06/30/2012    Procedure: UPPER ENDOSCOPIC ULTRASOUND (EUS) LINEAR;  Surgeon: Milus Banister, MD;  Location: [facility];  Service: Endoscopy;  Laterality: N/A;  radial linear  . Wisdom tooth extraction     Family History  Problem Relation Age of Onset  . Hypertension Mother   . Stroke Mother   . Depression Mother   .  Acne Mother   . Thyroid disease Mother   . Diabetes Mother   . Colon cancer Mother 38  . Acute lymphoblastic leukemia Father   . Diabetes Father   . Cancer Brother   . Breast cancer Maternal Aunt   . Ovarian cancer Maternal Aunt    Social History  Substance Use Topics  . Smoking status: Current Every Day Smoker -- 0.25 packs/day    Types: Cigarettes, Cigars  . Smokeless tobacco: Never Used  . Alcohol Use: Yes     Comment: occ    Review of Systems  Constitutional: Negative for fever and chills.  Gastrointestinal: Negative for nausea, vomiting, abdominal pain and diarrhea.  Genitourinary: Positive for genital sores and penile pain. Negative for dysuria, urgency, frequency, hematuria, flank pain, decreased urine volume, penile swelling, scrotal swelling and testicular pain.  Musculoskeletal: Negative for myalgias and back pain.    Allergies  Review of patient's allergies indicates no known allergies.  Home Medications   Prior to Admission medications   Medication Sig Start Date End Date Taking? Authorizing Provider  divalproex (DEPAKOTE ER) 250 MG 24 hr tablet Take 250 mg by mouth daily.    Yes Historical Provider, MD  Paliperidone Palmitate (INVEGA SUSTENNA IM) Inject 1 each into the muscle every 30 (thirty) days. Next injection due 1st week of February 2017   Yes Historical Provider, MD  butalbital-acetaminophen-caffeine [facility]) [phone]  MG tablet Take 1-2 tablets by mouth every 8 (eight) hours as needed for headache. 07/13/15 07/12/16  Antonietta Breach, PA-C  cephALEXin (KEFLEX) 500 MG capsule Take 1 capsule (500 mg total) by mouth 3 (three) times daily. Take all of medicine and drink lots of fluids 07/23/15   Billy Fischer, MD  dicyclomine (BENTYL) 20 MG tablet Take 1 tablet (20 mg total) by mouth 2 (two) times daily. Patient taking differently: Take 20 mg by mouth 2 (two) times daily as needed for spasms (stomach pain).  01/03/15   Domenic Moras, PA-C  fluticasone (FLONASE) 50 MCG/ACT  nasal spray Place 2 sprays into both nostrils daily. Patient not taking: Reported on 07/12/2015 04/10/15   Mercedes Camprubi-Soms, PA-C  ibuprofen (ADVIL,MOTRIN) 200 MG tablet Take 600 mg by mouth every 6 (six) hours as needed (pain).    Historical Provider, MD  naproxen (NAPROSYN) 500 MG tablet Take 1 tablet (500 mg total) by mouth 2 (two) times daily as needed for mild pain, moderate pain or headache (TAKE WITH MEALS.). Patient not taking: Reported on 07/12/2015 04/10/15   Mercedes Camprubi-Soms, PA-C  oxyCODONE-acetaminophen (PERCOCET) 5-325 MG tablet Take 1-2 tablets by mouth every 4 (four) hours as needed. Patient taking differently: Take 1-2 tablets by mouth every 4 (four) hours as needed (pain).  06/06/15   Margarita Mail, PA-C  PRESCRIPTION MEDICATION Take 2 tablets by mouth at bedtime. depakote    Historical Provider, MD  triamcinolone cream (KENALOG) 0.1 % Apply 1 application topically 3 (three) times daily. 07/23/15   Billy Fischer, MD  valACYclovir (VALTREX) 1000 MG tablet Take 1 tablet (1,000 mg total) by mouth 2 (two) times daily. For 7 days for outbreaks 12/12/15   Noland Fordyce, PA-C   Meds Ordered and Administered this Visit  Medications - No data to display  BP 115/68 mmHg  Pulse 57  Temp(Src) 98.5 F (36.9 C) (Oral)  Resp 18  SpO2 98% No data found.   Physical Exam  Constitutional: He is oriented to person, place, and time. He appears well-developed and well-nourished.  HENT:  Head: Normocephalic and atraumatic.  Eyes: EOM are normal.  Neck: Normal range of motion.  Cardiovascular: Normal rate.   Pulmonary/Chest: Effort normal.  Genitourinary: Circumcised. Penile erythema present. No penile tenderness. No discharge found.  Faint mildly erythematous papules on shaft of penis. No vesicles or ulcerations noted.  Musculoskeletal: Normal range of motion.  Neurological: He is alert and oriented to person, place, and time.  Skin: Skin is warm and dry.  Psychiatric: He has a  normal mood and affect. His behavior is normal.  Nursing note and vitals reviewed.   ED Course  Procedures (including critical care time)  Labs Review Labs Reviewed  RPR  HIV ANTIBODY (ROUTINE TESTING)  HSV 1 ANTIBODY, IGG  HSV 2 ANTIBODY, IGG  POCT URINALYSIS DIP (DEVICE)  URINE CYTOLOGY ANCILLARY ONLY    Imaging Review No results found.    MDM   1. Concern about STD in male without diagnosis   2. History of herpes genitalis    Pt requested to be re-tested for HSV as well as other STDs including GC/chlamydia, HIV and syphilis   Agreeable to be treated for HSV with Valtrex again. Pt would like to hold off on treatment for GC/chlamydia. Will wait til results come back. F/u with PCP.    Encouraged to refrain from sexual intercourse for 7 days. Be sure to have all partners tested and treated for STDs.  This may be  performed by your primary care provider or the health department.  Practice safe sex by always using condoms.    Noland Fordyce, PA-C 12/12/15 [phone]

## 2015-12-12 NOTE — ED Notes (Signed)
Patient is unable to void at this time 

## 2015-12-12 NOTE — ED Notes (Signed)
The patient presented to the [facility] with a complaint of requesting to be checked for STDs. The patient denied any penile discharge but did state that "i'm breaking out."

## 2015-12-13 LAB — URINE CYTOLOGY ANCILLARY ONLY
Chlamydia: NEGATIVE
Neisseria Gonorrhea: NEGATIVE

## 2015-12-13 LAB — RPR: RPR Ser Ql: NONREACTIVE

## 2015-12-13 LAB — HSV 2 ANTIBODY, IGG: HSV 2 Glycoprotein G Ab, IgG: 5.93 index — ABNORMAL HIGH (ref 0.00–0.90)

## 2015-12-13 LAB — HIV ANTIBODY (ROUTINE TESTING W REFLEX): HIV Screen 4th Generation wRfx: NONREACTIVE

## 2015-12-13 LAB — HSV 1 ANTIBODY, IGG: HSV 1 Glycoprotein G Ab, IgG: 9.01 index — ABNORMAL HIGH (ref 0.00–0.90)

## 2015-12-16 ENCOUNTER — Telehealth (HOSPITAL_COMMUNITY): Payer: Self-pay | Admitting: Emergency Medicine

## 2015-12-16 NOTE — Telephone Encounter (Signed)
Called pt and notified of recent lab results from visit 7/13 Pt ID'd properly... Reports feeling better and sx have subsided States he will come and p/u copy of lab results... Left at front office.  Adv pt if sx are not getting better to return  Pt verb understanding Education on safe sex given  Per Dr. Valere Dross,   Notes Recorded by Sherlene Shams, MD on 12/16/2015 at 12:05 PM Please let patient know that test for herpes virus (HSV antibodies) was positive for type 1 and type 2 HSV; this test reveals a previous/old infection and does not given information about a new infection or flareup. Tests for gonorrhea/chlamydia, HIV, and syphilis (RPR) were negative. Recheck as needed. LM

## 2015-12-25 ENCOUNTER — Encounter (HOSPITAL_COMMUNITY): Payer: Self-pay | Admitting: Family Medicine

## 2015-12-25 ENCOUNTER — Ambulatory Visit (HOSPITAL_COMMUNITY)
Admission: EM | Admit: 2015-12-25 | Discharge: 2015-12-25 | Disposition: A | Discharge status: Home / Self Care | Payer: Medicare Other | Attending: Family Medicine | Admitting: Family Medicine

## 2015-12-25 DIAGNOSIS — L03311 Cellulitis of abdominal wall: Secondary | ICD-10-CM

## 2015-12-25 MED ORDER — CEPHALEXIN 500 MG PO CAPS: 500.0000 mg | ORAL_CAPSULE | Freq: Three times a day (TID) | ORAL | 0 refills | Status: DC

## 2015-12-25 MED ORDER — MUPIROCIN 2 % EX OINT: TOPICAL_OINTMENT | CUTANEOUS | 0 refills | Status: DC

## 2015-12-25 NOTE — ED Provider Notes (Signed)
[place]    CSN: [id number] Arrival date & time: 12/25/15  [id number]  First Provider Contact:  First MD Initiated Contact with Patient 12/25/15 1658        History   Chief Complaint Chief Complaint  Patient presents with  . Abscess    HPI Scott Farrell is a 35 y.o. male.   The history is provided by the patient.  Abscess  Location:  Pelvis Pelvic abscess location:  Pelvis Size:  1.5cm Abscess quality: induration, painful and redness   Abscess quality: not draining, no fluctuance and no itching   Red streaking: no   Duration:  2 days Progression:  Unchanged Pain details:    Quality:  Burning   Severity:  Mild Chronicity:  New Relieved by:  None tried Worsened by:  Nothing Ineffective treatments:  None tried Associated symptoms: no fever   Risk factors: no prior abscess     Past Medical History:  Diagnosis Date  . Clotting disorder ([place])    personal hx/o? etiology unclear  . Costochondritis    history of  . 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 History of echocardiogram 01/07/11   normal LV function, EF 60%, trace mitral, tricuspid and pulmonic regurgitation; Dr. Tollie Eth  . IBS (irritable bowel syndrome)   . Mental disorder 2013   hospitalized for psychiatric illness prior  . Normal cardiac stress test 01/07/11   negative treadmill test, no evidence of ischemia, good exercise capacity; Tollie Eth, MD;   . Prostatitis 11/2010   Dr. Karsten Ro  . Renal stone 06/2012   Urology consult  . Schizophrenia [facility])    [facility], [place] - Dr. Lennon Alstrom  . Tobacco use disorder     Patient Active Problem List   Diagnosis Date Noted  . Unspecified constipation 04/27/2012  . Family history of malignant neoplasm of gastrointestinal tract 04/27/2012  . 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 EUS  06/30/2012   Procedure: UPPER ENDOSCOPIC ULTRASOUND (EUS) LINEAR;  Surgeon: Milus Banister, MD;  [facility]: [facility];  Service: Endoscopy;  Laterality: N/A;  radial linear  . WISDOM TOOTH EXTRACTION         Home Medications    Prior to Admission medications   Medication Sig Start Date End Date Taking? Authorizing Provider  butalbital-acetaminophen-caffeine (FIORICET) 50-325-40 MG tablet Take 1-2 tablets by mouth every 8 (eight) hours as needed for headache. 07/13/15 07/12/16  Antonietta Breach, PA-C  cephALEXin (KEFLEX) 500 MG capsule Take 1 capsule (500 mg total) by mouth 3 (three) times daily. Take all of medicine and drink lots of fluids 07/23/15   Billy Fischer, MD  dicyclomine (BENTYL) 20 MG tablet Take 1 tablet (20 mg total) by mouth 2 (two) times daily. Patient taking differently: Take 20 mg by mouth 2 (two) times daily as needed for spasms (stomach pain).  01/03/15   Domenic Moras, PA-C  divalproex (DEPAKOTE ER) 250 MG 24 hr tablet Take 250 mg by mouth daily.     Historical Provider, MD  fluticasone (FLONASE) 50 MCG/ACT nasal spray Place 2 sprays into both nostrils daily. Patient not taking: Reported on 07/12/2015 04/10/15   Mercedes Camprubi-Soms, PA-C  ibuprofen (ADVIL,MOTRIN) 200 MG tablet Take 600 mg by mouth every 6 (six) hours as needed (pain).    Historical Provider, MD  naproxen (NAPROSYN) 500 MG tablet Take 1 tablet (500 mg total) by mouth 2 (two) times daily as needed for mild pain, moderate pain or  headache (TAKE WITH MEALS.). Patient not taking: Reported on 07/12/2015 04/10/15   Mercedes Camprubi-Soms, PA-C  oxyCODONE-acetaminophen (PERCOCET) 5-325 MG tablet Take 1-2 tablets by mouth every 4 (four) hours as needed. Patient taking differently: Take 1-2 tablets by mouth every 4 (four) hours as needed (pain).  06/06/15   Margarita Mail, PA-C  Paliperidone Palmitate (INVEGA SUSTENNA IM) Inject 1 each into the muscle every 30 (thirty) days. Next injection due 1st week of February 2017    Historical Provider, MD  PRESCRIPTION MEDICATION Take 2 tablets by mouth at bedtime. depakote    Historical  Provider, MD  triamcinolone cream (KENALOG) 0.1 % Apply 1 application topically 3 (three) times daily. 07/23/15   Billy Fischer, MD  valACYclovir (VALTREX) 1000 MG tablet Take 1 tablet (1,000 mg total) by mouth 2 (two) times daily. For 7 days for outbreaks 12/12/15   Noland Fordyce, PA-C    Family History Family History  Problem Relation Age of Onset  . Hypertension Mother   . Stroke Mother   . Depression Mother   . Acne Mother   . Thyroid disease Mother   . Diabetes Mother   . Colon cancer Mother 46  . Acute lymphoblastic leukemia Father   . Diabetes Father   . Cancer Brother   . Breast cancer Maternal Aunt   . Ovarian cancer Maternal Aunt     Social History Social History  Substance Use Topics  . Smoking status: Current Every Day Smoker    Packs/day: 0.25    Types: Cigarettes, Cigars  . Smokeless tobacco: Never Used  . Alcohol use Yes     Comment: occ     Allergies   Review of patient's allergies indicates no known allergies.   Review of Systems Review of Systems  Constitutional: Negative for fever.  Skin: Positive for rash.  All other systems reviewed and are negative.    Physical Exam Triage Vital Signs ED Triage Vitals  Enc Vitals Group     BP 12/25/15 1659 121/73     Pulse Rate 12/25/15 1659 68     Resp 12/25/15 1659 18     Temp 12/25/15 1659 98.1 F (36.7 C)     Temp src --      SpO2 12/25/15 1659 97 %     Weight --      Height --      Head Circumference --      Peak Flow --      Pain Score 12/25/15 1700 7     Pain Loc --      Pain Edu? --      Excl. in [place]? --    No data found.   Updated Vital Signs BP 121/73   Pulse 68   Temp 98.1 F (36.7 C)   Resp 18   SpO2 97%   Visual Acuity Right Eye Distance:   Left Eye Distance:   Bilateral Distance:    Right Eye Near:   Left Eye Near:    Bilateral Near:     Physical Exam  Constitutional: He appears well-developed and well-nourished.  Skin: Skin is warm and dry.  Oval lesion in  midline suprapubic hair approx 1.5cm in length, no fluctuance or drainage.  Nursing note and vitals reviewed.    UC Treatments / Results  Labs (all labs ordered are listed, but only abnormal results are displayed) Labs Reviewed - No data to display  EKG  EKG Interpretation None       Radiology No  results found.  Procedures Procedures (including critical care time)  Medications Ordered in UC Medications - No data to display   Initial Impression / Assessment and Plan / UC Course  I have reviewed the triage vital signs and the nursing notes.  Pertinent labs & imaging results that were available during my care of the patient were reviewed by me and considered in my medical decision making (see chart for details).  Clinical Course      Final Clinical Impressions(s) / UC Diagnoses   Final diagnoses:  None    New Prescriptions New Prescriptions   No medications on file     Billy Fischer, MD 12/25/15 1717

## 2015-12-25 NOTE — ED Triage Notes (Signed)
Pt here with abscess to groin area that started a few days ago . sts it was hard but softened up after heat compresses. Area reddened.

## 2016-01-08 DIAGNOSIS — F2081 Schizophreniform disorder: Secondary | ICD-10-CM | POA: Diagnosis not present

## 2016-01-20 ENCOUNTER — Encounter (HOSPITAL_COMMUNITY): Payer: Self-pay | Admitting: Emergency Medicine

## 2016-01-20 ENCOUNTER — Ambulatory Visit (HOSPITAL_COMMUNITY)
Admission: EM | Admit: 2016-01-20 | Discharge: 2016-01-20 | Disposition: A | Discharge status: Home / Self Care | Payer: Medicare Other | Attending: Family Medicine | Admitting: Family Medicine

## 2016-01-20 DIAGNOSIS — J9801 Acute bronchospasm: Secondary | ICD-10-CM | POA: Diagnosis not present

## 2016-01-20 DIAGNOSIS — R0982 Postnasal drip: Secondary | ICD-10-CM

## 2016-01-20 DIAGNOSIS — R05 Cough: Secondary | ICD-10-CM

## 2016-01-20 DIAGNOSIS — F2081 Schizophreniform disorder: Secondary | ICD-10-CM | POA: Diagnosis not present

## 2016-01-20 DIAGNOSIS — R0789 Other chest pain: Secondary | ICD-10-CM

## 2016-01-20 DIAGNOSIS — R059 Cough, unspecified: Secondary | ICD-10-CM

## 2016-01-20 MED ORDER — ALBUTEROL SULFATE HFA 108 (90 BASE) MCG/ACT IN AERS: 2.0000 | INHALATION_SPRAY | RESPIRATORY_TRACT | 0 refills | Status: DC | PRN

## 2016-01-20 NOTE — ED Triage Notes (Signed)
The patient presented to the [facility] with a complaint of a cough, chest congestion and chest wall pain x 1 week. The patient stated that he is having heavy mucus production.

## 2016-01-20 NOTE — Discharge Instructions (Signed)
Your cough is primarily due to the large amount of drainage or having from your sinuses. This is likely due to allergies or a summer cold. The treatment is the same. User inhaler every 4 hours as needed for wheeze especially before bedtime. For nasal and head congestion may take Sudafed PE 10 mg every 4 hours as needed. Saline nasal spray used frequently. For drainage may use Allegra, Claritin or Zyrtec. If you need stronger medicine to stop drainage may take Chlor-Trimeton 2-4 mg every 4 hours. This may cause drowsiness. Ibuprofen 600 mg every 6 hours as needed for pain, discomfort or fever. Drink plenty of fluids and stay well-hydrated.

## 2016-01-20 NOTE — ED Provider Notes (Signed)
CSN: [id number]     Arrival date & time 01/20/16  1038 History   First MD Initiated Contact with Patient 01/20/16 1201     Chief Complaint  Patient presents with  . Cough   (Consider location/radiation/quality/duration/timing/severity/associated sxs/prior Treatment) 35 year old male complaining of a cough, PND,, chest congestion and anterior chest discomfort associated with a cough for one week. Positive for mild sore throat. He smokes an average of one half pack per day. He denies taking any medications for his symptoms.      Past Medical History:  Diagnosis Date  . Clotting disorder ([place])    personal hx/o? etiology unclear  . Costochondritis    history of  . 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 History of echocardiogram 01/07/11   normal LV function, EF 60%, trace mitral, tricuspid and pulmonic regurgitation; Dr. Tollie Eth  . IBS (irritable bowel syndrome)   . Mental disorder 2013   hospitalized for psychiatric illness prior  . Normal cardiac stress test 01/07/11   negative treadmill test, no evidence of ischemia, good exercise capacity; Tollie Eth, MD;   . Prostatitis 11/2010   Dr. Karsten Ro  . Renal stone 06/2012   Urology consult  . Schizophrenia [facility])    [facility], [place] - Dr. Lennon Alstrom  . Tobacco use disorder    Past Surgical History:  Procedure Laterality Date  . EUS  06/30/2012   Procedure: UPPER ENDOSCOPIC ULTRASOUND (EUS) LINEAR;  Surgeon: Milus Banister, MD;  Location: [facility];  Service: Endoscopy;  Laterality: N/A;  radial linear  . WISDOM TOOTH EXTRACTION     Family History  Problem Relation Age of Onset  . Hypertension Mother   . Stroke Mother   . Depression Mother   . Acne Mother   . Thyroid disease Mother   . Diabetes Mother   . Colon cancer Mother 44  . Acute lymphoblastic leukemia Father   . Diabetes Father   . Cancer Brother   . Breast cancer Maternal Aunt   . Ovarian cancer Maternal Aunt    Social History  Substance Use Topics  .  Smoking status: Current Every Day Smoker    Packs/day: 0.25    Types: Cigarettes, Cigars  . Smokeless tobacco: Never Used  . Alcohol use Yes     Comment: occ    Review of Systems  Constitutional: Negative for activity change, diaphoresis, fatigue and fever.  HENT: Positive for congestion, postnasal drip, rhinorrhea and sore throat. Negative for ear pain, facial swelling and trouble swallowing.   Eyes: Negative for pain, discharge and redness.  Respiratory: Positive for cough. Negative for chest tightness.   Cardiovascular: Positive for chest pain.  Gastrointestinal: Negative.   Musculoskeletal: Negative.  Negative for neck pain and neck stiffness.  Neurological: Negative.   All other systems reviewed and are negative.   Allergies  Review of patient's allergies indicates no known allergies.  Home Medications   Prior to Admission medications   Medication Sig Start Date End Date Taking? Authorizing Provider  divalproex (DEPAKOTE ER) 250 MG 24 hr tablet Take 250 mg by mouth daily.    Yes Historical Provider, MD  Paliperidone Palmitate (INVEGA SUSTENNA IM) Inject 1 each into the muscle every 30 (thirty) days. Next injection due 1st week of February 2017   Yes Historical Provider, MD  butalbital-acetaminophen-caffeine [facility]) [phone] MG tablet Take 1-2 tablets by mouth every 8 (eight) hours as needed for headache. 07/13/15 07/12/16  Antonietta Breach, PA-C  cephALEXin (KEFLEX) 500 MG capsule Take 1 capsule (  500 mg total) by mouth 3 (three) times daily. Take all of medicine and drink lots of fluids 07/23/15   Billy Fischer, MD  cephALEXin (KEFLEX) 500 MG capsule Take 1 capsule (500 mg total) by mouth 3 (three) times daily. Take all of medicine and drink lots of fluids 12/25/15   Billy Fischer, MD  dicyclomine (BENTYL) 20 MG tablet Take 1 tablet (20 mg total) by mouth 2 (two) times daily. Patient taking differently: Take 20 mg by mouth 2 (two) times daily as needed for spasms (stomach pain).   01/03/15   Domenic Moras, PA-C  fluticasone (FLONASE) 50 MCG/ACT nasal spray Place 2 sprays into both nostrils daily. Patient not taking: Reported on 07/12/2015 04/10/15   Mercedes Camprubi-Soms, PA-C  ibuprofen (ADVIL,MOTRIN) 200 MG tablet Take 600 mg by mouth every 6 (six) hours as needed (pain).    Historical Provider, MD  mupirocin ointment (BACTROBAN) 2 % Apply to skin bid 12/25/15   Billy Fischer, MD  naproxen (NAPROSYN) 500 MG tablet Take 1 tablet (500 mg total) by mouth 2 (two) times daily as needed for mild pain, moderate pain or headache (TAKE WITH MEALS.). Patient not taking: Reported on 07/12/2015 04/10/15   Mercedes Camprubi-Soms, PA-C  oxyCODONE-acetaminophen (PERCOCET) 5-325 MG tablet Take 1-2 tablets by mouth every 4 (four) hours as needed. Patient taking differently: Take 1-2 tablets by mouth every 4 (four) hours as needed (pain).  06/06/15   Margarita Mail, PA-C  PRESCRIPTION MEDICATION Take 2 tablets by mouth at bedtime. depakote    Historical Provider, MD  triamcinolone cream (KENALOG) 0.1 % Apply 1 application topically 3 (three) times daily. 07/23/15   Billy Fischer, MD  valACYclovir (VALTREX) 1000 MG tablet Take 1 tablet (1,000 mg total) by mouth 2 (two) times daily. For 7 days for outbreaks 12/12/15   Noland Fordyce, PA-C   Meds Ordered and Administered this Visit  Medications - No data to display  BP 101/67 (BP Location: Right Arm)   Pulse 77   Temp 98.4 F (36.9 C) (Oral)   Resp 16   SpO2 98%  No data found.   Physical Exam  Constitutional: He is oriented to person, place, and time. He appears well-developed and well-nourished. No distress.  HENT:  Head: Normocephalic and atraumatic.  Mouth/Throat: No oropharyngeal exudate.  Oropharynx with minor erythema and clear PND.  Eyes: EOM are normal.  Neck: Normal range of motion. Neck supple.  Cardiovascular: Normal rate and regular rhythm.   Pulmonary/Chest: Effort normal. No respiratory distress.  Forced expiration reveals  distant wheeze. Otherwise, breath sounds are normal with good expansion.  Musculoskeletal: Normal range of motion. He exhibits no edema.  Lymphadenopathy:    He has no cervical adenopathy.  Neurological: He is alert and oriented to person, place, and time.  Skin: Skin is warm and dry. No rash noted.  Psychiatric: He has a normal mood and affect.  Nursing note and vitals reviewed.   Urgent Care Course   Clinical Course    Procedures (including critical care time)  Labs Review Labs Reviewed - No data to display  Imaging Review No results found.   Visual Acuity Review  Right Eye Distance:   Left Eye Distance:   Bilateral Distance:    Right Eye Near:   Left Eye Near:    Bilateral Near:         MDM   1. PND (post-nasal drip)   2. Cough   3. Chest wall pain   4.  Bronchospasm    Your cough is primarily due to the large amount of drainage or having from your sinuses. This is likely due to allergies or a summer cold. The treatment is the same. User inhaler every 4 hours as needed for wheeze especially before bedtime. For nasal and head congestion may take Sudafed PE 10 mg every 4 hours as needed. Saline nasal spray used frequently. For drainage may use Allegra, Claritin or Zyrtec. If you need stronger medicine to stop drainage may take Chlor-Trimeton 2-4 mg every 4 hours. This may cause drowsiness. Ibuprofen 600 mg every 6 hours as needed for pain, discomfort or fever. Drink plenty of fluids and stay well-hydrated. No orders of the defined types were placed in this encounter.      Janne Napoleon, NP 01/20/16 1257

## 2016-02-05 DIAGNOSIS — F2081 Schizophreniform disorder: Secondary | ICD-10-CM | POA: Diagnosis not present

## 2016-03-03 DIAGNOSIS — F2081 Schizophreniform disorder: Secondary | ICD-10-CM | POA: Diagnosis not present

## 2016-03-24 DIAGNOSIS — F2081 Schizophreniform disorder: Secondary | ICD-10-CM | POA: Diagnosis not present

## 2016-04-21 DIAGNOSIS — F2081 Schizophreniform disorder: Secondary | ICD-10-CM | POA: Diagnosis not present

## 2016-05-19 ENCOUNTER — Ambulatory Visit (INDEPENDENT_AMBULATORY_CARE_PROVIDER_SITE_OTHER): Payer: Worker's Compensation

## 2016-05-19 ENCOUNTER — Ambulatory Visit
Admission: EM | Admit: 2016-05-19 | Discharge: 2016-05-19 | Disposition: A | Discharge status: Home / Self Care | Payer: Worker's Compensation | Attending: Emergency Medicine | Admitting: Emergency Medicine

## 2016-05-19 DIAGNOSIS — S39012A Strain of muscle, fascia and tendon of lower back, initial encounter: Secondary | ICD-10-CM | POA: Diagnosis not present

## 2016-05-19 DIAGNOSIS — F2081 Schizophreniform disorder: Secondary | ICD-10-CM | POA: Diagnosis not present

## 2016-05-19 MED ORDER — TRAMADOL HCL 50 MG PO TABS: ORAL_TABLET | ORAL | 0 refills | Status: DC

## 2016-05-19 MED ORDER — TIZANIDINE HCL 4 MG PO TABS: 4.0000 mg | ORAL_TABLET | Freq: Three times a day (TID) | ORAL | 0 refills | Status: DC | PRN

## 2016-05-19 MED ORDER — PREDNISONE 50 MG PO TABS
60.0000 mg | ORAL_TABLET | Freq: Once | ORAL | Status: AC
  Administered 2016-05-19: 60 mg via ORAL

## 2016-05-19 MED ORDER — KETOROLAC TROMETHAMINE 60 MG/2ML IM SOLN
60.0000 mg | Freq: Once | INTRAMUSCULAR | Status: AC
  Administered 2016-05-19: 60 mg via INTRAMUSCULAR

## 2016-05-19 MED ORDER — DICLOFENAC SODIUM 75 MG PO TBEC: 75.0000 mg | DELAYED_RELEASE_TABLET | Freq: Two times a day (BID) | ORAL | 0 refills | Status: DC

## 2016-05-19 NOTE — ED Triage Notes (Signed)
Pt was carrying a heavy box and strained his lower back. He feels like it is in a spasm

## 2016-05-19 NOTE — ED Notes (Signed)
Urine Drug Screen Collected

## 2016-05-19 NOTE — ED Provider Notes (Signed)
HPI  SUBJECTIVE:  Scott Farrell is a 35 y.o. male who presents with the acute onset onset of midline and right lower back pain starting after lifting a heavy object yesterday. States that he was walking backwards, tripped over a pallet and lost his balance. States this placed a heavy strain on his back. He describes  back pain as stabbing, intermittent, seconds, like spasms. It is present depending on his position. He tried ibuprofen 400 mg which helped his symptoms. He also states standing up helps. Symptoms are worse with bending and sitting for prolonged periods of time. He reports tingling radiating down his right inner thigh to his knee. denies  fevers, flank pain, abdominal pain, urinary urgency, frequency, dysuria, cloudy or odorous urine, hematuria.  No syncope. No saddle anesthesia, distal weakness/numbness, bilateral radicular leg pain/weakness, fevers/night sweats, direct trauma, neurological deficits,  bladder/ bowel incontinence, urinary retention, h/o CA / multiple myleoma, unexplained weight loss, pain worse at night,  h/o prolonged steroid use, h/o osteopenia, h/o IVDU, h/o HIV, known AAA.  He has a past medical history of irritable bowel syndrome. No history of GI bleed, kidney disease. He has never hurt his back before. 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    Past Medical History:  Diagnosis Date  . Clotting disorder ([place])    personal hx/o? etiology unclear  . Costochondritis    history of  . 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 History of echocardiogram 01/07/11   normal LV function, EF 60%, trace mitral, tricuspid and pulmonic regurgitation; Dr. Tollie Eth  . IBS (irritable bowel syndrome)   . Mental disorder 2013   hospitalized for psychiatric illness prior  . Normal cardiac stress test 01/07/11   negative treadmill test, no evidence of ischemia, good exercise capacity; Tollie Eth, MD;   . Prostatitis 11/2010   Dr. Karsten Ro  . Renal stone 06/2012   Urology consult  . Schizophrenia  [facility])    [facility], [place] - Dr. Lennon Alstrom  . Tobacco use disorder     Past Surgical History:  Procedure Laterality Date  . EUS  06/30/2012   Procedure: UPPER ENDOSCOPIC ULTRASOUND (EUS) LINEAR;  Surgeon: Milus Banister, MD;  [facility];  Service: Endoscopy;  Laterality: N/A;  radial linear  . WISDOM TOOTH EXTRACTION      Family History  Problem Relation Age of Onset  . Hypertension Mother   . Stroke Mother   . Depression Mother   . Acne Mother   . Thyroid disease Mother   . Diabetes Mother   . Colon cancer Mother 42  . Acute lymphoblastic leukemia Father   . Diabetes Father   . Cancer Brother   . Breast cancer Maternal Aunt   . Ovarian cancer Maternal Aunt     Social History  Substance Use Topics  . Smoking status: Current Every Day Smoker    Packs/day: 0.25    Types: Cigarettes, Cigars  . Smokeless tobacco: Never Used  . Alcohol use Yes     Comment: occ    No current facility-administered medications for this encounter.   Current Outpatient Prescriptions:  .  divalproex (DEPAKOTE ER) 250 MG 24 hr tablet, Take 250 mg by mouth daily. , Disp: , Rfl:  .  Paliperidone Palmitate (INVEGA SUSTENNA IM), Inject 1 each into the muscle every 30 (thirty) days. Next injection due 1st week of February 2017, Disp: , Rfl:  .  PRESCRIPTION MEDICATION, Take 2 tablets by mouth at bedtime. depakote, Disp: , Rfl:  .  valACYclovir (VALTREX) 1000 MG tablet, Take 1 tablet (1,000 mg total) by mouth 2 (two) times daily. For 7 days for outbreaks, Disp: 14 tablet, Rfl: 2 .  diclofenac (VOLTAREN) 75 MG EC tablet, Take 1 tablet (75 mg total) by mouth 2 (two) times daily. Take with food, Disp: 30 tablet, Rfl: 0 .  tiZANidine (ZANAFLEX) 4 MG tablet, Take 1 tablet (4 mg total) by mouth every 8 (eight) hours as needed for muscle spasms., Disp: 30 tablet, Rfl: 0 .  traMADol (ULTRAM) 50 MG tablet, 1-2 tabs po q 6 hr prn pain Maximum dose= 8 tablets per day, Disp: 20 tablet, Rfl:  0  No Known Allergies   ROS  As noted in HPI.   Physical Exam  BP 124/72 (BP Location: Left Arm)   Pulse 61   Temp 97.8 F (36.6 C) (Oral)   Resp 18   Ht 6\' 4"  (1.93 m)   Wt 242 lb (109.8 kg)   SpO2 100%   BMI 29.46 kg/m   Constitutional: Well developed, well nourished, no acute distress Eyes:  EOMI, conjunctiva normal bilaterally HENT: Normocephalic, atraumatic,mucus membranes moist Respiratory: Normal inspiratory effort Cardiovascular: Normal rate GI: nondistended. No suprapubic tenderness skin: No rash, skin intact Musculoskeletal: no CVAT. + R paralumbar tenderness, +  muscle spasm. + bony tenderness At L5-S1. Bilateral lower extremities nontender, baseline ROM with intact DP pulses, pain with active right hip flexion against resistance. No pain with passive int/ext rotation flex/extension hips bilaterally. SLR neg bilaterally. Sensation baseline light touch bilaterally for Pt, DTR's symmetric and intact bilaterally KJ, Motor symmetric bilateral 5/5 hip flexion, quadriceps, hamstrings, EHL, foot dorsiflexion, foot plantarflexion, gait somewhat antalgic but without apparent new ataxia. Neurologic: Alert & oriented x 3, no focal neuro deficits Psychiatric: Speech and behavior appropriate   ED Course   Medications  ketorolac (TORADOL) injection 60 mg (60 mg Intramuscular Given 05/19/16 1704)  predniSONE (DELTASONE) tablet 60 mg (60 mg Oral Given 05/19/16 1705)    Orders Placed This Encounter  Procedures  . DG Lumbar Spine Complete    Standing Status:   Standing    Number of Occurrences:   1    Order Specific Question:   Reason for Exam (SYMPTOM  OR DIAGNOSIS REQUIRED)    Answer:   back pain r/o acute changes    No results found for this or any previous visit (from the past 24 hour(s)). Dg Lumbar Spine Complete  Result Date: 05/19/2016 CLINICAL DATA:  Low back injury while lifting heavy object. Low back pain. Initial encounter. EXAM: LUMBAR SPINE - COMPLETE 4+  VIEW COMPARISON:  None. FINDINGS: There is no evidence of lumbar spine fracture. Alignment is normal. Intervertebral disc spaces are maintained. No evidence of facet arthropathy or other significant bone abnormality. IMPRESSION: Negative. Electronically Signed   By: Earle Gell M.D.   On: 05/19/2016 17:31    ED Clinical Impression  Strain of lumbar region, initial encounter   ED Assessment/Plan  [place] narcotic database reviewed. Pt with no opiate or narcotic prescriptions in the past 6 months.   uds collected.  Pt given toradol and prednisone 60 mg po.   Reviewed imaging independently. Normal LS films. See radiology report for details.  Pt with significant reduction in pain after pain meds. No new findings on re-exam. Pt ambulatory in the ED. Home with NSAID, tramadol, muscle relaxants, steriod, advised deep tissue massage.. Pt to f/u with [place] at [facility].  Discussed imaging,  medical decision-making, and plan for  follow-up with the patient.  Discussed signs and symptoms that should prompt return to the emergency department.  Patient agrees with plan.  *This clinic note was created using Dragon dictation software. Therefore, there may be occasional mistakes despite careful proofreading.  ?    Melynda Ripple, MD 05/19/16 1747

## 2016-07-21 DIAGNOSIS — F2081 Schizophreniform disorder: Secondary | ICD-10-CM | POA: Diagnosis not present

## 2016-09-02 DIAGNOSIS — F2081 Schizophreniform disorder: Secondary | ICD-10-CM | POA: Diagnosis not present

## 2016-09-03 DIAGNOSIS — F2081 Schizophreniform disorder: Secondary | ICD-10-CM | POA: Diagnosis not present

## 2016-09-30 DIAGNOSIS — F2081 Schizophreniform disorder: Secondary | ICD-10-CM | POA: Diagnosis not present

## 2016-10-08 DIAGNOSIS — F2081 Schizophreniform disorder: Secondary | ICD-10-CM | POA: Diagnosis not present

## 2016-11-06 DIAGNOSIS — F2081 Schizophreniform disorder: Secondary | ICD-10-CM | POA: Diagnosis not present

## 2016-11-23 ENCOUNTER — Emergency Department (HOSPITAL_COMMUNITY)
Admission: EM | Admit: 2016-11-23 | Discharge: 2016-11-23 | Disposition: A | Discharge status: Home / Self Care | Payer: Medicare HMO | Attending: Emergency Medicine | Admitting: Emergency Medicine

## 2016-11-23 ENCOUNTER — Encounter (HOSPITAL_COMMUNITY): Payer: Self-pay

## 2016-11-23 DIAGNOSIS — Z79899 Other long term (current) drug therapy: Secondary | ICD-10-CM | POA: Diagnosis not present

## 2016-11-23 DIAGNOSIS — L02412 Cutaneous abscess of left axilla: Secondary | ICD-10-CM | POA: Insufficient documentation

## 2016-11-23 DIAGNOSIS — M6281 Muscle weakness (generalized): Secondary | ICD-10-CM | POA: Diagnosis not present

## 2016-11-23 DIAGNOSIS — F17210 Nicotine dependence, cigarettes, uncomplicated: Secondary | ICD-10-CM | POA: Diagnosis not present

## 2016-11-23 MED ORDER — SULFAMETHOXAZOLE-TRIMETHOPRIM 800-160 MG PO TABS
1.0000 | ORAL_TABLET | Freq: Once | ORAL | Status: AC
  Administered 2016-11-23: 1 via ORAL
  Filled 2016-11-23: qty 1

## 2016-11-23 MED ORDER — SULFAMETHOXAZOLE-TRIMETHOPRIM 800-160 MG PO TABS
1.0000 | ORAL_TABLET | Freq: Two times a day (BID) | ORAL | 0 refills | Status: AC
Start: 2016-11-23 — End: 2016-11-30

## 2016-11-23 MED ORDER — LIDOCAINE HCL 2 % IJ SOLN
20.0000 mL | Freq: Once | INTRAMUSCULAR | Status: AC
  Administered 2016-11-23: 400 mg
  Filled 2016-11-23: qty 20

## 2016-11-23 MED ORDER — HYDROCODONE-ACETAMINOPHEN 5-325 MG PO TABS: 1.0000 | ORAL_TABLET | Freq: Four times a day (QID) | ORAL | 0 refills | Status: DC | PRN

## 2016-11-23 NOTE — ED Provider Notes (Signed)
[facility] DEPT Provider Note   CSN: [id number] Arrival date & time: 11/23/16  1213    History   Chief Complaint Chief Complaint  Patient presents with  . Abscess    HPI Scott Farrell is a 36 y.o. male.  HPI   36 year old male presents today with complaints of abscess to his right axilla.  Patient notes symptoms started approximately a week and half ago with pain and swelling.  He notes he gets these in his groin from time to time usually is able to resolve them with warm compress.  Patient had symptoms have worsened, pain to the axilla with surrounding redness.  He denies any fever, no systemic symptoms.  Patient also reports he has had intermittent weakness in his lower extremities, none presently.  No neurological deficits associated with this, denies back pain, urinary complaints, or any other significant findings.  Patient reports he does have minor back pain from time to time he describes his lumbar, worse in the mornings.  Patient notes his intermittent weakness last 20-30 minutes in the morning after waking up and completely resolve.  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  He denies any infectious etiology, denies any IV drug use or any significant red flags for back pain.    Past Medical History:  Diagnosis Date  . Clotting disorder ([place])    personal hx/o? etiology unclear  . Costochondritis    history of  . 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 History of echocardiogram 01/07/11   normal LV function, EF 60%, trace mitral, tricuspid and pulmonic regurgitation; Dr. Tollie Eth  . IBS (irritable bowel syndrome)   . Mental disorder 2013   hospitalized for psychiatric illness prior  . Normal cardiac stress test 01/07/11   negative treadmill test, no evidence of ischemia, good exercise capacity; Tollie Eth, MD;   . Prostatitis 11/2010   Dr. Karsten Ro  . Renal stone 06/2012   Urology consult  . Schizophrenia [facility])    [facility], [place] - Dr. Lennon Alstrom  .  Tobacco use disorder     Patient Active Problem List   Diagnosis Date Noted  . Unspecified constipation 04/27/2012  . Family history of malignant neoplasm of gastrointestinal tract 04/27/2012  . 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 EUS  06/30/2012   Procedure: UPPER ENDOSCOPIC ULTRASOUND (EUS) LINEAR;  Surgeon: Milus Banister, MD;  [facility];  Service: Endoscopy;  Laterality: N/A;  radial linear  . WISDOM TOOTH EXTRACTION       Home Medications    Prior to Admission medications   Medication Sig Start Date End Date Taking? Authorizing Provider  diclofenac (VOLTAREN) 75 MG EC tablet Take 1 tablet (75 mg total) by mouth 2 (two) times daily. Take with food 05/19/16   Melynda Ripple, MD  divalproex (DEPAKOTE ER) 250 MG 24 hr tablet Take 250 mg by mouth daily.     [provider]  HYDROcodone-acetaminophen (NORCO/VICODIN) 5-325 MG tablet Take 1 tablet by mouth every 6 (six) hours as needed for moderate pain. 11/23/16   Shown Dissinger, Dellis Filbert, PA-C  Paliperidone Palmitate (INVEGA SUSTENNA IM) Inject 1 each into the muscle every 30 (thirty) days. Next injection due 1st week of February 2017    [provider]  PRESCRIPTION MEDICATION Take 2 tablets by mouth at bedtime. depakote    [provider]  tiZANidine (ZANAFLEX) 4 MG tablet Take 1 tablet (4 mg total) by mouth every 8 (eight) hours as needed for muscle  spasms. 05/19/16   Melynda Ripple, MD  traMADol (ULTRAM) 50 MG tablet 1-2 tabs po q 6 hr prn pain Maximum dose= 8 tablets per day 05/19/16   Melynda Ripple, MD  valACYclovir (VALTREX) 1000 MG tablet Take 1 tablet (1,000 mg total) by mouth 2 (two) times daily. For 7 days for outbreaks 12/12/15   Noe Gens, PA-C    Family History Family History  Problem Relation Age of Onset  . Hypertension Mother   . Stroke Mother   . Depression Mother   . Acne Mother   . Thyroid disease Mother   . Diabetes  Mother   . Colon cancer Mother 3  . Acute lymphoblastic leukemia Father   . Diabetes Father   . Cancer Brother   . Breast cancer Maternal Aunt   . Ovarian cancer Maternal Aunt     Social History Social History  Substance Use Topics  . Smoking status: Current Every Day Smoker    Packs/day: 0.25    Types: Cigarettes, Cigars  . Smokeless tobacco: Never Used  . Alcohol use Yes     Comment: occ     Allergies   Patient has no known allergies.   Review of Systems Review of Systems  All other systems reviewed and are negative.   Physical Exam Updated Vital Signs BP 115/78 (BP Location: Left Arm)   Pulse 70   Temp 98.5 F (36.9 C) (Oral)   Resp 16   Ht 6\' 4"  (1.93 m)   Wt 106.6 kg (235 lb)   SpO2 97%   BMI 28.61 kg/m   Physical Exam  Constitutional: He is oriented to person, place, and time. He appears well-developed and well-nourished.  HENT:  Head: Normocephalic and atraumatic.  Eyes: Conjunctivae are normal. Pupils are equal, round, and reactive to light. Right eye exhibits no discharge. Left eye exhibits no discharge. No scleral icterus.  Neck: Normal range of motion. No JVD present. No tracheal deviation present.  Pulmonary/Chest: Effort normal. No stridor.  Musculoskeletal:  No CT or L-spine tenderness to palpation, moderate tenderness to palpation of the mid lumbar paravertebral musculature no redness swelling warmth to touch  Neurological: He is alert and oriented to person, place, and time. Coordination normal.  Skin:  2 cm abscess to the right axilla with surrounding cellulitis  Psychiatric: He has a normal mood and affect. His behavior is normal. Judgment and thought content normal.  Nursing note and vitals reviewed.    ED Treatments / Results  Labs (all labs ordered are listed, but only abnormal results are displayed) Labs Reviewed - No data to display  EKG  EKG Interpretation None       Radiology No results found.  Procedures .Marland KitchenIncision  and Drainage Date/Time: 11/23/2016 7:50 PM Performed by: Penni Bombard, Shameca Landen Authorized by: Penni Bombard, Xxavier Noon   Consent:    Consent obtained:  Verbal   Consent given by:  Patient   Risks discussed:  Bleeding, incomplete drainage, infection, pain and damage to other organs   Alternatives discussed:  No treatment, delayed treatment, alternative treatment, observation and referral Location:    Type:  Abscess   Size:  2   Location:  Upper extremity   Upper extremity location: Axilla. Pre-procedure details:    Skin preparation:  Betadine Anesthesia (see MAR for exact dosages):    Anesthesia method:  Local infiltration   Local anesthetic:  Lidocaine 2% w/o epi Procedure type:    Complexity:  Simple Procedure details:    Needle aspiration: no  Incision types:  Single straight   Incision depth:  Dermal   Scalpel blade:  11   Wound management:  Probed and deloculated and irrigated with saline   Drainage:  Bloody and purulent   Drainage amount:  Moderate   Wound treatment:  Wound left open   Packing materials:  None Post-procedure details:    Patient tolerance of procedure:  Tolerated well, no immediate complications   (including critical care time)  Medications Ordered in ED Medications  lidocaine (XYLOCAINE) 2 % (with pres) injection 400 mg (400 mg Infiltration Given 11/23/16 1849)     Initial Impression / Assessment and Plan / ED Course  I have reviewed the triage vital signs and the nursing notes.  Pertinent labs & imaging results that were available during my care of the patient were reviewed by me and considered in my medical decision making (see chart for details).     Final Clinical Impressions(s) / ED Diagnoses   Final diagnoses:  Abscess of left axilla   Labs:    Imaging:   Consults:  Therapeutics: Lidocaine  Discharge Meds:   Assessment/Plan: 36 year old male presents today with abscess to right axilla.  Patient has surrounding cellulitis.  No significant  history of infections, no MRSA risk factors.  No signs of systemic illness.  I&D successful, outpatient antibiotics, close follow-up.  Patient also having intermittent complaints of weakness in his bilateral lower extremities.  He reports this is worse after waking up.  Question radiation and impingement secondary to new mattress and sleeping.  He has no persistent symptoms, none presently.  He will refer to neurosurgery for evaluation, return precautions, no red flags that would sustain further evaluation or management here in the ED setting.  Patient is given strict return precautions, he verbalized understanding and agreement to this plan and no further questions or concerns.   New Prescriptions New Prescriptions   HYDROCODONE-ACETAMINOPHEN (NORCO/VICODIN) 5-325 MG TABLET    Take 1 tablet by mouth every 6 (six) hours as needed for moderate pain.     Okey Regal, PA-C 11/23/16 1956    Charlesetta Shanks, MD 12/04/16 [phone]

## 2016-11-23 NOTE — ED Triage Notes (Addendum)
Pt states he has abscess to the right axilla. Has been there X2 weeks. He also reports in the morning he feels as though his legs are very weak and would like that to be addressed as well. No acute distress noted. Pt ambulatory to triage.

## 2016-11-23 NOTE — Discharge Instructions (Signed)
Please read attached information. If you experience any new or worsening signs or symptoms please return to the emergency room for evaluation. Please follow-up with your primary care provider or specialist as discussed. Please use medication prescribed only as directed and discontinue taking if you have any concerning signs or symptoms.   °

## 2016-11-23 NOTE — ED Notes (Signed)
Pt st's he has had a abscess under right arm x's 1 1/2 weeks.  No drainage noted at this time

## 2016-12-22 DIAGNOSIS — F2081 Schizophreniform disorder: Secondary | ICD-10-CM | POA: Diagnosis not present

## 2016-12-30 DIAGNOSIS — Z79899 Other long term (current) drug therapy: Secondary | ICD-10-CM | POA: Diagnosis not present

## 2016-12-30 DIAGNOSIS — F2081 Schizophreniform disorder: Secondary | ICD-10-CM | POA: Diagnosis not present

## 2017-01-13 DIAGNOSIS — F2081 Schizophreniform disorder: Secondary | ICD-10-CM | POA: Diagnosis not present

## 2017-02-05 DIAGNOSIS — F2081 Schizophreniform disorder: Secondary | ICD-10-CM | POA: Diagnosis not present

## 2017-02-19 DIAGNOSIS — F2081 Schizophreniform disorder: Secondary | ICD-10-CM | POA: Diagnosis not present

## 2017-03-02 DIAGNOSIS — F2081 Schizophreniform disorder: Secondary | ICD-10-CM | POA: Diagnosis not present

## 2017-03-29 DIAGNOSIS — F2081 Schizophreniform disorder: Secondary | ICD-10-CM | POA: Diagnosis not present

## 2017-04-01 DIAGNOSIS — F2081 Schizophreniform disorder: Secondary | ICD-10-CM | POA: Diagnosis not present

## 2017-05-18 DIAGNOSIS — F2081 Schizophreniform disorder: Secondary | ICD-10-CM | POA: Diagnosis not present

## 2017-05-26 ENCOUNTER — Ambulatory Visit (HOSPITAL_COMMUNITY)
Admission: EM | Admit: 2017-05-26 | Discharge: 2017-05-26 | Disposition: A | Discharge status: Home / Self Care | Payer: Medicare HMO | Attending: Family Medicine | Admitting: Family Medicine

## 2017-05-26 ENCOUNTER — Encounter (HOSPITAL_COMMUNITY): Payer: Self-pay | Admitting: Emergency Medicine

## 2017-05-26 DIAGNOSIS — M25561 Pain in right knee: Secondary | ICD-10-CM | POA: Diagnosis not present

## 2017-05-26 MED ORDER — DICLOFENAC SODIUM 75 MG PO TBEC: 75.0000 mg | DELAYED_RELEASE_TABLET | Freq: Two times a day (BID) | ORAL | 0 refills | Status: DC

## 2017-05-26 NOTE — ED Triage Notes (Signed)
PT C/O: here for pain in the back of the right knee .... Reports he was washing his mothers car when he almost fell but was able to catch himself.... Pain increases when he walks or tries to squat.   ONSET: 2 days  TAKING MEDS: OTC pain meds.   A&O x4... NAD... Ambulatory

## 2017-05-26 NOTE — ED Provider Notes (Signed)
Scott Farrell   [id number] 05/26/17 Arrival Time: 1209  ASSESSMENT & PLAN:  1. Acute pain of right knee     Meds ordered this encounter  Medications  . diclofenac (VOLTAREN) 75 MG EC tablet    Sig: Take 1 tablet (75 mg total) by mouth 2 (two) times daily. Take with food    Dispense:  14 tablet    Refill:  0   No obvious abnormalities on exam. Will monitor. WBAT. Will f/u with PCP or here if not showing significant improvement over the next week. Reviewed expectations re: course of current medical issues. Questions answered. Outlined signs and symptoms indicating need for more acute intervention. Patient verbalized understanding. After Visit Summary given.   SUBJECTIVE: History from: patient. Scott Farrell is a 36 y.o. male who reports fairly persistent pain of his L knee. Onset abrupt apprxo 2 days ago while washing a car. "Slipped a little and twisted my knee. Pain started almost right away." No previous inuries to L knee reported. No trauma. Certain movements exacerbate a sharp pain on L side of knee mainly. Has been able to bear weight since injury. No OTC treatment. Rest improves. No extremity sensation changes or weakness.   ROS: As per HPI.   OBJECTIVE:  Vitals:   05/26/17 1259  BP: 119/72  Pulse: 67  Resp: 16  Temp: 98.1 F (36.7 C)  TempSrc: Oral  SpO2: 97%    General appearance: alert; no distress Extremities: no cyanosis or edema; symmetrical with no gross deformities; tenderness over his medial knee with no swelling and no bruising; full range of motion with reported discomfort over medial knee; normal strength; no instability appreciated CV: normal extremity capillary refill Skin: warm and dry Neurologic: normal gait; normal symmetric reflexes in all extremities; normal sensation in all extremities Psychological: alert and cooperative; normal mood and affect  No Known Allergies  Past Medical History:  Diagnosis Date  . Clotting disorder  ([place])    personal hx/o? etiology unclear  . Costochondritis    history of  . 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 History of echocardiogram 01/07/11   normal LV function, EF 60%, trace mitral, tricuspid and pulmonic regurgitation; Dr. Tollie Eth  . IBS (irritable bowel syndrome)   . Mental disorder 2013   hospitalized for psychiatric illness prior  . Normal cardiac stress test 01/07/11   negative treadmill test, no evidence of ischemia, good exercise capacity; Tollie Eth, MD;   . Prostatitis 11/2010   Dr. Karsten Ro  . Renal stone 06/2012   Urology consult  . Schizophrenia [facility])    [facility], [place] - Dr. Lennon Alstrom  . Tobacco use disorder    Social History   Socioeconomic History  . Marital status: Single    Spouse name: Not on file  . Number of children: Not on file  . Years of education: Not on file  . Highest education level: Not on file  Social Needs  . Financial resource strain: Not on file  . Food insecurity - worry: Not on file  . Food insecurity - inability: Not on file  . Transportation needs - medical: Not on file  . Transportation needs - non-medical: Not on file  Occupational History  . Not on file  Tobacco Use  . Smoking status: Current Every Day Smoker    Packs/day: 0.25    Types: Cigarettes, Cigars  . Smokeless tobacco: Never Used  Substance and Sexual Activity  . Alcohol use: Yes    Comment: occ  .  Drug use: No  . Sexual activity: Yes  Other Topics Concern  . Not on file  Social History Narrative   Lives with mother, [name], exercising with walking, running, no current relationship; unemployed   Family History  Problem Relation Age of Onset  . Hypertension Mother   . Stroke Mother   . Depression Mother   . Acne Mother   . Thyroid disease Mother   . Diabetes Mother   . Colon cancer Mother 23  . Acute lymphoblastic leukemia Father   . Diabetes Father   . Cancer Brother   . Breast cancer Maternal Aunt   . Ovarian cancer Maternal Aunt     Past Surgical History:  Procedure Laterality Date  . EUS  06/30/2012   Procedure: UPPER ENDOSCOPIC ULTRASOUND (EUS) LINEAR;  Surgeon: Milus Banister, MD;  Location: [facility];  Service: Endoscopy;  Laterality: N/A;  radial linear  . WISDOM TOOTH EXTRACTION       Vanessa Kick, MD 05/26/17 1320

## 2017-06-13 ENCOUNTER — Emergency Department (HOSPITAL_COMMUNITY)
Admission: EM | Admit: 2017-06-13 | Discharge: 2017-06-13 | Disposition: A | Discharge status: Home / Self Care | Payer: Medicare HMO | Attending: Emergency Medicine | Admitting: Emergency Medicine

## 2017-06-13 ENCOUNTER — Encounter (HOSPITAL_COMMUNITY): Payer: Self-pay | Admitting: Emergency Medicine

## 2017-06-13 ENCOUNTER — Emergency Department (HOSPITAL_COMMUNITY): Payer: Medicare HMO

## 2017-06-13 DIAGNOSIS — F17210 Nicotine dependence, cigarettes, uncomplicated: Secondary | ICD-10-CM | POA: Insufficient documentation

## 2017-06-13 DIAGNOSIS — Z79899 Other long term (current) drug therapy: Secondary | ICD-10-CM | POA: Diagnosis not present

## 2017-06-13 DIAGNOSIS — H6121 Impacted cerumen, right ear: Secondary | ICD-10-CM | POA: Diagnosis not present

## 2017-06-13 DIAGNOSIS — H6120 Impacted cerumen, unspecified ear: Secondary | ICD-10-CM | POA: Diagnosis not present

## 2017-06-13 DIAGNOSIS — L729 Follicular cyst of the skin and subcutaneous tissue, unspecified: Secondary | ICD-10-CM | POA: Diagnosis not present

## 2017-06-13 DIAGNOSIS — H6122 Impacted cerumen, left ear: Secondary | ICD-10-CM | POA: Diagnosis not present

## 2017-06-13 DIAGNOSIS — M25561 Pain in right knee: Secondary | ICD-10-CM | POA: Diagnosis not present

## 2017-06-13 MED ORDER — IBUPROFEN 200 MG PO TABS
600.0000 mg | ORAL_TABLET | Freq: Once | ORAL | Status: DC
Start: 2017-06-13 — End: 2017-06-13
  Filled 2017-06-13: qty 1

## 2017-06-13 MED ORDER — CARBAMIDE PEROXIDE 6.5 % OT SOLN: 5.0000 [drp] | Freq: Two times a day (BID) | OTIC | 0 refills | Status: DC

## 2017-06-13 NOTE — Discharge Instructions (Signed)
X-ray shows no evidence of a fracture or other acute abnormalities.  Ibuprofen or Tylenol, as well as knee sleeve to help with knee pain, please call to schedule an appointment with Dr. Mable Paris with orthopedics for follow-up.  Bump on forehead is likely a cyst, will please follow-up with your primary care doctor regarding this.  If area becomes red, warm or larger, these are signs of infection that should warrant sooner evaluation.  For earwax buildup you can use Debrox drops to help soften wax, avoid using Q-tips in the ear as this can injure the eardrum.  Follow-up with your primary doctor regarding this as well, no evidence of ear infection today.

## 2017-06-13 NOTE — ED Triage Notes (Signed)
Pt states almost fell and has pain to right knee, 1 month ago. Seen for this already at urgent care. Also has a cyst to forehead. Also wants his ears checked for ear wax.

## 2017-06-13 NOTE — ED Notes (Signed)
Declined W/C at D/C and was escorted to lobby by RN. 

## 2017-06-13 NOTE — ED Provider Notes (Signed)
[facility] EMERGENCY DEPARTMENT Provider Note   CSN: [id number] Arrival date & time: 06/13/17  1354     History   Chief Complaint Chief Complaint  Patient presents with  . Knee Injury  . Cyst    HPI  Scott Farrell is a 37 y.o. Male who presents to the ED for evaluation of right knee pain.  Patient reports several weeks ago patient was helping wash her car almost slipped and caught himself, and twisted his knee.  Patient has had pain in the knee since then, denies swelling, erythema or warmth, no fevers or chills.  Patient locates pain over the limited lateral and medial aspects, as well as over the back of the knee.  Patient was seen on 12/26 at urgent care regarding this knee pain, no x-ray at that time, was prescribed NSAIDs and knee sleeve, patient reported some improvement with that but now patient is still persisting.  No follow-up with orthopedics at this time.  Patient denies any numbness or weakness, no pain distal to the knee.  Patient has continued to bear weight on the knee, but does report it is uncomfortable.  Patient also would like to be evaluated regarding bump that has been present on his forehead for greater than 1 year.  Patient reports a small soft, squishy bump on the right side of his forehead, just below the hairline.  Reports this is been there for a long time has not changed at all in size.  Patient reports only pain when he messes with it and touches it.  Denies any headaches.  No other bumps similar, has not been able to see his PCP regarding this.  He is also requesting to have ears checked, reports he often has wax buildup and sometimes has muffled hearing, currently denying any ear pain.      Past Medical History:  Diagnosis Date  . Clotting disorder ([place])    personal hx/o? etiology unclear  . Costochondritis    history of  . 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 History of echocardiogram 01/07/11   normal LV function, EF 60%, trace mitral,  tricuspid and pulmonic regurgitation; Dr. Tollie Eth  . IBS (irritable bowel syndrome)   . Mental disorder 2013   hospitalized for psychiatric illness prior  . Normal cardiac stress test 01/07/11   negative treadmill test, no evidence of ischemia, good exercise capacity; Tollie Eth, MD;   . Prostatitis 11/2010   Dr. Karsten Ro  . Renal stone 06/2012   Urology consult  . Schizophrenia [facility]    [facility], [place] - Dr. Lennon Alstrom  . Tobacco use disorder     Patient Active Problem List   Diagnosis Date Noted  . Unspecified constipation 04/27/2012  . Family history of malignant neoplasm of gastrointestinal tract 04/27/2012  . 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 EUS  06/30/2012   Procedure: UPPER ENDOSCOPIC ULTRASOUND (EUS) LINEAR;  Surgeon: Milus Banister, MD;  Location: [facility];  Service: Endoscopy;  Laterality: N/A;  radial linear  . WISDOM TOOTH EXTRACTION         Home Medications    Prior to Admission medications   Medication Sig Start Date End Date Taking? Authorizing Provider  diclofenac (VOLTAREN) 75 MG EC tablet Take 1 tablet (75 mg total) by mouth 2 (two) times daily. Take with food 05/26/17   Vanessa Kick, MD  divalproex (DEPAKOTE ER) 250 MG 24 hr tablet Take 250 mg by mouth  daily.     [provider]  HYDROcodone-acetaminophen (NORCO/VICODIN) 5-325 MG tablet Take 1 tablet by mouth every 6 (six) hours as needed for moderate pain. 11/23/16   Hedges, Dellis Filbert, PA-C  Paliperidone Palmitate (INVEGA SUSTENNA IM) Inject 1 each into the muscle every 30 (thirty) days. Next injection due 1st week of February 2017    [provider]  PRESCRIPTION MEDICATION Take 2 tablets by mouth at bedtime. depakote    [provider]  tiZANidine (ZANAFLEX) 4 MG tablet Take 1 tablet (4 mg total) by mouth every 8 (eight) hours as needed for muscle spasms. 05/19/16   Melynda Ripple, MD  traMADol (ULTRAM) 50  MG tablet 1-2 tabs po q 6 hr prn pain Maximum dose= 8 tablets per day 05/19/16   Melynda Ripple, MD  valACYclovir (VALTREX) 1000 MG tablet Take 1 tablet (1,000 mg total) by mouth 2 (two) times daily. For 7 days for outbreaks 12/12/15   Noe Gens, PA-C    Family History Family History  Problem Relation Age of Onset  . Hypertension Mother   . Stroke Mother   . Depression Mother   . Acne Mother   . Thyroid disease Mother   . Diabetes Mother   . Colon cancer Mother 41  . Acute lymphoblastic leukemia Father   . Diabetes Father   . Cancer Brother   . Breast cancer Maternal Aunt   . Ovarian cancer Maternal Aunt     Social History Social History   Tobacco Use  . Smoking status: Current Every Day Smoker    Packs/day: 0.25    Types: Cigarettes, Cigars  . Smokeless tobacco: Never Used  Substance Use Topics  . Alcohol use: Yes    Comment: occ  . Drug use: No     Allergies   Patient has no known allergies.   Review of Systems Review of Systems  Constitutional: Negative for chills and fever.  HENT: Negative for congestion, ear discharge, ear pain, facial swelling, hearing loss, rhinorrhea and sore throat.        Bump on forehead  Eyes: Negative for visual disturbance.  Musculoskeletal: Positive for arthralgias (R knee). Negative for joint swelling.  Skin: Negative for rash and wound.  Neurological: Negative for headaches.     Physical Exam Updated Vital Signs BP 125/74   Pulse 80   Temp 98.7 F (37.1 C)   Resp 19   SpO2 98%   Physical Exam  Constitutional: He appears well-developed and well-nourished. No distress.  HENT:  Head: Normocephalic and atraumatic.    Left ear with moderate amount of wax present, still able to fully visualize TM, TM normal with good cone of light Right ear with minimal wax present, TM fully visualized, normal with good cone of light  Eyes: Right eye exhibits no discharge. Left eye exhibits no discharge.  Pulmonary/Chest: Effort  normal. No respiratory distress.  Musculoskeletal:  Right knee tender to palpation over medial and less posteriorly, no palpable deformity, erythema or warmth, no appreciable swelling or effusion.  Patient able to fully bend and extend the knee with some discomfort.  Mild pain with varus and valgus stress.  No pain distal to the knee or distal swelling.  DP and TP pulses 2+, sensation intact, normal strength with dorsi and plantarflexion, as well as flexion and extension of the knee  Neurological: He is alert. Coordination normal.  Skin: Skin is warm and dry. He is not diaphoretic.  Psychiatric: He has a normal mood and affect.  His behavior is normal.  Nursing note and vitals reviewed.    ED Treatments / Results  Labs (all labs ordered are listed, but only abnormal results are displayed) Labs Reviewed - No data to display  EKG  EKG Interpretation None       Radiology Dg Knee Complete 4 Views Right  Result Date: 06/13/2017 CLINICAL DATA:  Pain to the right knee after fall 1 month ago. EXAM: RIGHT KNEE - COMPLETE 4+ VIEW COMPARISON:  None. FINDINGS: No evidence of fracture, dislocation, or joint effusion. No evidence of arthropathy or other focal bone abnormality. Soft tissues are unremarkable. IMPRESSION: Negative. Electronically Signed   By: Abelardo Diesel M.D.   On: 06/13/2017 14:54    Procedures Procedures (including critical care time)  Medications Ordered in ED Medications  ibuprofen (ADVIL,MOTRIN) tablet 600 mg (600 mg Oral Not Given 06/13/17 1521)     Initial Impression / Assessment and Plan / ED Course  I have reviewed the triage vital signs and the nursing notes.  Pertinent labs & imaging results that were available during my care of the patient were reviewed by me and considered in my medical decision making (see chart for details).  Multiple complaints, persistent right knee pain, previously evaluated at urgent care, since patient presents for a second time will get  right knee x-ray, although low suspicion for fracture. right lower extremity is neurovascularly intact, able to bend and extend the knee, no appreciable effusion, no concern for septic joint.  X-ray shows no acute fracture, will have patient continue to treat with NSAIDs and start wearing the sleeve again, and follow-up with orthopedics for further evaluation.  Patient also requests evaluation of the ears has had issues in the past with wax buildup, moderate wax present, but bilateral TMs visible without evidence of infection, will prescribe Debrox drops counseled patient on avoiding use of Q-tips.  Patient also expresses concern over bump on right side of forehead, has been present for greater than 1 year, no change in size, no signs of infection on exam, no cellulitis.  Likely a cyst, will have patient follow-up with PCP for further management, will likely need referral to dermatology.   At this time there does not appear to be any evidence of an acute emergency medical condition and the patient appears stable for discharge with appropriate outpatient follow up.  Return precautions discussed, patient expresses understanding and is in agreement with plan.  Final Clinical Impressions(s) / ED Diagnoses   Final diagnoses:  Acute pain of right knee  Scalp cyst  Wax in ear    ED Discharge Orders        Ordered    carbamide peroxide (DEBROX) 6.5 % OTIC solution  2 times daily     06/13/17 1500       Benedetto Goad [place], [place] 06/13/17 1642    Daleen Bo, MD 06/14/17 1801

## 2017-06-15 DIAGNOSIS — F2081 Schizophreniform disorder: Secondary | ICD-10-CM | POA: Diagnosis not present

## 2017-06-23 DIAGNOSIS — F2081 Schizophreniform disorder: Secondary | ICD-10-CM | POA: Diagnosis not present

## 2017-07-07 DIAGNOSIS — F2081 Schizophreniform disorder: Secondary | ICD-10-CM | POA: Diagnosis not present

## 2017-07-14 DIAGNOSIS — F2081 Schizophreniform disorder: Secondary | ICD-10-CM | POA: Diagnosis not present

## 2017-07-27 DIAGNOSIS — F2081 Schizophreniform disorder: Secondary | ICD-10-CM | POA: Diagnosis not present

## 2017-08-15 ENCOUNTER — Encounter: Payer: Self-pay | Admitting: Gastroenterology

## 2017-08-18 DIAGNOSIS — F2081 Schizophreniform disorder: Secondary | ICD-10-CM | POA: Diagnosis not present

## 2017-09-09 DIAGNOSIS — F2081 Schizophreniform disorder: Secondary | ICD-10-CM | POA: Diagnosis not present

## 2017-10-05 DIAGNOSIS — F2081 Schizophreniform disorder: Secondary | ICD-10-CM | POA: Diagnosis not present

## 2017-12-28 DIAGNOSIS — F321 Major depressive disorder, single episode, moderate: Secondary | ICD-10-CM | POA: Diagnosis not present

## 2018-01-27 ENCOUNTER — Ambulatory Visit (INDEPENDENT_AMBULATORY_CARE_PROVIDER_SITE_OTHER): Payer: Medicare HMO | Admitting: Orthopaedic Surgery

## 2018-01-27 DIAGNOSIS — L84 Corns and callosities: Secondary | ICD-10-CM | POA: Diagnosis not present

## 2018-01-27 NOTE — Progress Notes (Signed)
Office Visit Note   Patient: Scott Farrell           Date of Birth: March 01, 1981           MRN: [id number] Visit Date: 01/27/2018              Requested by: Glendale Chard, [address] [place], [place] [address] PCP: Glendale Chard, MD   Assessment & Plan: Visit Diagnoses:  1. Callus of foot     Plan: From my standpoint these appear to be typical calluses.  I recommend proper shoe wear and evaluation by podiatrist if he feels like he wants these to be shaved down.  Follow-up as needed.  Follow-Up Instructions: Return if symptoms worsen or fail to improve.   Orders:  No orders of the defined types were placed in this encounter.  No orders of the defined types were placed in this encounter.     Procedures: No procedures performed   Clinical Data: No additional findings.   Subjective: Chief Complaint  Patient presents with  . Left Foot - Pain    Scott Farrell comes in today for evaluation of left foot calluses.  He denies any constitutional symptoms.  The pain is worse with walking there is no pain at rest.  He denies any recent injuries.   Review of Systems  Constitutional: Negative.   All other systems reviewed and are negative.    Objective: Vital Signs: There were no vitals taken for this visit.  Physical Exam  Constitutional: He is oriented to person, place, and time. He appears well-developed and well-nourished.  HENT:  Head: Normocephalic and atraumatic.  Eyes: Pupils are equal, round, and reactive to light.  Neck: Neck supple.  Pulmonary/Chest: Effort normal.  Abdominal: Soft.  Musculoskeletal: Normal range of motion.  Neurological: He is alert and oriented to person, place, and time.  Skin: Skin is warm.  Psychiatric: He has a normal mood and affect. His behavior is normal. Judgment and thought content normal.  Nursing note and vitals reviewed.   Ortho Exam Left foot exam shows a small callus on the medial aspect of the great  toe as well as the plantar callus on the fifth metatarsal head.  There is no evidence of infection or any concerning symptoms or signs. Specialty Comments:  No specialty comments available.  Imaging: No results found.   PMFS History: Patient Active Problem List   Diagnosis Date Noted  . Callus of foot 01/27/2018  . Unspecified constipation 04/27/2012  . Family history of malignant neoplasm of gastrointestinal tract 04/27/2012  . Gastric mass 04/21/2012   Past Medical History:  Diagnosis Date  . Clotting disorder ([place])    personal hx/o? etiology unclear  . Costochondritis    history of  . 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 History of echocardiogram 01/07/11   normal LV function, EF 60%, trace mitral, tricuspid and pulmonic regurgitation; Dr. Tollie Eth  . IBS (irritable bowel syndrome)   . Mental disorder 2013   hospitalized for psychiatric illness prior  . Normal cardiac stress test 01/07/11   negative treadmill test, no evidence of ischemia, good exercise capacity; Tollie Eth, MD;   . Prostatitis 11/2010   Dr. Karsten Ro  . Renal stone 06/2012   Urology consult  . Schizophrenia [facility])    [facility], [place] - Dr. Lennon Alstrom  . Tobacco use disorder     Family History  Problem Relation Age of Onset  . Hypertension Mother   . Stroke Mother   .  Depression Mother   . Acne Mother   . Thyroid disease Mother   . Diabetes Mother   . Colon cancer Mother 89  . Acute lymphoblastic leukemia Father   . Diabetes Father   . Cancer Brother   . Breast cancer Maternal Aunt   . Ovarian cancer Maternal Aunt     Past Surgical History:  Procedure Laterality Date  . EUS  06/30/2012   Procedure: UPPER ENDOSCOPIC ULTRASOUND (EUS) LINEAR;  Surgeon: Milus Banister, MD;  [facility];  Service: Endoscopy;  Laterality: N/A;  radial linear  . WISDOM TOOTH EXTRACTION     Social History   Occupational History  . Not on file  Tobacco Use  . Smoking status: Current Every Day  Smoker    Packs/day: 0.25    Types: Cigarettes, Cigars  . Smokeless tobacco: Never Used  Substance and Sexual Activity  . Alcohol use: Yes    Comment: occ  . Drug use: No  . Sexual activity: Yes

## 2018-03-23 DIAGNOSIS — F2081 Schizophreniform disorder: Secondary | ICD-10-CM | POA: Diagnosis not present

## 2018-03-24 DIAGNOSIS — F2081 Schizophreniform disorder: Secondary | ICD-10-CM | POA: Diagnosis not present

## 2018-04-12 DIAGNOSIS — F2081 Schizophreniform disorder: Secondary | ICD-10-CM | POA: Diagnosis not present

## 2018-04-21 DIAGNOSIS — F2081 Schizophreniform disorder: Secondary | ICD-10-CM | POA: Diagnosis not present

## 2018-05-10 ENCOUNTER — Telehealth: Payer: Self-pay | Admitting: Internal Medicine

## 2018-05-10 NOTE — Telephone Encounter (Signed)
Pt appears on [facility] Quality Report for [facility], but has never been seen in the office.  I called the pt and asked if he has a PCP.  He said that he didn't and requested to be seen here, so I scheduled a new patient appointment.  VDM (DD)

## 2018-05-11 DIAGNOSIS — F2081 Schizophreniform disorder: Secondary | ICD-10-CM | POA: Diagnosis not present

## 2018-05-19 ENCOUNTER — Ambulatory Visit: Payer: Medicare HMO | Admitting: Internal Medicine

## 2018-06-15 ENCOUNTER — Telehealth: Payer: Self-pay | Admitting: General Practice

## 2018-06-15 NOTE — Telephone Encounter (Signed)
I called the pt and left a message asking him to reschedule the new patient appointment that was missed in Dec. 2019. VDM (DD)

## 2018-06-21 DIAGNOSIS — F2081 Schizophreniform disorder: Secondary | ICD-10-CM | POA: Diagnosis not present

## 2018-06-22 DIAGNOSIS — H52223 Regular astigmatism, bilateral: Secondary | ICD-10-CM | POA: Diagnosis not present

## 2018-11-25 DIAGNOSIS — F2081 Schizophreniform disorder: Secondary | ICD-10-CM | POA: Diagnosis not present

## 2018-11-25 DIAGNOSIS — F250 Schizoaffective disorder, bipolar type: Secondary | ICD-10-CM | POA: Diagnosis not present

## 2019-01-17 ENCOUNTER — Telehealth: Payer: Self-pay | Admitting: Internal Medicine

## 2019-01-17 NOTE — Telephone Encounter (Signed)
Pt appears on [facility] Quality Report for [facility], but has never been seen in the office.  I called the pt to confirm their PCP, but there was no answer and no option to leave a message.  VDM (DD)

## 2019-02-22 DIAGNOSIS — F251 Schizoaffective disorder, depressive type: Secondary | ICD-10-CM | POA: Diagnosis not present

## 2019-02-28 ENCOUNTER — Other Ambulatory Visit: Payer: Self-pay

## 2019-02-28 ENCOUNTER — Encounter (HOSPITAL_COMMUNITY): Payer: Self-pay | Admitting: Emergency Medicine

## 2019-02-28 ENCOUNTER — Ambulatory Visit (HOSPITAL_COMMUNITY)
Admission: EM | Admit: 2019-02-28 | Discharge: 2019-02-28 | Disposition: A | Discharge status: Home / Self Care | Payer: Medicare HMO | Attending: Family Medicine | Admitting: Family Medicine

## 2019-02-28 DIAGNOSIS — Z800 Family history of malignant neoplasm of digestive organs: Secondary | ICD-10-CM | POA: Diagnosis not present

## 2019-02-28 DIAGNOSIS — Z833 Family history of diabetes mellitus: Secondary | ICD-10-CM | POA: Insufficient documentation

## 2019-02-28 DIAGNOSIS — Z79899 Other long term (current) drug therapy: Secondary | ICD-10-CM | POA: Diagnosis not present

## 2019-02-28 DIAGNOSIS — R5383 Other fatigue: Secondary | ICD-10-CM | POA: Diagnosis not present

## 2019-02-28 DIAGNOSIS — Z8041 Family history of malignant neoplasm of ovary: Secondary | ICD-10-CM | POA: Diagnosis not present

## 2019-02-28 DIAGNOSIS — Z818 Family history of other mental and behavioral disorders: Secondary | ICD-10-CM | POA: Insufficient documentation

## 2019-02-28 DIAGNOSIS — F209 Schizophrenia, unspecified: Secondary | ICD-10-CM | POA: Diagnosis not present

## 2019-02-28 DIAGNOSIS — Z0189 Encounter for other specified special examinations: Secondary | ICD-10-CM

## 2019-02-28 DIAGNOSIS — Z806 Family history of leukemia: Secondary | ICD-10-CM | POA: Diagnosis not present

## 2019-02-28 DIAGNOSIS — Z803 Family history of malignant neoplasm of breast: Secondary | ICD-10-CM | POA: Diagnosis not present

## 2019-02-28 DIAGNOSIS — R6883 Chills (without fever): Secondary | ICD-10-CM | POA: Insufficient documentation

## 2019-02-28 DIAGNOSIS — F17210 Nicotine dependence, cigarettes, uncomplicated: Secondary | ICD-10-CM | POA: Insufficient documentation

## 2019-02-28 DIAGNOSIS — Z1159 Encounter for screening for other viral diseases: Secondary | ICD-10-CM

## 2019-02-28 DIAGNOSIS — Z20828 Contact with and (suspected) exposure to other viral communicable diseases: Secondary | ICD-10-CM | POA: Diagnosis not present

## 2019-02-28 DIAGNOSIS — Z791 Long term (current) use of non-steroidal anti-inflammatories (NSAID): Secondary | ICD-10-CM | POA: Diagnosis not present

## 2019-02-28 NOTE — Discharge Instructions (Addendum)
Your COVID test is pending.  You should self quarantine until your test result is back and is negative.   ° °Go to the emergency department if you develop high fever, shortness of breath, severe diarrhea, or other concerning symptoms.   ° °

## 2019-02-28 NOTE — ED Provider Notes (Signed)
[place]    CSN: [id number] Arrival date & time: 02/28/19  1653      History   Chief Complaint Chief Complaint  Patient presents with  . Fatigue    HPI Scott Farrell is a 38 y.o. male.   Patient presents with request for COVID test.  He states he has fatigue and chills.  He denies fever, cough, shortness of breath, diarrhea, or other symptoms.  He has no known exposure to COVID.  The history is provided by the patient.    Past Medical History:  Diagnosis Date  . Clotting disorder ([place])    personal hx/o? etiology unclear  . Costochondritis    history of  . 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 History of echocardiogram 01/07/11   normal LV function, EF 60%, trace mitral, tricuspid and pulmonic regurgitation; Dr. Tollie Eth  . IBS (irritable bowel syndrome)   . Mental disorder 2013   hospitalized for psychiatric illness prior  . Normal cardiac stress test 01/07/11   negative treadmill test, no evidence of ischemia, good exercise capacity; Tollie Eth, MD;   . Prostatitis 11/2010   Dr. Karsten Ro  . Renal stone 06/2012   Urology consult  . Schizophrenia [facility])    [facility], [place] - Dr. Lennon Alstrom  . Tobacco use disorder     Patient Active Problem List   Diagnosis Date Noted  . Callus of foot 01/27/2018  . Unspecified constipation 04/27/2012  . Family history of malignant neoplasm of gastrointestinal tract 04/27/2012  . 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 EUS  06/30/2012   Procedure: UPPER ENDOSCOPIC ULTRASOUND (EUS) LINEAR;  Surgeon: Milus Banister, MD;  Location: [facility] ENDOSCOPY;  Service: Endoscopy;  Laterality: N/A;  radial linear  . WISDOM TOOTH EXTRACTION         Home Medications    Prior to Admission medications   Medication Sig Start Date End Date Taking? Authorizing Provider  carbamide peroxide (DEBROX) 6.5 % OTIC solution Place 5 drops into both ears 2 (two) times daily. 06/13/17   Jacqlyn Larsen, PA-C  diclofenac (VOLTAREN) 75 MG EC tablet Take 1 tablet (75 mg total) by mouth 2 (two) times daily. Take with food 05/26/17   Vanessa Kick, MD  divalproex (DEPAKOTE ER) 250 MG 24 hr tablet Take 250 mg by mouth daily.     [provider]  HYDROcodone-acetaminophen (NORCO/VICODIN) 5-325 MG tablet Take 1 tablet by mouth every 6 (six) hours as needed for moderate pain. 11/23/16   Hedges, Dellis Filbert, PA-C  Paliperidone Palmitate (INVEGA SUSTENNA IM) Inject 1 each into the muscle every 30 (thirty) days. Next injection due 1st week of February 2017    [provider]  PRESCRIPTION MEDICATION Take 2 tablets by mouth at bedtime. depakote    [provider]  tiZANidine (ZANAFLEX) 4 MG tablet Take 1 tablet (4 mg total) by mouth every 8 (eight) hours as needed for muscle spasms. 05/19/16   Melynda Ripple, MD  traMADol (ULTRAM) 50 MG tablet 1-2 tabs po q 6 hr prn pain Maximum dose= 8 tablets per day Patient not taking: Reported on 02/28/2019 05/19/16   Melynda Ripple, MD  valACYclovir (VALTREX) 1000 MG tablet Take 1 tablet (1,000 mg total) by mouth 2 (two) times daily. For 7 days for outbreaks 12/12/15   Noe Gens, PA-C    Family History Family History  Problem Relation Age of Onset  . Hypertension Mother   .  Stroke Mother   . Depression Mother   . Acne Mother   . Thyroid disease Mother   . Diabetes Mother   . Colon cancer Mother 58  . Acute lymphoblastic leukemia Father   . Diabetes Father   . Cancer Brother   . Breast cancer Maternal Aunt   . Ovarian cancer Maternal Aunt     Social History Social History   Tobacco Use  . Smoking status: Current Every Day Smoker    Packs/day: 0.25    Types: Cigarettes, Cigars  . Smokeless tobacco: Never Used  Substance Use Topics  . Alcohol use: Yes    Comment: occ  . Drug use: No     Allergies   Patient has no known allergies.   Review of Systems Review of Systems  Constitutional: Positive for chills  and fatigue. Negative for fever.  HENT: Negative for congestion, ear pain, rhinorrhea and sore throat.   Eyes: Negative for pain and visual disturbance.  Respiratory: Negative for cough and shortness of breath.   Cardiovascular: Negative for chest pain and palpitations.  Gastrointestinal: Negative for abdominal pain, diarrhea and vomiting.  Genitourinary: Negative for dysuria and hematuria.  Musculoskeletal: Negative for arthralgias and back pain.  Skin: Negative for color change and rash.  Neurological: Negative for seizures and syncope.  All other systems reviewed and are negative.    Physical Exam Triage Vital Signs ED Triage Vitals  Enc Vitals Group     BP      Pulse      Resp      Temp      Temp src      SpO2      Weight      Height      Head Circumference      Peak Flow      Pain Score      Pain Loc      Pain Edu?      Excl. in [place]?    No data found.  Updated Vital Signs BP 132/70 (BP Location: Right Arm)   Pulse 76   Temp 98.2 F (36.8 C) (Oral)   Resp 18   SpO2 97%   Visual Acuity Right Eye Distance:   Left Eye Distance:   Bilateral Distance:    Right Eye Near:   Left Eye Near:    Bilateral Near:     Physical Exam Vitals signs and nursing note reviewed.  Constitutional:      Appearance: He is well-developed.  HENT:     Head: Normocephalic and atraumatic.     Right Ear: Tympanic membrane normal.     Left Ear: Tympanic membrane normal.     Nose: Nose normal.     Mouth/Throat:     Mouth: Mucous membranes are moist.     Pharynx: Oropharynx is clear.  Eyes:     Conjunctiva/sclera: Conjunctivae normal.  Neck:     Musculoskeletal: Neck supple.  Cardiovascular:     Rate and Rhythm: Normal rate and regular rhythm.     Heart sounds: No murmur.  Pulmonary:     Effort: Pulmonary effort is normal. No respiratory distress.     Breath sounds: Normal breath sounds.  Abdominal:     General: Bowel sounds are normal.     Palpations: Abdomen is soft.      Tenderness: There is no abdominal tenderness. There is no guarding or rebound.  Skin:    General: Skin is warm and dry.  Findings: No rash.  Neurological:     Mental Status: He is alert.      UC Treatments / Results  Labs (all labs ordered are listed, but only abnormal results are displayed) Labs Reviewed  NOVEL CORONAVIRUS, NAA (HOSP ORDER, SEND-OUT TO REF LAB; TAT 18-24 HRS)    EKG   Radiology No results found.  Procedures Procedures (including critical care time)  Medications Ordered in UC Medications - No data to display  Initial Impression / Assessment and Plan / UC Course  I have reviewed the triage vital signs and the nursing notes.  Pertinent labs & imaging results that were available during my care of the patient were reviewed by me and considered in my medical decision making (see chart for details).    Fatigue.  Patient request for COVID test.  COVID test performed here.  Instructed patient to self quarantine until his test results are back.  Instructed patient to go to the emergency department if he develops high fever, shortness of breath, severe diarrhea, or other concerning symptoms.  Patient agrees with plan of care.     Final Clinical Impressions(s) / UC Diagnoses   Final diagnoses:  Fatigue, unspecified type  Patient request for diagnostic testing     Discharge Instructions     Your COVID test is pending.  You should self quarantine until your test result is back and is negative.    Go to the emergency department if you develop high fever, shortness of breath, severe diarrhea, or other concerning symptoms.       ED Prescriptions    None     PDMP not reviewed this encounter.   Sharion Balloon, NP 02/28/19 1726

## 2019-02-28 NOTE — ED Triage Notes (Signed)
Pt here for fatigue x several weeks with some chills at times; pt denies taking his temperature

## 2019-03-02 LAB — NOVEL CORONAVIRUS, NAA (HOSP ORDER, SEND-OUT TO REF LAB; TAT 18-24 HRS): SARS-CoV-2, NAA: NOT DETECTED

## 2019-04-23 ENCOUNTER — Emergency Department (HOSPITAL_COMMUNITY)
Admission: EM | Admit: 2019-04-23 | Discharge: 2019-04-24 | Disposition: A | Discharge status: Home / Self Care | Payer: Medicare HMO | Attending: Emergency Medicine | Admitting: Emergency Medicine

## 2019-04-23 ENCOUNTER — Other Ambulatory Visit: Payer: Self-pay

## 2019-04-23 ENCOUNTER — Encounter (HOSPITAL_COMMUNITY): Payer: Self-pay | Admitting: Emergency Medicine

## 2019-04-23 DIAGNOSIS — R519 Headache, unspecified: Secondary | ICD-10-CM | POA: Diagnosis not present

## 2019-04-23 DIAGNOSIS — Z79899 Other long term (current) drug therapy: Secondary | ICD-10-CM | POA: Insufficient documentation

## 2019-04-23 DIAGNOSIS — F17210 Nicotine dependence, cigarettes, uncomplicated: Secondary | ICD-10-CM | POA: Insufficient documentation

## 2019-04-23 DIAGNOSIS — Z634 Disappearance and death of family member: Secondary | ICD-10-CM | POA: Diagnosis not present

## 2019-04-23 LAB — COMPREHENSIVE METABOLIC PANEL
ALT: 29 U/L (ref 0–44)
AST: 34 U/L (ref 15–41)
Albumin: 4.2 g/dL (ref 3.5–5.0)
Alkaline Phosphatase: 63 U/L (ref 38–126)
Anion gap: 9 (ref 5–15)
BUN: 9 mg/dL (ref 6–20)
CO2: 24 mmol/L (ref 22–32)
Calcium: 9.4 mg/dL (ref 8.9–10.3)
Chloride: 105 mmol/L (ref 98–111)
Creatinine, Ser: 1.07 mg/dL (ref 0.61–1.24)
GFR calc Af Amer: >60 mL/min (ref >60)
GFR calc non Af Amer: >60 mL/min (ref >60)
Glucose, Bld: 101 mg/dL — ABNORMAL HIGH (ref 70–99)
Potassium: 4.1 mmol/L (ref 3.5–5.1)
Sodium: 138 mmol/L (ref 135–145)
Total Bilirubin: 0.6 mg/dL (ref 0.3–1.2)
Total Protein: 7.6 g/dL (ref 6.5–8.1)

## 2019-04-23 LAB — CBC WITH DIFFERENTIAL/PLATELET
Abs Immature Granulocytes: 0.02 10*3/uL (ref 0.00–0.07)
Basophils Absolute: 0.1 10*3/uL (ref 0.0–0.1)
Basophils Relative: 2 %
Eosinophils Absolute: 0.2 10*3/uL (ref 0.0–0.5)
Eosinophils Relative: 3 %
HCT: 42.3 % (ref 39.0–52.0)
Hemoglobin: 14.1 g/dL (ref 13.0–17.0)
Immature Granulocytes: 0 %
Lymphocytes Relative: 54 %
Lymphs Abs: 3.7 10*3/uL (ref 0.7–4.0)
MCH: 29.5 pg (ref 26.0–34.0)
MCHC: 33.3 g/dL (ref 30.0–36.0)
MCV: 88.5 fL (ref 80.0–100.0)
Monocytes Absolute: 0.6 10*3/uL (ref 0.1–1.0)
Monocytes Relative: 9 %
Neutro Abs: 2.2 10*3/uL (ref 1.7–7.7)
Neutrophils Relative %: 32 %
Platelets: 165 10*3/uL (ref 150–400)
RBC: 4.78 MIL/uL (ref 4.22–5.81)
RDW: 14.3 % (ref 11.5–15.5)
WBC: 6.9 10*3/uL (ref 4.0–10.5)
nRBC: 0 % (ref 0.0–0.2)

## 2019-04-23 NOTE — ED Triage Notes (Signed)
Patient reports intermittent occipital headache for several weeks , denies head injury , no emesis or photophobia .

## 2019-04-24 MED ORDER — ACETAMINOPHEN 500 MG PO TABS
1000.0000 mg | ORAL_TABLET | Freq: Once | ORAL | Status: AC
  Administered 2019-04-24: 1000 mg via ORAL
  Filled 2019-04-24: qty 2

## 2019-04-24 MED ORDER — METOCLOPRAMIDE HCL 10 MG PO TABS
10.0000 mg | ORAL_TABLET | Freq: Once | ORAL | Status: AC
  Administered 2019-04-24: 01:00:00 10 mg via ORAL
  Filled 2019-04-24: qty 1

## 2019-04-24 NOTE — Discharge Instructions (Addendum)
1. Medications: usual home medications 2. Treatment: rest, drink plenty of fluids,  3. Follow Up: Please followup with your primary doctor in 3 days and neurology within 1 week.  Please return to the ER for new or worsening headache.  Headaches that are different.  Headaches that are associated with vision changes, persistent vomiting, fevers, numbness, weakness, passing out or other concerns.

## 2019-04-24 NOTE — ED Provider Notes (Signed)
[facility] EMERGENCY DEPARTMENT Provider Note   CSN: [id number] Arrival date & time: 04/23/19  2122     History   Chief Complaint Chief Complaint  Patient presents with  . Headache    HPI Scott Farrell is a 38 y.o. male presents to the Emergency Department complaining of intermittent headaches for more than 1 year.  Patient reports they were usually frontal and occipital.  He states they come on gradually.  No specific trigger.  Patient reports they often resolve with Tylenol.  Patient reports he usually has 2 to 3 per week.  He has never been evaluated for these.  He denies associated fever, chills, neck pain, neck stiffness, vision changes, chest pain, shortness of breath, abdominal pain, weakness, dizziness, syncope.  Patient reports that sometimes he has associated nausea and photophobia.  He reports he rarely vomits.  Patient reports his headache today began around noon.  He states he has had increased stress since his grandmother's death and drink 2 beers.  He reports several hours after drinking the beer his headache started.  He reports that is not common for him to drink.  Today he took a BC powder which did not help.  Patient reports his headache is no different today he however became worried and decided to present to the emergency department.  He denies thunderclap headache or syncope.  Nothing seems to make the headache better or worse today.     The history is provided by the patient and medical records. No language interpreter was used.    Past Medical History:  Diagnosis Date  . Clotting disorder ([place])    personal hx/o? etiology unclear  . Costochondritis    history of  . 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 History of echocardiogram 01/07/11   normal LV function, EF 60%, trace mitral, tricuspid and pulmonic regurgitation; Dr. Tollie Eth  . IBS (irritable bowel syndrome)   . Mental disorder 2013   hospitalized for psychiatric illness prior  . Normal  cardiac stress test 01/07/11   negative treadmill test, no evidence of ischemia, good exercise capacity; Tollie Eth, MD;   . Prostatitis 11/2010   Dr. Karsten Ro  . Renal stone 06/2012   Urology consult  . Schizophrenia [facility])    [facility], [place] - Dr. Lennon Alstrom  . Tobacco use disorder     Patient Active Problem List   Diagnosis Date Noted  . Callus of foot 01/27/2018  . Unspecified constipation 04/27/2012  . Family history of malignant neoplasm of gastrointestinal tract 04/27/2012  . 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 EUS  06/30/2012   Procedure: UPPER ENDOSCOPIC ULTRASOUND (EUS) LINEAR;  Surgeon: Milus Banister, MD;  Location: [facility] ENDOSCOPY;  Service: Endoscopy;  Laterality: N/A;  radial linear  . WISDOM TOOTH EXTRACTION          Home Medications    Prior to Admission medications   Medication Sig Start Date End Date Taking? Authorizing Provider  carbamide peroxide (DEBROX) 6.5 % OTIC solution Place 5 drops into both ears 2 (two) times daily. 06/13/17   Jacqlyn Larsen, PA-C  diclofenac (VOLTAREN) 75 MG EC tablet Take 1 tablet (75 mg total) by mouth 2 (two) times daily. Take with food 05/26/17   Vanessa Kick, MD  divalproex (DEPAKOTE ER) 250 MG 24 hr tablet Take 250 mg by mouth daily.     [provider]  HYDROcodone-acetaminophen (NORCO/VICODIN) 5-325 MG tablet Take 1  tablet by mouth every 6 (six) hours as needed for moderate pain. 11/23/16   Hedges, Dellis Filbert, PA-C  Paliperidone Palmitate (INVEGA SUSTENNA IM) Inject 1 each into the muscle every 30 (thirty) days. Next injection due 1st week of February 2017    [provider]  PRESCRIPTION MEDICATION Take 2 tablets by mouth at bedtime. depakote    [provider]  tiZANidine (ZANAFLEX) 4 MG tablet Take 1 tablet (4 mg total) by mouth every 8 (eight) hours as needed for muscle spasms. 05/19/16   Melynda Ripple, MD  traMADol (ULTRAM) 50 MG tablet  1-2 tabs po q 6 hr prn pain Maximum dose= 8 tablets per day Patient not taking: Reported on 02/28/2019 05/19/16   Melynda Ripple, MD  valACYclovir (VALTREX) 1000 MG tablet Take 1 tablet (1,000 mg total) by mouth 2 (two) times daily. For 7 days for outbreaks 12/12/15   Noe Gens, PA-C    Family History Family History  Problem Relation Age of Onset  . Hypertension Mother   . Stroke Mother   . Depression Mother   . Acne Mother   . Thyroid disease Mother   . Diabetes Mother   . Colon cancer Mother 5  . Acute lymphoblastic leukemia Father   . Diabetes Father   . Cancer Brother   . Breast cancer Maternal Aunt   . Ovarian cancer Maternal Aunt     Social History Social History   Tobacco Use  . Smoking status: Current Every Day Smoker    Packs/day: 0.25    Types: Cigarettes, Cigars  . Smokeless tobacco: Never Used  Substance Use Topics  . Alcohol use: Yes    Comment: occ  . Drug use: No     Allergies   Patient has no known allergies.   Review of Systems Review of Systems  Constitutional: Negative for appetite change, diaphoresis, fatigue, fever and unexpected weight change.  HENT: Negative for mouth sores.   Eyes: Negative for visual disturbance.  Respiratory: Negative for cough, chest tightness, shortness of breath and wheezing.   Cardiovascular: Negative for chest pain.  Gastrointestinal: Positive for nausea and vomiting (intermittent, not today). Negative for abdominal pain, constipation and diarrhea.  Endocrine: Negative for polydipsia, polyphagia and polyuria.  Genitourinary: Negative for dysuria, frequency, hematuria and urgency.  Musculoskeletal: Negative for back pain and neck stiffness.  Skin: Negative for rash.  Allergic/Immunologic: Negative for immunocompromised state.  Neurological: Positive for headaches. Negative for syncope and light-headedness.  Hematological: Does not bruise/bleed easily.  Psychiatric/Behavioral: Negative for sleep disturbance.  The patient is not nervous/anxious.      Physical Exam Updated Vital Signs BP 136/86 (BP Location: Left Arm)   Pulse 72   Temp 98.3 F (36.8 C) (Oral)   Resp 16   Ht 6\' 3"  (1.905 m)   Wt 108.9 kg   SpO2 98%   BMI 30.00 kg/m   Physical Exam Vitals signs and nursing note reviewed.  Constitutional:      General: He is not in acute distress.    Appearance: He is well-developed. He is not diaphoretic.  HENT:     Head: Normocephalic and atraumatic.     Right Ear: Tympanic membrane, ear canal and external ear normal.     Left Ear: Tympanic membrane, ear canal and external ear normal.     Nose: Nose normal. No rhinorrhea.     Mouth/Throat:     Pharynx: Oropharynx is clear. Uvula midline. No pharyngeal swelling, oropharyngeal exudate, posterior oropharyngeal erythema or uvula  swelling.  Eyes:     General: No scleral icterus.    Conjunctiva/sclera: Conjunctivae normal.     Pupils: Pupils are equal, round, and reactive to light.     Comments: No horizontal, vertical or rotational nystagmus  Neck:     Musculoskeletal: Normal range of motion and neck supple.     Comments: Full active and passive ROM without pain No midline or paraspinal tenderness No nuchal rigidity or meningeal signs Cardiovascular:     Rate and Rhythm: Normal rate and regular rhythm.  Pulmonary:     Effort: Pulmonary effort is normal. No respiratory distress.  Abdominal:     General: Bowel sounds are normal.     Palpations: Abdomen is soft.     Tenderness: There is no abdominal tenderness. There is no guarding or rebound.  Musculoskeletal: Normal range of motion.  Lymphadenopathy:     Cervical: No cervical adenopathy.  Skin:    General: Skin is warm and dry.     Findings: No rash.  Neurological:     Mental Status: He is alert and oriented to person, place, and time.     Cranial Nerves: No cranial nerve deficit.     Motor: No abnormal muscle tone.     Coordination: Coordination normal.     Comments:  Mental Status:  Alert, oriented, thought content appropriate. Speech fluent without evidence of aphasia. Able to follow 2 step commands without difficulty.  Cranial Nerves:  II:  Peripheral visual fields grossly normal, pupils equal, round, reactive to light III,IV, VI: ptosis not present, extra-ocular motions intact bilaterally  V,VII: smile symmetric, facial light touch sensation equal VIII: hearing grossly normal bilaterally  IX,X: midline uvula rise  XI: bilateral shoulder shrug equal and strong XII: midline tongue extension  Motor:  5/5 in upper and lower extremities bilaterally including strong and equal grip strength and dorsiflexion/plantar flexion Sensory: Pinprick and light touch normal in all extremities.  Cerebellar: normal finger-to-nose with bilateral upper extremities Gait: normal gait and balance CV: distal pulses palpable throughout   Psychiatric:        Behavior: Behavior normal.        Thought Content: Thought content normal.        Judgment: Judgment normal.      ED Treatments / Results  Labs (all labs ordered are listed, but only abnormal results are displayed) Labs Reviewed  COMPREHENSIVE METABOLIC PANEL - Abnormal; Notable for the following components:      Result Value   Glucose, Bld 101 (*)    All other components within normal limits  CBC WITH DIFFERENTIAL/PLATELET    Procedures Procedures (including critical care time)  Medications Ordered in ED Medications  acetaminophen (TYLENOL) tablet 1,000 mg (1,000 mg Oral Given 04/24/19 0054)  metoCLOPramide (REGLAN) tablet 10 mg (10 mg Oral Given 04/24/19 0054)     Initial Impression / Assessment and Plan / ED Course  I have reviewed the triage vital signs and the nursing notes.  Pertinent labs & imaging results that were available during my care of the patient were reviewed by me and considered in my medical decision making (see chart for details).        Patient presents with greater than 1  year of recurrent headaches.  No red flags.  Suspect primary headache.  He has never been diagnosed with migraines.  Normal neurologic exam today.  Highly doubt subarachnoid, meningitis or intracranial mass.  No clinical signs or symptoms of sinusitis.  Will give medication here in  the emergency department.  Recommended no alcohol usage until further evaluation.  Discussed the need for outpatient follow-up with neurology and further evaluation.  Also discussed reasons to return immediately to the emergency department.  Patient states understanding and is in agreement with this plan.  Final Clinical Impressions(s) / ED Diagnoses   Final diagnoses:  Acute nonintractable headache, unspecified headache type    ED Discharge Orders    None       Agapito Games 04/24/19 0058    Ward, Delice Bison, DO 04/24/19 [id number]

## 2019-05-24 DIAGNOSIS — F251 Schizoaffective disorder, depressive type: Secondary | ICD-10-CM | POA: Diagnosis not present

## 2019-06-09 ENCOUNTER — Ambulatory Visit: Payer: Medicare HMO | Admitting: Neurology

## 2019-06-24 ENCOUNTER — Other Ambulatory Visit: Payer: Self-pay

## 2019-06-24 ENCOUNTER — Encounter (HOSPITAL_COMMUNITY): Payer: Self-pay

## 2019-06-24 ENCOUNTER — Ambulatory Visit (HOSPITAL_COMMUNITY)
Admission: EM | Admit: 2019-06-24 | Discharge: 2019-06-24 | Disposition: A | Discharge status: Home / Self Care | Payer: Medicare HMO | Attending: Physician Assistant | Admitting: Physician Assistant

## 2019-06-24 DIAGNOSIS — J020 Streptococcal pharyngitis: Secondary | ICD-10-CM | POA: Insufficient documentation

## 2019-06-24 DIAGNOSIS — R058 Other specified cough: Secondary | ICD-10-CM

## 2019-06-24 DIAGNOSIS — R05 Cough: Secondary | ICD-10-CM | POA: Insufficient documentation

## 2019-06-24 DIAGNOSIS — Z20822 Contact with and (suspected) exposure to covid-19: Secondary | ICD-10-CM | POA: Diagnosis not present

## 2019-06-24 LAB — POCT RAPID STREP A: Streptococcus, Group A Screen (Direct): POSITIVE — AB

## 2019-06-24 MED ORDER — PENICILLIN V POTASSIUM 500 MG PO TABS: 500.0000 mg | ORAL_TABLET | Freq: Two times a day (BID) | ORAL | 0 refills | Status: AC

## 2019-06-24 NOTE — Discharge Instructions (Addendum)
Wear masks Continue to social distance and self isolate Drink plenty of water Get plenty of rest. Discard tooth brush after 4 days

## 2019-06-24 NOTE — ED Triage Notes (Signed)
Pt c/o fever n/d, HA, sore throat since last Sunday, but sx improved Wednesday.  C/o sore throat, congestion,  HA (appt to see neurologist for chronic HA) is only complaint at present. Denies abd pain or SOB.   Pt spontaneously began laughing w/o known reason. States he "use to take more medicine".

## 2019-06-24 NOTE — ED Provider Notes (Signed)
[place]    CSN: [id number] Arrival date & time: 06/24/19  1337      History   Chief Complaint Chief Complaint  Patient presents with  . Sore Throat  . covid test    HPI Scott Farrell is a 39 y.o. male.   Patient here concerned with sore throat x 6 days.  Patient admits f/c (tmax 100.4), nasal congestion, rhinorrhea, cough, n/v/d, all sx have resolved except for sore throat and HA.  No known exposures to COVID, no sick contacts.  No advli or tylenol today.     Past Medical History:  Diagnosis Date  . Clotting disorder ([place])    personal hx/o? etiology unclear  . Costochondritis    history of  . 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 History of echocardiogram 01/07/11   normal LV function, EF 60%, trace mitral, tricuspid and pulmonic regurgitation; Dr. Tollie Eth  . IBS (irritable bowel syndrome)   . Mental disorder 2013   hospitalized for psychiatric illness prior  . Normal cardiac stress test 01/07/11   negative treadmill test, no evidence of ischemia, good exercise capacity; Tollie Eth, MD;   . Prostatitis 11/2010   Dr. Karsten Ro  . Renal stone 06/2012   Urology consult  . Schizophrenia [facility])    [facility], [place] - Dr. Lennon Alstrom  . Tobacco use disorder     Patient Active Problem List   Diagnosis Date Noted  . Callus of foot 01/27/2018  . Unspecified constipation 04/27/2012  . Family history of malignant neoplasm of gastrointestinal tract 04/27/2012  . 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 EUS  06/30/2012   Procedure: UPPER ENDOSCOPIC ULTRASOUND (EUS) LINEAR;  Surgeon: Milus Banister, MD;  Location: [facility];  Service: Endoscopy;  Laterality: N/A;  radial linear  . WISDOM TOOTH EXTRACTION         Home Medications    Prior to Admission medications   Medication Sig Start Date End Date Taking? Authorizing Provider  divalproex (DEPAKOTE ER) 250 MG 24 hr tablet Take 250 mg by mouth daily.     Yes [provider]  risperiDONE (RISPERDAL) 2 MG tablet Take 2 mg by mouth 2 (two) times daily.   Yes [provider]  carbamide peroxide (DEBROX) 6.5 % OTIC solution Place 5 drops into both ears 2 (two) times daily. 06/13/17   Jacqlyn Larsen, PA-C  diclofenac (VOLTAREN) 75 MG EC tablet Take 1 tablet (75 mg total) by mouth 2 (two) times daily. Take with food 05/26/17   Vanessa Kick, MD  HYDROcodone-acetaminophen (NORCO/VICODIN) 5-325 MG tablet Take 1 tablet by mouth every 6 (six) hours as needed for moderate pain. 11/23/16   Hedges, Dellis Filbert, PA-C  Paliperidone Palmitate (INVEGA SUSTENNA IM) Inject 1 each into the muscle every 30 (thirty) days. Next injection due 1st week of February 2017    [provider]  penicillin v potassium (VEETID) 500 MG tablet Take 1 tablet (500 mg total) by mouth 2 (two) times daily for 10 days. 06/24/19 07/04/19  Peri Jefferson, PA-C  PRESCRIPTION MEDICATION Take 2 tablets by mouth at bedtime. depakote    [provider]  tiZANidine (ZANAFLEX) 4 MG tablet Take 1 tablet (4 mg total) by mouth every 8 (eight) hours as needed for muscle spasms. 05/19/16   Melynda Ripple, MD  traMADol (ULTRAM) 50 MG tablet 1-2 tabs po q 6 hr prn pain Maximum dose= 8 tablets per day Patient not taking: Reported  on 02/28/2019 05/19/16   Melynda Ripple, MD  valACYclovir (VALTREX) 1000 MG tablet Take 1 tablet (1,000 mg total) by mouth 2 (two) times daily. For 7 days for outbreaks 12/12/15   Noe Gens, PA-C    Family History Family History  Problem Relation Age of Onset  . Hypertension Mother   . Stroke Mother   . Depression Mother   . Acne Mother   . Thyroid disease Mother   . Diabetes Mother   . Colon cancer Mother 75  . Acute lymphoblastic leukemia Father   . Diabetes Father   . Cancer Brother   . Breast cancer Maternal Aunt   . Ovarian cancer Maternal Aunt     Social History Social History   Tobacco Use  . Smoking status: Current  Every Day Smoker    Packs/day: 0.25    Types: Cigarettes, Cigars  . Smokeless tobacco: Never Used  Substance Use Topics  . Alcohol use: Yes    Comment: occ  . Drug use: No     Allergies   Patient has no known allergies.   Review of Systems Review of Systems  Constitutional: Negative for appetite change, chills, fatigue and fever.  HENT: Positive for congestion, rhinorrhea and sore throat. Negative for ear discharge, ear pain and sinus pressure.   Respiratory: Negative for cough, shortness of breath and wheezing.   Gastrointestinal: Negative for diarrhea, nausea and vomiting.  Musculoskeletal: Negative for back pain.  Skin: Negative for color change and rash.  Allergic/Immunologic: Negative for immunocompromised state.  Hematological: Negative for adenopathy.     Physical Exam Triage Vital Signs ED Triage Vitals  Enc Vitals Group     BP 06/24/19 1622 121/74     Pulse Rate 06/24/19 1622 78     Resp 06/24/19 1622 16     Temp 06/24/19 1622 98.6 F (37 C)     Temp Source 06/24/19 1622 Oral     SpO2 06/24/19 1622 98 %     Weight --      Height --      Head Circumference --      Peak Flow --      Pain Score 06/24/19 1618 5     Pain Loc --      Pain Edu? --      Excl. in [place]? --    No data found.  Updated Vital Signs BP 121/74 (BP Location: Left Arm)   Pulse 78   Temp 98.6 F (37 C) (Oral)   Resp 16   SpO2 98%   Visual Acuity Right Eye Distance:   Left Eye Distance:   Bilateral Distance:    Right Eye Near:   Left Eye Near:    Bilateral Near:     Physical Exam Vitals and nursing note reviewed.  Constitutional:      Appearance: He is well-developed.  HENT:     Head: Normocephalic and atraumatic.     Right Ear: Tympanic membrane and ear canal normal.     Left Ear: Tympanic membrane and ear canal normal.     Nose: Rhinorrhea present. No congestion.     Mouth/Throat:     Mouth: Mucous membranes are moist.     Pharynx: Uvula midline. Posterior  oropharyngeal erythema present. No uvula swelling.  Eyes:     Conjunctiva/sclera: Conjunctivae normal.  Cardiovascular:     Rate and Rhythm: Normal rate and regular rhythm.     Heart sounds: No murmur.  Pulmonary:  Effort: Pulmonary effort is normal. No respiratory distress.     Breath sounds: Normal breath sounds.  Musculoskeletal:     Cervical back: Neck supple.  Skin:    General: Skin is warm and dry.     Capillary Refill: Capillary refill takes less than 2 seconds.  Neurological:     General: No focal deficit present.     Mental Status: He is alert.      UC Treatments / Results  Labs (all labs ordered are listed, but only abnormal results are displayed) Labs Reviewed  POCT RAPID STREP A - Abnormal; Notable for the following components:      Result Value   Streptococcus, Group A Screen (Direct) POSITIVE (*)    All other components within normal limits  NOVEL CORONAVIRUS, NAA (HOSP ORDER, SEND-OUT TO REF LAB; TAT 18-24 HRS)    EKG   Radiology No results found.  Procedures Procedures (including critical care time)  Medications Ordered in UC Medications - No data to display  Initial Impression / Assessment and Plan / UC Course  I have reviewed the triage vital signs and the nursing notes.  Pertinent labs & imaging results that were available during my care of the patient were reviewed by me and considered in my medical decision making (see chart for details).      Final Clinical Impressions(s) / UC Diagnoses   Final diagnoses:  Streptococcal sore throat  Cough with exposure to COVID-19 virus     Discharge Instructions     Wear masks Continue to social distance and self isolate Drink plenty of water Get plenty of rest. Discard tooth brush after 4 days    ED Prescriptions    Medication Sig Dispense Auth. Provider   penicillin v potassium (VEETID) 500 MG tablet Take 1 tablet (500 mg total) by mouth 2 (two) times daily for 10 days. 20 tablet  Peri Jefferson, PA-C     PDMP not reviewed this encounter.   Peri Jefferson, PA-C 06/24/19 1711

## 2019-06-25 LAB — NOVEL CORONAVIRUS, NAA (HOSP ORDER, SEND-OUT TO REF LAB; TAT 18-24 HRS): SARS-CoV-2, NAA: NOT DETECTED

## 2019-07-19 DIAGNOSIS — F251 Schizoaffective disorder, depressive type: Secondary | ICD-10-CM | POA: Diagnosis not present

## 2019-07-31 ENCOUNTER — Encounter: Payer: Self-pay | Admitting: Neurology

## 2019-07-31 ENCOUNTER — Other Ambulatory Visit: Payer: Self-pay

## 2019-07-31 ENCOUNTER — Ambulatory Visit: Payer: Medicare HMO | Admitting: Neurology

## 2019-07-31 VITALS — BP 114/61 | HR 66 | Temp 97.5°F | Ht 75.0 in | Wt 251.5 lb

## 2019-07-31 DIAGNOSIS — D1800 Hemangioma unspecified site: Secondary | ICD-10-CM | POA: Insufficient documentation

## 2019-07-31 DIAGNOSIS — R42 Dizziness and giddiness: Secondary | ICD-10-CM | POA: Insufficient documentation

## 2019-07-31 DIAGNOSIS — G4489 Other headache syndrome: Secondary | ICD-10-CM

## 2019-07-31 NOTE — Progress Notes (Signed)
[facility]  PATIENT: Scott Farrell DOB: [birth year]  REFERRING DOCTOR OR PCP:  Glendale Chard SOURCE: Notes from   _________________________________   HISTORICAL  CHIEF COMPLAINT:  Chief Complaint  Patient presents with  . New Patient (Initial Visit)    RM 12, alone. ED referral for headaches. Started around age 39 or 39. Has never seen neurologist. He is getting about 1-2 severe headaches per month. Has tried tylenol, BC powder, excedrin. Has vision changes during episodes. Denies any nausea.     HISTORY OF PRESENT ILLNESS:  I had the pleasure of seeing your patient, Scott Farrell, at [facility] for neurologic consultation regarding his headaches.  He is a 39 year old man who reports severe headaches twice a month.   Pain is usually in the temples, occiput and top of head.   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   Moving makes the headache worse.  When one occurs, acetaminophen usually helps.  Pain is pressure like and severe.   Headache lasts 30-60 minutes. Vision is blurry during many episodes.   He does not note any triggers.   He will also have milder headaches once or twice a week that are not associated with other symptoms.  Headaches have generally worsened over the last few years.   The last headache was more severe and he went to the ED.   He has had headaches since he was young (age 39 or so).    Quality of headache is similar.   He used to have more lightheadedness when a headache occurred.    He has been on Depakote 250 mg and Risperdal 2 mg po bid for schizoaffective disorder.   He feels the psychiatric medications have helped.    His mother also has headaches but he is unsure how similar they are.     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   Images were personally reviewed.   REVIEW OF SYSTEMS: Constitutional: No  fevers, chills, sweats, or change in appetite Eyes: No visual changes, double vision, eye pain Ear, nose and throat: No hearing loss, ear pain, nasal congestion, sore throat Cardiovascular: No chest pain, palpitations Respiratory: No shortness of breath at rest or with exertion.   No wheezes GastrointestinaI: No nausea, vomiting, diarrhea, abdominal pain, fecal incontinence Genitourinary: No dysuria, urinary retention or frequency.  No nocturia. Musculoskeletal: No neck pain, back pain Integumentary: No rash, pruritus, skin lesions Neurological: as above Psychiatric: No depression at this time.  No anxiety Endocrine: No palpitations, diaphoresis, change in appetite, change in weigh or increased thirst Hematologic/Lymphatic: No anemia, purpura, petechiae. Allergic/Immunologic: No itchy/runny eyes, nasal congestion, recent allergic reactions, rashes  ALLERGIES: No Known Allergies  HOME MEDICATIONS:  Current Outpatient Medications:  .  divalproex (DEPAKOTE ER) 250 MG 24 hr tablet, Take 250 mg by mouth daily. , Disp: , Rfl:  .  risperiDONE (RISPERDAL) 2 MG tablet, Take 2 mg by mouth 2 (two) times daily., Disp: , Rfl:   PAST MEDICAL HISTORY: Past Medical History:  Diagnosis Date  . Clotting disorder ([place])    personal hx/o? etiology unclear  . Costochondritis    history of  . 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 History of echocardiogram 01/07/11   normal LV function, EF 60%, trace mitral, tricuspid and pulmonic regurgitation; Dr. Tollie Eth  . IBS (irritable bowel syndrome)   . Mental disorder 2013   hospitalized for  psychiatric illness prior  . Normal cardiac stress test 01/07/11   negative treadmill test, no evidence of ischemia, good exercise capacity; Tollie Eth, MD;   . Prostatitis 11/2010   Dr. Karsten Ro  . Renal stone 06/2012   Urology consult  . Schizophrenia [facility])    [facility], [place] - Dr. Lennon Alstrom  . Tobacco use disorder     PAST SURGICAL HISTORY: Past  Surgical History:  Procedure Laterality Date  . EUS  06/30/2012   Procedure: UPPER ENDOSCOPIC ULTRASOUND (EUS) LINEAR;  Surgeon: Milus Banister, MD;  Location: [facility];  Service: Endoscopy;  Laterality: N/A;  radial linear  . WISDOM TOOTH EXTRACTION      FAMILY HISTORY: Family History  Problem Relation Age of Onset  . Hypertension Mother   . Stroke Mother   . Depression Mother   . Acne Mother   . Thyroid disease Mother   . Diabetes Mother   . Colon cancer Mother 56  . Acute lymphoblastic leukemia Father   . Diabetes Father   . Cancer Brother   . Breast cancer Maternal Aunt   . Ovarian cancer Maternal Aunt     SOCIAL HISTORY:  Social History   Socioeconomic History  . Marital status: Single    Spouse name: Not on file  . Number of children: Not on file  . Years of education: Not on file  . Highest education level: Not on file  Occupational History  . Occupation: [employer]  Tobacco Use  . Smoking status: Current Every Day Smoker    Packs/day: 1.00    Types: Cigarettes, Cigars  . Smokeless tobacco: Never Used  Substance and Sexual Activity  . Alcohol use: Yes    Comment: occ  . Drug use: Not Currently  . Sexual activity: Yes  Other Topics Concern  . Not on file  Social History Narrative   Lives with mother, [name], exercising with walking, running, no current relationship; unemployed   Caffeine use: 1 cup per day   Right handed   Social Determinants of Health   Financial Resource Strain:   . Difficulty of Paying Living Expenses: Not on file  Food Insecurity:   . Worried About Charity fundraiser in the Last Year: Not on file  . Ran Out of Food in the Last Year: Not on file  Transportation Needs:   . Lack of Transportation (Medical): Not on file  . Lack of Transportation (Non-Medical): Not on file  Physical Activity:   . Days of Exercise per Week: Not on file  . Minutes of Exercise per Session: Not on file  Stress:   . Feeling of Stress : Not on file    Social Connections:   . Frequency of Communication with Friends and Family: Not on file  . Frequency of Social Gatherings with Friends and Family: Not on file  . Attends Religious Services: Not on file  . Active Member of Clubs or Organizations: Not on file  . Attends Archivist Meetings: Not on file  . Marital Status: Not on file  Intimate Partner Violence:   . Fear of Current or Ex-Partner: Not on file  . Emotionally Abused: Not on file  . Physically Abused: Not on file  . Sexually Abused: Not on file     PHYSICAL EXAM  Vitals:   07/31/19 1019  BP: 114/61  Pulse: 66  Temp: (!) 97.5 F (36.4 C)  Weight: 251 lb 8 oz (114.1 kg)  Height: 6\' 3"  (1.905 m)  Body mass index is 31.44 kg/m.   General: The patient is well-developed and well-nourished and in no acute distress  HEENT:  Head is [place]/AT.  Sclera are anicteric.  Funduscopic exam shows normal optic discs and retinal vessels.  Neck: No carotid bruits are noted.  The neck is nontender.  Cardiovascular: The heart has a regular rate and rhythm with a normal S1 and S2. There were no murmurs, gallops or rubs.    Skin: Extremities are without rash or  edema.  Neurologic Exam  Mental status: The patient is alert and oriented x 3 at the time of the examination. The patient has apparent normal recent and remote memory, with an apparently normal attention span and concentration ability.   Speech is normal.  Cranial nerves: Extraocular movements are full. Pupils are equal, round, and reactive to light and accomodation.  Visual fields are full.  Facial symmetry is present. There is good facial sensation to soft touch bilaterally.Facial strength is normal.  Trapezius and sternocleidomastoid strength is normal. No dysarthria is noted.  No obvious hearing deficits are noted.  Motor:  Muscle bulk is normal.   Tone is normal. Strength is  5 / 5 in all 4 extremities.   Sensory: Sensory testing is intact to pinprick, soft  touch and vibration sensation in all 4 extremities.  Coordination: Cerebellar testing reveals good finger-nose-finger and heel-to-shin bilaterally.  Gait and station: Station is normal.   Gait is normal. Tandem gait is normal. Romberg is negative.   Reflexes: Deep tendon reflexes are symmetric and normal bilaterally.        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  He is easily able to have the headaches resolve with acetaminophen.  They have increased in severity and frequency lately.  MRI in 2012 did show a focus in the left parietal lobe that most likely represents a cavernous angioma.  However, due to the worsening headaches and his known abnormal MRI we need to reevaluate this.  I will check an MRI of the brain with and without contrast.  At the current frequency, he will continue to treat the headaches as they occur.  However, if they become more frequent we will add a medication such as a tricyclic.  He will return to see [place] in 4 months or sooner if there are new or worsening neurologic symptoms.  Thank you for asking me to see Scott Farrell.  Please let me know if I can be of further assistance with him or other patients in the future.   Saki Legore A. Felecia Shelling, MD, [facility] [id number], [id number] Certified in Neurology, Clinical Neurophysiology, Sleep Medicine and Neuroimaging  [facility] [address], [place], [place] [address] [phone]

## 2019-08-03 ENCOUNTER — Telehealth: Payer: Self-pay | Admitting: Neurology

## 2019-08-03 NOTE — Telephone Encounter (Signed)
[employer] pending faxed notes.  

## 2019-08-04 ENCOUNTER — Telehealth: Payer: Self-pay | Admitting: Neurology

## 2019-08-04 NOTE — Telephone Encounter (Signed)
Scott Farrell @ [facility] has called to report they are unable to process the request for MRI order due to missing information on form submitted to them.  Scott Farrell states they can be called back at [phone] or faxed at [phone].  Scott Farrell stated she will also fax over this information

## 2019-08-07 NOTE — Telephone Encounter (Signed)
I spoke to a health help nurse she is unable to approve it on her level she is forwarding it to the MD review.

## 2019-08-07 NOTE — Telephone Encounter (Signed)
I had faxed notes on 08/03/19 and again today.

## 2019-08-08 NOTE — Telephone Encounter (Signed)
Mcarthur Rossetti Josem Kaufmann: [id number] (exp. 08/03/19 to 09/02/19) order sent to GI. They will reach out to the patient to schedule.

## 2019-08-24 DIAGNOSIS — F251 Schizoaffective disorder, depressive type: Secondary | ICD-10-CM | POA: Diagnosis not present

## 2019-09-01 ENCOUNTER — Other Ambulatory Visit: Payer: Self-pay

## 2019-09-01 ENCOUNTER — Ambulatory Visit
Admission: RE | Admit: 2019-09-01 | Discharge: 2019-09-01 | Disposition: A | Discharge status: Home / Self Care | Payer: Medicare HMO | Source: Ambulatory Visit | Attending: Neurology | Admitting: Neurology

## 2019-09-01 DIAGNOSIS — D1800 Hemangioma unspecified site: Secondary | ICD-10-CM

## 2019-09-01 DIAGNOSIS — G4489 Other headache syndrome: Secondary | ICD-10-CM

## 2019-09-01 MED ORDER — GADOBENATE DIMEGLUMINE 529 MG/ML IV SOLN
20.0000 mL | Freq: Once | INTRAVENOUS | Status: AC | PRN
  Administered 2019-09-01: 20 mL via INTRAVENOUS

## 2019-09-04 ENCOUNTER — Telehealth: Payer: Self-pay | Admitting: *Deleted

## 2019-09-04 NOTE — Telephone Encounter (Signed)
-----   Message from Britt Bottom, MD sent at 09/04/2019  1:13 PM EDT ----- Please let him know that the MRI of the brain looked unchanged compared to his previous one.  The vascular abnormality (cavernous angioma) looks the same.  It did look like there was a small fatty growth in the right frontal scalp.  Usually you do not have to do anything for these but if it becomes painful over the lump let us know.

## 2019-09-04 NOTE — Telephone Encounter (Signed)
Called and spoke with pt about MRI results per Dr. Sater note. He verbalized understanding.  

## 2019-09-27 ENCOUNTER — Encounter: Payer: Self-pay | Admitting: Nurse Practitioner

## 2019-09-27 ENCOUNTER — Ambulatory Visit (INDEPENDENT_AMBULATORY_CARE_PROVIDER_SITE_OTHER): Payer: Medicare Other | Admitting: Nurse Practitioner

## 2019-09-27 ENCOUNTER — Other Ambulatory Visit: Payer: Self-pay

## 2019-09-27 VITALS — BP 122/78 | HR 66 | Temp 98.1°F | Ht 76.2 in | Wt 247.6 lb

## 2019-09-27 DIAGNOSIS — M79671 Pain in right foot: Secondary | ICD-10-CM | POA: Diagnosis not present

## 2019-09-27 DIAGNOSIS — R5383 Other fatigue: Secondary | ICD-10-CM

## 2019-09-27 DIAGNOSIS — Z720 Tobacco use: Secondary | ICD-10-CM | POA: Diagnosis not present

## 2019-09-27 DIAGNOSIS — M79672 Pain in left foot: Secondary | ICD-10-CM

## 2019-09-27 DIAGNOSIS — R202 Paresthesia of skin: Secondary | ICD-10-CM | POA: Diagnosis not present

## 2019-09-27 NOTE — Progress Notes (Signed)
This visit occurred during the SARS-CoV-2 public health emergency.  Safety protocols were in place, including screening questions prior to the visit, additional usage of staff PPE, and extensive cleaning of exam room while observing appropriate contact time as indicated for disinfecting solutions.  Subjective:     Patient ID: Scott Farrell , male    DOB: 11/27/1980 , 39 y.o.   MRN: [id number]   Chief Complaint  Patient presents with  . Establish Care    ankle pain    HPI  Here to establish care - he had been going to urgent care for his primary care needs. He is unsure when he had a set of labs done. Currently unemployed since the beginning of April. He was working at [employer] as a Clinical research associate.  Came out of work due to health reasons. Single. 1 child - healthy.  PMH - Schizophrenia (2003) - he graduated from [employer]. Has been hospitalized in the past with the most recent in [place].  He is not currently seeing a psychiatrist, he sees Set designer [employer] [facility] [employer] [facility]) - at least once every 12 weeks.  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    [facility] - Mother and father with history of diabetes.  Brother passed from Sarcoma at the age of 37. Brother - healthy.  Maternal grandfather - diabetes with amputation.   Covid vaccine - had his first injection. He states he almost passed out when he had the injection  He is reporting having discoloration to his feet which has been going on for a while, with a burning.  Feels like his joints are ready to "break off". Reports a family history of arthritis. Does not interfere with walking. Constant change in color to his feet.  Reports numbness and tingling.   His mother has a history of neuropathy.    Fatigue - tries to stay on track with his schedule.  He will go to bed about 11 or 12pm at night. He wakes up in the morning varies anywhere between 4-10am.  He feels he is depressed but has not taken any medications.   Denies suicidal or  homicidal ideations.      Past Medical History:  Diagnosis Date  . Clotting disorder ([place])    personal hx/o? etiology unclear  . Costochondritis    history of  . 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 History of echocardiogram 01/07/11   normal LV function, EF 60%, trace mitral, tricuspid and pulmonic regurgitation; Dr. Tollie Eth  . IBS (irritable bowel syndrome)   . Mental disorder 2013   hospitalized for psychiatric illness prior  . Normal cardiac stress test 01/07/11   negative treadmill test, no evidence of ischemia, good exercise capacity; Tollie Eth, MD;   . Prostatitis 11/2010   Dr. Karsten Ro  . Renal stone 06/2012   Urology consult  . Schizophrenia [facility])    [facility], [place] - Dr. Lennon Alstrom  . Tobacco use disorder      Family History  Problem Relation Age of Onset  . Hypertension Mother   . Stroke Mother   . Depression Mother   . Acne Mother   . Thyroid disease Mother   . Diabetes Mother   . Colon cancer Mother 66  . Acute lymphoblastic leukemia Father   . Diabetes Father   . Cancer Brother   . Breast cancer Maternal Aunt   . Ovarian cancer Maternal Aunt      Current Outpatient Medications:  .  divalproex (DEPAKOTE ER) 250 MG  24 hr tablet, Take 250 mg by mouth daily. , Disp: , Rfl:  .  risperiDONE (RISPERDAL) 2 MG tablet, Take 2 mg by mouth 2 (two) times daily., Disp: , Rfl:    No Known Allergies   Review of Systems  Constitutional: Negative.   Respiratory: Negative.   Cardiovascular: Negative.   Gastrointestinal: Negative.   Skin: Negative.   Neurological: Negative.   Hematological: Negative.   Psychiatric/Behavioral: Negative.      Today's Vitals   09/27/19 1055  BP: 122/78  Pulse: 66  Temp: 98.1 F (36.7 C)  TempSrc: Oral  SpO2: 97%  Weight: 247 lb 9.6 oz (112.3 kg)  Height: 6' 4.2" (1.935 m)  PainSc: 6   PainLoc: Ankle   Body mass index is 29.98 kg/m.   Objective:  Physical Exam Vitals reviewed.  Constitutional:      General:  He is not in acute distress.    Appearance: Normal appearance. He is obese.  HENT:     Head: Normocephalic.  Cardiovascular:     Rate and Rhythm: Normal rate and regular rhythm.     Pulses: Normal pulses.     Heart sounds: Normal heart sounds. No murmur.  Pulmonary:     Effort: Pulmonary effort is normal. No respiratory distress.     Breath sounds: Normal breath sounds. No wheezing.  Skin:    General: Skin is warm and dry.     Capillary Refill: Capillary refill takes less than 2 seconds.  Neurological:     General: No focal deficit present.     Mental Status: He is alert and oriented to person, place, and time.  Psychiatric:        Mood and Affect: Mood normal.        Behavior: Behavior normal.        Thought Content: Thought content normal.        Judgment: Judgment normal.         Assessment And Plan:     1. Tobacco abuse  Smoking cessation instruction/counseling given:  counseled patient on the dangers of tobacco use, advised patient to stop smoking, and reviewed strategies to maximize success    2. Pain in both feet  Having joint pain to both feet will check autoimmune panel   Pending results will consider a referral to podiatry/orthopedics - CMP14+EGFR - CBC - Autoimmune Profile  3. Fatigue, unspecified type  Will check for metabolic causes   Could be related to mood  - Vitamin B12 - TSH   [name], FNP    THE PATIENT IS ENCOURAGED TO PRACTICE SOCIAL DISTANCING DUE TO THE COVID-19 PANDEMIC.

## 2019-09-28 LAB — CMP14+EGFR
ALT: 19 IU/L (ref 0–44)
AST: 26 IU/L (ref 0–40)
Albumin/Globulin Ratio: 2 (ref 1.2–2.2)
Albumin: 4.8 g/dL (ref 4.0–5.0)
Alkaline Phosphatase: 70 IU/L (ref 39–117)
BUN/Creatinine Ratio: 14 (ref 9–20)
BUN: 13 mg/dL (ref 6–20)
Bilirubin Total: 0.3 mg/dL (ref 0.0–1.2)
CO2: 21 mmol/L (ref 20–29)
Calcium: 9.7 mg/dL (ref 8.7–10.2)
Chloride: 103 mmol/L (ref 96–106)
Creatinine, Ser: 0.95 mg/dL (ref 0.76–1.27)
GFR calc Af Amer: 116 mL/min/{1.73_m2} (ref >59)
GFR calc non Af Amer: 100 mL/min/{1.73_m2} (ref >59)
Globulin, Total: 2.4 g/dL (ref 1.5–4.5)
Glucose: 104 mg/dL — ABNORMAL HIGH (ref 65–99)
Potassium: 4.4 mmol/L (ref 3.5–5.2)
Sodium: 138 mmol/L (ref 134–144)
Total Protein: 7.2 g/dL (ref 6.0–8.5)

## 2019-09-28 LAB — VITAMIN B12: Vitamin B-12: 633 pg/mL (ref 232–1245)

## 2019-09-28 LAB — CBC
Hematocrit: 40.5 % (ref 37.5–51.0)
Hemoglobin: 14.2 g/dL (ref 13.0–17.7)
MCH: 29.6 pg (ref 26.6–33.0)
MCHC: 35.1 g/dL (ref 31.5–35.7)
MCV: 84 fL (ref 79–97)
Platelets: 180 10*3/uL (ref 150–450)
RBC: 4.8 x10E6/uL (ref 4.14–5.80)
RDW: 13.3 % (ref 11.6–15.4)
WBC: 4.9 10*3/uL (ref 3.4–10.8)

## 2019-09-28 LAB — AUTOIMMUNE PROFILE
Anti Nuclear Antibody (ANA): NEGATIVE
Complement C3, Serum: 153 mg/dL (ref 82–167)
dsDNA Ab: <1 IU/mL (ref 0–9)

## 2019-09-28 LAB — TSH: TSH: 2.68 u[IU]/mL (ref 0.450–4.500)

## 2019-10-19 ENCOUNTER — Ambulatory Visit: Payer: Medicare HMO | Attending: Family

## 2019-10-19 DIAGNOSIS — Z23 Encounter for immunization: Secondary | ICD-10-CM

## 2019-10-19 NOTE — Progress Notes (Signed)
   Covid-19 Vaccination Clinic  Name:  Scott Farrell    MRN: [id number] DOB: 22-Jul-1980  10/19/2019  [name] was observed post Covid-19 immunization for 15 minutes without incident. He was provided with Vaccine Information Sheet and instruction to access the V-Safe system.   [name] was instructed to call 911 with any severe reactions post vaccine: Marland Kitchen Difficulty breathing  . Swelling of face and throat  . A fast heartbeat  . A bad rash all over body  . Dizziness and weakness   Immunizations Administered    Name Date Dose VIS Date Route   Moderna COVID-19 Vaccine 10/19/2019  4:47 PM 0.5 mL 05/2019 Intramuscular   Manufacturer: Moderna   [id number]   [id number]

## 2019-11-16 IMAGING — CR DG KNEE COMPLETE 4+V*R*
4 series · 4 of 4 positions shown · non-contrast
Comparison: None.

CLINICAL DATA: Pain to the right knee after fall 1 month ago.

EXAM:
RIGHT KNEE - COMPLETE 4+ VIEW

[knee ap]
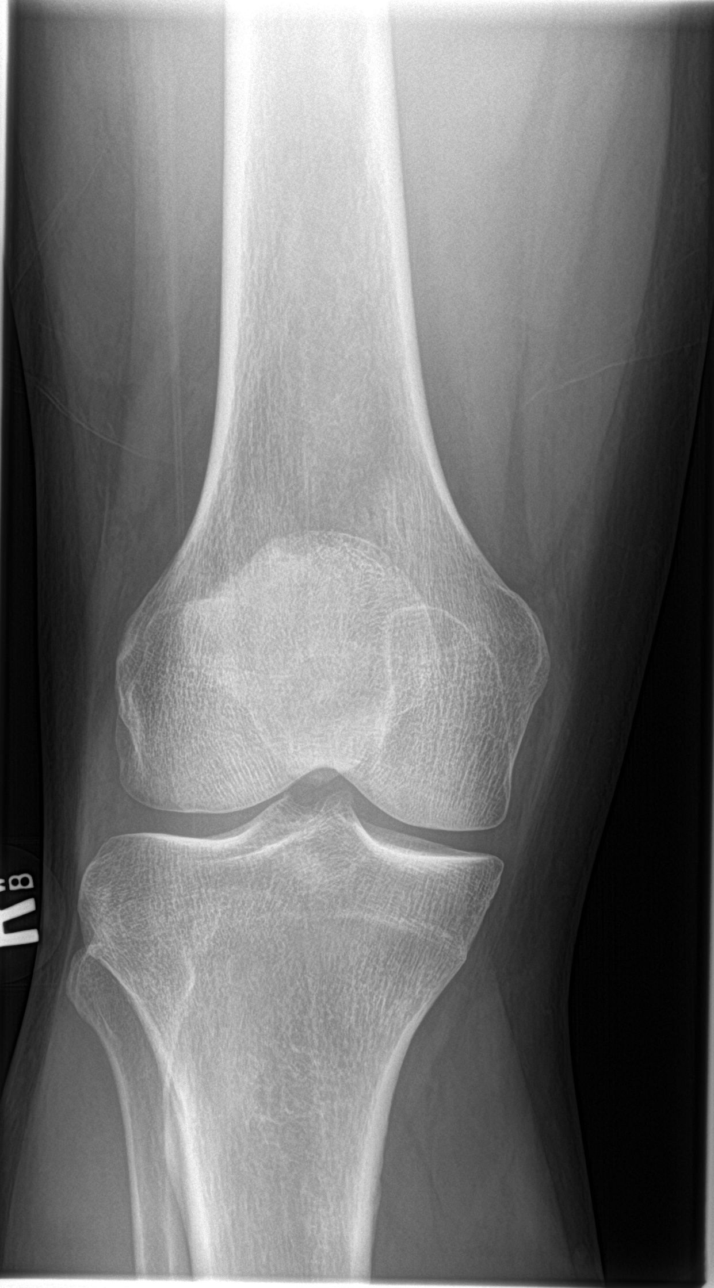

[knee lat]
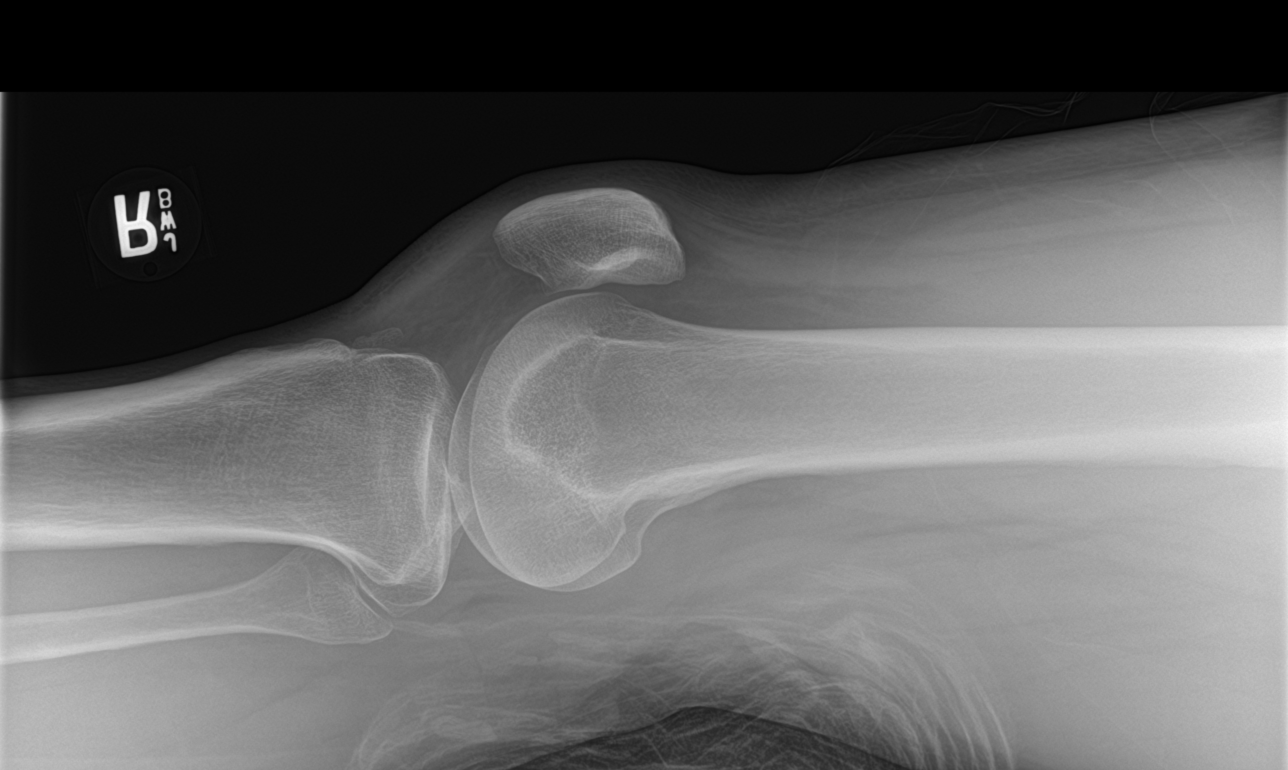

[knee obl (1 of 2)]
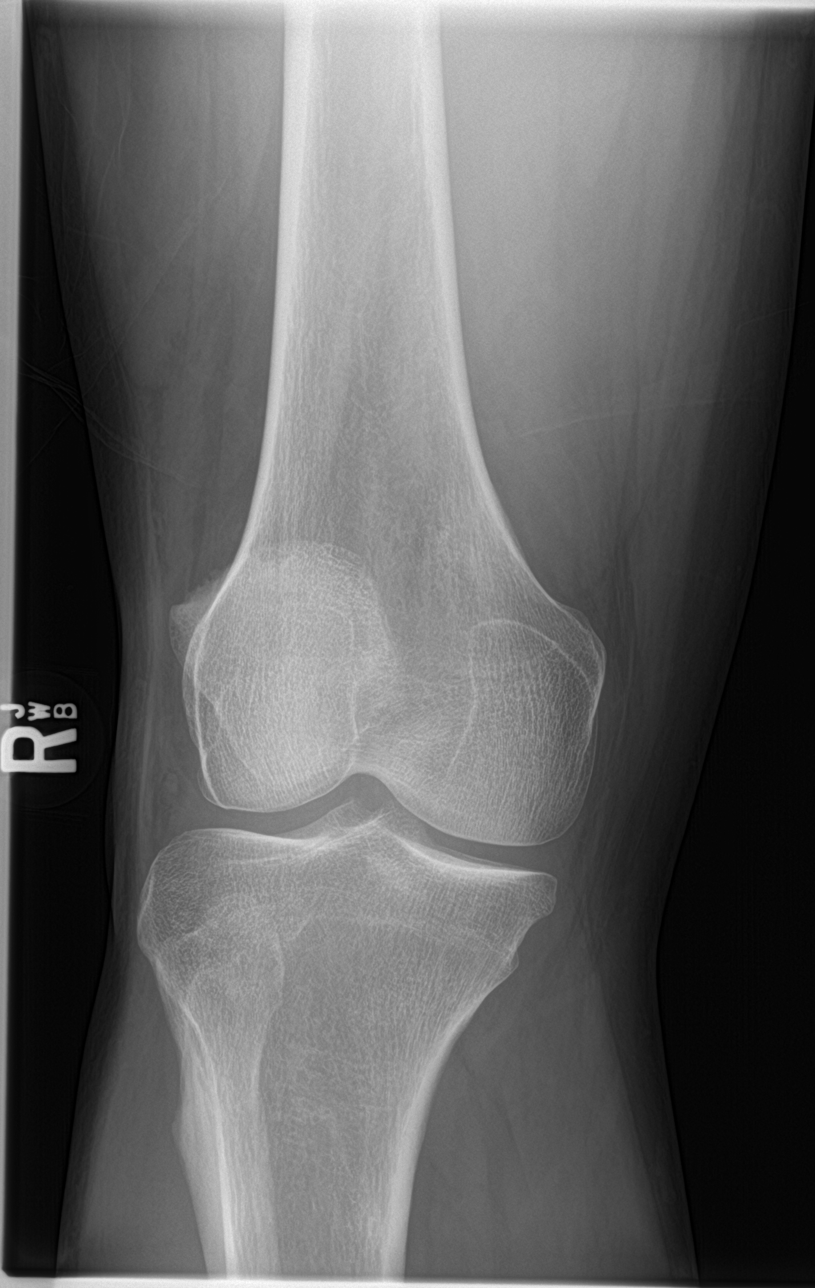

[knee obl (2 of 2)]
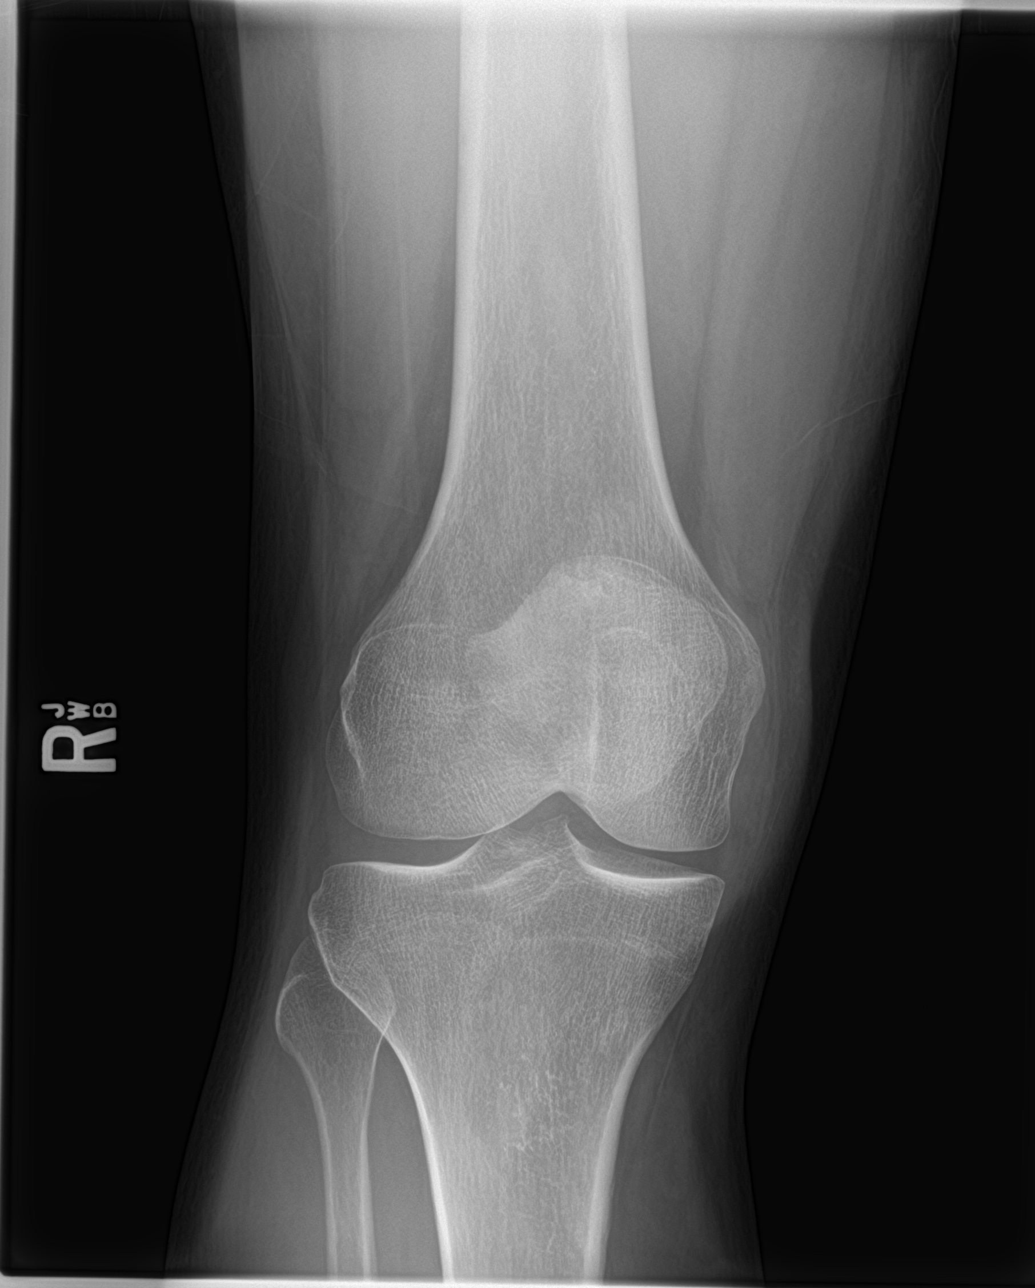

[4 of 4 positions shown; findings below may reference images not displayed]

FINDINGS: No evidence of fracture, dislocation, or joint effusion. No evidence
of arthropathy or other focal bone abnormality. Soft tissues are
unremarkable.
IMPRESSION: Negative.

## 2019-11-30 ENCOUNTER — Ambulatory Visit: Payer: Medicare HMO | Admitting: Family Medicine

## 2019-11-30 ENCOUNTER — Encounter: Payer: Medicare Other | Admitting: Nurse Practitioner

## 2020-01-02 ENCOUNTER — Other Ambulatory Visit (HOSPITAL_COMMUNITY)
Admission: RE | Admit: 2020-01-02 | Discharge: 2020-01-02 | Disposition: A | Discharge status: Home / Self Care | Payer: Medicare Other | Source: Ambulatory Visit | Attending: Nurse Practitioner | Admitting: Nurse Practitioner

## 2020-01-02 ENCOUNTER — Ambulatory Visit (INDEPENDENT_AMBULATORY_CARE_PROVIDER_SITE_OTHER): Payer: Medicare Other | Admitting: Nurse Practitioner

## 2020-01-02 ENCOUNTER — Encounter: Payer: Self-pay | Admitting: Nurse Practitioner

## 2020-01-02 ENCOUNTER — Other Ambulatory Visit: Payer: Self-pay

## 2020-01-02 VITALS — BP 118/80 | HR 64 | Temp 98.0°F | Ht 74.0 in | Wt 244.2 lb

## 2020-01-02 DIAGNOSIS — R39198 Other difficulties with micturition: Secondary | ICD-10-CM

## 2020-01-02 DIAGNOSIS — R300 Dysuria: Secondary | ICD-10-CM

## 2020-01-02 DIAGNOSIS — R350 Frequency of micturition: Secondary | ICD-10-CM | POA: Diagnosis not present

## 2020-01-02 LAB — POCT URINALYSIS DIPSTICK
Bilirubin, UA: NEGATIVE
Glucose, UA: NEGATIVE
Ketones, UA: NEGATIVE
Leukocytes, UA: NEGATIVE
Nitrite, UA: NEGATIVE
Protein, UA: NEGATIVE
Spec Grav, UA: >=1.03 — AB (ref 1.010–1.025)
Urobilinogen, UA: 0.2 E.U./dL
pH, UA: 7 (ref 5.0–8.0)

## 2020-01-02 MED ORDER — CIPROFLOXACIN HCL 500 MG PO TABS: 500.0000 mg | ORAL_TABLET | Freq: Two times a day (BID) | ORAL | 0 refills | Status: AC

## 2020-01-02 NOTE — Patient Instructions (Signed)

## 2020-01-02 NOTE — Progress Notes (Signed)
This visit occurred during the SARS-CoV-2 public health emergency.  Safety protocols were in place, including screening questions prior to the visit, additional usage of staff PPE, and extensive cleaning of exam room while observing appropriate contact time as indicated for disinfecting solutions.  Subjective:     Patient ID: Scott Farrell , male    DOB: Jun 05, 1980 , 39 y.o.   MRN: [id number]   Chief Complaint  Patient presents with  . Urinary Frequency    patient stated he has been urinating more and he also has a needlike pain     HPI  Dysuria  This is a chronic problem. The current episode started 1 to 4 weeks ago (1.5 weeks ago). The problem occurs every urination. The problem has been gradually worsening. The quality of the pain is described as burning, stabbing and shooting. The patient is experiencing no pain. There has been no fever. He is not sexually active. There is no history of pyelonephritis. Associated symptoms include frequency and urgency. Pertinent negatives include no chills or flank pain. He has tried nothing for the symptoms. His past medical history is significant for recurrent UTIs. There is no history of catheterization.     Past Medical History:  Diagnosis Date  . Clotting disorder ([place])    personal hx/o? etiology unclear  . Costochondritis    history of  . 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 History of echocardiogram 01/07/11   normal LV function, EF 60%, trace mitral, tricuspid and pulmonic regurgitation; Dr. Tollie Eth  . IBS (irritable bowel syndrome)   . Mental disorder 2013   hospitalized for psychiatric illness prior  . Normal cardiac stress test 01/07/11   negative treadmill test, no evidence of ischemia, good exercise capacity; Tollie Eth, MD;   . Prostatitis 11/2010   Dr. Karsten Ro  . Renal stone 06/2012   Urology consult  . Schizophrenia [place] [facility])    [facility], [place] - Dr. Lennon Alstrom  . Tobacco use disorder      Family History   Problem Relation Age of Onset  . Hypertension Mother   . Stroke Mother   . Depression Mother   . Acne Mother   . Thyroid disease Mother   . Diabetes Mother   . Colon cancer Mother 8  . Acute lymphoblastic leukemia Father   . Diabetes Father   . Cancer Brother   . Breast cancer Maternal Aunt   . Ovarian cancer Maternal Aunt      Current Outpatient Medications:  .  divalproex (DEPAKOTE ER) 250 MG 24 hr tablet, Take 250 mg by mouth daily. , Disp: , Rfl:  .  risperiDONE (RISPERDAL) 2 MG tablet, Take 2 mg by mouth 2 (two) times daily., Disp: , Rfl:    No Known Allergies   Review of Systems  Constitutional: Negative for chills and fatigue.  Respiratory: Negative.   Cardiovascular: Negative.  Negative for chest pain, palpitations and leg swelling.  Genitourinary: Positive for dysuria, frequency and urgency. Negative for flank pain.  Neurological: Negative for dizziness and headaches.  Psychiatric/Behavioral: Negative.      Today's Vitals   01/02/20 1612  BP: 118/80  Pulse: 64  Temp: 98 F (36.7 C)  TempSrc: Oral  Weight: 244 lb 3.2 oz (110.8 kg)  Height: 6\' 2"  (1.88 m)  PainSc: 0-No pain   Body mass index is 31.35 kg/m.   Objective:  Physical Exam Constitutional:      Appearance: Normal appearance.  Cardiovascular:     Rate and Rhythm:  Normal rate and regular rhythm.     Pulses: Normal pulses.  Pulmonary:     Effort: Pulmonary effort is normal. No respiratory distress.     Breath sounds: Normal breath sounds.  Abdominal:     General: There is distension.     Tenderness: There is abdominal tenderness (right low abdomen).  Neurological:     General: No focal deficit present.     Mental Status: He is alert and oriented to person, place, and time.  Psychiatric:        Mood and Affect: Mood normal.        Behavior: Behavior normal.        Thought Content: Thought content normal.        Judgment: Judgment normal.         Assessment And Plan:     1.  Urinary frequency  Will check urinalysis and treat with cipro due to symptoms  Small blood in urine, will send for culture - POCT Urinalysis Dipstick (58309) - PSA - Culture, Urine - ciprofloxacin (CIPRO) 500 MG tablet; Take 1 tablet (500 mg total) by mouth 2 (two) times daily for 10 days.  Dispense: 20 tablet; Refill: 0  2. Dysuria  This has been persistent  Will treat with for possible urinary tract infection - Urine cytology ancillary only - PSA - Culture, Urine - ciprofloxacin (CIPRO) 500 MG tablet; Take 1 tablet (500 mg total) by mouth 2 (two) times daily for 10 days.  Dispense: 20 tablet; Refill: 0  3. Decreased urine stream  Will check PSA level pending results will refer to urology - PSA     Patient was given opportunity to ask questions. Patient verbalized understanding of the plan and was able to repeat key elements of the plan. All questions were answered to their satisfaction.  [name], FNP   I, [name], FNP, have reviewed all documentation for this visit. The documentation on 01/22/20 for the exam, diagnosis, procedures, and orders are all accurate and complete.  THE PATIENT IS ENCOURAGED TO PRACTICE SOCIAL DISTANCING DUE TO THE COVID-19 PANDEMIC.

## 2020-01-03 LAB — PSA: Prostate Specific Ag, Serum: 0.3 ng/mL (ref 0.0–4.0)

## 2020-01-03 LAB — URINE CULTURE: Organism ID, Bacteria: NO GROWTH

## 2020-01-04 LAB — URINE CYTOLOGY ANCILLARY ONLY
Chlamydia: NEGATIVE
Comment: NEGATIVE
Comment: NEGATIVE
Comment: NORMAL
Neisseria Gonorrhea: NEGATIVE
Trichomonas: NEGATIVE

## 2020-01-08 ENCOUNTER — Other Ambulatory Visit: Payer: Self-pay

## 2020-01-08 DIAGNOSIS — R109 Unspecified abdominal pain: Secondary | ICD-10-CM

## 2020-01-16 ENCOUNTER — Ambulatory Visit
Admission: RE | Admit: 2020-01-16 | Discharge: 2020-01-16 | Disposition: A | Discharge status: Home / Self Care | Payer: Medicare Other | Source: Ambulatory Visit | Attending: Nurse Practitioner | Admitting: Nurse Practitioner

## 2020-01-16 ENCOUNTER — Other Ambulatory Visit: Payer: Self-pay | Admitting: Nurse Practitioner

## 2020-01-16 DIAGNOSIS — N200 Calculus of kidney: Secondary | ICD-10-CM

## 2020-01-16 DIAGNOSIS — R1031 Right lower quadrant pain: Secondary | ICD-10-CM | POA: Diagnosis not present

## 2020-01-16 DIAGNOSIS — R109 Unspecified abdominal pain: Secondary | ICD-10-CM

## 2020-02-28 DIAGNOSIS — N200 Calculus of kidney: Secondary | ICD-10-CM | POA: Diagnosis not present

## 2020-02-28 DIAGNOSIS — R1084 Generalized abdominal pain: Secondary | ICD-10-CM | POA: Diagnosis not present

## 2020-03-08 ENCOUNTER — Telehealth: Payer: Self-pay | Admitting: Nurse Practitioner

## 2020-03-08 NOTE — Telephone Encounter (Signed)
I left a message asking the pt to call and schedule follow up visit

## 2020-04-01 DIAGNOSIS — N200 Calculus of kidney: Secondary | ICD-10-CM | POA: Diagnosis not present

## 2020-04-01 DIAGNOSIS — R160 Hepatomegaly, not elsewhere classified: Secondary | ICD-10-CM | POA: Diagnosis not present

## 2020-04-01 DIAGNOSIS — K429 Umbilical hernia without obstruction or gangrene: Secondary | ICD-10-CM | POA: Diagnosis not present

## 2020-05-27 ENCOUNTER — Encounter: Payer: Self-pay | Admitting: Nurse Practitioner

## 2020-05-27 ENCOUNTER — Other Ambulatory Visit: Payer: Self-pay

## 2020-05-27 ENCOUNTER — Ambulatory Visit (INDEPENDENT_AMBULATORY_CARE_PROVIDER_SITE_OTHER): Payer: Medicare Other | Admitting: Nurse Practitioner

## 2020-05-27 VITALS — BP 136/74 | HR 84 | Temp 98.5°F | Ht 75.2 in | Wt 242.4 lb

## 2020-05-27 DIAGNOSIS — Z720 Tobacco use: Secondary | ICD-10-CM

## 2020-05-27 DIAGNOSIS — Z0000 Encounter for general adult medical examination without abnormal findings: Secondary | ICD-10-CM | POA: Diagnosis not present

## 2020-05-27 DIAGNOSIS — Z860100 Personal history of colon polyps, unspecified: Secondary | ICD-10-CM

## 2020-05-27 DIAGNOSIS — Z1159 Encounter for screening for other viral diseases: Secondary | ICD-10-CM

## 2020-05-27 DIAGNOSIS — R7309 Other abnormal glucose: Secondary | ICD-10-CM | POA: Diagnosis not present

## 2020-05-27 DIAGNOSIS — R350 Frequency of micturition: Secondary | ICD-10-CM

## 2020-05-27 DIAGNOSIS — E781 Pure hyperglyceridemia: Secondary | ICD-10-CM | POA: Diagnosis not present

## 2020-05-27 DIAGNOSIS — Z8719 Personal history of other diseases of the digestive system: Secondary | ICD-10-CM

## 2020-05-27 DIAGNOSIS — Z6830 Body mass index (BMI) 30.0-30.9, adult: Secondary | ICD-10-CM

## 2020-05-27 DIAGNOSIS — R7303 Prediabetes: Secondary | ICD-10-CM | POA: Diagnosis not present

## 2020-05-27 DIAGNOSIS — Z8659 Personal history of other mental and behavioral disorders: Secondary | ICD-10-CM

## 2020-05-27 LAB — POCT URINALYSIS DIPSTICK
Bilirubin, UA: NEGATIVE
Glucose, UA: NEGATIVE
Ketones, UA: NEGATIVE
Leukocytes, UA: NEGATIVE
Nitrite, UA: NEGATIVE
Protein, UA: NEGATIVE
Spec Grav, UA: 1.025 (ref 1.010–1.025)
Urobilinogen, UA: 0.2 E.U./dL
pH, UA: 6 (ref 5.0–8.0)

## 2020-05-27 NOTE — Patient Instructions (Signed)

## 2020-05-27 NOTE — Progress Notes (Signed)
This visit occurred during the SARS-CoV-2 public health emergency.  Safety protocols were in place, including screening questions prior to the visit, additional usage of staff PPE, and extensive cleaning of exam room while observing appropriate contact time as indicated for disinfecting solutions.  Subjective:     Patient ID: Scott Farrell , male    DOB: [birth year] , 39 y.o.   MRN: [id number]   Chief Complaint  Patient presents with  . Annual Exam  . Urinary Frequency    HPI  Here for hm. He is currently working at a [employer]".      Past Medical History:  Diagnosis Date  . Clotting disorder ([place])    personal hx/o? etiology unclear  . Costochondritis    history of  . 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 History of echocardiogram 01/07/11   normal LV function, EF 60%, trace mitral, tricuspid and pulmonic regurgitation; Dr. Tollie Eth  . IBS (irritable bowel syndrome)   . Mental disorder 2013   hospitalized for psychiatric illness prior  . Normal cardiac stress test 01/07/11   negative treadmill test, no evidence of ischemia, good exercise capacity; Tollie Eth, MD;   . Prostatitis 11/2010   Dr. Karsten Ro  . Renal stone 06/2012   Urology consult  . Schizophrenia [facility])    [facility], [place] - Dr. Lennon Alstrom  . Tobacco use disorder      Family History  Problem Relation Age of Onset  . Hypertension Mother   . Stroke Mother   . Depression Mother   . Acne Mother   . Thyroid disease Mother   . Diabetes Mother   . Colon cancer Mother 16  . Acute lymphoblastic leukemia Father   . Diabetes Father   . Cancer Brother   . Breast cancer Maternal Aunt   . Ovarian cancer Maternal Aunt      Current Outpatient Medications:  .  divalproex (DEPAKOTE ER) 250 MG 24 hr tablet, Take 250 mg by mouth daily. , Disp: , Rfl:  .  risperiDONE (RISPERDAL) 2 MG tablet, Take 2 mg by mouth 2 (two) times daily., Disp: , Rfl:    No Known Allergies   Men's preventive  visit. Patient Health Questionnaire (PHQ-2) is  Scott Farrell Visit from 09/27/2019 in [facility]  PHQ-2 Total Score 3     Patient is on a  Regular diet.  Exercises by walking 2-3 days a week.  He also does jumping jacks, free weights at home.  Marital status: Single.  He has one daughter - healthy - 65 y/o.  Relevant history for alcohol use is:  Social History   Substance and Sexual Activity  Alcohol Use Yes   Comment: occ   Relevant history for tobacco use is:  Social History   Tobacco Use  Smoking Status Current Every Day Smoker  . Packs/day: 1.00  . Years: 23.00  . Pack years: 23.00  . Types: Cigarettes, Cigars  Smokeless Tobacco Never Used  Tobacco Comment   he has cut back to 5-6 cigarettes per day  .   Review of Systems  Constitutional: Negative.   HENT: Negative.   Eyes: Negative.   Respiratory: Negative.   Cardiovascular: Negative.   Gastrointestinal: Negative.        Reports history of irritable bowel syndrome  Endocrine: Negative.   Genitourinary: Positive for frequency (for the last 6-7 years - which causes him problems with keeping a job. ).  Musculoskeletal: Negative.   Skin:  Negative.   Neurological: Negative.   Hematological: Negative.   Psychiatric/Behavioral: Negative.      Today's Vitals   05/27/20 1445  BP: 136/74  Pulse: 84  Temp: 98.5 F (36.9 C)  TempSrc: Oral  Weight: 242 lb 6.4 oz (110 kg)  Height: 6' 3.2" (1.91 m)  PainSc: 0-No pain   Body mass index is 30.14 kg/m.   Objective:  Physical Exam Vitals reviewed.  Constitutional:      General: He is not in acute distress.    Appearance: Normal appearance. He is obese.  HENT:     Head: Normocephalic and atraumatic.     Right Ear: Tympanic membrane, ear canal and external ear normal. There is no impacted cerumen.     Left Ear: Tympanic membrane, ear canal and external ear normal. There is no impacted cerumen.     Nose:     Comments: Deferred -  masked    Mouth/Throat:     Comments: Deferred - masked Cardiovascular:     Rate and Rhythm: Normal rate and regular rhythm.     Pulses: Normal pulses.     Heart sounds: Normal heart sounds. No murmur heard.   Pulmonary:     Effort: Pulmonary effort is normal. No respiratory distress.     Breath sounds: Normal breath sounds. No wheezing.  Abdominal:     General: Abdomen is flat. Bowel sounds are normal. There is no distension.     Palpations: Abdomen is soft.     Tenderness: There is no abdominal tenderness.  Genitourinary:    Prostate: Normal.     Rectum: Guaiac result negative.  Musculoskeletal:        General: Normal range of motion.     Cervical back: Normal range of motion and neck supple.  Skin:    General: Skin is warm.     Capillary Refill: Capillary refill takes less than 2 seconds.  Neurological:     General: No focal deficit present.     Mental Status: He is alert and oriented to person, place, and time.     Cranial Nerves: No cranial nerve deficit.  Psychiatric:        Mood and Affect: Mood normal.        Behavior: Behavior normal.        Thought Content: Thought content normal.        Judgment: Judgment normal.         Assessment And Plan:    1. Encounter for general adult medical examination w/o abnormal findings Behavior modifications discussed and diet history reviewed.   Pt will continue to exercise regularly and modify diet with low GI, plant based foods and decrease intake of processed foods.  Recommend intake of daily multivitamin, Vitamin D, and calcium.  Recommend mammogram and colonoscopy for preventive screenings, as well as recommend immunizations that include influenza, TDAP (will wait for tetanus has recently had covid vaccine) - CMP14+EGFR - Lipid panel  2. Urinary frequency  Will check urinalysis, PSA and HgbA1c he has a family history of diabetes  He does describe this occurring for the last 6-7 years and has not been evaluated by  [facility] pending results will refer back to Urology for further evaluation. - POCT Urinalysis Dipstick (81002) - PSA - Hemoglobin A1c  3. History of colon polyps  He was due to have a repeat colonoscopy in 2019, referred to [facility] - Ambulatory referral to Gastroenterology  4. Encounter for hepatitis C screening test for low  risk patient  Will check Hepatitis C screening due to recent recommendations to screen all adults 18 years and older - Hepatitis C antibody  5. History of irritable bowel syndrome I have given him samples of IB gard with a coupon   6. Tobacco abuse  Smoking cessation instruction/counseling given:  counseled patient on the dangers of tobacco use, advised patient to stop smoking, and reviewed strategies to maximize success   7. BMI 30.0-30.9,adult  Chronic  Discussed healthy diet and regular exercise options   Encouraged to exercise at least 150 minutes per week with 2 days of strength training  8. History of schizophrenia      Patient was given opportunity to ask questions. Patient verbalized understanding of the plan and was able to repeat key elements of the plan. All questions were answered to their satisfaction.   [name], FNP   I, [name], FNP, have reviewed all documentation for this visit. The documentation on 05/27/20 for the exam, diagnosis, procedures, and orders are all accurate and complete.   THE PATIENT IS ENCOURAGED TO PRACTICE SOCIAL DISTANCING DUE TO THE COVID-19 PANDEMIC.

## 2020-05-28 ENCOUNTER — Other Ambulatory Visit: Payer: Self-pay

## 2020-05-28 LAB — CMP14+EGFR
ALT: 19 IU/L (ref 0–44)
AST: 22 IU/L (ref 0–40)
Albumin/Globulin Ratio: 1.8 (ref 1.2–2.2)
Albumin: 4.8 g/dL (ref 4.0–5.0)
Alkaline Phosphatase: 80 IU/L (ref 44–121)
BUN/Creatinine Ratio: 11 (ref 9–20)
BUN: 12 mg/dL (ref 6–20)
Bilirubin Total: 0.4 mg/dL (ref 0.0–1.2)
CO2: 21 mmol/L (ref 20–29)
Calcium: 9.7 mg/dL (ref 8.7–10.2)
Chloride: 103 mmol/L (ref 96–106)
Creatinine, Ser: 1.07 mg/dL (ref 0.76–1.27)
GFR calc Af Amer: 101 mL/min/{1.73_m2} (ref >59)
GFR calc non Af Amer: 87 mL/min/{1.73_m2} (ref >59)
Globulin, Total: 2.7 g/dL (ref 1.5–4.5)
Glucose: 102 mg/dL — ABNORMAL HIGH (ref 65–99)
Potassium: 4.2 mmol/L (ref 3.5–5.2)
Sodium: 139 mmol/L (ref 134–144)
Total Protein: 7.5 g/dL (ref 6.0–8.5)

## 2020-05-28 LAB — PSA: Prostate Specific Ag, Serum: 0.4 ng/mL (ref 0.0–4.0)

## 2020-05-28 LAB — HEMOGLOBIN A1C
Est. average glucose Bld gHb Est-mCnc: 126 mg/dL
Hgb A1c MFr Bld: 6 % — ABNORMAL HIGH (ref 4.8–5.6)

## 2020-05-28 LAB — LIPID PANEL
Chol/HDL Ratio: 6 ratio — ABNORMAL HIGH (ref 0.0–5.0)
Cholesterol, Total: 155 mg/dL (ref 100–199)
HDL: 26 mg/dL — ABNORMAL LOW (ref >39)
LDL Chol Calc (NIH): 91 mg/dL (ref 0–99)
Triglycerides: 224 mg/dL — ABNORMAL HIGH (ref 0–149)
VLDL Cholesterol Cal: 38 mg/dL (ref 5–40)

## 2020-05-28 LAB — URINE CULTURE: Organism ID, Bacteria: NO GROWTH

## 2020-05-28 LAB — HEPATITIS C ANTIBODY: Hep C Virus Ab: <0.1 s/co ratio (ref 0.0–0.9)

## 2020-05-28 MED ORDER — METFORMIN HCL ER 500 MG PO TB24: 500.0000 mg | ORAL_TABLET | Freq: Every day | ORAL | 2 refills | Status: DC

## 2020-05-29 ENCOUNTER — Telehealth: Payer: Self-pay

## 2020-05-29 NOTE — Telephone Encounter (Addendum)
-----   Message from Arnette Felts, FNP sent at 05/29/2020  9:06 AM EST ----- You do not have a urinary tract infection. Your Hepatitis c panel is negative. Kidney and liver functions are normal. Your total cholesterol is normal. Triglycerides are elevated at 224 goal is less than 149 this is found in breads and sweets cut back on these foods. PSA is normal. Your HgbA1c which checks for diabetes or prediabetes is slightly elevated in the prediabetic range, should be less than 5.6, this could be causing some of your frequency in urination, I would like to start you on a medication called metformin to see if this improves. You will take once a day to start and we will have you to follow up in 4 weeks for medication check and recheck the levels in 3 months.

## 2020-07-03 ENCOUNTER — Other Ambulatory Visit: Payer: Self-pay

## 2020-07-03 ENCOUNTER — Telehealth: Payer: Self-pay

## 2020-07-03 ENCOUNTER — Encounter: Payer: Self-pay | Admitting: Nurse Practitioner

## 2020-07-03 ENCOUNTER — Ambulatory Visit (INDEPENDENT_AMBULATORY_CARE_PROVIDER_SITE_OTHER): Payer: Medicare (Managed Care)

## 2020-07-03 ENCOUNTER — Telehealth (INDEPENDENT_AMBULATORY_CARE_PROVIDER_SITE_OTHER): Payer: Medicare (Managed Care) | Admitting: Nurse Practitioner

## 2020-07-03 VITALS — Ht 75.5 in | Wt 235.0 lb

## 2020-07-03 VITALS — Wt 240.0 lb

## 2020-07-03 DIAGNOSIS — R7303 Prediabetes: Secondary | ICD-10-CM

## 2020-07-03 DIAGNOSIS — Z0000 Encounter for general adult medical examination without abnormal findings: Secondary | ICD-10-CM | POA: Diagnosis not present

## 2020-07-03 DIAGNOSIS — R519 Headache, unspecified: Secondary | ICD-10-CM

## 2020-07-03 MED ORDER — METFORMIN HCL ER 500 MG PO TB24: 500.0000 mg | ORAL_TABLET | Freq: Every day | ORAL | 1 refills | Status: DC

## 2020-07-03 NOTE — Progress Notes (Signed)
Virtual Visit via failed Video visit changed to telephone   [employer] as a Education administrator for [name], FNP.,have documented all relevant documentation on the behalf of [name], FNP,as directed by  [name], FNP while in the presence of [name], [place].  This visit type was conducted due to national recommendations for restrictions regarding the COVID-19 Pandemic (e.g. social distancing) in an effort to limit this patient's exposure and mitigate transmission in our community.  Due to his co-morbid illnesses, this patient is at least at moderate risk for complications without adequate follow up.  This format is felt to be most appropriate for this patient at this time.  All issues noted in this document were discussed and addressed.  A limited physical exam was performed with this format.    This visit type was conducted due to national recommendations for restrictions regarding the COVID-19 Pandemic (e.g. social distancing) in an effort to limit this patient's exposure and mitigate transmission in our community.  Patients identity confirmed using two different identifiers.  This format is felt to be most appropriate for this patient at this time.  All issues noted in this document were discussed and addressed.  No physical exam was performed (except for noted visual exam findings with Video Visits).    Date:  07/28/2020   ID:  Scott Farrell, DOB [birth year], MRN [id number]  Patient Location:  Home - spoke with Scott Farrell  Provider location:   Office    Chief Complaint:  headache  History of Present Illness:    Scott Farrell is a 40 y.o. male who presents via video conferencing for a telehealth visit today.    The patient does not have symptoms concerning for COVID-19 infection (fever, chills, cough, or new shortness of breath).   He says he had a headache which caused him to not want to walk to the office. His visit today is for a follow up  after starting metformin. Overall he is doing well with the medication. Has mild GI upset but is tolerable.  Headache  This is a new problem. The current episode started more than 1 month ago. The pain does not radiate. The pain quality is similar to prior headaches. The quality of the pain is described as aching. The pain is mild. Pertinent negatives include no abdominal pain, dizziness or numbness.     Past Medical History:  Diagnosis Date  . Clotting disorder ([place])    personal hx/o? etiology unclear  . Costochondritis    history of  . 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 History of echocardiogram 01/07/11   normal LV function, EF 60%, trace mitral, tricuspid and pulmonic regurgitation; Dr. Tollie Eth  . IBS (irritable bowel syndrome)   . Mental disorder 2013   hospitalized for psychiatric illness prior  . Normal cardiac stress test 01/07/11   negative treadmill test, no evidence of ischemia, good exercise capacity; Tollie Eth, MD;   . Prostatitis 11/2010   Dr. Karsten Ro  . Renal stone 06/2012   Urology consult  . Schizophrenia [facility])    [facility], [place] - Dr. Lennon Alstrom  . Tobacco use disorder    Past Surgical History:  Procedure Laterality Date  . EUS  06/30/2012   Procedure: UPPER ENDOSCOPIC ULTRASOUND (EUS) LINEAR;  Surgeon: Milus Banister, MD;  Location: [facility] ENDOSCOPY;  Service: Endoscopy;  Laterality: N/A;  radial linear  . WISDOM TOOTH EXTRACTION       Current Meds  Medication Sig  .  divalproex (DEPAKOTE ER) 250 MG 24 hr tablet Take 250 mg by mouth daily.   . risperiDONE (RISPERDAL) 2 MG tablet Take 2 mg by mouth 2 (two) times daily.  . [DISCONTINUED] metFORMIN (GLUCOPHAGE XR) 500 MG 24 hr tablet Take 1 tablet (500 mg total) by mouth daily with breakfast.     Allergies:   Patient has no known allergies.   Social History   Tobacco Use  . Smoking status: Current Some Day Smoker    Packs/day: 1.00    Years: 23.00    Pack years: 23.00    Types: Cigars  .  Smokeless tobacco: Never Used  . Tobacco comment: 1-2 black milds daily  Vaping Use  . Vaping Use: Former  . Substances: Nicotine  Substance Use Topics  . Alcohol use: Yes    Comment: occ  . Drug use: Not Currently     Family Hx: The patient's family history includes Acne in his mother; Acute lymphoblastic leukemia in his father; Breast cancer in his maternal aunt; Cancer in his brother; Colon cancer (age of onset: 41) in his mother; Depression in his mother; Diabetes in his father and mother; Hypertension in his mother; Ovarian cancer in his maternal aunt; Stroke in his mother; Thyroid disease in his mother.  ROS:   Please see the history of present illness.    Review of Systems  Constitutional: Negative.   Respiratory: Negative.   Cardiovascular: Negative.   Gastrointestinal: Negative for abdominal pain.  Neurological: Positive for headaches (limiting him from wanting to walk to the office). Negative for dizziness and numbness.  Psychiatric/Behavioral: Negative.     All other systems reviewed and are negative.   Labs/Other Tests and Data Reviewed:    Recent Labs: 09/27/2019: Hemoglobin 14.2; Platelets 180; TSH 2.680 05/27/2020: ALT 19; BUN 12; Creatinine, Ser 1.07; Potassium 4.2; Sodium 139   Recent Lipid Panel Lab Results  Component Value Date/Time   CHOL 155 05/27/2020 03:52 PM   TRIG 224 (H) 05/27/2020 03:52 PM   HDL 26 (L) 05/27/2020 03:52 PM   CHOLHDL 6.0 (H) 05/27/2020 03:52 PM   CHOLHDL 3.9 07/22/2012 08:21 AM   LDLCALC 91 05/27/2020 03:52 PM    Wt Readings from Last 3 Encounters:  07/03/20 235 lb (106.6 kg)  07/03/20 240 lb (108.9 kg)  05/27/20 242 lb 6.4 oz (110 kg)     Exam:    Vital Signs:  Wt 240 lb (108.9 kg)   BMI 29.84 kg/m     Physical Exam Vitals reviewed.  Constitutional:      General: He is not in acute distress. Pulmonary:     Effort: Pulmonary effort is normal. No respiratory distress.  Neurological:     Mental Status: He is alert.   Psychiatric:        Mood and Affect: Mood normal. Mood is not anxious.        Speech: Speech normal.        Behavior: Behavior normal.     ASSESSMENT & PLAN:    1. Prediabetes  I am changing his metformin to XR due to the GI upset - metFORMIN (GLUCOPHAGE XR) 500 MG 24 hr tablet; Take 1 tablet (500 mg total) by mouth daily with breakfast.  Dispense: 90 tablet; Refill: 1  2. Acute nonintractable headache, unspecified headache type  He it to take tylenol or excedrin migraine for his headache  If this does not improve he is to call back to office.   COVID-19 Education: The signs and symptoms  of COVID-19 were discussed with the patient and how to seek care for testing (follow up with PCP or arrange E-visit).  The importance of social distancing was discussed today.  Patient Risk:   After full review of this patients clinical status, I feel that they are at least moderate risk at this time.  Time:   Today, I have spent 11 minutes/ seconds with the patient with telehealth technology discussing above diagnoses.     Medication Adjustments/Labs and Tests Ordered: Current medicines are reviewed at length with the patient today.  Concerns regarding medicines are outlined above.   Tests Ordered: No orders of the defined types were placed in this encounter.   Medication Changes: Meds ordered this encounter  Medications  . metFORMIN (GLUCOPHAGE XR) 500 MG 24 hr tablet    Sig: Take 1 tablet (500 mg total) by mouth daily with breakfast.    Dispense:  90 tablet    Refill:  1    Disposition:  Follow up in 3 month(s)  Signed, [name], FNP

## 2020-07-03 NOTE — Telephone Encounter (Signed)
Patient consented to virtual appointment for OV and AWV [facility]

## 2020-07-03 NOTE — Progress Notes (Signed)
I connected with Scott Farrell today by telephone and verified that I am speaking with the correct person using two identifiers. Location patient: home Location provider: work Persons participating in the virtual visit: [place] LPN   I discussed the limitations, risks, security and privacy concerns of performing an evaluation and management service by telephone and the availability of in person appointments. I also discussed with the patient that there may be a patient responsible charge related to this service. The patient expressed understanding and verbally consented to this telephonic visit.    Interactive audio and video telecommunications were attempted between this provider and patient, however failed, due to patient having technical difficulties OR patient did not have access to video capability.  We continued and completed visit with audio only.     Vital signs may be patient reported or missing.  Subjective:   Scott Farrell is a 40 y.o. male who presents for an Initial Medicare Annual Wellness Visit.  Review of Systems     Cardiac Risk Factors include: male gender;sedentary lifestyle;smoking/ tobacco exposure     Objective:    Today's Vitals   07/03/20 1540 07/03/20 1541  Weight: 235 lb (106.6 kg)   Height: 6' 3.5" (1.918 m)   PainSc:  5    Body mass index is 28.99 kg/m.  Advanced Directives 07/03/2020 11/23/2016 06/06/2015 04/10/2015 01/03/2015 10/13/2014 09/19/2014  Does Patient Have a Medical Advance Directive? No No No No No No No  Would patient like information on creating a medical advance directive? - No - Patient declined No - patient declined information - - No - patient declined information No - patient declined information    Current Medications (verified) Outpatient Encounter Medications as of 07/03/2020  Medication Sig  . divalproex (DEPAKOTE ER) 250 MG 24 hr tablet Take 250 mg by mouth daily.   . metFORMIN (GLUCOPHAGE XR) 500 MG  24 hr tablet Take 1 tablet (500 mg total) by mouth daily with breakfast.  . risperiDONE (RISPERDAL) 2 MG tablet Take 2 mg by mouth 2 (two) times daily.   No facility-administered encounter medications on file as of 07/03/2020.    Allergies (verified) Patient has no known allergies.   History: Past Medical History:  Diagnosis Date  . Clotting disorder ([place])    personal hx/o? etiology unclear  . Costochondritis    history of  . 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 History of echocardiogram 01/07/11   normal LV function, EF 60%, trace mitral, tricuspid and pulmonic regurgitation; Dr. Tollie Eth  . IBS (irritable bowel syndrome)   . Mental disorder 2013   hospitalized for psychiatric illness prior  . Normal cardiac stress test 01/07/11   negative treadmill test, no evidence of ischemia, good exercise capacity; Tollie Eth, MD;   . Prostatitis 11/2010   Dr. Karsten Ro  . Renal stone 06/2012   Urology consult  . Schizophrenia [facility])    [facility], [place] - Dr. Lennon Alstrom  . Tobacco use disorder    Past Surgical History:  Procedure Laterality Date  . EUS  06/30/2012   Procedure: UPPER ENDOSCOPIC ULTRASOUND (EUS) LINEAR;  Surgeon: Milus Banister, MD;  Location: [facility] END[facility]COPY;  Service: Endoscopy;  Laterality: N/A;  radial linear  . WISDOM TOOTH EXTRACTION     Family History  Problem Relation Age of Onset  . Hypertension Mother   . Stroke Mother   . Depression Mother   . Acne Mother   . Thyroid disease Mother   . Diabetes Mother   .  Colon cancer Mother 33  . Acute lymphoblastic leukemia Father   . Diabetes Father   . Cancer Brother   . Breast cancer Maternal Aunt   . Ovarian cancer Maternal Aunt    Social History   Socioeconomic History  . Marital status: Single    Spouse name: Not on file  . Number of children: Not on file  . Years of education: Not on file  . Highest education level: Not on file  Occupational History  . Occupation: [employer]  Tobacco Use  . Smoking  status: Current Some Day Smoker    Packs/day: 1.00    Years: 23.00    Pack years: 23.00    Types: Cigars  . Smokeless tobacco: Never Used  . Tobacco comment: 1-2 black milds daily  Vaping Use  . Vaping Use: Former  . Substances: Nicotine  Substance and Sexual Activity  . Alcohol use: Yes    Comment: occ  . Drug use: Not Currently  . Sexual activity: Not Currently    Partners: Female  Other Topics Concern  . Not on file  Social History Narrative   Lives with mother, Scott Farrell, exercising with walking, running, no current relationship; unemployed   Caffeine use: 1 cup per day   Right handed   Social Determinants of Health   Financial Resource Strain: Low Risk   . Difficulty of Paying Living Expenses: Not hard at all  Food Insecurity: No Food Insecurity  . Worried About Charity fundraiser in the Last Year: Never true  . Ran Out of Food in the Last Year: Never true  Transportation Needs: No Transportation Needs  . Lack of Transportation (Medical): No  . Lack of Transportation (Non-Medical): No  Physical Activity: Inactive  . Days of Exercise per Week: 0 days  . Minutes of Exercise per Session: 0 min  Stress: Stress Concern Present  . Feeling of Stress : To some extent  Social Connections: Not on file    Tobacco Counseling Ready to quit: Not Answered Counseling given: Not Answered Comment: 1-2 black milds daily   Clinical Intake:  Pre-visit preparation completed: Yes  Pain : 0-10 Pain Score: 5  Pain Type: Chronic pain Pain Location: Head Pain Descriptors / Indicators: Aching Pain Onset: Today Pain Frequency: Intermittent     Nutritional Status: BMI 25 -29 Overweight Nutritional Risks: Nausea/ vomitting/ diarrhea (diarrhea for long time) Diabetes: No  How often do you need to have someone help you when you read instructions, pamphlets, or other written materials from your doctor or pharmacy?: 1 - Never What is the last grade level you completed in school?:  college  Diabetic? no  Interpreter Needed?: No  Information entered by :: NAllen LPN   Activities of Daily Living In your present state of health, do you have any difficulty performing the following activities: 07/03/2020 09/27/2019  Hearing? Y N  Comment due to wax build up -  Vision? Y Y  Comment blurry sometimes -  Difficulty concentrating or making decisions? Y N  Comment sometimes -  Walking or climbing stairs? N N  Dressing or bathing? N N  Doing errands, shopping? N N  Preparing Food and eating ? N -  Using the Toilet? N -  In the past six months, have you accidently leaked urine? N -  Do you have problems with loss of bowel control? N -  Managing your Medications? N -  Managing your Finances? N -  Housekeeping or managing your Housekeeping? N -  Some recent data might be hidden    Patient Care Team: [name], FNP as PCP - General (General Practice)  Indicate any recent Medical Services you may have received from other than [facility] providers in the past year (date may be approximate).     Assessment:   This is a routine wellness examination for Leevi.  Hearing/Vision screen No exam data present  Dietary issues and exercise activities discussed: Current Exercise Habits: The patient does not participate in regular exercise at present  Goals    . Patient Stated     07/03/2020, wants to lose 40 pounds      Depression Screen PHQ 2/9 Scores 07/03/2020 09/27/2019  PHQ - 2 Score 2 3  PHQ- 9 Score 6 8    Fall Risk Fall Risk  07/03/2020  Falls in the past year? 0  Risk for fall due to : Medication side effect  Follow up Falls evaluation completed;Education provided;Falls prevention discussed    FALL RISK PREVENTION PERTAINING TO THE HOME:  Any stairs in or around the home? Yes  If so, are there any without handrails? Yes  Home free of loose throw rugs in walkways, pet beds, electrical cords, etc? Yes  Adequate lighting in your home to reduce risk of falls?  No   ASSISTIVE DEVICES UTILIZED TO PREVENT FALLS:  Life alert? No  Use of a cane, walker or w/c? No  Grab bars in the bathroom? No  Shower chair or bench in shower? No  Elevated toilet seat or a handicapped toilet? Yes   TIMED UP AND GO:  Was the test performed? No .   Cognitive Function:     6CIT Screen 07/03/2020  What Year? 0 points  What month? 0 points  What time? 0 points  Count back from 20 0 points  Months in reverse 0 points  Repeat phrase 0 points  Total Score 0    Immunizations Immunization History  Administered Date(s) Administered  . Influenza Inj Mdck Quad Pf 06/02/2018  . Moderna Sars-Covid-2 Vaccination 09/18/2019, 10/19/2019, 05/18/2020    TDAP status: Due, Education has been provided regarding the importance of this vaccine. Advised may receive this vaccine at local pharmacy or Health Dept. Aware to provide a copy of the vaccination record if obtained from local pharmacy or Health Dept. Verbalized acceptance and understanding.  Flu Vaccine status: Up to date  Pneumococcal vaccine status: Up to date  Covid-19 vaccine status: Completed vaccines  Qualifies for Shingles Vaccine? No   Zostavax completed n/a  Shingrix Completed?: n/a  Screening Tests Health Maintenance  Topic Date Due  . COLONOSCOPY (Pts 45-20yrs Insurance coverage will need to be confirmed)  08/09/2017  . INFLUENZA VACCINE  12/31/2019  . TETANUS/TDAP  04/18/2021 (Originally 06/20/1999)  . COVID-19 Vaccine  Completed  . Hepatitis C Screening  Completed  . HIV Screening  Completed    Health Maintenance  Health Maintenance Due  Topic Date Due  . COLONOSCOPY (Pts 45-64yrs Insurance coverage will need to be confirmed)  08/09/2017  . INFLUENZA VACCINE  12/31/2019    Colorectal cancer screening: pending a follow up  Lung Cancer Screening: (Low Dose CT Chest recommended if Age 65-80 years, 30 pack-year currently smoking OR have quit w/in 15years.) does not qualify.   Lung Cancer  Screening Referral: no  Additional Screening:  Hepatitis C Screening: does qualify; Completed 05/27/2020  Vision Screening: Recommended annual ophthalmology exams for early detection of glaucoma and other disorders of the eye. Is the patient up to  date with their annual eye exam?  No  Who is the provider or what is the name of the office in which the patient attends annual eye exams? none If pt is not established with a provider, would they like to be referred to a provider to establish care? No .   Dental Screening: Recommended annual dental exams for proper oral hygiene  Community Resource Referral / Chronic Care Management: CRR required this visit?  No   CCM required this visit?  No      Plan:     I have personally reviewed and noted the following in the patient's chart:   . Medical and social history . Use of alcohol, tobacco or illicit drugs  . Current medications and supplements . Functional ability and status . Nutritional status . Physical activity . Advanced directives . List of other physicians . Hospitalizations, surgeries, and ER visits in previous 12 months . Vitals . Screenings to include cognitive, depression, and falls . Referrals and appointments  In addition, I have reviewed and discussed with patient certain preventive protocols, quality metrics, and best practice recommendations. A written personalized care plan for preventive services as well as general preventive health recommendations were provided to patient.     Kellie Simmering, LPN   [id number]   Nurse Notes:

## 2020-07-03 NOTE — Patient Instructions (Signed)
Scott Farrell , Thank you for taking time to come for your Medicare Wellness Visit. I appreciate your ongoing commitment to your health goals. Please review the following plan we discussed and let me know if I can assist you in the future.   Screening recommendations/referrals: Colonoscopy: pending follow up Recommended yearly ophthalmology/optometry visit for glaucoma screening and checkup Recommended yearly dental visit for hygiene and checkup  Vaccinations: Influenza vaccine: completed 03/02/2020 Pneumococcal vaccine: n/a Tdap vaccine: due Shingles vaccine: n/a   Covid-19:  05/08/2020, 10/19/2019, 09/18/2019  Advanced directives: Advance directive discussed with you today.   Conditions/risks identified: smoking  Next appointment: Follow up in one year for your annual wellness visit   Preventive Care 40-64 Years, Male Preventive care refers to lifestyle choices and visits with your health care provider that can promote health and wellness. What does preventive care include?  A yearly physical exam. This is also called an annual well check.  Dental exams once or twice a year.  Routine eye exams. Ask your health care provider how often you should have your eyes checked.  Personal lifestyle choices, including:  Daily care of your teeth and gums.  Regular physical activity.  Eating a healthy diet.  Avoiding tobacco and drug use.  Limiting alcohol use.  Practicing safe sex.  Taking low-dose aspirin every day starting at age 77. What happens during an annual well check? The services and screenings done by your health care provider during your annual well check will depend on your age, overall health, lifestyle risk factors, and family history of disease. Counseling  Your health care provider may ask you questions about your:  Alcohol use.  Tobacco use.  Drug use.  Emotional well-being.  Home and relationship well-being.  Sexual activity.  Eating habits.  Work  and work Statistician. Screening  You may have the following tests or measurements:  Height, weight, and BMI.  Blood pressure.  Lipid and cholesterol levels. These may be checked every 5 years, or more frequently if you are over 55 years old.  Skin check.  Lung cancer screening. You may have this screening every year starting at age 74 if you have a 30-pack-year history of smoking and currently smoke or have quit within the past 15 years.  Fecal occult blood test (FOBT) of the stool. You may have this test every year starting at age 50.  Flexible sigmoidoscopy or colonoscopy. You may have a sigmoidoscopy every 5 years or a colonoscopy every 10 years starting at age 46.  Prostate cancer screening. Recommendations will vary depending on your family history and other risks.  Hepatitis C blood test.  Hepatitis B blood test.  Sexually transmitted disease (STD) testing.  Diabetes screening. This is done by checking your blood sugar (glucose) after you have not eaten for a while (fasting). You may have this done every 1-3 years. Discuss your test results, treatment options, and if necessary, the need for more tests with your health care provider. Vaccines  Your health care provider may recommend certain vaccines, such as:  Influenza vaccine. This is recommended every year.  Tetanus, diphtheria, and acellular pertussis (Tdap, Td) vaccine. You may need a Td booster every 10 years.  Zoster vaccine. You may need this after age 30.  Pneumococcal 13-valent conjugate (PCV13) vaccine. You may need this if you have certain conditions and have not been vaccinated.  Pneumococcal polysaccharide (PPSV23) vaccine. You may need one or two doses if you smoke cigarettes or if you have certain conditions. Talk  to your health care provider about which screenings and vaccines you need and how often you need them. This information is not intended to replace advice given to you by your health care  provider. Make sure you discuss any questions you have with your health care provider. Document Released: 06/14/2015 Document Revised: 02/05/2016 Document Reviewed: 03/19/2015 Elsevier Interactive Patient Education  2017 [employer] Prevention in the Home Falls can cause injuries. They can happen to people of all ages. There are many things you can do to make your home safe and to help prevent falls. What can I do on the outside of my home?  Regularly fix the edges of walkways and driveways and fix any cracks.  Remove anything that might make you trip as you walk through a door, such as a raised step or threshold.  Trim any bushes or trees on the path to your home.  Use bright outdoor lighting.  Clear any walking paths of anything that might make someone trip, such as rocks or tools.  Regularly check to see if handrails are loose or broken. Make sure that both sides of any steps have handrails.  Any raised decks and porches should have guardrails on the edges.  Have any leaves, snow, or ice cleared regularly.  Use sand or salt on walking paths during winter.  Clean up any spills in your garage right away. This includes oil or grease spills. What can I do in the bathroom?  Use night lights.  Install grab bars by the toilet and in the tub and shower. Do not use towel bars as grab bars.  Use non-skid mats or decals in the tub or shower.  If you need to sit down in the shower, use a plastic, non-slip stool.  Keep the floor dry. Clean up any water that spills on the floor as soon as it happens.  Remove soap buildup in the tub or shower regularly.  Attach bath mats securely with double-sided non-slip rug tape.  Do not have throw rugs and other things on the floor that can make you trip. What can I do in the bedroom?  Use night lights.  Make sure that you have a light by your bed that is easy to reach.  Do not use any sheets or blankets that are too big for your  bed. They should not hang down onto the floor.  Have a firm chair that has side arms. You can use this for support while you get dressed.  Do not have throw rugs and other things on the floor that can make you trip. What can I do in the kitchen?  Clean up any spills right away.  Avoid walking on wet floors.  Keep items that you use a lot in easy-to-reach places.  If you need to reach something above you, use a strong step stool that has a grab bar.  Keep electrical cords out of the way.  Do not use floor polish or wax that makes floors slippery. If you must use wax, use non-skid floor wax.  Do not have throw rugs and other things on the floor that can make you trip. What can I do with my stairs?  Do not leave any items on the stairs.  Make sure that there are handrails on both sides of the stairs and use them. Fix handrails that are broken or loose. Make sure that handrails are as long as the stairways.  Check any carpeting to make sure that  it is firmly attached to the stairs. Fix any carpet that is loose or worn.  Avoid having throw rugs at the top or bottom of the stairs. If you do have throw rugs, attach them to the floor with carpet tape.  Make sure that you have a light switch at the top of the stairs and the bottom of the stairs. If you do not have them, ask someone to add them for you. What else can I do to help prevent falls?  Wear shoes that:  Do not have high heels.  Have rubber bottoms.  Are comfortable and fit you well.  Are closed at the toe. Do not wear sandals.  If you use a stepladder:  Make sure that it is fully opened. Do not climb a closed stepladder.  Make sure that both sides of the stepladder are locked into place.  Ask someone to hold it for you, if possible.  Clearly mark and make sure that you can see:  Any grab bars or handrails.  First and last steps.  Where the edge of each step is.  Use tools that help you move around (mobility  aids) if they are needed. These include:  Canes.  Walkers.  Scooters.  Crutches.  Turn on the lights when you go into a dark area. Replace any light bulbs as soon as they burn out.  Set up your furniture so you have a clear path. Avoid moving your furniture around.  If any of your floors are uneven, fix them.  If there are any pets around you, be aware of where they are.  Review your medicines with your doctor. Some medicines can make you feel dizzy. This can increase your chance of falling. Ask your doctor what other things that you can do to help prevent falls. This information is not intended to replace advice given to you by your health care provider. Make sure you discuss any questions you have with your health care provider. Document Released: 03/14/2009 Document Revised: 10/24/2015 Document Reviewed: 06/22/2014 Elsevier Interactive Patient Education  2017 [employer].

## 2020-07-03 NOTE — Patient Instructions (Signed)
Diabetes Care, 44(Suppl 1), D40-C14. https://doi.org/https://doi.org/10.2337/dc21-S003">  Prediabetes Prediabetes is when your blood sugar (blood glucose) level is higher than normal but not high enough for you to be diagnosed with type 2 diabetes. Having prediabetes puts you at risk for developing type 2 diabetes (type 2 diabetes mellitus). With certain lifestyle changes, you may be able to prevent or delay the onset of type 2 diabetes. This is important because type 2 diabetes can lead to serious complications, such as:  Heart disease.  Stroke.  Blindness.  Kidney disease.  Depression.  Poor circulation in the feet and legs. In severe cases, this could lead to surgical removal of a leg (amputation). What are the causes? The exact cause of prediabetes is not known. It may result from insulin resistance. Insulin resistance develops when cells in the body do not respond properly to insulin that the body makes. This can cause excess glucose to build up in the blood. High blood glucose (hyperglycemia) can develop. What increases the risk? The following factors may make you more likely to develop this condition:  You have a family member with type 2 diabetes.  You are older than 45 years.  You had a temporary form of diabetes during a pregnancy (gestational diabetes).  You had polycystic ovary syndrome (PCOS).  You are overweight or obese.  You are inactive (sedentary).  You have a history of heart disease, including problems with cholesterol levels, high levels of blood fats, or high blood pressure. What are the signs or symptoms? You may have no symptoms. If you do have symptoms, they may include:  Increased hunger.  Increased thirst.  Increased urination.  Vision changes, such as blurry vision.  Tiredness (fatigue). How is this diagnosed? This condition can be diagnosed with blood tests. Your blood glucose may be checked with one or more of the following tests:  A  fasting blood glucose (FBG) test. You will not be allowed to eat (you will fast) for at least 8 hours before a blood sample is taken.  An A1C blood test (hemoglobin A1C). This test provides information about blood glucose levels over the previous 2?3 months.  An oral glucose tolerance test (OGTT). This test measures your blood glucose at two points in time: ? After fasting. This is your baseline level. ? Two hours after you drink a beverage that contains glucose. You may be diagnosed with prediabetes if:  Your FBG is 100?125 mg/dL (5.6-6.9 mmol/L).  Your A1C level is 5.7?6.4% (39-46 mmol/mol).  Your OGTT result is 140?199 mg/dL (7.8-11 mmol/L). These blood tests may be repeated to confirm your diagnosis.   How is this treated? Treatment may include dietary and lifestyle changes to help lower your blood glucose and prevent type 2 diabetes from developing. In some cases, medicine may be prescribed to help lower the risk of type 2 diabetes. Follow these instructions at home: Nutrition  Follow a healthy meal plan. This includes eating lean proteins, whole grains, legumes, fresh fruits and vegetables, low-fat dairy products, and healthy fats.  Follow instructions from your health care provider about eating or drinking restrictions.  Meet with a dietitian to create a healthy eating plan that is right for you.   Lifestyle  Do moderate-intensity exercise for at least 30 minutes a day on 5 or more days each week, or as told by your health care provider. A mix of activities may be best, such as: ? Brisk walking, swimming, biking, and weight lifting.  Lose weight as told by your health  care provider. Losing 5-7% of your body weight can reverse insulin resistance.  Do not drink alcohol if: ? Your health care provider tells you not to drink. ? You are pregnant, may be pregnant, or are planning to become pregnant.  If you drink alcohol: ? Limit how much you use to:  0-1 drink a day for  women.  0-2 drinks a day for men. ? Be aware of how much alcohol is in your drink. In the [place] one drink equals one 12 oz bottle of beer (355 mL), one 5 oz glass of wine (148 mL), or one 1 oz glass of hard liquor (44 mL). General instructions  Take over-the-counter and prescription medicines only as told by your health care provider. You may be prescribed medicines that help lower the risk of type 2 diabetes.  Do not use any products that contain nicotine or tobacco, such as cigarettes, e-cigarettes, and chewing tobacco. If you need help quitting, ask your health care provider.  Keep all follow-up visits. This is important. Where to find more information  American Diabetes Association: www.diabetes.org  Academy of Nutrition and Dietetics: www.eatright.org  American Heart Association: www.heart.org Contact a health care provider if:  You have any of these symptoms: ? Increased hunger. ? Increased urination. ? Increased thirst. ? Fatigue. ? Vision changes, such as blurry vision. Get help right away if you:  Have shortness of breath.  Feel confused.  Vomit or feel like you may vomit. Summary  Prediabetes is when your blood sugar (blood glucose)level is higher than normal but not high enough for you to be diagnosed with type 2 diabetes.  Having prediabetes puts you at risk for developing type 2 diabetes (type 2 diabetes mellitus).  Make lifestyle changes such as eating a healthy diet and exercising regularly to help prevent diabetes. Lose weight as told by your health care provider. This information is not intended to replace advice given to you by your health care provider. Make sure you discuss any questions you have with your health care provider. Document Revised: 08/17/2019 Document Reviewed: 08/17/2019 Elsevier Patient Education  2021 Elsevier Inc.  

## 2020-07-23 ENCOUNTER — Ambulatory Visit: Payer: Self-pay

## 2020-07-23 ENCOUNTER — Telehealth: Payer: Self-pay

## 2020-07-23 NOTE — Telephone Encounter (Signed)
Patient called back to cancel his procedure.

## 2020-07-23 NOTE — Progress Notes (Signed)
  Attempted to reach pt for PV with call going to VM.  LM that nurse would call back to attempt PV  Attempted 2nd call for PV with call going to VM.  LM for pt to call back by 5:00 today to rsch PV and prevent cancellation of his upcoming procedure.

## 2020-07-23 NOTE — Telephone Encounter (Signed)
  Attempted to reach pt for PV with call going to VM.  LM that nurse would call back to attempt PV  Attempted 2nd call for PV with call going to VM.  LM for pt to call back by 5:00 today to rsch PV and prevent cancellation of his upcoming procedure.

## 2020-08-06 ENCOUNTER — Encounter: Payer: Medicare Other | Admitting: Gastroenterology

## 2020-08-12 ENCOUNTER — Ambulatory Visit (INDEPENDENT_AMBULATORY_CARE_PROVIDER_SITE_OTHER)
Admission: EM | Admit: 2020-08-12 | Discharge: 2020-08-12 | Disposition: A | Discharge status: Home / Self Care | Payer: Medicare (Managed Care) | Source: Home / Self Care

## 2020-08-12 ENCOUNTER — Other Ambulatory Visit: Payer: Self-pay

## 2020-08-12 ENCOUNTER — Ambulatory Visit (INDEPENDENT_AMBULATORY_CARE_PROVIDER_SITE_OTHER): Payer: Medicare (Managed Care)

## 2020-08-12 ENCOUNTER — Encounter (HOSPITAL_COMMUNITY): Payer: Self-pay

## 2020-08-12 ENCOUNTER — Encounter (HOSPITAL_COMMUNITY): Payer: Self-pay | Admitting: Emergency Medicine

## 2020-08-12 ENCOUNTER — Emergency Department (HOSPITAL_COMMUNITY)
Admission: EM | Admit: 2020-08-12 | Discharge: 2020-08-13 | Disposition: A | Discharge status: Home / Self Care | Payer: Medicare (Managed Care) | Attending: Emergency Medicine | Admitting: Emergency Medicine

## 2020-08-12 ENCOUNTER — Emergency Department (HOSPITAL_COMMUNITY): Payer: Medicare (Managed Care)

## 2020-08-12 DIAGNOSIS — R0602 Shortness of breath: Secondary | ICD-10-CM | POA: Insufficient documentation

## 2020-08-12 DIAGNOSIS — F17200 Nicotine dependence, unspecified, uncomplicated: Secondary | ICD-10-CM

## 2020-08-12 DIAGNOSIS — J90 Pleural effusion, not elsewhere classified: Secondary | ICD-10-CM | POA: Insufficient documentation

## 2020-08-12 DIAGNOSIS — R0789 Other chest pain: Secondary | ICD-10-CM

## 2020-08-12 DIAGNOSIS — F17290 Nicotine dependence, other tobacco product, uncomplicated: Secondary | ICD-10-CM | POA: Insufficient documentation

## 2020-08-12 DIAGNOSIS — Z20822 Contact with and (suspected) exposure to covid-19: Secondary | ICD-10-CM | POA: Insufficient documentation

## 2020-08-12 DIAGNOSIS — R079 Chest pain, unspecified: Secondary | ICD-10-CM

## 2020-08-12 DIAGNOSIS — R7303 Prediabetes: Secondary | ICD-10-CM

## 2020-08-12 DIAGNOSIS — R059 Cough, unspecified: Secondary | ICD-10-CM

## 2020-08-12 DIAGNOSIS — Z79899 Other long term (current) drug therapy: Secondary | ICD-10-CM | POA: Insufficient documentation

## 2020-08-12 LAB — CBC
HCT: 41.2 % (ref 39.0–52.0)
Hemoglobin: 14.1 g/dL (ref 13.0–17.0)
MCH: 30.1 pg (ref 26.0–34.0)
MCHC: 34.2 g/dL (ref 30.0–36.0)
MCV: 87.8 fL (ref 80.0–100.0)
Platelets: 170 10*3/uL (ref 150–400)
RBC: 4.69 MIL/uL (ref 4.22–5.81)
RDW: 14.7 % (ref 11.5–15.5)
WBC: 5.3 10*3/uL (ref 4.0–10.5)
nRBC: 0 % (ref 0.0–0.2)

## 2020-08-12 LAB — BASIC METABOLIC PANEL
Anion gap: 8 (ref 5–15)
BUN: 11 mg/dL (ref 6–20)
CO2: 25 mmol/L (ref 22–32)
Calcium: 9.3 mg/dL (ref 8.9–10.3)
Chloride: 102 mmol/L (ref 98–111)
Creatinine, Ser: 1.03 mg/dL (ref 0.61–1.24)
GFR, Estimated: >60 mL/min (ref >60)
Glucose, Bld: 104 mg/dL — ABNORMAL HIGH (ref 70–99)
Potassium: 4.1 mmol/L (ref 3.5–5.1)
Sodium: 135 mmol/L (ref 135–145)

## 2020-08-12 LAB — TROPONIN I (HIGH SENSITIVITY): Troponin I (High Sensitivity): 4 ng/L (ref <18)

## 2020-08-12 LAB — PROTIME-INR
INR: 1.1 (ref 0.8–1.2)
Prothrombin Time: 13.7 seconds (ref 11.4–15.2)

## 2020-08-12 NOTE — Discharge Instructions (Addendum)
Please report to the emergency room as I am concerned about your abnormal ecg, pleural effusion and ongoing chest pain. This requires a higher level of evaluation and care than we can provide in the urgent care setting and will be best served at the hospital.

## 2020-08-12 NOTE — ED Triage Notes (Signed)
Pt presents with central chest tightness with shortness of breath and headache since earlier today; pt states he has Hx of anxiety.

## 2020-08-12 NOTE — ED Triage Notes (Signed)
Patient reports chest tightness across his chest for 1 month with mild SOB , no emesis or diaphoresis , denies cough or fever .

## 2020-08-12 NOTE — ED Provider Notes (Signed)
[facility]   MRN: [id number] DOB: [birth year]  Subjective:   Scott Farrell is a 40 y.o. male presenting for 1 month history of intermittent midsternal chest pain, shortness of breath, coughing.  Today he started to have a headache as well.  Last episode of chest pain he had started this morning.  Has fluctuated between a 10 out of 10 to a current 4 out of 10.  Reports that his tightness gripping sensation in his chest.  Has not taken medications for it.  Has had COVID vaccination, booster shot.  He is a smoker, does about 1/2 pack/day.  Has previously had cardiac work-up including an echocardiogram and cardiac stress test that turned out to be normal.  Does not have a cardiologist. Does not get regular care.   No current facility-administered medications for this encounter.  Current Outpatient Medications:  .  divalproex (DEPAKOTE ER) 250 MG 24 hr tablet, Take 250 mg by mouth daily. , Disp: , Rfl:  .  metFORMIN (GLUCOPHAGE XR) 500 MG 24 hr tablet, Take 1 tablet (500 mg total) by mouth daily with breakfast., Disp: 90 tablet, Rfl: 1 .  risperiDONE (RISPERDAL) 2 MG tablet, Take 2 mg by mouth 2 (two) times daily., Disp: , Rfl:    No Known Allergies  Past Medical History:  Diagnosis Date  . Clotting disorder ([place])    personal hx/o? etiology unclear  . Costochondritis    history of  . 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 History of echocardiogram 01/07/11   normal LV function, EF 60%, trace mitral, tricuspid and pulmonic regurgitation; Dr. Tollie Eth  . IBS (irritable bowel syndrome)   . Mental disorder 2013   hospitalized for psychiatric illness prior  . Normal cardiac stress test 01/07/11   negative treadmill test, no evidence of ischemia, good exercise capacity; Tollie Eth, MD;   . Prostatitis 11/2010   Dr. Karsten Ro  . Renal stone 06/2012   Urology consult  . Schizophrenia [facility])    [facility], [place] - Dr. Lennon Alstrom  . Tobacco use disorder      Past  Surgical History:  Procedure Laterality Date  . EUS  06/30/2012   Procedure: UPPER ENDOSCOPIC ULTRASOUND (EUS) LINEAR;  Surgeon: Milus Banister, MD;  Location: [facility] ENDOSCOPY;  Service: Endoscopy;  Laterality: N/A;  radial linear  . WISDOM TOOTH EXTRACTION      Family History  Problem Relation Age of Onset  . Hypertension Mother   . Stroke Mother   . Depression Mother   . Acne Mother   . Thyroid disease Mother   . Diabetes Mother   . Colon cancer Mother 79  . Acute lymphoblastic leukemia Father   . Diabetes Father   . Cancer Brother   . Breast cancer Maternal Aunt   . Ovarian cancer Maternal Aunt     Social History   Tobacco Use  . Smoking status: Current Some Day Smoker    Packs/day: 1.00    Years: 23.00    Pack years: 23.00    Types: Cigars  . Smokeless tobacco: Never Used  . Tobacco comment: 1-2 black milds daily  Vaping Use  . Vaping Use: Former  . Substances: Nicotine  Substance Use Topics  . Alcohol use: Yes    Comment: occ  . Drug use: Not Currently    ROS   Objective:   Vitals: BP 119/69 (BP Location: Left Arm)   Pulse 62   Temp 98.3 F (36.8 C) (Oral)  Resp 20   SpO2 100%   Physical Exam Constitutional:      General: He is not in acute distress.    Appearance: Normal appearance. He is well-developed. He is not ill-appearing, toxic-appearing or diaphoretic.  HENT:     Head: Normocephalic and atraumatic.     Right Ear: External ear normal.     Left Ear: External ear normal.     Nose: Nose normal.     Mouth/Throat:     Mouth: Mucous membranes are moist.     Pharynx: Oropharynx is clear.  Eyes:     General: No scleral icterus.       Right eye: No discharge.        Left eye: No discharge.     Extraocular Movements: Extraocular movements intact.     Conjunctiva/sclera: Conjunctivae normal.     Pupils: Pupils are equal, round, and reactive to light.  Cardiovascular:     Rate and Rhythm: Normal rate and regular rhythm.     Heart sounds:  Normal heart sounds. No murmur heard. No friction rub. No gallop.   Pulmonary:     Effort: Pulmonary effort is normal. No respiratory distress.     Breath sounds: Normal breath sounds. No stridor. No wheezing, rhonchi or rales.  Neurological:     Mental Status: He is alert and oriented to person, place, and time.  Psychiatric:        Mood and Affect: Mood normal.        Behavior: Behavior normal.        Thought Content: Thought content normal.        Judgment: Judgment normal.    DG Chest 2 View  Result Date: 08/12/2020 CLINICAL DATA:  Cough and chest pain EXAM: CHEST - 2 VIEW COMPARISON:  09/23/2012, 07/12/2015 FINDINGS: Tiny right pleural effusion or pleural thickening. No consolidation. Stable cardiomediastinal silhouette. No pneumothorax IMPRESSION: Tiny right pleural effusion or pleural thickening. Electronically Signed   By: Donavan Foil M.D.   On: 08/12/2020 20:08    ED ECG REPORT   Date: 08/12/2020  Rate: 65bpm  Rhythm: normal sinus rhythm  QRS Axis: normal  Intervals: normal  ST/T Wave abnormalities: ST elevations anteriorly  Conduction Disutrbances:none  Narrative Interpretation: Sinus rhythm at 65 bpm with ST elevations in V3, V4 and V5.  Very comparable to previous EKG.  Old EKG Reviewed: very comparable to previous ecg as it also showed ST elevation over same leads, suggestion of pericarditis and early repolarization  I have personally reviewed the EKG tracing and agree with the computerized printout as noted.   Assessment and Plan :   PDMP not reviewed this encounter.  1. Mid sternal chest pain   2. Cough   3. Shortness of breath   4. Smoker   5. Prediabetes   6. Pleural effusion     Patient has moderate to severe chest pain, an abnormal EKG with ST elevation in the new onset pleural effusion of the right lower lung.  Discussed this with patient and recommended evaluation in the emergency room.  I did place an urgent referral to cardiology.  Patient is in  agreement with treatment plan and will have to the emergency room now.  We both discussed transportation to the ER by ambulance but patient declined ultimately and I am in agreement given stable vital signs.   Jaynee Eagles, [place] 08/12/20 2028

## 2020-08-13 LAB — TROPONIN I (HIGH SENSITIVITY): Troponin I (High Sensitivity): 6 ng/L (ref <18)

## 2020-08-13 LAB — SARS CORONAVIRUS 2 (TAT 6-24 HRS): SARS Coronavirus 2: NEGATIVE

## 2020-08-13 NOTE — ED Notes (Signed)
Patient verbalizes understanding of discharge instructions. Opportunity for questioning and answers were provided. Armband removed by staff, pt discharged from ED ambulatory.   

## 2020-08-13 NOTE — ED Notes (Signed)
Provider at bedside

## 2020-08-13 NOTE — Discharge Instructions (Addendum)
Your repeat heart marker was normal.  It is unclear what caused your chest tightness tonight.  Please follow-up with your doctor.  I also recommend that you follow-up with the cardiologist.  If you have worsening symptoms, or if your shortness of breath or chest tightness returns, please return to the emergency department.

## 2020-08-13 NOTE — ED Provider Notes (Signed)
[facility] EMERGENCY DEPARTMENT Provider Note   CSN: [id number] Arrival date & time: 08/12/20  2040     History Chief Complaint  Patient presents with  . Chest Pain    Scott Farrell is a 40 y.o. male.  Patient presents to the emergency department with a chief complaint of chest tightness.  He states that his symptoms have now resolved.  States that the symptoms started while he was at work.  States that they have been coming and going for the past month or so, but significantly worsened today while at work.  He denies any fever, chills, or productive cough.  Denies any treatments prior to arrival.  Martin Majestic to urgent care, and was sent to the emergency department for evaluation.  The history is provided by the patient. No language interpreter was used.       Past Medical History:  Diagnosis Date  . Clotting disorder ([place])    personal hx/o? etiology unclear  . Costochondritis    history of  . 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 History of echocardiogram 01/07/11   normal LV function, EF 60%, trace mitral, tricuspid and pulmonic regurgitation; Dr. Tollie Eth  . IBS (irritable bowel syndrome)   . Mental disorder 2013   hospitalized for psychiatric illness prior  . Normal cardiac stress test 01/07/11   negative treadmill test, no evidence of ischemia, good exercise capacity; Tollie Eth, MD;   . Prostatitis 11/2010   Dr. Karsten Ro  . Renal stone 06/2012   Urology consult  . Schizophrenia [facility])    [facility], [place] - Dr. Lennon Alstrom  . Tobacco use disorder     Patient Active Problem List   Diagnosis Date Noted  . Other headache syndrome 07/31/2019  . 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 Lightheaded 07/31/2019  . Callus of foot 01/27/2018  . Unspecified constipation 04/27/2012  . Family history of malignant neoplasm of gastrointestinal tract 04/27/2012  . 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 EUS  06/30/2012    Procedure: UPPER ENDOSCOPIC ULTRASOUND (EUS) LINEAR;  Surgeon: Milus Banister, MD;  Location: [facility] ENDOSCOPY;  Service: Endoscopy;  Laterality: N/A;  radial linear  . WISDOM TOOTH EXTRACTION         Family History  Problem Relation Age of Onset  . Hypertension Mother   . Stroke Mother   . Depression Mother   . Acne Mother   . Thyroid disease Mother   . Diabetes Mother   . Colon cancer Mother 78  . Acute lymphoblastic leukemia Father   . Diabetes Father   . Cancer Brother   . Breast cancer Maternal Aunt   . Ovarian cancer Maternal Aunt     Social History   Tobacco Use  . Smoking status: Current Some Day Smoker    Packs/day: 1.00    Years: 23.00    Pack years: 23.00    Types: Cigars  . Smokeless tobacco: Never Used  . Tobacco comment: 1-2 black milds daily  Vaping Use  . Vaping Use: Former  . Substances: Nicotine  Substance Use Topics  . Alcohol use: Yes    Comment: occ  . Drug use: Not Currently    Home Medications Prior to Admission medications   Medication Sig Start Date End Date Taking? Authorizing Provider  divalproex (DEPAKOTE ER) 250 MG 24 hr tablet Take 250 mg by mouth daily.     [provider]  metFORMIN (GLUCOPHAGE XR) 500 MG  24 hr tablet Take 1 tablet (500 mg total) by mouth daily with breakfast. 07/03/20 07/03/21  [name], FNP  risperiDONE (RISPERDAL) 2 MG tablet Take 2 mg by mouth 2 (two) times daily.    [provider]    Allergies    Patient has no known allergies.  Review of Systems   Review of Systems  All other systems reviewed and are negative.   Physical Exam Updated Vital Signs BP 131/80   Pulse (!) 54   Temp 98.2 F (36.8 C) (Oral)   Resp (!) 31   Ht 6\' 3"  (1.905 m)   Wt 122 kg   SpO2 98%   BMI 33.62 kg/m   Physical Exam Vitals and nursing note reviewed.  Constitutional:      Appearance: He is well-developed.  HENT:     Head: Normocephalic and atraumatic.  Eyes:     Conjunctiva/sclera: Conjunctivae  normal.  Cardiovascular:     Rate and Rhythm: Normal rate and regular rhythm.     Heart sounds: No murmur heard.   Pulmonary:     Effort: Pulmonary effort is normal. No respiratory distress.     Breath sounds: Normal breath sounds.  Abdominal:     Palpations: Abdomen is soft.     Tenderness: There is no abdominal tenderness.  Musculoskeletal:        General: Normal range of motion.     Cervical back: Neck supple.  Skin:    General: Skin is warm and dry.  Neurological:     Mental Status: He is alert and oriented to person, place, and time.  Psychiatric:        Mood and Affect: Mood normal.     ED Results / Procedures / Treatments   Labs (all labs ordered are listed, but only abnormal results are displayed) Labs Reviewed  BASIC METABOLIC PANEL - Abnormal; Notable for the following components:      Result Value   Glucose, Bld 104 (*)    All other components within normal limits  CBC  PROTIME-INR  TROPONIN I (HIGH SENSITIVITY)  TROPONIN I (HIGH SENSITIVITY)    EKG None  Radiology DG Chest 2 View  Result Date: 08/12/2020 CLINICAL DATA:  Chest pain EXAM: CHEST - 2 VIEW COMPARISON:  08/12/2020, 07/12/2015 FINDINGS: The heart size and mediastinal contours are within normal limits. Both lungs are clear. The visualized skeletal structures are unremarkable. IMPRESSION: No active cardiopulmonary disease. Electronically Signed   By: Donavan Foil M.D.   On: 08/12/2020 21:24   DG Chest 2 View  Result Date: 08/12/2020 CLINICAL DATA:  Cough and chest pain EXAM: CHEST - 2 VIEW COMPARISON:  09/23/2012, 07/12/2015 FINDINGS: Tiny right pleural effusion or pleural thickening. No consolidation. Stable cardiomediastinal silhouette. No pneumothorax IMPRESSION: Tiny right pleural effusion or pleural thickening. Electronically Signed   By: Donavan Foil M.D.   On: 08/12/2020 20:08    Procedures Procedures   Medications Ordered in ED Medications - No data to display  ED Course  I have  reviewed the triage vital signs and the nursing notes.  Pertinent labs & imaging results that were available during my care of the patient were reviewed by me and considered in my medical decision making (see chart for details).    MDM Rules/Calculators/A&P                          This patient complains of chest tightness that started at work earlier [employer],  this involves an extensive number of treatment options, and is a complaint that carries with it a high risk of complications and morbidity.    Patient states that his symptoms are significantly improved.  He does report feeling like he is under a lot of stress at work.  He has had panic attacks before.  Differential Dx Anxiety, ACS, PE, pneumonia, Covid  Pertinent Labs I ordered, reviewed, and interpreted labs, which included CBC, BMP, troponins are reassuring.  Imaging Interpretation I ordered imaging studies which included chest x-ray.  I independently visualized and interpreted the chest x-ray, which showed no obvious abnormality.   Reassessments After the interventions stated above, I reevaluated the patient and found improved, no longer having any symptoms.  Consultants None  Plan Discharge with outpatient follow-up.  ACS thought less likely given repeat troponin is negative and no acute ischemic changes on EKG.  Patient is not hypoxic, nor is he tachycardic.  He states that he has been under a lot of stress, question anxiety.  Will recommend follow-up with cardiology and PCP.    Final Clinical Impression(s) / ED Diagnoses Final diagnoses:  Chest tightness    Rx / DC Orders ED Discharge Orders    None       Montine Circle, PA-C 08/13/20 0149    Merryl Hacker, MD 08/13/20 [phone]

## 2020-08-14 ENCOUNTER — Telehealth: Payer: Self-pay

## 2020-08-14 NOTE — Telephone Encounter (Signed)
Per JM: Please schedule an ER follow up visit  LVM for pt to call the office back for an appt

## 2020-09-03 ENCOUNTER — Encounter: Payer: Self-pay | Admitting: Nurse Practitioner

## 2020-09-03 ENCOUNTER — Other Ambulatory Visit: Payer: Self-pay

## 2020-09-03 ENCOUNTER — Ambulatory Visit: Payer: Medicare (Managed Care) | Admitting: Nurse Practitioner

## 2020-09-03 VITALS — BP 114/72 | HR 78 | Temp 98.2°F | Ht 75.0 in | Wt 246.4 lb

## 2020-09-03 DIAGNOSIS — R7303 Prediabetes: Secondary | ICD-10-CM

## 2020-09-03 DIAGNOSIS — E781 Pure hyperglyceridemia: Secondary | ICD-10-CM

## 2020-09-03 DIAGNOSIS — Z720 Tobacco use: Secondary | ICD-10-CM | POA: Diagnosis not present

## 2020-09-03 DIAGNOSIS — Z6830 Body mass index (BMI) 30.0-30.9, adult: Secondary | ICD-10-CM

## 2020-09-03 DIAGNOSIS — H6122 Impacted cerumen, left ear: Secondary | ICD-10-CM

## 2020-09-03 MED ORDER — NICOTINE 21 MG/24HR TD PT24
21.0000 mg | MEDICATED_PATCH | TRANSDERMAL | 1 refills | Status: DC
Start: 2020-09-03 — End: 2020-12-16

## 2020-09-03 NOTE — Progress Notes (Signed)
I,Scott Farrell,acting as a Education administrator for [employer], FNP.,have documented all relevant documentation on the behalf of Scott Brine, FNP,as directed by  Scott Brine, FNP while in the presence of Scott Farrell, [place].  This visit occurred during the SARS-CoV-2 public health emergency.  Safety protocols were in place, including screening questions prior to the visit, additional usage of staff PPE, and extensive cleaning of exam room while observing appropriate contact time as indicated for disinfecting solutions.  Subjective:     Patient ID: Scott Farrell , male    DOB: [birth year] , 40 y.o.   MRN: [id number]   Chief Complaint  Patient presents with  . Hypertension  . Diabetes    HPI  The patient is here today for a follow-up on prediabetes, the pt states the metformin was causing chest tightness so he stopped the medication.  The patient would also like a medication to help stop smoking.  He reports he feels like his left ear needs to be cleaned, having popping   Diabetes He presents for his follow-up diabetic visit. Diabetes type: prediabetes. Hypoglycemia symptoms include headaches. Pertinent negatives for hypoglycemia include no dizziness. There are no diabetic associated symptoms. Pertinent negatives for diabetes include no fatigue. There are no hypoglycemic complications. Symptoms are stable. There are no diabetic complications. Risk factors for coronary artery disease include obesity and sedentary lifestyle. Current diabetic treatment includes diet. He is following a generally unhealthy diet. When asked about meal planning, he reported none. He has not had a previous visit with a dietitian. He rarely participates in exercise. (Does not check blood sugars)  Headache  This is a new problem. The current episode started more than 1 month ago. The pain does not radiate. The pain quality is similar to prior headaches. The quality of the pain is described as aching. The pain is mild.  Pertinent negatives include no abdominal pain, dizziness or numbness. Nothing aggravates the symptoms. He has tried acetaminophen for the symptoms.     Past Medical History:  Diagnosis Date  . Clotting disorder ([place])    personal hx/o? etiology unclear  . Costochondritis    history of  . 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 History of echocardiogram 01/07/11   normal LV function, EF 60%, trace mitral, tricuspid and pulmonic regurgitation; Dr. Tollie Eth  . IBS (irritable bowel syndrome)   . Mental disorder 2013   hospitalized for psychiatric illness prior  . Normal cardiac stress test 01/07/11   negative treadmill test, no evidence of ischemia, good exercise capacity; Tollie Eth, MD;   . Prostatitis 11/2010   Dr. Karsten Ro  . Renal stone 06/2012   Urology consult  . Schizophrenia [facility])    [facility], [place] - Dr. Lennon Alstrom  . Tobacco use disorder      Family History  Problem Relation Age of Onset  . Hypertension Mother   . Stroke Mother   . Depression Mother   . Acne Mother   . Thyroid disease Mother   . Diabetes Mother   . Colon cancer Mother 55  . Acute lymphoblastic leukemia Father   . Diabetes Father   . Cancer Brother   . Breast cancer Maternal Aunt   . Ovarian cancer Maternal Aunt      Current Outpatient Medications:  .  divalproex (DEPAKOTE ER) 250 MG 24 hr tablet, Take 250 mg by mouth daily. , Disp: , Rfl:  .  divalproex (DEPAKOTE) 500 MG DR tablet, Take 500 mg by mouth at bedtime., Disp: ,  Rfl:  .  nicotine (NICODERM CQ - DOSED IN MG/24 HOURS) 21 mg/24hr patch, Place 1 patch (21 mg total) onto the skin daily., Disp: 30 patch, Rfl: 1 .  risperiDONE (RISPERDAL) 3 MG tablet, Take 3 mg by mouth 2 (two) times daily., Disp: , Rfl:    Allergies  Allergen Reactions  . Metformin And Related Other (See Comments)    Chest tightness     Review of Systems  Constitutional: Negative.  Negative for fatigue.  Respiratory: Negative.   Cardiovascular: Negative.    Gastrointestinal: Negative.  Negative for abdominal pain.  Neurological: Positive for headaches. Negative for dizziness and numbness.  Psychiatric/Behavioral: Negative.   All other systems reviewed and are negative.    Today's Vitals   09/03/20 1504  BP: 114/72  Pulse: 78  Temp: 98.2 F (36.8 C)  TempSrc: Oral  Weight: 246 lb 6.4 oz (111.8 kg)  Height: _0  (1.905 m)   Body mass index is 30.8 kg/m.  Wt Readings from Last 3 Encounters:  09/03/20 246 lb 6.4 oz (111.8 kg)  08/12/20 268 lb 15.4 oz (122 kg)  07/03/20 235 lb (106.6 kg)   Objective:  Physical Exam Vitals reviewed.  Constitutional:      General: He is not in acute distress.    Appearance: Normal appearance. He is obese.  HENT:     Head: Normocephalic.     Right Ear: Tympanic membrane, ear canal and external ear normal. There is no impacted cerumen.     Left Ear: External ear normal. There is impacted cerumen.  Cardiovascular:     Rate and Rhythm: Normal rate and regular rhythm.     Pulses: Normal pulses.     Heart sounds: Normal heart sounds. No murmur heard.   Pulmonary:     Effort: Pulmonary effort is normal. No respiratory distress.     Breath sounds: Normal breath sounds. No wheezing.  Skin:    General: Skin is warm and dry.     Capillary Refill: Capillary refill takes less than 2 seconds.  Neurological:     General: No focal deficit present.     Mental Status: He is alert and oriented to person, place, and time.     Cranial Nerves: No cranial nerve deficit.     Motor: No weakness.  Psychiatric:        Mood and Affect: Mood normal.        Behavior: Behavior normal.        Thought Content: Thought content normal.        Judgment: Judgment normal.         Assessment And Plan:     1. Prediabetes  Chronic, controlled  Continue with current medications  Encouraged to limit intake of sugary foods and drinks  Encouraged to increase physical activity to 150 minutes per week as tolerated -  BMP8+EGFR - Hemoglobin A1c  2. High triglycerides  This was elevated at last visit, he is advised to limit intake of breads and sugars  Will check lipids  3. Impacted cerumen of left ear  Water lavage done  - EAR CERUMEN REMOVAL  4. Tobacco abuse  Smoking cessation instruction/counseling given:  counseled patient on the dangers of tobacco use, advised patient to stop smoking, and reviewed strategies to maximize success   He is ready to quit smoking, nicotine patches given - nicotine (NICODERM CQ - DOSED IN MG/24 HOURS) 21 mg/24hr patch; Place 1 patch (21 mg total) onto the skin daily.  Dispense:  30 patch; Refill: 1  5. BMI 30.0-30.9,adult  He is encouraged to initially strive for BMI less than 30 to decrease cardiac risk. He is advised to exercise no less than 150 minutes per week.    Patient was given opportunity to ask questions. Patient verbalized understanding of the plan and was able to repeat key elements of the plan. All questions were answered to their satisfaction.  Scott Brine, FNP   I, Scott Brine, FNP, have reviewed all documentation for this visit. The documentation on 09/03/20 for the exam, diagnosis, procedures, and orders are all accurate and complete.   IF YOU HAVE BEEN REFERRED TO A SPECIALIST, IT MAY TAKE 1-2 WEEKS TO SCHEDULE/PROCESS THE REFERRAL. IF YOU HAVE NOT HEARD FROM US/SPECIALIST IN TWO WEEKS, PLEASE GIVE [place] A CALL AT [phone].   THE PATIENT IS ENCOURAGED TO PRACTICE SOCIAL DISTANCING DUE TO THE COVID-19 PANDEMIC.

## 2020-09-03 NOTE — Patient Instructions (Signed)
Diabetes Mellitus and Exercise Exercising regularly is important for overall health, especially for people who have diabetes mellitus. Exercising is not only about losing weight. It has many other health benefits, such as increasing muscle strength and bone density and reducing body fat and stress. This leads to improved fitness, flexibility, and endurance, all of which result in better overall health. What are the benefits of exercise if I have diabetes? Exercise has many benefits for people with diabetes. They include:  Helping to lower and control blood sugar (glucose).  Helping the body to respond better to the hormone insulin by improving insulin sensitivity.  Reducing how much insulin the body needs.  Lowering the risk for heart disease by: ? Lowering "bad" cholesterol and triglyceride levels. ? Increasing "good" cholesterol levels. ? Lowering blood pressure. ? Lowering blood glucose levels. What is my activity plan? Your health care provider or certified diabetes educator can help you make a plan for the type and frequency of exercise that works for you. This is called your activity plan. Be sure to:  Get at least 150 minutes of medium-intensity or high-intensity exercise each week. Exercises may include brisk walking, biking, or water aerobics.  Do stretching and strengthening exercises, such as yoga or weight lifting, at least 2 times a week.  Spread out your activity over at least 3 days of the week.  Get some form of physical activity each day. ? Do not go more than 2 days in a row without some kind of physical activity. ? Avoid being inactive for more than 90 minutes at a time. Take frequent breaks to walk or stretch.  Choose exercises or activities that you enjoy. Set realistic goals.  Start slowly and gradually increase your exercise intensity over time.   How do I manage my diabetes during exercise? Monitor your blood glucose  Check your blood glucose before and  after exercising. If your blood glucose is: ? 240 mg/dL (13.3 mmol/L) or higher before you exercise, check your urine for ketones. These are chemicals created by the liver. If you have ketones in your urine, do not exercise until your blood glucose returns to normal. ? 100 mg/dL (5.6 mmol/L) or lower, eat a snack containing 15-20 grams of carbohydrate. Check your blood glucose 15 minutes after the snack to make sure that your glucose level is above 100 mg/dL (5.6 mmol/L) before you start your exercise.  Know the symptoms of low blood glucose (hypoglycemia) and how to treat it. Your risk for hypoglycemia increases during and after exercise. Follow these tips and your health care provider's instructions  Keep a carbohydrate snack that is fast-acting for use before, during, and after exercise to help prevent or treat hypoglycemia.  Avoid injecting insulin into areas of the body that are going to be exercised. For example, avoid injecting insulin into: ? Your arms, when you are about to play tennis. ? Your legs, when you are about to go jogging.  Keep records of your exercise habits. Doing this can help you and your health care provider adjust your diabetes management plan as needed. Write down: ? Food that you eat before and after you exercise. ? Blood glucose levels before and after you exercise. ? The type and amount of exercise you have done.  Work with your health care provider when you start a new exercise or activity. He or she may need to: ? Make sure that the activity is safe for you. ? Adjust your insulin, other medicines, and food that   you eat.  Drink plenty of water while you exercise. This prevents loss of water (dehydration) and problems caused by a lot of heat in the body (heat stroke).   Where to find more information  American Diabetes Association: www.diabetes.org Summary  Exercising regularly is important for overall health, especially for people who have diabetes  mellitus.  Exercising has many health benefits. It increases muscle strength and bone density and reduces body fat and stress. It also lowers and controls blood glucose.  Your health care provider or certified diabetes educator can help you make an activity plan for the type and frequency of exercise that works for you.  Work with your health care provider to make sure any new activity is safe for you. Also work with your health care provider to adjust your insulin, other medicines, and the food you eat. This information is not intended to replace advice given to you by your health care provider. Make sure you discuss any questions you have with your health care provider. Document Revised: 02/13/2019 Document Reviewed: 02/13/2019 Elsevier Patient Education  2021 Elsevier Inc.  

## 2020-09-04 LAB — HEMOGLOBIN A1C
Est. average glucose Bld gHb Est-mCnc: 117 mg/dL
Hgb A1c MFr Bld: 5.7 % — ABNORMAL HIGH (ref 4.8–5.6)

## 2020-09-04 LAB — BMP8+EGFR
BUN/Creatinine Ratio: 12 (ref 9–20)
BUN: 12 mg/dL (ref 6–24)
CO2: 21 mmol/L (ref 20–29)
Calcium: 9.9 mg/dL (ref 8.7–10.2)
Chloride: 103 mmol/L (ref 96–106)
Creatinine, Ser: 1.04 mg/dL (ref 0.76–1.27)
Glucose: 89 mg/dL (ref 65–99)
Potassium: 4.7 mmol/L (ref 3.5–5.2)
Sodium: 141 mmol/L (ref 134–144)
eGFR: 93 mL/min/{1.73_m2} (ref >59)

## 2020-09-13 DIAGNOSIS — R7303 Prediabetes: Secondary | ICD-10-CM | POA: Insufficient documentation

## 2020-09-13 DIAGNOSIS — E781 Pure hyperglyceridemia: Secondary | ICD-10-CM | POA: Insufficient documentation

## 2020-09-18 NOTE — Progress Notes (Signed)
Cardiology Office Note:    Date:  09/19/2020   ID:  Scott Farrell, DOB August 02, 1980, MRN [id number]  PCP:  [name], [place]  Cardiologist:  No primary care provider on file.  Advanced Practice Provider:  No care team member to display Electrophysiologist:  None       CC: Chest Pain  Consulted for the evaluation of chest pain at the behest of [name], FNP  History of Present Illness:    Scott Farrell is a 40 y.o. male with a hx of pre-diabetes, tobacco abuse who presents for evaluation 09/19/20.  Patient notes that he is feeling chest heaviness.    ED evaluation 3/14 -3/15 for chest tightness.  Normal novel troponin testing.  Patient notes chest heaviness and tightness- like someone was reaching into his heart and squeezing it.  Occurs with stocking things, and improves with rest and walking.  Patient exertion notable for walking  and feels no symptoms.  No shortness of breath, DOE.  No PND or orthopnea.  No bendopnea, weight gain, leg swelling , or abdominal swelling.  No syncope or near syncope. Notes no palpitations or funny heart beats.   Notes discoloration in his feet that has been going on for some time.   Past Medical History:  Diagnosis Date  . Clotting disorder ([place])    personal hx/o? etiology unclear  . Costochondritis    history of  . 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 History of echocardiogram 01/07/11   normal LV function, EF 60%, trace mitral, tricuspid and pulmonic regurgitation; Dr. Tollie Eth  . IBS (irritable bowel syndrome)   . Mental disorder 2013   hospitalized for psychiatric illness prior  . Normal cardiac stress test 01/07/11   negative treadmill test, no evidence of ischemia, good exercise capacity; Tollie Eth, MD;   . Prostatitis 11/2010   Dr. Karsten Ro  . Renal stone 06/2012   Urology consult  . Schizophrenia [facility])    [facility], [place] - Dr. Lennon Alstrom  . Tobacco use disorder     Past  Surgical History:  Procedure Laterality Date  . EUS  06/30/2012   Procedure: UPPER ENDOSCOPIC ULTRASOUND (EUS) LINEAR;  Surgeon: Milus Banister, MD;  Location: [facility] ENDOSCOPY;  Service: Endoscopy;  Laterality: N/A;  radial linear  . WISDOM TOOTH EXTRACTION      Current Medications: Current Meds  Medication Sig  . aspirin-acetaminophen-caffeine (EXCEDRIN MIGRAINE) 250-250-65 MG tablet Take by mouth as needed for headache or migraine.  . divalproex (DEPAKOTE) 500 MG DR tablet Take 500 mg by mouth at bedtime.  Marland Kitchen esomeprazole (NEXIUM) 40 MG capsule daily in the afternoon.  . nicotine (NICODERM CQ - DOSED IN MG/24 HOURS) 21 mg/24hr patch Place 1 patch (21 mg total) onto the skin daily.  . risperiDONE (RISPERDAL) 3 MG tablet Take 3 mg by mouth 2 (two) times daily.     Allergies:   Metformin and related   Social History   Socioeconomic History  . Marital status: Single    Spouse name: Not on file  . Number of children: Not on file  . Years of education: Not on file  . Highest education level: Not on file  Occupational History  . Occupation: [employer]  Tobacco Use  . Smoking status: Current Some Day Smoker    Packs/day: 0.25    Years: 23.00    Pack years: 5.75    Types: Cigars, Cigarettes  . Smokeless tobacco: Never Used  .  Tobacco comment: 1-2 black milds daily; started back smoking cigarettes.   Vaping Use  . Vaping Use: Former  . Substances: Nicotine  Substance and Sexual Activity  . Alcohol use: Yes    Comment: occ  . Drug use: Not Currently  . Sexual activity: Not Currently    Partners: Female  Other Topics Concern  . Not on file  Social History Narrative   Lives with mother, Scott Farrell, exercising with walking, running, no current relationship; unemployed   Caffeine use: 1 cup per day   Right handed   Social Determinants of Health   Financial Resource Strain: Low Risk   . Difficulty of Paying Living Expenses: Not hard at all  Food Insecurity: No Food Insecurity  .  Worried About Charity fundraiser in the Last Year: Never true  . Ran Out of Food in the Last Year: Never true  Transportation Needs: No Transportation Needs  . Lack of Transportation (Medical): No  . Lack of Transportation (Non-Medical): No  Physical Activity: Inactive  . Days of Exercise per Week: 0 days  . Minutes of Exercise per Session: 0 min  Stress: Stress Concern Present  . Feeling of Stress : To some extent  Social Connections: Not on file     Family History: The patient's family history includes Acne in his mother; Acute lymphoblastic leukemia in his father; Breast cancer in his maternal aunt; Cancer in his brother; Colon cancer (age of onset: 95) in his mother; Depression in his mother; Diabetes in his father and mother; Hypertension in his mother; Ovarian cancer in his maternal aunt; Stroke in his mother; Thyroid disease in his mother.  History of coronary artery disease notable for grandfather 69- age 55). History of heart failure notable for no members. History of arrhythmia notable for no members.  ROS:   Please see the history of present illness.     All other systems reviewed and are negative.  EKGs/Labs/Other Studies Reviewed:    The following studies were reviewed today:  EKG:  EKG is  ordered today.  The ekg ordered today demonstrates   09/19/20:  Sinus bradycardia 52 ST elevation with out reciprocal depression- similar to 08/13/20  08/13/20: SR 64 ST elevation in anterior leads without reciprocal depression; consistent with normal variant  Recent Labs: 09/27/2019: TSH 2.680 05/27/2020: ALT 19 08/12/2020: Hemoglobin 14.1; Platelets 170 09/03/2020: BUN 12; Creatinine, Ser 1.04; Potassium 4.7; Sodium 141  Recent Lipid Panel    Component Value Date/Time   CHOL 155 05/27/2020 1552   TRIG 224 (H) 05/27/2020 1552   HDL 26 (L) 05/27/2020 1552   CHOLHDL 6.0 (H) 05/27/2020 1552   CHOLHDL 3.9 07/22/2012 0821   VLDL 21 07/22/2012 0821   LDLCALC 91 05/27/2020 1552      Risk Assessment/Calculations:     The 10-year ASCVD risk score [name] DC [name]., et al., 2013) is: 4.7%   Values used to calculate the score:     Age: 17 years     Sex: Male     Is Non-Hispanic African American: Yes     Diabetic: No     Tobacco smoker: Yes     Systolic Blood Pressure: 256 mmHg     Is BP treated: No     HDL Cholesterol: 26 mg/dL     Total Cholesterol: 155 mg/dL   Physical Exam:    VS:  BP 112/68   Pulse 66   Ht 6\' 3"  (1.905 m)   Wt 245 lb 3.2 oz (111.2  kg)   SpO2 97%   BMI 30.65 kg/m     Wt Readings from Last 3 Encounters:  09/19/20 245 lb 3.2 oz (111.2 kg)  09/03/20 246 lb 6.4 oz (111.8 kg)  08/12/20 268 lb 15.4 oz (122 kg)    GEN:  Well nourished, well developed in no acute distress HEENT: Normal NECK: No JVD LYMPHATICS: No lymphadenopathy CARDIAC: RRR, no murmurs, rubs, gallops RESPIRATORY:  Clear to auscultation without rales, wheezing or rhonchi  ABDOMEN: Soft, non-tender, non-distended MUSCULOSKELETAL:  No edema; No deformity  SKIN: Warm and dry NEUROLOGIC:  Alert and oriented x 3 PSYCHIATRIC:  Normal affect   ASSESSMENT:    1. Chest pain of uncertain etiology   2. Prediabetes   3. Tobacco abuse    PLAN:    In order of problems listed above:  Chest Pain Syndrome Pre-diabetes - The patient presents with possible cardiac CP vs just related to moving boxes - will get echocardiogram; had initially planned for ECG stress test but given ST variant this would not be sufficient; if normal echo and CP resolves will plan PRN follow up; otherwise would need NM Stress test with exercise (discussed testing)  Tobacco Abuse - discussed the dangers of tobacco use, both inhaled and oral, which include, but are not limited to cardiovascular disease, increased cancer risk of multiple types of cancer, COPD, peripheral arterial disease, strokes. - counseled on the benefits of smoking cessation. - firmly advised to quit.  - we also reviewed strategies  to maximize success, including:  Removing cigarettes and smoking materials from environment   Stress management  Substitution of other forms of reinforcement (the one cigarette a day approach)  Support of family/friends and group smoking cessation  Selecting a quit date  Patient provided contact information for [facility] or 1-800-QUIT-NOW  Patient provided with [place]'s 8 free smoking cessation classes: (336) (541)428-6805 and [employer].fr       Medication Adjustments/Labs and Tests Ordered: Current medicines are reviewed at length with the patient today.  Concerns regarding medicines are outlined above.  Orders Placed This Encounter  Procedures  . Cardiac Stress Test: Informed Consent Details: Physician/Practitioner Attestation; Transcribe to consent form and obtain patient signature  . ECHOCARDIOGRAM COMPLETE   No orders of the defined types were placed in this encounter.   Patient Instructions  Medication Instructions:  Your physician recommends that you continue on your current medications as directed. Please refer to the Current Medication list given to you today.  *If you need a refill on your cardiac medications before your next appointment, please call your pharmacy*   Lab Work: NONE If you have labs (blood work) drawn today and your tests are completely normal, you will receive your results only by: Marland Kitchen MyChart Message (if you have MyChart) OR . A paper copy in the mail If you have any lab test that is abnormal or we need to change your treatment, we will call you to review the results.   Testing/Procedures: Your physician has requested that you have an echocardiogram. Echocardiography is a painless test that uses sound waves to create images of your heart. It provides your doctor with information about the size and shape of your heart and how well your heart's chambers and valves are working. This procedure takes approximately one hour. There  are no restrictions for this procedure.     Follow-Up:  AS NEEDED At [facility], you and your health needs are our priority.  As part of our continuing mission to  provide you with exceptional heart care, we have created designated Provider Care Teams.  These Care Teams include your primary Cardiologist (physician) and Advanced Practice Providers (APPs -  Physician Assistants and Nurse Practitioners) who all work together to provide you with the care you need, when you need it.               Signed, Werner Lean, MD  09/19/2020 3:48 PM    [place]

## 2020-09-19 ENCOUNTER — Encounter: Payer: Self-pay | Admitting: Internal Medicine

## 2020-09-19 ENCOUNTER — Ambulatory Visit: Payer: Medicare (Managed Care) | Admitting: Internal Medicine

## 2020-09-19 ENCOUNTER — Other Ambulatory Visit: Payer: Self-pay

## 2020-09-19 VITALS — BP 112/68 | HR 66 | Ht 75.0 in | Wt 245.2 lb

## 2020-09-19 DIAGNOSIS — Z720 Tobacco use: Secondary | ICD-10-CM | POA: Insufficient documentation

## 2020-09-19 DIAGNOSIS — R079 Chest pain, unspecified: Secondary | ICD-10-CM | POA: Insufficient documentation

## 2020-09-19 DIAGNOSIS — R7303 Prediabetes: Secondary | ICD-10-CM | POA: Diagnosis not present

## 2020-09-19 NOTE — Patient Instructions (Signed)
Medication Instructions:  Your physician recommends that you continue on your current medications as directed. Please refer to the Current Medication list given to you today.  *If you need a refill on your cardiac medications before your next appointment, please call your pharmacy*   Lab Work: NONE If you have labs (blood work) drawn today and your tests are completely normal, you will receive your results only by: Marland Kitchen MyChart Message (if you have MyChart) OR . A paper copy in the mail If you have any lab test that is abnormal or we need to change your treatment, we will call you to review the results.   Testing/Procedures: Your physician has requested that you have an echocardiogram. Echocardiography is a painless test that uses sound waves to create images of your heart. It provides your doctor with information about the size and shape of your heart and how well your heart's chambers and valves are working. This procedure takes approximately one hour. There are no restrictions for this procedure.     Follow-Up:  AS NEEDED At [facility], you and your health needs are our priority.  As part of our continuing mission to provide you with exceptional heart care, we have created designated Provider Care Teams.  These Care Teams include your primary Cardiologist (physician) and Advanced Practice Providers (APPs -  Physician Assistants and Nurse Practitioners) who all work together to provide you with the care you need, when you need it.

## 2020-09-20 NOTE — Addendum Note (Signed)
Addended by: Jeremy Johann on: 09/20/2020 03:25 PM   Modules accepted: Orders

## 2020-10-24 ENCOUNTER — Other Ambulatory Visit (HOSPITAL_COMMUNITY): Payer: Medicare (Managed Care)

## 2020-10-31 ENCOUNTER — Telehealth (INDEPENDENT_AMBULATORY_CARE_PROVIDER_SITE_OTHER): Payer: Medicare (Managed Care) | Admitting: Nurse Practitioner

## 2020-10-31 ENCOUNTER — Other Ambulatory Visit: Payer: Self-pay

## 2020-10-31 ENCOUNTER — Encounter: Payer: Self-pay | Admitting: Nurse Practitioner

## 2020-10-31 ENCOUNTER — Telehealth: Payer: Self-pay

## 2020-10-31 VITALS — Wt 245.0 lb

## 2020-10-31 DIAGNOSIS — R21 Rash and other nonspecific skin eruption: Secondary | ICD-10-CM | POA: Diagnosis not present

## 2020-10-31 MED ORDER — CETIRIZINE HCL 10 MG PO TABS: 10.0000 mg | ORAL_TABLET | Freq: Every day | ORAL | 2 refills | Status: DC

## 2020-10-31 NOTE — Telephone Encounter (Signed)
Patient consented to virtual appt YL,RMA

## 2020-10-31 NOTE — Progress Notes (Signed)
Virtual Visit via MyChart   This visit type was conducted due to national recommendations for restrictions regarding the COVID-19 Pandemic (e.g. social distancing) in an effort to limit this patient's exposure and mitigate transmission in our community.  Due to his co-morbid illnesses, this patient is at least at moderate risk for complications without adequate follow up.  This format is felt to be most appropriate for this patient at this time.  All issues noted in this document were discussed and addressed.  A limited physical exam was performed with this format.    This visit type was conducted due to national recommendations for restrictions regarding the COVID-19 Pandemic (e.g. social distancing) in an effort to limit this patient's exposure and mitigate transmission in our community.  Patients identity confirmed using two different identifiers.  This format is felt to be most appropriate for this patient at this time.  All issues noted in this document were discussed and addressed.  No physical exam was performed (except for noted visual exam findings with Video Visits).    Date:  10/31/2020   ID:  Scott Farrell, DOB 09-06-1980, MRN [id number]  Patient Location:  Home - spoke to [facility]  Provider location:   Office    Chief Complaint:  Concerned about rash  History of Present Illness:    Scott Farrell is a 40 y.o. male who presents via video conferencing for a telehealth visit today.    The patient does not have symptoms concerning for COVID-19 infection (fever, chills, cough, or new shortness of breath).   Rash This is a new problem. The current episode started 1 to 4 weeks ago (2-3 weeks ago). Location: inner arms and inner thigh. The rash is characterized by itchiness and redness. Pertinent negatives include no anorexia, congestion, cough, diarrhea, fatigue, shortness of breath, sore throat or vomiting. Past treatments include nothing. There is no history of  asthma, eczema or varicella.     Past Medical History:  Diagnosis Date  . Clotting disorder ([place])    personal hx/o? etiology unclear  . Costochondritis    history of  . 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 History of echocardiogram 01/07/11   normal LV function, EF 60%, trace mitral, tricuspid and pulmonic regurgitation; Dr. Tollie Eth  . IBS (irritable bowel syndrome)   . Mental disorder 2013   hospitalized for psychiatric illness prior  . Normal cardiac stress test 01/07/11   negative treadmill test, no evidence of ischemia, good exercise capacity; Tollie Eth, MD;   . Prostatitis 11/2010   Dr. Karsten Ro  . Renal stone 06/2012   Urology consult  . Schizophrenia [facility])    [facility], [place] - Dr. Lennon Alstrom  . Tobacco use disorder    Past Surgical History:  Procedure Laterality Date  . EUS  06/30/2012   Procedure: UPPER ENDOSCOPIC ULTRASOUND (EUS) LINEAR;  Surgeon: Milus Banister, MD;  Location: [facility];  Service: Endoscopy;  Laterality: N/A;  radial linear  . WISDOM TOOTH EXTRACTION       Current Meds  Medication Sig  . aspirin-acetaminophen-caffeine (EXCEDRIN MIGRAINE) 250-250-65 MG tablet Take by mouth as needed for headache or migraine.  . cetirizine (ZYRTEC ALLERGY) 10 MG tablet Take 1 tablet (10 mg total) by mouth daily.  . divalproex (DEPAKOTE) 500 MG DR tablet Take 500 mg by mouth at bedtime.  . nicotine (NICODERM CQ - DOSED IN MG/24 HOURS) 21 mg/24hr patch Place 1 patch (21 mg total) onto the skin daily.  . risperiDONE (  RISPERDAL) 3 MG tablet Take 3 mg by mouth 2 (two) times daily.     Allergies:   Metformin and related   Social History   Tobacco Use  . Smoking status: Current Some Day Smoker    Packs/day: 0.25    Years: 23.00    Pack years: 5.75    Types: Cigars, Cigarettes  . Smokeless tobacco: Never Used  . Tobacco comment: 1-2 black milds daily; started back smoking cigarettes.   Vaping Use  . Vaping Use: Former  . Substances: Nicotine   Substance Use Topics  . Alcohol use: Yes    Comment: occ  . Drug use: Not Currently     Family Hx: The patient's family history includes Acne in his mother; Acute lymphoblastic leukemia in his father; Breast cancer in his maternal aunt; Cancer in his brother; Colon cancer (age of onset: 20) in his mother; Depression in his mother; Diabetes in his father and mother; Hypertension in his mother; Ovarian cancer in his maternal aunt; Stroke in his mother; Thyroid disease in his mother.  ROS:   Please see the history of present illness.    Review of Systems  Constitutional: Negative for fatigue.  HENT: Negative for congestion and sore throat.   Respiratory: Negative for cough, hemoptysis, sputum production, shortness of breath and wheezing.   Cardiovascular: Negative.   Gastrointestinal: Negative for anorexia, diarrhea, heartburn and vomiting.  Musculoskeletal: Negative.   Skin: Positive for rash.  Neurological: Negative for dizziness and tingling.  Psychiatric/Behavioral: Negative.     All other systems reviewed and are negative.   Labs/Other Tests and Data Reviewed:    Recent Labs: 05/27/2020: ALT 19 08/12/2020: Hemoglobin 14.1; Platelets 170 09/03/2020: BUN 12; Creatinine, Ser 1.04; Potassium 4.7; Sodium 141   Recent Lipid Panel Lab Results  Component Value Date/Time   CHOL 155 05/27/2020 03:52 PM   TRIG 224 (H) 05/27/2020 03:52 PM   HDL 26 (L) 05/27/2020 03:52 PM   CHOLHDL 6.0 (H) 05/27/2020 03:52 PM   CHOLHDL 3.9 07/22/2012 08:21 AM   LDLCALC 91 05/27/2020 03:52 PM    Wt Readings from Last 3 Encounters:  10/31/20 245 lb (111.1 kg)  09/19/20 245 lb 3.2 oz (111.2 kg)  09/03/20 246 lb 6.4 oz (111.8 kg)     Exam:    Vital Signs:  Wt 245 lb (111.1 kg)   BMI 30.62 kg/m     Physical Exam Vitals reviewed.  Constitutional:      Appearance: Normal appearance.  Cardiovascular:     Rate and Rhythm: Normal rate and regular rhythm.     Pulses: Normal pulses.     Heart  sounds: Normal heart sounds. No murmur heard.   Pulmonary:     Effort: Pulmonary effort is normal. No respiratory distress.     Breath sounds: Normal breath sounds. No wheezing.  Skin:    General: Skin is warm and dry.     Capillary Refill: Capillary refill takes less than 2 seconds.     Findings: Rash (difficult to see rash virtual) present.  Neurological:     General: No focal deficit present.     Mental Status: He is alert and oriented to person, place, and time.     Cranial Nerves: No cranial nerve deficit.     Motor: No weakness.  Psychiatric:        Mood and Affect: Mood normal.        Behavior: Behavior normal.        Thought Content: Thought  content normal.        Judgment: Judgment normal.     ASSESSMENT & PLAN:    1. Rash and nonspecific skin eruption It is difficult to see the rash on his arms but from what I could see appears to be a prickly rash Will check for food allergies and covid since he also has symptoms of fatigue and chills - cetirizine (ZYRTEC ALLERGY) 10 MG tablet; Take 1 tablet (10 mg total) by mouth daily.  Dispense: 30 tablet; Refill: 2 - Allergens(96) Foods - Novel Coronavirus, NAA ([facility])   COVID-19 Education: The signs and symptoms of COVID-19 were discussed with the patient and how to seek care for testing (follow up with PCP or arrange E-visit).  The importance of social distancing was discussed today.  Patient Risk:   After full review of this patients clinical status, I feel that they are at least moderate risk at this time.  Time:   Today, I have spent 10.45 minutes/ seconds with the patient with telehealth technology discussing above diagnoses.     Medication Adjustments/Labs and Tests Ordered: Current medicines are reviewed at length with the patient today.  Concerns regarding medicines are outlined above.   Tests Ordered: Orders Placed This Encounter  Procedures  . Novel Coronavirus, NAA ([facility])  . Allergens(96) Foods     Medication Changes: Meds ordered this encounter  Medications  . cetirizine (ZYRTEC ALLERGY) 10 MG tablet    Sig: Take 1 tablet (10 mg total) by mouth daily.    Dispense:  30 tablet    Refill:  2    Disposition:  Follow up prn  Signed, [name], FNP

## 2020-11-01 LAB — SARS-COV-2, NAA 2 DAY TAT

## 2020-11-01 LAB — NOVEL CORONAVIRUS, NAA: SARS-CoV-2, NAA: NOT DETECTED

## 2020-11-06 LAB — ALLERGENS (95) FOODS IGE
Allergen Apple, IgE: <0.1 kU/L
Allergen Banana IgE: <0.1 kU/L
Allergen Barley IgE: <0.1 kU/L
Allergen Black Pepper IgE: <0.1 kU/L
Allergen Blueberry IgE: <0.1 kU/L
Allergen Broccoli: <0.1 kU/L
Allergen Cabbage IgE: <0.1 kU/L
Allergen Carrot IgE: <0.1 kU/L
Allergen Cauliflower IgE: <0.1 kU/L
Allergen Celery IgE: <0.1 kU/L
Allergen Cinnamon IgE: <0.1 kU/L
Allergen Coconut IgE: <0.1 kU/L
Allergen Corn, IgE: <0.1 kU/L
Allergen Cucumber IgE: <0.1 kU/L
Allergen Garlic IgE: <0.1 kU/L
Allergen Ginger IgE: <0.1 kU/L
Allergen Gluten IgE: <0.1 kU/L
Allergen Grape IgE: <0.1 kU/L
Allergen Grapefruit IgE: <0.1 kU/L
Allergen Green Bean IgE: <0.1 kU/L
Allergen Green Pea IgE: <0.1 kU/L
Allergen Lamb IgE: <0.1 kU/L
Allergen Lettuce IgE: <0.1 kU/L
Allergen Lime IgE: <0.1 kU/L
Allergen Melon IgE: <0.1 kU/L
Allergen Oat IgE: <0.1 kU/L
Allergen Onion IgE: <0.1 kU/L
Allergen Pear IgE: <0.1 kU/L
Allergen Potato, White IgE: <0.1 kU/L
Allergen Rice IgE: <0.1 kU/L
Allergen Salmon IgE: <0.1 kU/L
Allergen Strawberry IgE: <0.1 kU/L
Allergen Sweet Potato IgE: <0.1 kU/L
Allergen Tomato, IgE: <0.1 kU/L
Allergen Turkey IgE: <0.1 kU/L
Allergen Watermelon IgE: <0.1 kU/L
Allergen, Peach f95: <0.1 kU/L
Basil: <0.1 kU/L
Beef IgE: <0.1 kU/L
C074-IgE Gelatin: <0.1 kU/L
Chicken IgE: <0.1 kU/L
Chocolate/Cacao IgE: <0.1 kU/L
Clam IgE: <0.1 kU/L
Codfish IgE: <0.1 kU/L
Coffee: <0.1 kU/L
Cranberry IgE: <0.1 kU/L
Egg White IgE: <0.1 kU/L
F020-IgE Almond: <0.1 kU/L
F023-IgE Crab: <0.1 kU/L
F045-IgE Yeast: <0.1 kU/L
F076-IgE Alpha Lactalbumin: <0.1 kU/L
F077-IgE Beta Lactoglobulin: <0.1 kU/L
F078-IgE Casein: <0.1 kU/L
F080-IgE Lobster: <0.1 kU/L
F081-IgE Cheese, Cheddar Type: <0.1 kU/L
F089-IgE Mustard: <0.1 kU/L
F096-IgE Avocado: <0.1 kU/L
F202-IgE Cashew Nut: <0.1 kU/L
F214-IgE Spinach: <0.1 kU/L
F222-IgE Tea: <0.1 kU/L
F242-IgE Bing Cherry: <0.1 kU/L
F261-IgE Asparagus: <0.1 kU/L
F262-IgE Eggplant: <0.1 kU/L
F265-IgE Cumin: <0.1 kU/L
F278-IgE Bayleaf (Laurel): <0.1 kU/L
F279-IgE Chili Pepper: <0.1 kU/L
F283-IgE Oregano: <0.1 kU/L
F300-IgE Goat's Milk: <0.1 kU/L
F342-IgE Olive, Black: <0.1 kU/L
F343-IgE Raspberry: <0.1 kU/L
Hops: <0.1 kU/L
IgE Egg (Yolk): <0.1 kU/L
Kidney Bean IgE: <0.1 kU/L
Lemon: <0.1 kU/L
Lima Bean IgE: <0.1 kU/L
Malt: <0.1 kU/L
Mushroom IgE: <0.1 kU/L
Orange: <0.1 kU/L
Paprika IgE: <0.1 kU/L
Peanut IgE: <0.1 kU/L
Pineapple IgE: <0.1 kU/L
Pork IgE: <0.1 kU/L
Pumpkin IgE: <0.1 kU/L
Red Beet: <0.1 kU/L
Rye IgE: <0.1 kU/L
Scallop IgE: <0.1 kU/L
Sesame Seed IgE: <0.1 kU/L
Shrimp IgE: <0.1 kU/L
Soybean IgE: <0.1 kU/L
Tuna: <0.1 kU/L
Vanilla: <0.1 kU/L
Walnut IgE: <0.1 kU/L
Wheat IgE: <0.1 kU/L
Whey: <0.1 kU/L
White Bean IgE: <0.1 kU/L

## 2020-11-11 ENCOUNTER — Telehealth: Payer: Medicare (Managed Care) | Admitting: Nurse Practitioner

## 2020-11-19 ENCOUNTER — Other Ambulatory Visit (HOSPITAL_COMMUNITY): Payer: Medicare (Managed Care)

## 2020-11-19 ENCOUNTER — Encounter (HOSPITAL_COMMUNITY): Payer: Self-pay | Admitting: Internal Medicine

## 2020-11-19 ENCOUNTER — Encounter (HOSPITAL_COMMUNITY): Payer: Self-pay

## 2020-11-21 ENCOUNTER — Ambulatory Visit (HOSPITAL_COMMUNITY): Payer: Medicare (Managed Care) | Attending: Cardiology

## 2020-11-21 ENCOUNTER — Other Ambulatory Visit: Payer: Self-pay

## 2020-11-21 DIAGNOSIS — R079 Chest pain, unspecified: Secondary | ICD-10-CM | POA: Diagnosis present

## 2020-11-21 LAB — ECHOCARDIOGRAM COMPLETE
Area-P 1/2: 3.15 cm2
S' Lateral: 3.7 cm

## 2020-11-25 ENCOUNTER — Telehealth: Payer: Self-pay | Admitting: Internal Medicine

## 2020-11-25 NOTE — Telephone Encounter (Signed)
Patient was returning call for results. Please advise °

## 2020-11-26 NOTE — Telephone Encounter (Signed)
Patient called back to report he is no longer having CP and will follow up PRN.

## 2020-11-26 NOTE — Telephone Encounter (Signed)
Left a detailed message with results and MD recommendations.  Told to call or send in my chart message with answer to f/u question; is pt having CP.

## 2020-12-03 ENCOUNTER — Ambulatory Visit: Payer: Medicare (Managed Care) | Admitting: Nurse Practitioner

## 2020-12-16 ENCOUNTER — Encounter: Payer: Self-pay | Admitting: Nurse Practitioner

## 2020-12-16 ENCOUNTER — Ambulatory Visit: Payer: Medicare (Managed Care) | Admitting: Nurse Practitioner

## 2020-12-16 ENCOUNTER — Other Ambulatory Visit: Payer: Self-pay

## 2020-12-16 VITALS — BP 128/60 | HR 66 | Temp 98.8°F | Ht 73.0 in | Wt 246.2 lb

## 2020-12-16 DIAGNOSIS — E781 Pure hyperglyceridemia: Secondary | ICD-10-CM | POA: Diagnosis not present

## 2020-12-16 DIAGNOSIS — R7303 Prediabetes: Secondary | ICD-10-CM | POA: Diagnosis not present

## 2020-12-16 DIAGNOSIS — Z720 Tobacco use: Secondary | ICD-10-CM

## 2020-12-16 DIAGNOSIS — F17210 Nicotine dependence, cigarettes, uncomplicated: Secondary | ICD-10-CM

## 2020-12-16 DIAGNOSIS — R21 Rash and other nonspecific skin eruption: Secondary | ICD-10-CM

## 2020-12-16 LAB — LIPID PANEL
Chol/HDL Ratio: 5.3 ratio — ABNORMAL HIGH (ref 0.0–5.0)
Cholesterol, Total: 142 mg/dL (ref 100–199)
HDL: 27 mg/dL — ABNORMAL LOW (ref >39)
LDL Chol Calc (NIH): 86 mg/dL (ref 0–99)
Triglycerides: 164 mg/dL — ABNORMAL HIGH (ref 0–149)
VLDL Cholesterol Cal: 29 mg/dL (ref 5–40)

## 2020-12-16 NOTE — Progress Notes (Signed)
I,Tianna Badgett,acting as a Education administrator for [employer], FNP.,have documented all relevant documentation on the behalf of [name], FNP,as directed by  [name], FNP while in the presence of [name], Susan Jamontae Thwaites.  This visit occurred during the SARS-CoV-2 public health emergency.  Safety protocols were in place, including screening questions prior to the visit, additional usage of staff PPE, and extensive cleaning of exam room while observing appropriate contact time as indicated for disinfecting solutions.  Subjective:     Patient ID: Scott Farrell , male    DOB: 1981-05-25 , 40 y.o.   MRN: [id number]   Chief Complaint  Patient presents with   Nicotine Dependence    HPI  Patient is here for nicotine dependence. He has not used his nicotine patches in quite some time. Continues to smoke cigarettes - 1/2 PPD or less.he feels he does not buy cigarettes but once every 2-3 weeks. And will chain smoke black and milds. He also has a rash on his hands that he thinks comes from the gloves at work.   Nicotine Dependence His urge triggers include company of smokers.    Past Medical History:  Diagnosis Date   Clotting disorder [facility])    personal hx/o? etiology unclear   Costochondritis    history of   Herpes genitalia 1/12   History of echocardiogram 01/07/11   normal LV function, EF 60%, trace mitral, tricuspid and pulmonic regurgitation; Dr. Tollie Eth   IBS (irritable bowel syndrome)    Mental disorder 2013   hospitalized for psychiatric illness prior   Normal cardiac stress test 01/07/11   negative treadmill test, no evidence of ischemia, good exercise capacity; Tollie Eth, MD;    Prostatitis 11/2010   Dr. Karsten Ro   Renal stone 06/2012   Urology consult   Schizophrenia [facility])    [facility], [facility] - Dr. Lennon Alstrom   Tobacco use disorder      Family History  Problem Relation Age of Onset   Hypertension Mother    Stroke Mother    Depression Mother    Acne  Mother    Thyroid disease Mother    Diabetes Mother    Colon cancer Mother 57   Acute lymphoblastic leukemia Father    Diabetes Father    Cancer Brother    Breast cancer Maternal Aunt    Ovarian cancer Maternal Aunt      Current Outpatient Medications:    divalproex (DEPAKOTE) 500 MG DR tablet, Take 500 mg by mouth at bedtime., Disp: , Rfl:    risperiDONE (RISPERDAL) 3 MG tablet, Take 3 mg by mouth 2 (two) times daily., Disp: , Rfl:    Allergies  Allergen Reactions   Metformin And Related Other (See Comments)    Chest tightness     Review of Systems  Constitutional: Negative.   Respiratory: Negative.    Cardiovascular: Negative.   Gastrointestinal: Negative.   Skin:  Positive for rash (bilateral hands on the fingers with non fluid filled vesicles).  Neurological: Negative.   Psychiatric/Behavioral: Negative.      Today's Vitals   12/16/20 1146  BP: 128/60  Pulse: 66  Temp: 98.8 F (37.1 C)  TempSrc: Oral  Weight: 246 lb 3.2 oz (111.7 kg)  Height: 6\' 1"  (1.854 m)   Body mass index is 32.48 kg/m.   Objective:  Physical Exam Vitals reviewed.  Constitutional:      General: He is not in acute distress.    Appearance: Normal appearance. He is obese.  HENT:     Head: Normocephalic.     Right Ear: Tympanic membrane, ear canal and external ear normal. There is no impacted cerumen.     Left Ear: External ear normal. There is impacted cerumen.  Cardiovascular:     Rate and Rhythm: Normal rate and regular rhythm.     Pulses: Normal pulses.     Heart sounds: Normal heart sounds. No murmur heard. Pulmonary:     Effort: Pulmonary effort is normal. No respiratory distress.     Breath sounds: Normal breath sounds. No wheezing.  Skin:    General: Skin is warm and dry.     Capillary Refill: Capillary refill takes less than 2 seconds.  Neurological:     General: No focal deficit present.     Mental Status: He is alert and oriented to person, place, and time.     Cranial  Nerves: No cranial nerve deficit.     Motor: No weakness.  Psychiatric:        Mood and Affect: Mood normal.        Behavior: Behavior normal.        Thought Content: Thought content normal.        Judgment: Judgment normal.        Assessment And Plan:     1. Tobacco abuse He also smokes black and milds, I have explained black and milds can be consistent of up to 7 cigarettes with each one.  He is no longer using the nicotine patches.  2. Rash and nonspecific skin eruption Bilateral hands around his fingers with raised rash I have encouraged him to avoid latex gloves  3. Prediabetes Chronic, controlled Continue with current medications Encouraged to limit intake of sugary foods and drinks Encouraged to increase physical activity to 150 minutes per week - AMB Referral to [place]  4. High triglycerides Will check triglycerides  I will also make a referral for [facility] - nurse, social work and pharmacy to assist with any resources. - AMB Referral to [place] - Lipid panel     Patient was given opportunity to ask questions. Patient verbalized understanding of the plan and was able to repeat key elements of the plan. All questions were answered to their satisfaction.  [name], FNP   I, [name], FNP, have reviewed all documentation for this visit. The documentation on 12/16/20 for the exam, diagnosis, procedures, and orders are all accurate and complete.   IF YOU HAVE BEEN REFERRED TO A SPECIALIST, IT MAY TAKE 1-2 WEEKS TO SCHEDULE/PROCESS THE REFERRAL. IF YOU HAVE NOT HEARD FROM US/SPECIALIST IN TWO WEEKS, PLEASE GIVE [place] A CALL AT [phone].   THE PATIENT IS ENCOURAGED TO PRACTICE SOCIAL DISTANCING DUE TO THE COVID-19 PANDEMIC.

## 2020-12-20 ENCOUNTER — Telehealth: Payer: Self-pay | Admitting: *Deleted

## 2020-12-20 NOTE — Chronic Care Management (AMB) (Signed)
  Chronic Care Management   Outreach Note  12/20/2020 Name: JULIENNE BERNSON MRN: [id number] DOB: Nov 17, 1980  [name] is a 40 y.o. year old male who is a primary care patient of [name], [place]. I reached out to [place] by phone today in response to a referral sent by Mr. Jhamir C Bown's PCP, [name], FNP      An unsuccessful telephone outreach was attempted today. The patient was referred to the case management team for assistance with care management and care coordination.   Follow Up Plan: A HIPAA compliant phone message was left for the patient providing contact information and requesting a return call.  The care management team will reach out to the patient again over the next 7 days.  If patient returns call to provider office, please advise to call [place]* at [phone].*  [place] [facility]  Direct Dial: [phone]

## 2020-12-23 ENCOUNTER — Ambulatory Visit (INDEPENDENT_AMBULATORY_CARE_PROVIDER_SITE_OTHER): Payer: Medicare (Managed Care)

## 2020-12-23 ENCOUNTER — Telehealth: Payer: Medicare (Managed Care)

## 2020-12-23 ENCOUNTER — Ambulatory Visit: Payer: Self-pay

## 2020-12-23 DIAGNOSIS — R7303 Prediabetes: Secondary | ICD-10-CM

## 2020-12-23 DIAGNOSIS — E781 Pure hyperglyceridemia: Secondary | ICD-10-CM | POA: Diagnosis not present

## 2020-12-23 DIAGNOSIS — Z8659 Personal history of other mental and behavioral disorders: Secondary | ICD-10-CM

## 2020-12-23 NOTE — Chronic Care Management (AMB) (Signed)
  Chronic Care Management   Note  12/23/2020 Name: CAROL LOFTIN MRN: [id number] DOB: 08/09/1980  [name] is a 40 y.o. year old male who is a primary care patient of [name], [place]. I reached out to [place] by phone today in response to a referral sent by Mr. [place] [name], FNP.     Mr. Stratmann was given information about Chronic Care Management services today including:  CCM service includes personalized support from designated clinical staff supervised by his physician, including individualized plan of care and coordination with other care providers 24/7 contact phone numbers for assistance for urgent and routine care needs. Service will only be billed when office clinical staff spend 20 minutes or more in a month to coordinate care. Only one practitioner may furnish and bill the service in a calendar month. The patient may stop CCM services at any time (effective at the end of the month) by phone call to the office staff. The patient will be responsible for cost sharing (co-pay) of up to 20% of the service fee (after annual deductible is met).  Patient agreed to services and verbal consent obtained.   Follow up plan: Telephone appointment with care management team member scheduled for: PharmD 12/31/2020 and BSW 01/03/2021  [place] Management  Direct Dial: [phone]

## 2020-12-23 NOTE — Chronic Care Management (AMB) (Signed)
Chronic Care Management   CCM RN Visit Note  12/23/2020 Name: Scott Farrell MRN: [id number] DOB: December 23, 1980  Subjective: Scott Farrell is a 40 y.o. year old male who is a primary care patient of [name], [place]. The care management team was consulted for assistance with disease management and care coordination needs.    Collaboration with embedded BSW [name]  for  Case Collaboration  in response to provider referral for case management and/or care coordination services.   Consent to Services:  The patient was given the following information about Chronic Care Management services today, agreed to services, and gave verbal consent: 1. CCM service includes personalized support from designated clinical staff supervised by the primary care provider, including individualized plan of care and coordination with other care providers 2. 24/7 contact phone numbers for assistance for urgent and routine care needs. 3. Service will only be billed when office clinical staff spend 20 minutes or more in a month to coordinate care. 4. Only one practitioner may furnish and bill the service in a calendar month. 5.The patient may stop CCM services at any time (effective at the end of the month) by phone call to the office staff. 6. The patient will be responsible for cost sharing (co-pay) of up to 20% of the service fee (after annual deductible is met). Patient agreed to services and consent obtained.  Patient agreed to services and verbal consent obtained.   Assessment: Review of patient past medical history, allergies, medications, health status, including review of consultants reports, laboratory and other test data, was performed as part of comprehensive evaluation and provision of chronic care management services.   SDOH (Social Determinants of Health) assessments and interventions performed:    CCM Care Plan  Allergies  Allergen Reactions   Metformin And Related Other (See Comments)     Chest tightness    Outpatient Encounter Medications as of 12/23/2020  Medication Sig   divalproex (DEPAKOTE) 500 MG DR tablet Take 500 mg by mouth at bedtime.   risperiDONE (RISPERDAL) 3 MG tablet Take 3 mg by mouth 2 (two) times daily.   No facility-administered encounter medications on file as of 12/23/2020.    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/21/2012    Conditions to be addressed/monitored: Prediabetes, High Triglycerides  Care Plan : Assist with Chronic Care Management and Care Coordination needs  Updates made by Lynne Logan, RN since 12/23/2020 12:00 AM     Problem: Assist with Chronic Care Management and Care Coordination needs   Priority: High     Goal: Assist with Chronic Care Management and Care Coordination needs   Start Date: 12/23/2020  Expected End Date: 01/29/2021  This Visit's Progress: On track  Priority: High  Note:   Current Barriers:  Chronic Disease Management support, education, chronic care management and care coordination needs related to Prediabetes, High Triglycerides with RNCM, SW and Pharmacy Care Management and Care coordination needs Case Manager Clinical Goal(s):  Patient will work with the CCM team to address needs related to chronic care management and care coordination needs related to Prediabetes, High Triglycerides with  RNCM, SW and Pharmacy Care Management and Care Coordination needs Interventions:  Collaborated with BSW to initiate plan of care to address needs related to chronic care management and care coordination needs related to with  Prediabetes, High Triglycerides with RNCM, SW and Pharmacy Care Management and Care Coordination needs Collaboration with [place]ale Chard, MD regarding development and update of comprehensive plan of care as evidenced by provider attestation and co-signature Inter-disciplinary care team collaboration (see longitudinal plan of care) Patient Goals/Self-Care Activities patient will:   - Patient will work with the CCM team to address chronic care management and care coordination needs and will continue to work with the clinical team to address health care and disease management related needs.   Follow Up Plan: The care management team will reach out to the patient again over the next 30-45 days.      Plan:Telephone follow up appointment with care management team member scheduled for:  01/16/21  Barb Merino, RN, BSN, CCM Care Management Coordinator [place] [facility]  Direct Phone: [phone]

## 2020-12-23 NOTE — Patient Instructions (Signed)
Social Worker Visit Information  Goals we discussed today:   Goals Addressed             This Visit's Progress    Follow up with mental health provider concerning reported symptoms of hearing voices       Timeframe:  Short-Term Goal Priority:  High Start Date:   7.25.22                          Expected End Date:  8.24.22                     Next planned outreach: 8.5.22  Patient Goals/Self-Care Activities patient will:   - Patient will self administer medications as prescribed -Contact mental health provider as needed  -Dial 911 if he begins to feel unsafe         Materials provided: Verbal education about mental health resources provided by phone  Scott Farrell was given information about Chronic Care Management services today including:  CCM service includes personalized support from designated clinical staff supervised by his physician, including individualized plan of care and coordination with other care providers 24/7 contact phone numbers for assistance for urgent and routine care needs. Service will only be billed when office clinical staff spend 20 minutes or more in a month to coordinate care. Only one practitioner may furnish and bill the service in a calendar month. The patient may stop CCM services at any time (effective at the end of the month) by phone call to the office staff. The patient will be responsible for cost sharing (co-pay) of up to 20% of the service fee (after annual deductible is met).  Patient agreed to services and verbal consent obtained.   Patient verbalizes understanding of instructions provided today and agrees to view in [place].   Follow up plan: Appointment scheduled for SW follow up with client by phone on: 8.5.22   [name], [place], CDP Social Worker, Certified Dementia Practitioner [place] / [place] [facility] [phone]

## 2020-12-23 NOTE — Chronic Care Management (AMB) (Signed)
Chronic Farrell Management    Social Work Note  12/23/2020 Name: Scott Farrell MRN: [id number] DOB: [birth year]  Scott Farrell is a 40 y.o. year old male who is a primary Farrell patient of Scott Farrell, Scott Farrell. The CCM team was consulted to assist the patient with chronic disease management and/or Farrell coordination needs related to:  pre-diabetes and high triglycerides.  .   Engaged with patient by telephone for initial visit in response to provider referral for social work chronic Farrell management and Farrell coordination services.   Consent to Services:  The patient was given the following information about Chronic Farrell Management services today, agreed to services, and gave verbal consent: 1. CCM service includes personalized support from designated clinical staff supervised by the primary Farrell provider, including individualized plan of Farrell and coordination with other Farrell providers 2. 24/7 contact phone numbers for assistance for urgent and routine Farrell needs. 3. Service will only be billed when office clinical staff spend 20 minutes or more in a month to coordinate Farrell. 4. Only one practitioner may furnish and bill the service in a calendar month. 5.The patient may stop CCM services at any time (effective at the end of the month) by phone call to the office staff. 6. The patient will be responsible for cost sharing (co-pay) of up to 20% of the service fee (after annual deductible is met). Patient agreed to services and consent obtained.  Patient agreed to services and consent obtained.   Assessment: Review of patient past medical history, allergies, medications, and health status, including review of relevant consultants reports was performed today as part of a comprehensive evaluation and provision of chronic Farrell management and Farrell coordination services.     SDOH (Social Determinants of Health) assessments and interventions performed:    Advanced Directives Status: Not addressed in this  encounter.  CCM Farrell Plan  Allergies  Allergen Reactions   Metformin And Related Other (See Comments)    Chest tightness    Outpatient Encounter Medications as of 12/23/2020  Medication Sig   divalproex (DEPAKOTE) 500 MG DR tablet Take 500 mg by mouth at bedtime.   risperiDONE (RISPERDAL) 3 MG tablet Take 3 mg by mouth 2 (two) times daily.   No facility-administered encounter medications on file as of 12/23/2020.    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    Conditions to be addressed/monitored:  Pre-Diabetes and High Triglycerides ; Mental Health Concerns   Farrell Plan : Social Work [name]  Updates made by Scott Farrell since 12/23/2020 12:00 AM     Problem: Disease Management      Goal: Follow up with mental health provider concerning reported symptoms of hearing voices   Start Date: 12/23/2020  Expected End Date: 01/22/2021  Priority: High  Note:   Current Barriers:  Chronic disease management support and education needs related to  Pre-Diabetes and High Triglycerides   History of Schizophrenia Ineffective medication regimen as evidenced by self-reported auditory hallucinations  Social Worker Clinical Goal(s):  patient will work with SW to identify and address any acute and/or chronic Farrell coordination needs  related to the self health management of  pre diabetes, high triglycerides, and Schizophrenia   Patient will increase mental health medications as instructed by Scott Farrell provider Scott Farrell  Patient will dial 911, contact his mental health provider, or  visit Scott Farrell if mental health symptoms persist or if the patient begins to feel unsafe  SW Interventions:  Inter-disciplinary Farrell team collaboration (see longitudinal plan of Farrell) Collaboration with Scott Farrell, Scott Farrell regarding development and update of comprehensive plan of Farrell as evidenced by provider attestation and co-signature Collaboration with Scott Farrell, scheduling Farrell guide who indicates while speaking with the patient the patient indicating he is hearing voices and would like some help Successful outbound call placed to the patient to assess for acute needs Patient reports he has an extensive history of schizophrenia which has included several inpatient stays in both [place] and [place] Patient recently decided he would like to enter the workforce and got a job - while working patient began hearing voices again  Discussed the patient felt the stress at work caused voices to return so he decided to stop working - patient is unsure if he will begin receiving disability again or not Determined that since stopping work the patient has continued to hear voices Assessed for current involvement with a mental health provider- patient is active with Scott Paradise, NP with Scott Farrell Patient indicates he did contact his mental health provider today to report auditory hallucinations - patients medication doses were increased Confirmed patient does have plans to pick up increased doses at pharmacy Assessed for patient support system- patient lives at home with his mother who is supportive and aware of current symptoms Discussed patient has a 15 year old daughter named Scott Farrell which motivates him to take Farrell of himself by attending physician appointments and taking medication as directed  Discussed concern of patients medication not managing symptoms as it should  Advised the patient to dial 911 if he ever feels unsafe Educated the patient on opportunity to access [facility] Farrell located at [address]  Discussed this urgent Farrell is open 24/7 if needed Provided the patient with written information via text at the request of the patient Determined the patient currently feels comfortable with the plan to increase current medication doses in an attempt to control auditory hallucination Advised the patient to contact SW directly as needed prior to next scheduled appointment Collaboration with [place] to discuss interventions and plan  Patient Goals/Self-Farrell Activities patient will:   - Patient will self administer medications as prescribed -Contact mental health provider as needed  -Dial 911 if he begins to feel unsafe  Follow Up Plan:  SW will follow up with the patient on 8.5.22       Follow Up Plan:  SW will follow up with the patient on 8.5.22      Scott Farrell, BSW, CDP Social Worker, Certified Dementia Practitioner [place] [facility] [place] [facility] [phone]

## 2020-12-25 NOTE — Progress Notes (Signed)
Upon completion of consent for CCM note patient stated that "he is hearing voices in his head and needs to talk to someone." Ask patient if he wanted to harm himself or anyone else and he stated"no." Outreached to [employer] while patient was still on phone and ask if she could make outreach.Tillie Rung BSW called patient while I was on the phone with him and we disconnected so he could continue call with [facility].   [place] [facility]  Direct Dial: [phone]

## 2020-12-30 ENCOUNTER — Telehealth: Payer: Self-pay

## 2020-12-30 NOTE — Chronic Care Management (AMB) (Addendum)
    Chronic Care Management Pharmacy Assistant   Name: Scott Farrell  MRN: [id number] DOB: 1980/11/17   Reason for Encounter: Chart review for CPP visit on 12-31-2020   Conditions to be addressed/monitored: HLD and DMII  Recent office visits:  12-23-2020 Lynne Logan, RN (CCM)  12-23-2020 [name] (CCM)  12-16-2020 [name], [place]. STOP Excedrin migraine. STOP Zyrtec. STOP Nicoderm CQ patch. Trig= 164, HDL= 27, Chol/HDL= 5.3  10-31-2020 [name], FNP. STOP Nexium. START Zyrtec 10 mg daily. Allergens 95/96 foods orders placed. Covid test placed= negative.  09-03-2020 [name], [place]. START Nicoderm CQ 21 mg/24 hour patch. STOP Metformin. CHANGE Risperidone 2 mg twice daily TO 3 mg twice daily. A1C= 5.7. Glucose= 104. Ear cerumen removal.  07-03-2020 [name], FNP. Medicare wellness.  Recent consult visits:  09-19-2020 Werner Lean, MD (Cardiology). STOP Depakote ER 250 mg daily. START Depakote DR 500 mg nightly. EKG and ECHO placed.  Hospital visits:  Medication Reconciliation was completed by comparing discharge summary, patient's EMR and Pharmacy list, and upon discussion with patient.  Admitted to the hospital on 08-12-2020 due to chest tightness. Discharge date was 08-13-2020. Discharged from [facility]?Medications Started at [facility] Discharge:?? None  Medication Changes at Hospital Discharge: None  Medications Discontinued at Hospital Discharge: None  Medications that remain the same after Hospital Discharge:??  -All other medications will remain the same.    Medications: Outpatient Encounter Medications as of 12/30/2020  Medication Sig   divalproex (DEPAKOTE) 500 MG DR tablet Take 500 mg by mouth at bedtime.   risperiDONE (RISPERDAL) 3 MG tablet Take 3 mg by mouth 2 (two) times daily.   No facility-administered encounter medications on file as of 12/30/2020.   Please bring medications and supplements to  appointment  Have you seen any other providers since your last visit? Patient stated no  Any changes in your medications or health? Patient stated no  Any side effects from any medications? Patient stated no  Do you have an symptoms or problems not managed by your medications? Patient stated he has had a rash on both arms for 8 months which itches at times. Patient isn't sure if it's a side effect.  Any concerns about your health right now? Patient stated he has issues with sleeping and needs to find a doctor that accepts his insurance for a colonoscopy.  Has your provider asked that you check blood pressure, blood sugar, or follow special diet at home? Patient stated no but was taking Metformin which gave him chest tightness. Medication was discontinued.  Do you get any type of exercise on a regular basis? Patient states he walks a lot daily.  Can you think of a goal you would like to reach for your health? Patient would like to be medication free and more energetic.   Do you have any problems getting your medications? Patient stated no.  Is there anything that you would like to discuss during the appointment? Patient stated just help with feeling normal.  Please bring medications and supplements to appointment  NOTES: Patient rescheduled missed appointment with [name] CPP on 12-31-2020 to 01-16-2021.  Care Gaps: Colonoscopy overdue Flu vaccine overdue Pneumococcal overdue Last medicare wellness 07-03-2020  Star Rating Drugs: None  Jeannette How [facility] Clinical Pharmacist Assistant [phone]

## 2020-12-31 ENCOUNTER — Telehealth: Payer: Medicare (Managed Care)

## 2021-01-03 ENCOUNTER — Ambulatory Visit (INDEPENDENT_AMBULATORY_CARE_PROVIDER_SITE_OTHER): Payer: Medicare (Managed Care)

## 2021-01-03 DIAGNOSIS — Z8659 Personal history of other mental and behavioral disorders: Secondary | ICD-10-CM

## 2021-01-03 DIAGNOSIS — R7303 Prediabetes: Secondary | ICD-10-CM

## 2021-01-03 DIAGNOSIS — E781 Pure hyperglyceridemia: Secondary | ICD-10-CM

## 2021-01-03 NOTE — Patient Instructions (Signed)
Social Worker Visit Information  Goals we discussed today:   Goals Addressed             This Visit's Progress    COMPLETED: Follow up with mental health provider concerning reported symptoms of hearing voices       Timeframe:  Short-Term Goal Priority:  High Start Date:   7.25.22                          Expected End Date:  8.24.22                      Patient Goals/Self-Care Activities patient will:   - Patient will self administer medications as prescribed -Contact mental health provider as needed  -Dial 911 if he begins to feel unsafe     Obtain better understanding of community resources       Timeframe:  Long-Range Goal Priority:  [employer] Start Date:    8.5.22                         Expected End Date: 11.3.22            Next planned outreach: 9.14.22             Patient Goals/Self-Care Activities patient will:   - Review mailed resources -Contact SW as needed         Materials provided: Yes: provided verbal and written resource materials  Mr. Shearn was given information about Chronic Care Management services today including:  CCM service includes personalized support from designated clinical staff supervised by his physician, including individualized plan of care and coordination with other care providers 24/7 contact phone numbers for assistance for urgent and routine care needs. Service will only be billed when office clinical staff spend 20 minutes or more in a month to coordinate care. Only one practitioner may furnish and bill the service in a calendar month. The patient may stop CCM services at any time (effective at the end of the month) by phone call to the office staff. The patient will be responsible for cost sharing (co-pay) of up to 20% of the service fee (after annual deductible is met).  Patient agreed to services and verbal consent obtained.   Patient verbalizes understanding of instructions provided today and agrees to view in [place].    Follow up plan: SW will follow up with patient by phone over the next 45 days   [name], BSW, CDP Social Worker, Certified Dementia Practitioner [place] [facility] [place] [facility] [phone]

## 2021-01-03 NOTE — Chronic Care Management (AMB) (Signed)
Chronic Care Management    Social Work Note  01/03/2021 Name: Scott Farrell MRN: [id number] DOB: 1980-11-29  Scott Farrell is a 40 y.o. year old male who is a primary care patient of [name], [place]. The CCM team was consulted to assist the patient with chronic disease management and/or care coordination needs related to: [employer] .   Engaged with patient by telephone for initial visit in response to provider referral for social work chronic care management and care coordination services.   Consent to Services:  The patient was given the following information about Chronic Care Management services today, agreed to services, and gave verbal consent: 1. CCM service includes personalized support from designated clinical staff supervised by the primary care provider, including individualized plan of care and coordination with other care providers 2. 24/7 contact phone numbers for assistance for urgent and routine care needs. 3. Service will only be billed when office clinical staff spend 20 minutes or more in a month to coordinate care. 4. Only one practitioner may furnish and bill the service in a calendar month. 5.The patient may stop CCM services at any time (effective at the end of the month) by phone call to the office staff. 6. The patient will be responsible for cost sharing (co-pay) of up to 20% of the service fee (after annual deductible is met). Patient agreed to services and consent obtained.  Patient agreed to services and consent obtained.   Assessment: Review of patient past medical history, allergies, medications, and health status, including review of relevant consultants reports was performed today as part of a comprehensive evaluation and provision of chronic care management and care coordination services.     SDOH (Social Determinants of Health) assessments and interventions performed:  SDOH Interventions    Flowsheet Row Most Recent Value  SDOH  Interventions   Food Insecurity Interventions Other (Comment)  [mailed information on out of the garden project]  Housing Interventions Other (Comment)  [mailed information on housing programs]  Transportation Interventions Intervention Not Indicated        Advanced Directives Status: Not addressed in this encounter.  CCM Care Plan  Allergies  Allergen Reactions   Metformin And Related Other (See Comments)    Chest tightness    Outpatient Encounter Medications as of 01/03/2021  Medication Sig   divalproex (DEPAKOTE) 500 MG DR tablet Take 500 mg by mouth at bedtime.   risperiDONE (RISPERDAL) 3 MG tablet Take 3 mg by mouth 2 (two) times daily.   No facility-administered encounter medications on file as of 01/03/2021.    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    Conditions to be addressed/monitored:  prediabtes and high triglycerides ; Financial constraints related to costs of living  Care Plan : Social Work [place]  Updates made by [name] since 01/03/2021 12:00 AM     Problem: Disease Management Resolved 01/03/2021     Goal: Follow up with mental health provider concerning reported symptoms of hearing voices Completed 01/03/2021  Start Date: 12/23/2020  Expected End Date: 01/22/2021  Priority: High  Note:   Current  Barriers:  Chronic disease management support and education needs related to  Pre-Diabetes and High Triglycerides   History of Schizophrenia Ineffective medication regimen as evidenced by self-reported auditory hallucinations  Social Worker Clinical  Goal(s):  patient will work with SW to identify and address any acute and/or chronic care coordination needs related to the self health management of  pre diabetes, high triglycerides, and Schizophrenia   Patient will increase mental health medications as instructed by [facility] provider [name]  Patient will dial 911, contact his mental health provider, or visit [facility] if mental health symptoms persist or if the patient begins to feel unsafe  SW Interventions:  Inter-disciplinary care team collaboration (see longitudinal plan of care) Collaboration with [name], FNP regarding development and update of comprehensive plan of care as evidenced by provider attestation and co-signature Successful outbound call placed to the patient to assess goal progression Determined the patient has increased his medications as directed by his mental health provider Discussed the patient is no longer hearing voices but is feeling more tired with the higher dose Encouraged the patient to speak with his provider as needed regarding medication concerns   Patient Goals/Self-Care Activities patient will:   - Patient will self administer medications as prescribed -Contact mental health provider as needed  -Dial 911 if he begins to feel unsafe     Problem: Quality of Life (General Plan of Care)      Long-Range Goal: Obtain better understanding of Community Resources   Start Date: 01/03/2021  Expected End Date: 04/03/2021  Priority: Medium  Note:   Current Barriers:  Chronic disease management support and education needs related to  Pre-diabetes and high triglycerides   Limited knowledge of community resources to assist with financial burden related to the cost of food, credit card debt, home repair and utility costs  Social Worker Clinical Goal(s):  patient will work with SW to identify and address any acute and/or chronic care coordination needs related to the self health  management of pre-diabetes and high triglycerides  Patient will work with SW to become more knowledgeable of community resources  SW Interventions:  Inter-disciplinary care team collaboration (see longitudinal plan of care) Collaboration with [name], [place] regarding development and update of comprehensive plan of care as evidenced by provider attestation and co-signature Successful outbound call placed to the patient to conduct an SDoH screen Determined the patient is in need of resources to assist with the cost of food, credit card debt, home repair, and utility costs Discussed the patient lives with his mother who he believes owns the home Patient reports the upstairs bathtub leaks and you can see an outline in the ceiling of the tup from the downstairs Educated the patient on programs including [employer] and [employer] information to the patients home advising his mother  would need to contact resources as the home is in her name Discussed the patient occasionally has difficulty affording the cost of food Provided verbal and written education on Out of the [employer] Patients reports difficulty affording utilities at times Educated the patient on the [employer] program offered through DSS to assist with heating costs Mailed the patient information on this program Patient requests resources to help get out of credit card debt Advised the patient SW would mail information on credit counseling offered by [employer] of the [employer] Scheduled follow up call over the next 45 days  Patient Goals/Self-Care Activities patient will:   -  Review mailed resources -Contact SW as needed  Follow Up Plan:  SW will follow up with the patient over the next 45 days       Follow Up Plan: SW will follow up with patient by phone over the next 45 days      [name], BSW, CDP Social Worker, Certified Dementia Practitioner [place] [facility] [place] [facility] [phone]

## 2021-01-16 ENCOUNTER — Ambulatory Visit: Payer: Medicare (Managed Care)

## 2021-01-16 ENCOUNTER — Telehealth: Payer: Medicare (Managed Care)

## 2021-01-16 DIAGNOSIS — Z8659 Personal history of other mental and behavioral disorders: Secondary | ICD-10-CM

## 2021-01-16 DIAGNOSIS — E781 Pure hyperglyceridemia: Secondary | ICD-10-CM

## 2021-01-16 DIAGNOSIS — Z720 Tobacco use: Secondary | ICD-10-CM

## 2021-01-16 NOTE — Progress Notes (Signed)
Chronic Care Management Pharmacy Note  01/21/2021 Name:  Scott Farrell MRN:  [id number] DOB:  02-Dec-1980  Summary: Patient reports that he is smoking frequently and not eating healthy.   Recommendations/Changes made from today's visit: Recommend patient continue current medication regimen.   Plan: Will discuss smoking cessation with patient at follow up appointment   Subjective: Scott Farrell is an 40 y.o. year old male who is a primary patient of [name], [place].  The CCM team was consulted for assistance with disease management and care coordination needs.    Engaged with patient by telephone for initial visit in response to provider referral for pharmacy case management and/or care coordination services. He is originally from [place] and he has one daughter who is in fifth grade he went to [employer]. His does not have a day to day routine, some days he sleeps a lot and other times he is up until seven in the morning. He reports not sleeping well. He does side jobs by helping his family with different things that mowing the yard or helping his family around the house. Patient was working at [employer] for a month but hearing voices was distracting.   Consent to Services:  The patient was given information about Chronic Care Management services, agreed to services, and gave verbal consent prior to initiation of services.  Please see initial visit note for detailed documentation.   Patient Care Team: [name], [place] as PCP - General (General Practice) [name] as [place], [place], [facility] (Pharmacist)  Recent office visits:  12-23-2020 Lynne Logan, RN (CCM)   12-23-2020 [name] (CCM)   12-16-2020 [name], [place]. STOP Excedrin migraine. STOP Zyrtec. STOP Nicoderm CQ patch. Trig= 164, HDL= 27, Chol/HDL= 5.3   10-31-2020 [name], FNP. STOP Nexium. START Zyrtec 10 mg  daily. Allergens 95/96 foods orders placed. Covid test placed= negative.   09-03-2020 [name], [place]. START Nicoderm CQ 21 mg/24 hour patch. STOP Metformin. CHANGE Risperidone 2 mg twice daily TO 3 mg twice daily. A1C= 5.7. Glucose= 104. Ear cerumen removal.   07-03-2020 [name], FNP. Medicare wellness.   Recent consult visits:  09-19-2020 Werner Lean, MD (Cardiology). STOP Depakote ER 250 mg daily. START Depakote DR 500 mg nightly. EKG and ECHO placed.   Hospital visits:  Medication Reconciliation was completed by comparing discharge summary, patient's EMR and Pharmacy list, and upon discussion with patient.   Admitted to the hospital on 08-12-2020 due to chest tightness. Discharge date was 08-13-2020. Discharged from [place]?Medications Started at [facility] Discharge:?? None   Medication Changes at Hospital Discharge: None   Medications Discontinued at Hospital Discharge: None   Medications that remain the same after Hospital Discharge:??  -All other medications will remain the same.     Objective:  Lab Results  Component Value Date   CREATININE 1.04 09/03/2020   BUN 12 09/03/2020   GFRNONAA >60 08/12/2020   GFRAA 101 05/27/2020   NA 141 09/03/2020   K 4.7 09/03/2020   CALCIUM 9.9 09/03/2020   CO2 21 09/03/2020   GLUCOSE 89 09/03/2020    Lab Results  Component Value Date/Time   HGBA1C 5.7 (H) 09/03/2020 04:40 PM   HGBA1C 6.0 (H) 05/27/2020 03:52 PM    Last diabetic Eye exam: No results found for: HMDIABEYEEXA  Last diabetic Foot exam: No results found for: HMDIABFOOTEX   Lab Results  Component Value Date   CHOL 142 12/16/2020   HDL 27 (L) 12/16/2020   LDLCALC 86 12/16/2020   TRIG 164 (H) 12/16/2020   CHOLHDL 5.3 (H) 12/16/2020    Hepatic Function Latest Ref Rng & Units 05/27/2020 09/27/2019 04/23/2019  Total Protein 6.0 - 8.5 g/dL 7.5 7.2 7.6  Albumin 4.0 - 5.0 g/dL 4.8 4.8 4.2  AST 0 - 40 IU/L 22 26 34  ALT 0  - 44 IU/L '19 19 29  ' Alk Phosphatase 44 - 121 IU/L 80 70 63  Total Bilirubin 0.0 - 1.2 mg/dL 0.4 0.3 0.6    Lab Results  Component Value Date/Time   TSH 2.680 09/27/2019 11:47 AM   TSH 1.471 11/11/2010 04:02 PM    CBC Latest Ref Rng & Units 08/12/2020 09/27/2019 04/23/2019  WBC 4.0 - 10.5 K/uL 5.3 4.9 6.9  Hemoglobin 13.0 - 17.0 g/dL 14.1 14.2 14.1  Hematocrit 39.0 - 52.0 % 41.2 40.5 42.3  Platelets 150 - 400 K/uL 170 180 165    No results found for: VD25OH  Clinical ASCVD: No  The 10-year ASCVD risk score [name] DC Jr., et al., 2013) is: 5.8%   Values used to calculate the score:     Age: 44 years     Sex: Male     Is Non-Hispanic African American: Yes     Diabetic: No     Tobacco smoker: Yes     Systolic Blood Pressure: 161 mmHg     Is BP treated: No     HDL Cholesterol: 27 mg/dL     Total Cholesterol: 142 mg/dL    Depression screen [facility] 2/9 09/03/2020 07/03/2020 09/27/2019  Decreased Interest 0 1 2  Down, Depressed, Hopeless 0 1 1  PHQ - 2 Score 0 2 3  Altered sleeping - 1 1  Tired, decreased energy - 2 1  Change in appetite - 0 1  Feeling bad or failure about yourself  - 1 1  Trouble concentrating - 0 0  Moving slowly or fidgety/restless - 0 1  Suicidal thoughts - 0 0  PHQ-9 Score - 6 8  Difficult doing work/chores - Somewhat difficult Not difficult at all     Social History   Tobacco Use  Smoking Status Some Days   Packs/day: 0.25   Years: 23.00   Pack years: 5.75   Types: Cigars, Cigarettes  Smokeless Tobacco Never  Tobacco Comments   1-2 black milds daily; started back smoking cigarettes.    BP Readings from Last 3 Encounters:  12/16/20 128/60  09/19/20 112/68  09/03/20 114/72   Pulse Readings from Last 3 Encounters:  12/16/20 66  09/19/20 66  09/03/20 78   Wt Readings from Last 3 Encounters:  12/16/20 246 lb 3.2 oz (111.7 kg)  10/31/20 245 lb (111.1 kg)  09/19/20 245 lb 3.2 oz (111.2 kg)   BMI Readings from Last 3 Encounters:  12/16/20 32.48  kg/m  10/31/20 30.62 kg/m  09/19/20 30.65 kg/m    Assessment/Interventions: Review of patient past medical history, allergies, medications, health status, including review of consultants reports, laboratory and other test data, was performed as part of comprehensive evaluation and provision of chronic care management services.   SDOH:  (Social Determinants of Health) assessments and interventions performed: Yes  SDOH Screenings   Alcohol Screen: Not on file  Depression (PHQ2-9): Low Risk    PHQ-2 Score: 0  Financial Resource Strain: Low Risk    Difficulty of Paying Living Expenses: Not hard at all  Food Insecurity: Landscape architect Present   Worried About Charity fundraiser in the Last Year: Sometimes true   Arboriculturist in the Last Year: Sometimes true  Housing: Medium Risk   Last Housing Risk Score: 1  Physical Activity: Inactive   Days of Exercise per Week: 0 days   Minutes of Exercise per Session: 0 min  Social Connections: Not on file  Stress: Stress Concern Present   Feeling of Stress : To some extent  Tobacco Use: High Risk   Smoking Tobacco Use: Some Days   Smokeless Tobacco Use: Never  Transportation Needs: No Transportation Needs   Lack of Transportation (Medical): No   Lack of Transportation (Non-Medical): No    CCM Care Plan  Allergies  Allergen Reactions   Metformin And Related Other (See Comments)    Chest tightness    Medications Reviewed Today     Reviewed by [name], FNP (Family Nurse Practitioner) on 12/16/20 at [facility] List Status: <None>   Medication Order Taking? Sig Documenting Provider Last Dose Status Informant  divalproex (DEPAKOTE) 500 MG DR tablet [id number] Yes Take 500 mg by mouth at bedtime. [provider] Taking Active   risperiDONE (RISPERDAL) 3 MG tablet [id number] Yes Take 3 mg by mouth 2 (two) times daily. [provider] Taking Active             Patient Active Problem List   Diagnosis Date  Noted   Chest pain of uncertain etiology 48/18/5631   Tobacco abuse 09/19/2020   Prediabetes 09/13/2020   High triglycerides 09/13/2020   Other headache syndrome 07/31/2019   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/31/2019   Lightheaded 07/31/2019   Callus of foot 01/27/2018   Unspecified constipation 04/27/2012   Family history of malignant neoplasm of gastrointestinal tract 04/27/2012   Gastric mass 04/21/2012    Immunization History  Administered Date(s) Administered   Influenza Inj Mdck Quad Pf 06/02/2018   Influenza-Unspecified 03/02/2020   Moderna Sars-Covid-2 Vaccination 09/18/2019, 10/19/2019, 05/18/2020    Conditions to be addressed/monitored:  Hyperlipidemia, Tobacco use, and Schizophrenia  Care Plan : [place]  Updates made by Mayford Knife, [place] since 01/21/2021 12:00 AM     Problem: High Triglycerides, SCHIZOPHRENIA, TOBACCO USE   Priority: High     Long-Range Goal: Disease Management   This Visit's Progress: On track  Note:   Current Barriers:  Unable to independently monitor therapeutic efficacy Unable to achieve control of triglycerides    Pharmacist Clinical Goal(s):  Patient will achieve adherence to monitoring guidelines and medication adherence to achieve therapeutic efficacy through collaboration with PharmD and provider.   Interventions: 1:1 collaboration with [name], FNP regarding development and update of comprehensive plan of care as evidenced by provider attestation and co-signature Inter-disciplinary care team collaboration (see longitudinal plan of care) Comprehensive medication review performed; medication list updated in electronic medical record  High Triglycerides: (LDL goal < 100) -Uncontrolled -Current treatment: Not currently taking any medication  -Medications previously tried: none noted   -Current dietary patterns: breakfast  he eats cereal  (cheerios) with milk and eggs. He reports he eats a "Solicitor of breakfast.  Dinner he usually eats fast food: steak and cheese sub, McDonalds - McDouble, small fry and large sweet tea, or taco bell-some type of beef burrito with a chicken soft taco. Patient reports interest in eating healthy but the cost is too high for healthy food.  -Current exercise habits: Patient reports walking and doing exercises  at home from time to time like push ups. He is walking twice a week, sometimes he walks to the store which is twenty minutes away and other times he walks up and down the block.  -Educated on Cholesterol goals;  Importance of limiting foods high in cholesterol; Exercise goal of 150 minutes per week; -Counseled on diet and exercise extensively  -Collaborate with patient to help with food pantry options including the [employer] (Goal: reduce symptoms) -Controlled -Current treatment: Divalproex 500 mg DR - taking 1 tablet by mouth at bedtime  Risperidone 3 mg by mouth two times daily  -Patient is able to handle the voices -Patient reports he is open to any type of counseling available.  -He has a NP that he sees from [place] with [employer] for mental health support -Educated on Benefits of medication for symptom control Benefits of cognitive-behavioral therapy with or without medication -Recommended to continue current medication  Tobacco use (Goal: Smoking Cessation ) -Uncontrolled -Previous quit attempts: 2 -Current treatment  Nicotine Patch 21 mg - patient reports that they worked  -Patient smokes After 30 minutes of waking -Patient triggers include: taking a work break and seeing someone else smoke,  stress and boredom  and smelling cigarette smoke -On a scale of 1-10, reports MOTIVATION to quit is 5 -On a scale of 1-10, reports CONFIDENCE in quitting is 8 -Fagerstrom Test Score: 8  -Patient reports that he smokes Black and Mild Cigars  -Provided contact information for [place] Quit Line (1-800-QUIT-NOW) and encouraged  patient to reach out to this group for support. -Recommended patient think about quit smoking again  Health Maintenance -Vaccine gaps: Pneumonia Vaccine  -Recommended to continue current medication    Patient Goals/Self-Care Activities Patient will:  - take medications as prescribed  Follow Up Plan: The patient has been provided with contact information for the care management team and has been advised to call with any health related questions or concerns.       Medication Assistance: None required.  Patient affirms current coverage meets needs.  Compliance/Adherence/Medication fill history: Care Gaps: Pneumoccocal Vaccine Colonoscopy  Influenza vaccine  Star-Rating Drugs: None noted  Patient's preferred pharmacy is:  [id number] [place], [place] - [id number] [id number]: [phone]: [phone] Uses pill box? No - patient has children Pt endorses 80% compliance  We discussed: Benefits of medication synchronization, packaging and delivery as well as enhanced pharmacist oversight with [employer]. Patient decided to: Continue current medication management strategy  Care Plan and Follow Up Patient Decision:  Patient agrees to Care Plan and Follow-up.  Plan: The patient has been provided with contact information for the care management team and has been advised to call with any health related questions or concerns.   VOrlando Penner PharmD Clinical Pharmacist [facility] [phone]

## 2021-01-21 NOTE — Patient Instructions (Addendum)
Visit Information It was great speaking with you today!  Please let me know if you have any questions about our visit.   Goals Addressed             This Visit's Progress    Manage My Medicine       Timeframe:  Long-Range Goal Priority:  High Start Date:                             Expected End Date:                       Follow Up Date 05/22/2021    - call for medicine refill 2 or 3 days before it runs out - call if I am sick and can't take my medicine - learn to read medicine labels - use a pillbox to sort medicine - use an alarm clock or phone to remind me to take my medicine    Why is this important?   These steps will help you keep on track with your medicines.   Notes:  Please call if you have any questions        Patient Care Plan: CCM Pharmacy Care Plan     Problem Identified: High Triglycerides, SCHIZOPHRENIA, TOBACCO USE   Priority: High     Long-Range Goal: Disease Management   This Visit's Progress: On track  Note:   Current Barriers:  Unable to independently monitor therapeutic efficacy Unable to achieve control of triglycerides    Pharmacist Clinical Goal(s):  Patient will achieve adherence to monitoring guidelines and medication adherence to achieve therapeutic efficacy through collaboration with PharmD and provider.   Interventions: 1:1 collaboration with [name], FNP regarding development and update of comprehensive plan of care as evidenced by provider attestation and co-signature Inter-disciplinary care team collaboration (see longitudinal plan of care) Comprehensive medication review performed; medication list updated in electronic medical record  High Triglycerides: (LDL goal < 100) -Uncontrolled -Current treatment: Not currently taking any medication  -Medications previously tried: none noted   -Current dietary patterns: breakfast  he eats cereal  (cheerios) with milk and eggs. He reports he eats a "Solicitor of breakfast.  Dinner he usually eats fast food: steak and cheese sub, McDonalds - McDouble, small fry and large sweet tea, or taco bell-some type of beef burrito with a chicken soft taco. Patient reports interest in eating healthy but the cost is too high for healthy food.  -Current exercise habits: Patient reports walking and doing exercises at home from time to time like push ups. He is walking twice a week, sometimes he walks to the store which is twenty minutes away and other times he walks up and down the block.  -Educated on Cholesterol goals;  Importance of limiting foods high in cholesterol; Exercise goal of 150 minutes per week; -Counseled on diet and exercise extensively  -Collaborate with patient to help with food pantry options including the [employer] (Goal: reduce symptoms) -Controlled -Current treatment: Divalproex 500 mg DR - taking 1 tablet by mouth at bedtime  Risperidone 3 mg by mouth two times daily  -Patient is able to handle the voices -Patient reports he is open to any type of counseling available.  -He has a NP that he sees from [place] with [employer] for mental health support -Educated on Benefits of medication for symptom control Benefits of cognitive-behavioral therapy with or without  medication -Recommended to continue current medication  Tobacco use (Goal: Smoking Cessation ) -Uncontrolled -Previous quit attempts: 2 -Current treatment  Nicotine Patch 21 mg - patient reports that they worked  -Patient smokes After 30 minutes of waking -Patient triggers include: taking a work break and seeing someone else smoke,  stress and boredom  and smelling cigarette smoke -On a scale of 1-10, reports MOTIVATION to quit is 5 -On a scale of 1-10, reports CONFIDENCE in quitting is 8 -Fagerstrom Test Score: 8  -Patient reports that he smokes Black and Mild Cigars  -Provided contact information for [place] Quit Line (1-800-QUIT-NOW) and encouraged  patient to reach out to this group for support. -Recommended patient think about quit smoking again  Health Maintenance -Vaccine gaps: Pneumonia Vaccine  -Recommended to continue current medication  Patient Goals/Self-Care Activities Patient will:  - take medications as prescribed  Follow Up Plan: The patient has been provided with contact information for the care management team and has been advised to call with any health related questions or concerns.         Mr. Guglielmo was given information about Chronic Care Management services today including:  CCM service includes personalized support from designated clinical staff supervised by his physician, including individualized plan of care and coordination with other care providers 24/7 contact phone numbers for assistance for urgent and routine care needs. Standard insurance, coinsurance, copays and deductibles apply for chronic care management only during months in which we provide at least 20 minutes of these services. Most insurances cover these services at 100%, however patients may be responsible for any copay, coinsurance and/or deductible if applicable. This service may help you avoid the need for more expensive face-to-face services. Only one practitioner may furnish and bill the service in a calendar month. The patient may stop CCM services at any time (effective at the end of the month) by phone call to the office staff.  Patient agreed to services and verbal consent obtained.   The patient verbalized understanding of instructions, educational materials, and care plan provided today and agreed to receive a mailed copy of patient instructions, educational materials, and care plan.   [name], PharmD Clinical Pharmacist [facility] [phone]

## 2021-01-28 ENCOUNTER — Telehealth: Payer: Medicare (Managed Care)

## 2021-01-28 ENCOUNTER — Telehealth: Payer: Self-pay

## 2021-01-28 NOTE — Telephone Encounter (Signed)
  Care Management   Follow Up Note   01/28/2021 Name: Scott Farrell MRN: [id number] DOB: 03-20-1981   Referred by: [name], FNP Reason for referral : Chronic Care Management (Initial RN CM Outreach )   An unsuccessful telephone outreach was attempted today. The patient was referred to the case management team for assistance with care management and care coordination.   Follow Up Plan: A HIPPA compliant phone message was left for the patient providing contact information and requesting a return call.   Barb Merino, RN, BSN, CCM Care Management Coordinator [place] [facility]  Direct Phone: [phone]

## 2021-02-04 ENCOUNTER — Telehealth: Payer: Self-pay

## 2021-02-04 NOTE — Chronic Care Management (AMB) (Signed)
    Chronic Care Management Pharmacy Assistant   Name: Scott Farrell  MRN: [id number] DOB: 1980-11-04   Reason for Encounter: Disease State/ Tobacco use   Recent office visits:  None  Recent consult visits:  None  Hospital visits:  Medication Reconciliation was completed by comparing discharge summary, patient's EMR and Pharmacy list, and upon discussion with patient.   Admitted to the hospital on 08-12-2020 due to chest tightness. Discharge date was 08-13-2020. Discharged from [place]?Medications Started at [facility] Discharge:?? None   Medication Changes at Hospital Discharge: None   Medications Discontinued at Hospital Discharge: None   Medications that remain the same after Hospital Discharge:??  -All other medications will remain the same.      Medications: Outpatient Encounter Medications as of 02/04/2021  Medication Sig   divalproex (DEPAKOTE) 500 MG DR tablet Take 500 mg by mouth at bedtime.   risperiDONE (RISPERDAL) 3 MG tablet Take 3 mg by mouth 2 (two) times daily.   No facility-administered encounter medications on file as of 02/04/2021.   Patient states he smokes 10 cigarettes daily. Patient states he feels very tired from not sleeping at night still.   Care Gaps: Colonoscopy overdue Flu vaccine overdue Pneumococcal overdue Last medicare wellness 07-03-2020  Star Rating Drugs: None  Jeannette How [facility] Clinical Pharmacist Assistant [phone]

## 2021-02-05 ENCOUNTER — Encounter: Payer: Self-pay | Admitting: Nurse Practitioner

## 2021-02-05 ENCOUNTER — Telehealth (INDEPENDENT_AMBULATORY_CARE_PROVIDER_SITE_OTHER): Payer: Medicare (Managed Care) | Admitting: Nurse Practitioner

## 2021-02-05 VITALS — Ht 73.0 in | Wt 245.0 lb

## 2021-02-05 DIAGNOSIS — R059 Cough, unspecified: Secondary | ICD-10-CM

## 2021-02-05 DIAGNOSIS — U071 COVID-19: Secondary | ICD-10-CM

## 2021-02-05 DIAGNOSIS — R197 Diarrhea, unspecified: Secondary | ICD-10-CM

## 2021-02-05 DIAGNOSIS — R5383 Other fatigue: Secondary | ICD-10-CM | POA: Diagnosis not present

## 2021-02-05 LAB — POC COVID19 BINAXNOW: SARS Coronavirus 2 Ag: NEGATIVE

## 2021-02-05 MED ORDER — GUAIFENESIN-DM 100-10 MG/5ML PO SYRP: 5.0000 mL | ORAL_SOLUTION | ORAL | 0 refills | Status: DC | PRN

## 2021-02-05 MED ORDER — BENZONATATE 100 MG PO CAPS: 100.0000 mg | ORAL_CAPSULE | Freq: Three times a day (TID) | ORAL | 1 refills | Status: DC | PRN

## 2021-02-05 NOTE — Patient Instructions (Signed)
Diarrhea, Adult Diarrhea is when you pass loose and watery poop (stool) often. Diarrhea can make you feel weak and cause you to lose water in your body (get dehydrated). Losing water in your body can cause you to: Feel tired and thirsty. Have a dry mouth. Go pee (urinate) less often. Diarrhea often lasts 2-3 days. However, it can last longer if it is a sign of something more serious. It is important to treat your diarrhea as told by your doctor. Follow these instructions at home: Eating and drinking   Follow these instructions as told by your doctor: Take an ORS (oral rehydration solution). This is a drink that helps you replace fluids and minerals your body lost. It is sold at pharmacies and stores. Drink plenty of fluids, such as: Water. Ice chips. Diluted fruit juice. Low-calorie sports drinks. Milk, if you want. Avoid drinking fluids that have a lot of sugar or caffeine in them. Eat bland, easy-to-digest foods in small amounts as you are able. These foods include: Bananas. Applesauce. Rice. Low-fat (lean) meats. Toast. Crackers. Avoid alcohol. Avoid spicy or fatty foods.  Medicines Take over-the-counter and prescription medicines only as told by your doctor. If you were prescribed an antibiotic medicine, take it as told by your doctor. Do not stop using the antibiotic even if you start to feel better. General instructions  Wash your hands often using soap and water. If soap and water are not available, use a hand sanitizer. Others in your home should wash their hands as well. Hands should be washed: After using the toilet or changing a diaper. Before preparing, cooking, or serving food. While caring for a sick person. While visiting someone in a hospital. Drink enough fluid to keep your pee (urine) pale yellow. Rest at home while you get better. Watch your condition for any changes. Take a warm bath to help with any burning or pain from having diarrhea. Keep all  follow-up visits as told by your doctor. This is important. Contact a doctor if: You have a fever. Your diarrhea gets worse. You have new symptoms. You cannot keep fluids down. You feel light-headed or dizzy. You have a headache. You have muscle cramps. Get help right away if: You have chest pain. You feel very weak or you pass out (faint). You have bloody or black poop or poop that looks like tar. You have very bad pain, cramping, or bloating in your belly (abdomen). You have trouble breathing or you are breathing very quickly. Your heart is beating very quickly. Your skin feels cold and clammy. You feel confused. You have signs of losing too much water in your body, such as: Dark pee, very little pee, or no pee. Cracked lips. Dry mouth. Sunken eyes. Sleepiness. Weakness. Summary Diarrhea is when you pass loose and watery poop (stool) often. Diarrhea can make you feel weak and cause you to lose water in your body (get dehydrated). Take an ORS (oral rehydration solution). This is a drink that is sold at pharmacies and stores. Eat bland, easy-to-digest foods in small amounts as you are able. Contact a doctor if your condition gets worse. Get help right away if you have signs that you have lost too much water in your body. This information is not intended to replace advice given to you by your health care provider. Make sure you discuss any questions you have with your health care provider. Document Revised: 10/22/2017 Document Reviewed: 10/22/2017 Elsevier Patient Education  2022 Elsevier Inc.  

## 2021-02-05 NOTE — Progress Notes (Signed)
Virtual Visit via 100% video    This visit type was conducted due to national recommendations for restrictions regarding the COVID-19 Pandemic (e.g. social distancing) in an effort to limit this patient's exposure and mitigate transmission in our community.  Due to his co-morbid illnesses, this patient is at least at moderate risk for complications without adequate follow up.  This format is felt to be most appropriate for this patient at this time.  All issues noted in this document were discussed and addressed.  A limited physical exam was performed with this format.    This visit type was conducted due to national recommendations for restrictions regarding the COVID-19 Pandemic (e.g. social distancing) in an effort to limit this patient's exposure and mitigate transmission in our community.  Patients identity confirmed using two different identifiers.  This format is felt to be most appropriate for this patient at this time.  All issues noted in this document were discussed and addressed.  No physical exam was performed (except for noted visual exam findings with Video Visits).    Date:  02/05/2021   ID:  Scott Farrell, DOB [birth year], MRN [id number]  Patient Location:  Home   Provider location:   Office  Chief Complaint:  Cough, congestion, diarrhea, no fever. Fatigue for the past couple of days   History of Present Illness:    Scott Farrell is a 40 y.o. male who presents via video conferencing for a telehealth visit today.    The patient does have symptoms concerning for COVID-19 infection (fever, chills, cough, or new shortness of breath).   He is having diarrhea 3/5 days. He is very fatigue. He is congested. Has to blow his nose. These symptoms started last Thursday. Fatigue and congestion started Monday. Cough.Congestion. He is going to come in this afternoon to get tested for covid. He has not tested himself at home.   Diarrhea  Associated symptoms include coughing  and myalgias. Pertinent negatives include no chills, fever or headaches.    Past Medical History:  Diagnosis Date   Clotting disorder [facility])    personal hx/o? etiology unclear   Costochondritis    history of   Herpes genitalia 1/12   History of echocardiogram 01/07/11   normal LV function, EF 60%, trace mitral, tricuspid and pulmonic regurgitation; Dr. Tollie Eth   IBS (irritable bowel syndrome)    Mental disorder 2013   hospitalized for psychiatric illness prior   Normal cardiac stress test 01/07/11   negative treadmill test, no evidence of ischemia, good exercise capacity; Tollie Eth, MD;    Prostatitis 11/2010   Dr. Karsten Ro   Renal stone 06/2012   Urology consult   Schizophrenia [facility])    [facility], [facility] - Dr. Lennon Alstrom   Tobacco use disorder    Past Surgical History:  Procedure Laterality Date   EUS  06/30/2012   Procedure: UPPER ENDOSCOPIC ULTRASOUND (EUS) LINEAR;  Surgeon: Milus Banister, MD;  Location: [facility] ENDOSCOPY;  Service: Endoscopy;  Laterality: N/A;  radial linear   WISDOM TOOTH EXTRACTION       Current Meds  Medication Sig   divalproex (DEPAKOTE) 500 MG DR tablet Take 500 mg by mouth at bedtime.   risperiDONE (RISPERDAL) 3 MG tablet Take 3 mg by mouth 2 (two) times daily.     Allergies:   Metformin and related   Social History   Tobacco Use   Smoking status: Some Days    Packs/day: 0.25    Years: 23.00  Pack years: 5.75    Types: Cigars, Cigarettes   Smokeless tobacco: Never   Tobacco comments:    1-2 black milds daily; started back smoking cigarettes.   Vaping Use   Vaping Use: Former   Substances: Nicotine  Substance Use Topics   Alcohol use: Yes    Comment: occ   Drug use: Not Currently     Family Hx: The patient's family history includes Acne in his mother; Acute lymphoblastic leukemia in his father; Breast cancer in his maternal aunt; Cancer in his brother; Colon cancer (age of onset: 41) in his mother; Depression in his  mother; Diabetes in his father and mother; Hypertension in his mother; Ovarian cancer in his maternal aunt; Stroke in his mother; Thyroid disease in his mother.  ROS:   Please see the history of present illness.    Review of Systems  Constitutional:  Positive for malaise/fatigue. Negative for chills and fever.  HENT:  Positive for congestion.   Respiratory:  Positive for cough. Negative for shortness of breath and wheezing.   Cardiovascular:  Negative for chest pain and claudication.  Gastrointestinal:  Positive for diarrhea. Negative for constipation.  Musculoskeletal:  Positive for myalgias.  Neurological:  Negative for headaches.   All other systems reviewed and are negative.   Labs/Other Tests and Data Reviewed:    Recent Labs: 05/27/2020: ALT 19 08/12/2020: Hemoglobin 14.1; Platelets 170 09/03/2020: BUN 12; Creatinine, Ser 1.04; Potassium 4.7; Sodium 141   Recent Lipid Panel Lab Results  Component Value Date/Time   CHOL 142 12/16/2020 12:55 PM   TRIG 164 (H) 12/16/2020 12:55 PM   HDL 27 (L) 12/16/2020 12:55 PM   CHOLHDL 5.3 (H) 12/16/2020 12:55 PM   CHOLHDL 3.9 07/22/2012 08:21 AM   LDLCALC 86 12/16/2020 12:55 PM    Wt Readings from Last 3 Encounters:  02/05/21 245 lb (111.1 kg)  12/16/20 246 lb 3.2 oz (111.7 kg)  10/31/20 245 lb (111.1 kg)     Exam:    Vital Signs:  Ht '6\' 1"'$  (1.854 m)   Wt 245 lb (111.1 kg)   BMI 32.32 kg/m     Physical Exam Constitutional:      Appearance: Normal appearance.  HENT:     Head: Normocephalic and atraumatic.  Cardiovascular:     Rate and Rhythm: Normal rate and regular rhythm.  Neurological:     Mental Status: He is alert.    ASSESSMENT & PLAN:    1. COVID - Novel Coronavirus, NAA ([facility]) - POC COVID-19  2. Diarrhea, unspecified type - Novel Coronavirus, NAA ([facility]) - POC COVID-19 -Advised patient to take a lot of fluids and make sure he is drinking plenty of water.  -if her symptoms get worse, he is advised to  go to the urgent care or emergency room.  -Patient was unable to come inside for lab work due to covid symptoms.   3. Fatigue, unspecified type - Novel Coronavirus, NAA ([facility]) - POC COVID-19  4. Cough - benzonatate (TESSALON PERLES) 100 MG capsule; Take 1 capsule (100 mg total) by mouth 3 (three) times daily as needed for cough.  Dispense: 30 capsule; Refill: 1 - guaiFENesin-dextromethorphan (ROBITUSSIN DM) 100-10 MG/5ML syrup; Take 5 mLs by mouth every 4 (four) hours as needed for cough.  Dispense: 118 mL; Refill: 0  The patient was encouraged to call or send a message through [place] for any questions or concerns.   Follow up: if symptoms persist or do not get better.   Advised  patient to take Vitamin C, D, Zinc.  Keep yourself hydrated with a lot of water and rest. Take Delsym for cough and Mucinex as need. Take Tylenol or pain reliever every 4-6 hours as needed for pain/fever/body ache. If you have elevated blood pressure, you can take OTC Corcidin. You can also take OTC oscillococcinum to help with your symptoms.  Educated patient if symptoms get worse or if she experiences any SOB, chest pain or pain in her legs to seek immediate emergency care. Continue to monitor your oxygen levels. Call us if you have any questions. Quarantine for 5 days if tested positive and no symptoms or 10 days if tested positive and have symptoms. Wear a mask around other people.   Staying healthy and adopting a healthy lifestyle for your overall health is important. You should eat 7 or more servings of fruits and vegetables per day. You should drink plenty of water to keep yourself hydrated and your kidneys healthy. This includes about 65-80+ fluid ounces of water. Limit your intake of animal fats especially for elevated cholesterol. Avoid highly processed food and limit your salt intake if you have hypertension. Avoid foods high in saturated/Trans fats. Along with a healthy diet it is also very important to  maintain time for yourself to maintain a healthy mental health with low stress levels. You should get atleast 150 min of moderate intensity exercise weekly for a healthy heart. Along with eating right and exercising, aim for at least 7-9 hours of sleep daily.  Eat more whole grains which includes barley, wheat berries, oats, brown rice and whole wheat pasta. Use healthy plant oils which include olive, soy, corn, sunflower and peanut. Limit your caffeine and sugary drinks. Limit your intake of fast foods. Limit milk and dairy products to one or two daily servings.    COVID-19 Education: The signs and symptoms of COVID-19 were discussed with the patient and how to seek care for testing (follow up with PCP or arrange E-visit).  The importance of social distancing was discussed today.  Patient Risk:   After full review of this patients clinical status, I feel that they are at least moderate risk at this time.  Time:   Today, I have spent 20 minutes/ seconds with the patient with telehealth technology discussing above diagnoses.     Medication Adjustments/Labs and Tests Ordered: Current medicines are reviewed at length with the patient today.  Concerns regarding medicines are outlined above.   Tests Ordered: Orders Placed This Encounter  Procedures   Novel Coronavirus, NAA ([facility])   POC COVID-19     Medication Changes: No orders of the defined types were placed in this encounter.   Disposition:  Follow up as needed.   Signed, Bary Castilla, NP

## 2021-02-06 LAB — NOVEL CORONAVIRUS, NAA: SARS-CoV-2, NAA: NOT DETECTED

## 2021-02-06 LAB — SARS-COV-2, NAA 2 DAY TAT

## 2021-02-12 ENCOUNTER — Telehealth: Payer: Medicare (Managed Care)

## 2021-02-12 ENCOUNTER — Telehealth: Payer: Self-pay

## 2021-02-12 NOTE — Telephone Encounter (Signed)
  Care Management   Follow Up Note   02/12/2021 Name: Scott Farrell MRN: [id number] DOB: Aug 07, 1980   Referred by: [name], FNP Reason for referral : Chronic Care Management (Unsuccessful call)   An unsuccessful telephone outreach was attempted today. The patient was referred to the case management team for assistance with care management and care coordination. SW left a HIPAA compliant voice message requesting a return call.  Follow Up Plan: The care management team will reach out to the patient again over the next 30 days.   [name], BSW, CDP Social Worker, Certified Dementia Practitioner [place] [facility] [phone]

## 2021-02-17 ENCOUNTER — Telehealth: Payer: Medicare (Managed Care)

## 2021-02-17 ENCOUNTER — Ambulatory Visit (INDEPENDENT_AMBULATORY_CARE_PROVIDER_SITE_OTHER): Payer: Medicare (Managed Care)

## 2021-02-17 DIAGNOSIS — Z720 Tobacco use: Secondary | ICD-10-CM

## 2021-02-17 DIAGNOSIS — R7303 Prediabetes: Secondary | ICD-10-CM

## 2021-02-17 DIAGNOSIS — E781 Pure hyperglyceridemia: Secondary | ICD-10-CM

## 2021-02-19 NOTE — Chronic Care Management (AMB) (Signed)
Chronic Care Management   CCM RN Visit Note  02/17/2021 Name: Scott Farrell MRN: [id number] DOB: 1981-04-20  Subjective: Scott Farrell is a 40 y.o. year old male who is a primary care patient of [name], [place]. The care management team was consulted for assistance with disease management and care coordination needs.    Engaged with patient by telephone for initial visit in response to provider referral for case management and/or care coordination services.   Consent to Services:  The patient was given information about Chronic Care Management services, agreed to services, and gave verbal consent prior to initiation of services.  Please see initial visit note for detailed documentation.   Patient agreed to services and verbal consent obtained.   Assessment: Review of patient past medical history, allergies, medications, health status, including review of consultants reports, laboratory and other test data, was performed as part of comprehensive evaluation and provision of chronic care management services.   SDOH (Social Determinants of Health) assessments and interventions performed:    CCM Care Plan  Allergies  Allergen Reactions   Metformin And Related Other (See Comments)    Chest tightness    Outpatient Encounter Medications as of 02/17/2021  Medication Sig   benzonatate (TESSALON PERLES) 100 MG capsule Take 1 capsule (100 mg total) by mouth 3 (three) times daily as needed for cough.   divalproex (DEPAKOTE) 500 MG DR tablet Take 500 mg by mouth at bedtime.   guaiFENesin-dextromethorphan (ROBITUSSIN DM) 100-10 MG/5ML syrup Take 5 mLs by mouth every 4 (four) hours as needed for cough.   risperiDONE (RISPERDAL) 3 MG tablet Take 3 mg by mouth 2 (two) times daily.   No facility-administered encounter medications on file as of 02/17/2021.    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    Conditions to be addressed/monitored: Prediabetes, High Triglycerides, Tobacco Use  Care Plan : Assist with Chronic Care Management and Care Coordination needs  Updates made by Lynne Logan, RN since 02/19/2021 12:00 AM  Completed 02/19/2021   Problem: Assist with Chronic Care Management and Care Coordination needs Resolved 02/17/2021  Priority: High     Goal: Assist with Chronic Care Management and Care Coordination needs Completed 02/17/2021  Start Date: 12/23/2020  Expected End Date: 01/29/2021  Recent Progress: On track  Priority: High  Note:   Current Barriers:  Chronic Disease Management support, education, chronic care management and care coordination needs related to Prediabetes, High Triglycerides with RNCM, SW and Pharmacy Care Management and Care coordination needs Case Manager Clinical Goal(s):  Patient will work with the CCM team to address needs related to chronic care management and care coordination needs related to Prediabetes, High Triglycerides with RNCM, SW and Pharmacy Care Management and Care Coordination needs Interventions:  Collaborated with BSW to initiate plan of care to address needs related to chronic care management and care coordination needs related to with Prediabetes, High Triglycerides with RNCM, SW and Pharmacy Care Management and Care Coordination needs Collaboration with Glendale Chard, MD regarding development and update of comprehensive plan of care as evidenced by provider attestation and co-signature Inter-disciplinary  care team collaboration (see longitudinal plan of care) Patient Goals/Self-Care Activities patient will:   -  Patient will work with the CCM team to address chronic care management and care coordination needs and will continue to work with the clinical team to address health care and disease management related needs.   Follow Up Plan: The care management team will reach out to the patient again over the next 30-45 days.      Care Plan : Prediabetes  Updates made by Lynne Logan, RN since 02/19/2021 12:00 AM     Problem: Disease Progression (Prediabetes)   Priority: Medium     Long-Range Goal: Disease Progression Prevented or Minimized   Start Date: 02/17/2021  Expected End Date: 02/17/2022  This Visit's Progress: On track  Priority: Medium  Note:   Objective:  Lab Results  Component Value Date   HGBA1C 5.7 (H) 09/03/2020   Lab Results  Component Value Date   CREATININE 1.04 09/03/2020   CREATININE 1.03 08/12/2020   CREATININE 1.07 05/27/2020   Lab Results  Component Value Date   EGFR 93 09/03/2020  Current Barriers:  Knowledge Deficits related to basic Diabetes pathophysiology and self care/management Knowledge Deficits related to medications used for management of diabetes Case Manager Clinical Goal(s):  patient will demonstrate improved adherence to prescribed treatment plan for diabetes self care/management as evidenced by: adherence to ADA/ carb modified diet exercise 5 days/week adherence to prescribed medication regimen contacting provider for new or worsened symptoms or questions Interventions:  02/17/21 completed successful outbound call with patient Collaboration with [name], [place] regarding development and update of comprehensive plan of care as evidenced by provider attestation and co-signature Inter-disciplinary care team collaboration (see longitudinal plan of care) Provided education to patient about basic DM disease process Review of patient status, including review of consultant's reports, relevant laboratory and other test results, and medications  completed. Reviewed medications with patient and discussed importance of medication adherence Educated patient on dietary and exercise recommendations  Mailed patient printed educational materials related to Prediabetes, Low Carb Smoothies, Carb Choice list  Discussed plans with patient for ongoing care management follow up and provided patient with direct contact information for care management team Self-Care Activities Self administers oral medications as prescribed Attends all scheduled provider appointments Adheres to prescribed ADA/carb modified Patient Goals: - drink 6 to 8 glasses of water each day - manage portion size - learn more about diabetes  Follow Up Plan: Telephone follow up appointment with care management team member scheduled for: 06/03/21    Care Plan : Elevated Triglycerides  Updates made by Lynne Logan, RN since 02/19/2021 12:00 AM     Problem: Elevated Tryiglycerides   Priority: High     Long-Range Goal: Elevated Triglycerides complications prevented or minimized   Start Date: 02/17/2021  Expected End Date: 02/17/2022  This Visit's Progress: On track  Priority: Medium  Note:   Current Barriers:  Ineffective Self Health Maintenance in a patient with  Prediabetes, High Triglycerides, Tobacco Use Clinical Goal(s):  Collaboration with [name], FNP regarding development and update of comprehensive plan of care as evidenced by provider attestation and co-signature Inter-disciplinary care team collaboration (see longitudinal plan of care) patient will work with care management team to address care coordination and chronic disease management needs related to Disease Management Educational Needs Care Coordination Medication Management and Education Medication Reconciliation Psychosocial Support   Interventions:  02/17/21 completed successful outbound call with patient  Evaluation of current treatment plan related to  Elevated Triglycerides ,  self-management and patient's adherence to plan as established by provider. Collaboration with [name], FNP regarding development and update of comprehensive plan of care as evidenced by provider attestation       and co-signature Inter-disciplinary care team collaboration (see longitudinal plan of care) Provided education to patient about basic disease process related to Elevated Triglycerides Review of patient status, including review of consultant's reports, relevant laboratory and other test results, and medications completed. Reviewed medications with patient and discussed importance of medication adherence Educated on dietary and exercise recommendations Mailed printed educational materials related to Triglycerides; Cooking to lower Cholesterol  Discussed plans with patient for ongoing care management follow up and provided patient with direct contact information for care management team Self Care Activities:  Self administers medications as prescribed Attends all scheduled provider appointments Calls pharmacy for medication refills Calls provider office for new concerns or questions Patient Goals: - adhere to dietary and exercise recommendations   Follow Up Plan: Telephone follow up appointment with care management team member scheduled for: 06/03/21     Care Plan : Smoking Cessation  Updates made by Lynne Logan, RN since 02/19/2021 12:00 AM     Problem: Smoking Cessation   Priority: High     Long-Range Goal: Smoking Cessation   Start Date: 02/17/2021  Expected End Date: 02/17/2022  This Visit's Progress: On track  Priority: High  Note:   Current Barriers:  Ineffective Self Health Maintenance in a patient with Prediabetes, High Triglycerides, Tobacco Use Clinical Goal(s):  Collaboration with [name], FNP regarding development and update of comprehensive plan of care as evidenced by provider attestation and co-signature Inter-disciplinary care team  collaboration (see longitudinal plan of care) patient will work with care management team to address care coordination and chronic disease management needs related to Disease Management Educational Needs Care Coordination Medication Management and Education Medication Reconciliation Psychosocial Support   Interventions:  02/17/21 completed successful outbound call with patient  Evaluation of current treatment plan related to  tobacco abuse , self-management and patient's adherence to plan as established by provider. Collaboration with [name], FNP regarding development and update of comprehensive plan of care as evidenced by provider attestation       and co-signature Inter-disciplinary care team collaboration (see longitudinal plan of care) Discussed patient smokes about 1 pack of cigarettes over 1-2 weeks due to cost Determined patient has tried Nicotine patches in the past with minimal effectiveness, he would like to try something else  Determined patient spoke with the embedded Pharm D, she did not make any recommendations Sent in basket message to PCP regarding additional recommendations to help patient with smoking cessation  Discussed plans with patient for ongoing care management follow up and provided patient with direct contact information for care management team Self Care Activities:  Self administers medications as prescribed Attends all scheduled provider appointments Calls pharmacy for medication refills Calls provider office for new concerns or questions Patient Goals: - work with embedded Pharm D and PCP to determined best pharmacological treatment to help with tobacco cessation   Follow Up Plan: Telephone follow up appointment with care management team member scheduled for:  06/03/21    Plan:Telephone follow up appointment with care management team member scheduled for:  06/03/21  Barb Merino, RN, BSN, CCM Care Management Coordinator [facility]  Direct Phone: [phone]

## 2021-02-19 NOTE — Patient Instructions (Signed)
Visit Information   PATIENT GOALS:   Goals Addressed      COMPLETED: Assist with Chronic Care Management and Care Coordination needs       Timeframe:  Short-Term Goal Priority:  High Start Date:  12/23/20                           Expected End Date:  01/29/21    Initial RN CM Outreach: 01/16/21  Patient Goals/Self-Care Activities patient will:   - Patient will work with the CCM team to address chronic care management and care coordination needs and will continue to work with the clinical team to address health care and disease management related needs.   Follow Up Plan: The care management team will reach out to the patient again over the next 30-45 days.                         Diabetes disease progression prevented or minimized   On track    Timeframe:  Long-Range Goal Priority:  Medium Start Date: 02/17/21                            Expected End Date: 02/17/22                      Follow Up Date: 06/03/21   Patient Goals: - drink 6 to 8 glasses of water each day - manage portion size - learn more about diabetes    Why is this important?   Checking your blood sugar at home helps to keep it from getting very high or very low.  Writing the results in a diary or log helps the doctor know how to care for you.  Your blood sugar log should have the time, date and the results.  Also, write down the amount of insulin or other medicine that you take.  Other information, like what you ate, exercise done and how you were feeling, will also be helpful.     Notes:      Elevated Triglycerides complications prevented or minimized   On track    Timeframe:  Long-Range Goal Priority:  High Start Date:  02/17/21                           Expected End Date: 02/17/22   Next Scheduled Follow up date: 06/03/21    Patient Goals: - adhere to dietary and exercise recommendations                        Smoking Cessation   On track    Timeframe:  Long-Range Goal Priority:  High Start Date: 02/17/21                             Expected End Date: 02/17/22  Next Scheduled Follow up: 06/03/21    Patient Goals: - work with embedded Pharm D and PCP to determined best pharmacological treatment to help with tobacco cessation                            Consent to CCM Services: Scott Farrell was given information about Chronic Care Management services including:  CCM service includes personalized support from designated clinical staff supervised  by his physician, including individualized plan of care and coordination with other care providers 24/7 contact phone numbers for assistance for urgent and routine care needs. Service will only be billed when office clinical staff spend 20 minutes or more in a month to coordinate care. Only one practitioner may furnish and bill the service in a calendar month. The patient may stop CCM services at any time (effective at the end of the month) by phone call to the office staff. The patient will be responsible for cost sharing (co-pay) of up to 20% of the service fee (after annual deductible is met).  Patient agreed to services and verbal consent obtained.   The patient verbalized understanding of instructions, educational materials, and care plan provided today and declined offer to receive copy of patient instructions, educational materials, and care plan.   Telephone follow up appointment with care management team member scheduled for: 06/03/21  Barb Merino, RN, BSN, CCM Care Management Coordinator [place] [facility]  Direct Phone: [phone]    CLINICAL CARE PLAN: Patient Care Plan: Assist with Chronic Care Management and Care Coordination needs  Completed 02/19/2021   Problem Identified: Assist with Chronic Care Management and Care Coordination needs Resolved 02/17/2021  Priority: High     Goal: Assist with Chronic Care Management and Care Coordination needs Completed 02/17/2021  Start Date: 12/23/2020   Expected End Date: 01/29/2021  Recent Progress: On track  Priority: High  Note:   Current Barriers:  Chronic Disease Management support, education, chronic care management and care coordination needs related to Prediabetes, High Triglycerides with RNCM, SW and Pharmacy Care Management and Care coordination needs Case Manager Clinical Goal(s):  Patient will work with the CCM team to address needs related to chronic care management and care coordination needs related to Prediabetes, High Triglycerides with RNCM, SW and Pharmacy Care Management and Care Coordination needs Interventions:  Collaborated with BSW to initiate plan of care to address needs related to chronic care management and care coordination needs related to with Prediabetes, High Triglycerides with RNCM, SW and Pharmacy Care Management and Care Coordination needs Collaboration with Glendale Chard, MD regarding development and update of comprehensive plan of care as evidenced by provider attestation and co-signature Inter-disciplinary care team collaboration (see longitudinal plan of care) Patient Goals/Self-Care Activities patient will:   - Patient will work with the CCM team to address chronic care management and care coordination needs and will continue to work with the clinical team to address health care and disease management related needs.   Follow Up Plan: The care management team will reach out to the patient again over the next 30-45 days.      Patient Care Plan: Social Work [facility] Care Plan     Problem Identified: Disease Management Resolved 01/03/2021     Goal: Follow up with mental health provider concerning reported symptoms of hearing voices Completed 01/03/2021  Start Date: 12/23/2020  Expected End Date: 01/22/2021  Priority: High  Note:   Current Barriers:  Chronic disease management support and education needs related to  Pre-Diabetes and High Triglycerides   History of Schizophrenia Ineffective medication  regimen as evidenced by self-reported auditory hallucinations  Social Worker Clinical Goal(s):  patient will work with SW to identify and address any acute and/or chronic care coordination needs related to the self health management of  pre diabetes, high triglycerides, and Schizophrenia   Patient will increase mental health medications as instructed by [facility] provider [name]  Patient will dial  911, contact his mental health provider, or visit [facility] if mental health symptoms persist or if the patient begins to feel unsafe  SW Interventions:  Inter-disciplinary care team collaboration (see longitudinal plan of care) Collaboration with [name], FNP regarding development and update of comprehensive plan of care as evidenced by provider attestation and co-signature Successful outbound call placed to the patient to assess goal progression Determined the patient has increased his medications as directed by his mental health provider Discussed the patient is no longer hearing voices but is feeling more tired with the higher dose Encouraged the patient to speak with his provider as needed regarding medication concerns   Patient Goals/Self-Care Activities patient will:   - Patient will self administer medications as prescribed -Contact mental health provider as needed  -Dial 911 if he begins to feel unsafe     Problem Identified: Quality of Life (General Plan of Care)      Long-Range Goal: Obtain better understanding of Community Resources   Start Date: 01/03/2021  Expected End Date: 04/03/2021  Priority: Medium  Note:   Current Barriers:  Chronic disease management support and education needs related to  Pre-diabetes and high triglycerides   Limited knowledge of community resources to assist with financial burden related to the cost of food, credit card debt, home repair and utility costs  Social Worker Clinical Goal(s):  patient will work with SW to  identify and address any acute and/or chronic care coordination needs related to the self health management of pre-diabetes and high triglycerides  Patient will work with SW to become more knowledgeable of community resources  SW Interventions:  Inter-disciplinary care team collaboration (see longitudinal plan of care) Collaboration with [name], [place] regarding development and update of comprehensive plan of care as evidenced by provider attestation and co-signature Successful outbound call placed to the patient to conduct an [facility] screen Determined the patient is in need of resources to assist with the cost of food, credit card debt, home repair, and utility costs Discussed the patient lives with his mother who he believes owns the home Patient reports the upstairs bathtub leaks and you can see an outline in the ceiling of the tup from the downstairs Educated the patient on programs including [employer] and [employer] information to the patients home advising his mother  would need to contact resources as the home is in her name Discussed the patient occasionally has difficulty affording the cost of food Provided verbal and written education on Out of the [employer] Patients reports difficulty affording utilities at times Educated the patient on the [employer] program offered through DSS to assist with heating costs Mailed the patient information on this program Patient requests resources to help get out of credit card debt Advised the patient SW would mail information on credit counseling offered by [employer] of the [employer] Scheduled follow up call over the next 45 days  Patient Goals/Self-Care Activities patient will:   -  Review mailed resources -Sport and exercise psychologist SW as needed  Follow Up Plan:  SW will follow up with the patient over the next 45 days     Patient Care Plan: CCM Pharmacy Care Plan     Problem Identified: High  Triglycerides, SCHIZOPHRENIA, TOBACCO USE   Priority: High     Long-Range Goal: Disease Management   This Visit's Progress: On track  Note:   Current Barriers:  Unable to independently monitor therapeutic efficacy Unable to achieve control  of triglycerides    Pharmacist Clinical Goal(s):  Patient will achieve adherence to monitoring guidelines and medication adherence to achieve therapeutic efficacy through collaboration with PharmD and provider.   Interventions: 1:1 collaboration with [name], FNP regarding development and update of comprehensive plan of care as evidenced by provider attestation and co-signature Inter-disciplinary care team collaboration (see longitudinal plan of care) Comprehensive medication review performed; medication list updated in electronic medical record  High Triglycerides: (LDL goal < 100) -Uncontrolled -Current treatment: Not currently taking any medication  -Medications previously tried: none noted   -Current dietary patterns: breakfast  he eats cereal  (cheerios) with milk and eggs. He reports he eats a "Solicitor of breakfast. Dinner he usually eats fast food: steak and cheese sub, [employer]onalds - McDouble, small fry and large sweet tea, or taco bell-some type of beef burrito with a chicken soft taco. Patient reports interest in eating healthy but the cost is too high for healthy food.  -Current exercise habits: Patient reports walking and doing exercises at home from time to time like push ups. He is walking twice a week, sometimes he walks to the store which is twenty minutes away and other times he walks up and down the block.  -Educated on Cholesterol goals;  Importance of limiting foods high in cholesterol; Exercise goal of 150 minutes per week; -Counseled on diet and exercise extensively  -Collaborate with patient to help with food pantry options including the [employer] (Goal: reduce  symptoms) -Controlled -Current treatment: Divalproex 500 mg DR - taking 1 tablet by mouth at bedtime  Risperidone 3 mg by mouth two times daily  -Patient is able to handle the voices -Patient reports he is open to any type of counseling available.  -He has a NP that he sees from [place] with [employer] for mental health support -Educated on Benefits of medication for symptom control Benefits of cognitive-behavioral therapy with or without medication -Recommended to continue current medication  Tobacco use (Goal: Smoking Cessation ) -Uncontrolled -Previous quit attempts: 2 -Current treatment  Nicotine Patch 21 mg - patient reports that they worked  -Patient smokes After 30 minutes of waking -Patient triggers include: taking a work break and seeing someone else smoke,  stress and boredom  and smelling cigarette smoke -On a scale of 1-10, reports MOTIVATION to quit is 5 -On a scale of 1-10, reports CONFIDENCE in quitting is 8 -Fagerstrom Test Score: 8  -Patient reports that he smokes Black and Mild Cigars  -Provided contact information for [place] Quit Line (1-800-QUIT-NOW) and encouraged patient to reach out to this group for support. -Recommended patient think about quit smoking again  Health Maintenance -Vaccine gaps: Pneumonia Vaccine  -Recommended to continue current medication    Patient Goals/Self-Care Activities Patient will:  - take medications as prescribed  Follow Up Plan: The patient has been provided with contact information for the care management team and has been advised to call with any health related questions or concerns.     Patient Care Plan: Prediabetes     Problem Identified: Disease Progression (Prediabetes)   Priority: Medium     Long-Range Goal: Disease Progression Prevented or Minimized   Start Date: 02/17/2021  Expected End Date: 02/17/2022  This Visit's Progress: On track  Priority: Medium  Note:   Objective:  Lab Results  Component  Value Date   HGBA1C 5.7 (H) 09/03/2020   Lab Results  Component Value Date   CREATININE 1.04 09/03/2020  CREATININE 1.03 08/12/2020   CREATININE 1.07 05/27/2020   Lab Results  Component Value Date   EGFR 93 09/03/2020  Current Barriers:  Knowledge Deficits related to basic Diabetes pathophysiology and self care/management Knowledge Deficits related to medications used for management of diabetes Case Manager Clinical Goal(s):  patient will demonstrate improved adherence to prescribed treatment plan for diabetes self care/management as evidenced by: adherence to ADA/ carb modified diet exercise 5 days/week adherence to prescribed medication regimen contacting provider for new or worsened symptoms or questions Interventions:  02/17/21 completed successful outbound call with patient Collaboration with [name], [place] regarding development and update of comprehensive plan of care as evidenced by provider attestation and co-signature Inter-disciplinary care team collaboration (see longitudinal plan of care) Provided education to patient about basic DM disease process Review of patient status, including review of consultant's reports, relevant laboratory and other test results, and medications completed. Reviewed medications with patient and discussed importance of medication adherence Educated patient on dietary and exercise recommendations  Mailed patient printed educational materials related to Prediabetes, Low Carb Smoothies, Carb Choice list  Discussed plans with patient for ongoing care management follow up and provided patient with direct contact information for care management team Self-Care Activities Self administers oral medications as prescribed Attends all scheduled provider appointments Adheres to prescribed ADA/carb modified Patient Goals: - drink 6 to 8 glasses of water each day - manage portion size - learn more about diabetes  Follow Up Plan: Telephone follow up  appointment with care management team member scheduled for: 06/03/21    Patient Care Plan: Elevated Triglycerides     Problem Identified: Elevated Tryiglycerides   Priority: High     Long-Range Goal: Elevated Triglycerides complications prevented or minimized   Start Date: 02/17/2021  Expected End Date: 02/17/2022  This Visit's Progress: On track  Priority: Medium  Note:   Current Barriers:  Ineffective Self Health Maintenance in a patient with  Prediabetes, High Triglycerides, Tobacco Use Clinical Goal(s):  Collaboration with [name], FNP regarding development and update of comprehensive plan of care as evidenced by provider attestation and co-signature Inter-disciplinary care team collaboration (see longitudinal plan of care) patient will work with care management team to address care coordination and chronic disease management needs related to Disease Management Educational Needs Care Coordination Medication Management and Education Medication Reconciliation Psychosocial Support   Interventions:  02/17/21 completed successful outbound call with patient  Evaluation of current treatment plan related to  Elevated Triglycerides , self-management and patient's adherence to plan as established by provider. Collaboration with [name], FNP regarding development and update of comprehensive plan of care as evidenced by provider attestation       and co-signature Inter-disciplinary care team collaboration (see longitudinal plan of care) Provided education to patient about basic disease process related to Elevated Triglycerides Review of patient status, including review of consultant's reports, relevant laboratory and other test results, and medications completed. Reviewed medications with patient and discussed importance of medication adherence Educated on dietary and exercise recommendations Mailed printed educational materials related to Triglycerides; Cooking to lower  Cholesterol  Discussed plans with patient for ongoing care management follow up and provided patient with direct contact information for care management team Self Care Activities:  Self administers medications as prescribed Attends all scheduled provider appointments Calls pharmacy for medication refills Calls provider office for new concerns or questions Patient Goals: - adhere to dietary and exercise recommendations   Follow Up Plan: Telephone follow up appointment with care management team  member scheduled for: 06/03/21     Patient Care Plan: Smoking Cessation     Problem Identified: Smoking Cessation   Priority: High     Long-Range Goal: Smoking Cessation   Start Date: 02/17/2021  Expected End Date: 02/17/2022  This Visit's Progress: On track  Priority: High  Note:   Current Barriers:  Ineffective Self Health Maintenance in a patient with Prediabetes, High Triglycerides, Tobacco Use Clinical Goal(s):  Collaboration with [name], FNP regarding development and update of comprehensive plan of care as evidenced by provider attestation and co-signature Inter-disciplinary care team collaboration (see longitudinal plan of care) patient will work with care management team to address care coordination and chronic disease management needs related to Disease Management Educational Needs Care Coordination Medication Management and Education Medication Reconciliation Psychosocial Support   Interventions:  02/17/21 completed successful outbound call with patient  Evaluation of current treatment plan related to  tobacco abuse , self-management and patient's adherence to plan as established by provider. Collaboration with [name], FNP regarding development and update of comprehensive plan of care as evidenced by provider attestation       and co-signature Inter-disciplinary care team collaboration (see longitudinal plan of care) Discussed patient smokes about 1 pack of  cigarettes over 1-2 weeks due to cost Determined patient has tried Nicotine patches in the past with minimal effectiveness, he would like to try something else  Determined patient spoke with the embedded Pharm D, she did not make any recommendations Sent in basket message to PCP regarding additional recommendations to help patient with smoking cessation  Discussed plans with patient for ongoing care management follow up and provided patient with direct contact information for care management team Self Care Activities:  Self administers medications as prescribed Attends all scheduled provider appointments Calls pharmacy for medication refills Calls provider office for new concerns or questions Patient Goals: - work with embedded Pharm D and PCP to determined best pharmacological treatment to help with tobacco cessation   Follow Up Plan: Telephone follow up appointment with care management team member scheduled for:  06/03/21

## 2021-02-24 ENCOUNTER — Telehealth: Payer: Self-pay

## 2021-02-24 NOTE — Telephone Encounter (Signed)
  Care Management   Follow Up Note   02/24/2021 Name: Scott Farrell MRN: [id number] DOB: 1980/12/10   Referred by: [name], FNP Reason for referral : Chronic Care Management (Unsuccessful call)   A second unsuccessful telephone outreach was attempted today. The patient was referred to the case management team for assistance with care management and care coordination. SW placed a HIPAA compliant voice message requesting a return call.  Follow Up Plan: The care management team will reach out to the patient again over the next 14 days.   [name], BSW, CDP Social Worker, Certified Dementia Practitioner [place] [facility] [place] [facility] [phone]

## 2021-02-28 DIAGNOSIS — E781 Pure hyperglyceridemia: Secondary | ICD-10-CM

## 2021-03-05 ENCOUNTER — Telehealth: Payer: Medicare (Managed Care)

## 2021-03-05 ENCOUNTER — Telehealth: Payer: Self-pay

## 2021-03-05 ENCOUNTER — Ambulatory Visit: Payer: Medicare (Managed Care) | Admitting: Nurse Practitioner

## 2021-03-05 ENCOUNTER — Ambulatory Visit: Payer: Self-pay

## 2021-03-05 DIAGNOSIS — R7303 Prediabetes: Secondary | ICD-10-CM

## 2021-03-05 DIAGNOSIS — E781 Pure hyperglyceridemia: Secondary | ICD-10-CM

## 2021-03-05 NOTE — Chronic Care Management (AMB) (Signed)
Chronic Care Management    Social Work Note  03/05/2021 Name: LORRIS CARDUCCI MRN: [id number] DOB: 24-Oct-1980  [name] is a 40 y.o. year old male who is a primary care patient of [name], [place]. The CCM team was consulted to assist the patient with chronic disease management and/or care coordination needs related to:  prediabetes and high triglycerides .   Third unsuccessful telephone outreach was attempted today. The patient was referred to the case management team for assistance with care management and care coordination. The patient's primary care provider has been notified of our unsuccessful attempts to make or maintain contact with the patient. The care management team is pleased to engage with this patient at any time in the future should he/she be interested in assistance from the care management team.    Consent to Services:  The patient was given information about Chronic Care Management services, agreed to services, and gave verbal consent prior to initiation of services.  Please see initial visit note for detailed documentation.   Patient agreed to services and consent obtained.   Assessment: Review of patient past medical history, allergies, medications, and health status, including review of relevant consultants reports was performed today as part of a comprehensive evaluation and provision of chronic care management and care coordination services.     SDOH (Social Determinants of Health) assessments and interventions performed:    Advanced Directives Status: Not addressed in this encounter.  CCM Care Plan  Allergies  Allergen Reactions   Metformin And Related Other (See Comments)    Chest tightness    Outpatient Encounter Medications as of 03/05/2021  Medication Sig   benzonatate (TESSALON PERLES) 100 MG capsule Take 1 capsule (100 mg total) by mouth 3 (three) times daily as needed for cough.   divalproex (DEPAKOTE) 500 MG DR tablet Take 500 mg by  mouth at bedtime.   guaiFENesin-dextromethorphan (ROBITUSSIN DM) 100-10 MG/5ML syrup Take 5 mLs by mouth every 4 (four) hours as needed for cough.   risperiDONE (RISPERDAL) 3 MG tablet Take 3 mg by mouth 2 (two) times daily.   No facility-administered encounter medications on file as of 03/05/2021.    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   Lightheaded 07/31/2019   Callus of foot 01/27/2018   Unspecified constipation 04/27/2012   Family history of malignant neoplasm of gastrointestinal tract 04/27/2012   Gastric mass 04/21/2012    Conditions to be addressed/monitored:  prediabetes and high triglycerides  Care Plan : Social Work [place]  Updates made by [name] since 03/05/2021 12:00 AM  Completed 03/05/2021   Problem: Quality of Life (General Plan of Care) Resolved 03/05/2021     Long-Range Goal: Obtain better understanding of Community Resources Completed 03/05/2021  Start Date: 01/03/2021  Expected End Date: 04/03/2021  Priority: Medium  Note:   Current Barriers:  Chronic disease management support and education needs related to  Pre-diabetes and high triglycerides   Limited knowledge of community resources to assist with financial burden related to the cost of food, credit card debt, home repair and utility costs  Social Worker Clinical Goal(s):  patient will work with SW to identify and address any acute and/or chronic care coordination needs related to the self health management of pre-diabetes and high triglycerides  Patient will work with SW to become more knowledgeable of community resources  03/05/21- Goal closed due to inability to maintain patient contact  SW Interventions:  Inter-disciplinary care team collaboration (see longitudinal plan of  care) Collaboration with [name], [place] regarding development and update of comprehensive plan of care as evidenced by provider attestation and co-signature Successful outbound call placed to the patient to conduct an SDoH screen Determined the patient is in need of resources to assist with the cost of food, credit card debt, home repair, and utility costs Discussed the patient lives with his mother who he believes owns the home Patient reports the upstairs bathtub leaks and you can see an outline in the ceiling of the tup from the downstairs Educated the patient on programs including [employer] and [employer] information to the patients home advising his mother  would need to contact resources as the home is in her name Discussed the patient occasionally has difficulty affording the cost of food Provided verbal and written education on Out of the [employer] Patients reports difficulty affording utilities at times Educated the patient on the [employer] program offered through DSS to assist with heating costs Mailed the patient information on this program Patient requests resources to help get out of credit card debt Advised the patient SW would mail information on credit counseling offered by [employer] of the [employer] Scheduled follow up call over the next 45 days  Patient Goals/Self-Care Activities patient will:   -  Review mailed resources -Contact SW as needed  Follow Up Plan:  SW will follow up with the patient over the next 45 days       Follow Up Plan: No SW follow up planned at this time. SW is available to assist with resource needs should the patient return SW call. Patient will remain engaged with RN Care Manager and PharmD.      [name], BSW, CDP Social Worker, Certified Dementia Practitioner [place] [facility] [place] [facility] [phone]

## 2021-03-05 NOTE — Patient Instructions (Signed)
Social Worker Visit Information  Goals we discussed today:   Goals Addressed             This Visit's Progress    COMPLETED: Obtain better understanding of community resources       Timeframe:  Long-Range Goal Priority:  Medium Start Date:    8.5.22                         Expected End Date: 11.3.22            03/05/21- Goal closed due to inability to maintain patient contact           Patient Goals/Self-Care Activities patient will:   - Review mailed resources -Contact SW as needed         Materials Provided: No. Patient not reached.  Follow Up Plan:  No SW follow up planned at this time. Please contact me if resource assistance is desired.  [name], BSW, CDP Social Worker, Certified Dementia Practitioner [place] [facility] [place] [facility] [phone]

## 2021-03-05 NOTE — Telephone Encounter (Signed)
error 

## 2021-03-05 NOTE — Chronic Care Management (AMB) (Signed)
    Chronic Care Management Pharmacy Assistant   Name: Scott Farrell  MRN: [id number] DOB: Oct 18, 1980   Reason for Encounter: Disease State/ General   Recent office visits:  02-05-2021 Bary Castilla, NP. Negative covid results. START tessalon 100 mg 3 times daily as needed. START robitussin DM 5 mLs every 4 hours as needed.  02-17-2021 Little, Claudette Stapler, RN (CCM)  Recent consult visits:  None  Hospital visits:  None in previous 6 months  Medications: Outpatient Encounter Medications as of 03/05/2021  Medication Sig   benzonatate (TESSALON PERLES) 100 MG capsule Take 1 capsule (100 mg total) by mouth 3 (three) times daily as needed for cough.   divalproex (DEPAKOTE) 500 MG DR tablet Take 500 mg by mouth at bedtime.   guaiFENesin-dextromethorphan (ROBITUSSIN DM) 100-10 MG/5ML syrup Take 5 mLs by mouth every 4 (four) hours as needed for cough.   risperiDONE (RISPERDAL) 3 MG tablet Take 3 mg by mouth 2 (two) times daily.   No facility-administered encounter medications on file as of 03/05/2021.   Patient states he is now only smoking 2 black and mild cigars daily. Patient states he is still barely sleeping at night.  Care Gaps: Colonoscopy overdue Flu vaccine overdue Last medicare wellness 07-03-2020  Star Rating Drugs: None  Jeannette How [facility] Clinical Pharmacist Assistant [phone]

## 2021-03-12 ENCOUNTER — Other Ambulatory Visit: Payer: Self-pay

## 2021-03-12 ENCOUNTER — Ambulatory Visit (HOSPITAL_COMMUNITY)
Admission: EM | Admit: 2021-03-12 | Discharge: 2021-03-12 | Disposition: A | Discharge status: Home / Self Care | Payer: Medicare (Managed Care) | Attending: Family Medicine | Admitting: Family Medicine

## 2021-03-12 ENCOUNTER — Encounter (HOSPITAL_COMMUNITY): Payer: Self-pay

## 2021-03-12 DIAGNOSIS — Z20822 Contact with and (suspected) exposure to covid-19: Secondary | ICD-10-CM | POA: Diagnosis not present

## 2021-03-12 DIAGNOSIS — F17210 Nicotine dependence, cigarettes, uncomplicated: Secondary | ICD-10-CM | POA: Insufficient documentation

## 2021-03-12 DIAGNOSIS — R059 Cough, unspecified: Secondary | ICD-10-CM | POA: Diagnosis not present

## 2021-03-12 DIAGNOSIS — J069 Acute upper respiratory infection, unspecified: Secondary | ICD-10-CM | POA: Insufficient documentation

## 2021-03-12 DIAGNOSIS — Z560 Unemployment, unspecified: Secondary | ICD-10-CM | POA: Diagnosis not present

## 2021-03-12 MED ORDER — PROMETHAZINE-DM 6.25-15 MG/5ML PO SYRP: 5.0000 mL | ORAL_SOLUTION | Freq: Four times a day (QID) | ORAL | 0 refills | Status: DC | PRN

## 2021-03-12 MED ORDER — PREDNISONE 20 MG PO TABS: 40.0000 mg | ORAL_TABLET | Freq: Every day | ORAL | 0 refills | Status: DC

## 2021-03-12 NOTE — ED Triage Notes (Signed)
Pt reports sore throat, cough, chest tightness when coughing and nasal congestin since this morning.

## 2021-03-12 NOTE — Discharge Instructions (Addendum)
You have been tested for COVID-19 today. °If your test returns positive, you will receive a phone call from [facility] regarding your results. °Negative test results are not called. °Both positive and negative results area always visible on MyChart. °If you do not have a MyChart account, sign up instructions are provided in your discharge papers. °Please do not hesitate to contact us should you have questions or concerns. ° °

## 2021-03-13 LAB — SARS CORONAVIRUS 2 (TAT 6-24 HRS): SARS Coronavirus 2: NEGATIVE

## 2021-03-13 NOTE — ED Provider Notes (Signed)
[name]   [id number] 03/12/21 Arrival Time: 4970  ASSESSMENT & PLAN:  1. Viral URI with cough    Discussed typical duration of viral illnesses. COVID-19 testing sent. OTC symptom care as needed.  Begin: Meds ordered this encounter  Medications   predniSONE (DELTASONE) 20 MG tablet    Sig: Take 2 tablets (40 mg total) by mouth daily.    Dispense:  10 tablet    Refill:  0   promethazine-dextromethorphan (PROMETHAZINE-DM) 6.25-15 MG/5ML syrup    Sig: Take 5 mLs by mouth 4 (four) times daily as needed for cough.    Dispense:  118 mL    Refill:  0     Follow-up Information     Scott Brine, FNP.   Specialty: General Practice Why: As needed. Contact information: [address] [place] [address] [phone]                 Reviewed expectations re: course of current medical issues. Questions answered. Outlined signs and symptoms indicating need for more acute intervention. Understanding verbalized. After Visit Summary given.   SUBJECTIVE: History from: patient. Scott Farrell is a 40 y.o. male who reports: ST, cough, chest tightness, nasal congestion; abrupt onset over past 24 h. Denies: fever and difficulty breathing. Normal PO intake without n/v/d.   OBJECTIVE:  Vitals:   03/12/21 1913  BP: 117/73  Pulse: 89  Resp: 18  Temp: 98.8 F (37.1 C)  TempSrc: Oral  SpO2: 95%    General appearance: alert; no distress Eyes: PERRLA; EOMI; conjunctiva normal HENT: [place]; AT; with nasal congestion Neck: supple  Lungs: speaks full sentences without difficulty; unlabored; mild wheezing bilaterally Extremities: no edema Skin: warm and dry Neurologic: normal gait Psychological: alert and cooperative; normal mood and affect  Labs: Results for orders placed or performed during the hospital encounter of 03/12/21  SARS CORONAVIRUS 2 (TAT 6-24 HRS) Nasopharyngeal Nasopharyngeal Swab   Specimen: Nasopharyngeal Swab  Result  Value Ref Range   SARS Coronavirus 2 NEGATIVE NEGATIVE   Labs Reviewed  SARS CORONAVIRUS 2 (TAT 6-24 HRS)     Allergies  Allergen Reactions   Metformin And Related Other (See Comments)    Chest tightness    Past Medical History:  Diagnosis Date   Clotting disorder ([place])    personal hx/o? etiology unclear   Costochondritis    history of   Herpes genitalia 1/12   History of echocardiogram 01/07/11   normal LV function, EF 60%, trace mitral, tricuspid and pulmonic regurgitation; Dr. Tollie Eth   IBS (irritable bowel syndrome)    Mental disorder 2013   hospitalized for psychiatric illness prior   Normal cardiac stress test 01/07/11   negative treadmill test, no evidence of ischemia, good exercise capacity; Tollie Eth, MD;    Prostatitis 11/2010   Dr. Karsten Ro   Renal stone 06/2012   Urology consult   Schizophrenia [facility])    [facility], [facility] - Dr. Lennon Farrell   Tobacco use disorder    Social History   Socioeconomic History   Marital status: Single    Spouse name: Not on file   Number of children: Not on file   Years of education: Not on file   Highest education level: Not on file  Occupational History   Occupation: [employer]  Tobacco Use   Smoking status: Some Days    Packs/day: 0.25    Years: 23.00    Pack years: 5.75    Types: Cigars, Cigarettes  Smokeless tobacco: Never   Tobacco comments:    1-2 black milds daily; started back smoking cigarettes.   Vaping Use   Vaping Use: Former   Substances: Nicotine  Substance and Sexual Activity   Alcohol use: Yes    Comment: occ   Drug use: Not Currently   Sexual activity: Not Currently    Partners: Female  Other Topics Concern   Not on file  Social History Narrative   Lives with mother, Scott Farrell, exercising with walking, running, no current relationship; unemployed   Caffeine use: 1 cup per day   Right handed   Social Determinants of Health   Financial Resource Strain: Low Risk    Difficulty of  Paying Living Expenses: Not hard at all  Food Insecurity: Food Insecurity Present   Worried About Charity fundraiser in the Last Year: Sometimes true   Arboriculturist in the Last Year: Sometimes true  Transportation Needs: No Transportation Needs   Lack of Transportation (Medical): No   Lack of Transportation (Non-Medical): No  Physical Activity: Inactive   Days of Exercise per Week: 0 days   Minutes of Exercise per Session: 0 min  Stress: Stress Concern Present   Feeling of Stress : To some extent  Social Connections: Not on file  Intimate Partner Violence: Not on file   Family History  Problem Relation Age of Onset   Hypertension Mother    Stroke Mother    Depression Mother    Acne Mother    Thyroid disease Mother    Diabetes Mother    Colon cancer Mother 62   Acute lymphoblastic leukemia Father    Diabetes Father    Cancer Brother    Breast cancer Maternal Aunt    Ovarian cancer Maternal Aunt    Past Surgical History:  Procedure Laterality Date   EUS  06/30/2012   Procedure: UPPER ENDOSCOPIC ULTRASOUND (EUS) LINEAR;  Surgeon: Scott Banister, MD;  Location: [facility] ENDOSCOPY;  Service: Endoscopy;  Laterality: N/A;  radial linear   WISDOM TOOTH EXTRACTION       Scott Kick, MD 03/13/21 [phone]

## 2021-05-12 ENCOUNTER — Encounter (HOSPITAL_COMMUNITY): Payer: Self-pay

## 2021-05-12 ENCOUNTER — Other Ambulatory Visit: Payer: Self-pay

## 2021-05-12 ENCOUNTER — Ambulatory Visit (HOSPITAL_COMMUNITY)
Admission: EM | Admit: 2021-05-12 | Discharge: 2021-05-12 | Disposition: A | Discharge status: Home / Self Care | Payer: Medicare (Managed Care) | Attending: Emergency Medicine | Admitting: Emergency Medicine

## 2021-05-12 DIAGNOSIS — U071 COVID-19: Secondary | ICD-10-CM | POA: Diagnosis not present

## 2021-05-12 DIAGNOSIS — F17210 Nicotine dependence, cigarettes, uncomplicated: Secondary | ICD-10-CM | POA: Diagnosis not present

## 2021-05-12 DIAGNOSIS — R0789 Other chest pain: Secondary | ICD-10-CM | POA: Diagnosis not present

## 2021-05-12 DIAGNOSIS — J069 Acute upper respiratory infection, unspecified: Secondary | ICD-10-CM | POA: Diagnosis not present

## 2021-05-12 DIAGNOSIS — J029 Acute pharyngitis, unspecified: Secondary | ICD-10-CM | POA: Insufficient documentation

## 2021-05-12 DIAGNOSIS — R059 Cough, unspecified: Secondary | ICD-10-CM | POA: Diagnosis present

## 2021-05-12 LAB — POC INFLUENZA A AND B ANTIGEN (URGENT CARE ONLY)
INFLUENZA A ANTIGEN, POC: NEGATIVE
INFLUENZA B ANTIGEN, POC: NEGATIVE

## 2021-05-12 MED ORDER — BENZONATATE 100 MG PO CAPS: 100.0000 mg | ORAL_CAPSULE | Freq: Three times a day (TID) | ORAL | 0 refills | Status: DC

## 2021-05-12 NOTE — Discharge Instructions (Addendum)
Flu test negative. COVID test sent out - we will contact you with results. Take medication as prescribed to help with cough and can continue with OTC medicine as well. Follow-up with PCP if no improvement in a week. Go to the ER if develop difficulty breathing.

## 2021-05-12 NOTE — ED Provider Notes (Signed)
[place]    CSN: [id number] Arrival date & time: 05/12/21  1736      History   Chief Complaint Chief Complaint  Patient presents with   Cough   Chills    HPI Scott Farrell is a 40 y.o. male.   Patient presents with concerns of feeling unwell since yesterday. He reports headache, body aches, chills, nasal congestion, sore throat, cough, and sneezing. He reports sometimes when he coughs he has some chest discomfort. He has not taken his temperature but felt feverish. The patient took Theraflu without improvement. He states his mother has been sick with similar but he's not sure if she's been tested for anything.   The history is provided by the patient.  Cough Associated symptoms: chills, headaches, myalgias and sore throat   Associated symptoms: no ear pain, no rash, no rhinorrhea, no shortness of breath and no wheezing    Past Medical History:  Diagnosis Date   Clotting disorder ([place])    personal hx/o? etiology unclear   Costochondritis    history of   Herpes genitalia 1/12   History of echocardiogram 01/07/11   normal LV function, EF 60%, trace mitral, tricuspid and pulmonic regurgitation; Dr. Tollie Eth   IBS (irritable bowel syndrome)    Mental disorder 2013   hospitalized for psychiatric illness prior   Normal cardiac stress test 01/07/11   negative treadmill test, no evidence of ischemia, good exercise capacity; Tollie Eth, MD;    Prostatitis 11/2010   Dr. Karsten Ro   Renal stone 06/2012   Urology consult   Schizophrenia [facility])    [facility], [facility] - Dr. 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 Authorizing Provider  benzonatate (TESSALON) 100 MG capsule Take 1 capsule (100 mg total) by mouth every 8 (eight) hours. 05/12/21  Yes Jake Fuhrmann L, PA  divalproex (DEPAKOTE) 500 MG DR tablet Take 500 mg by mouth at bedtime. 08/06/20   [provider]  predniSONE (DELTASONE) 20 MG tablet Take 2 tablets (40 mg total) by mouth daily. 03/12/21   Vanessa Kick, MD  promethazine-dextromethorphan (PROMETHAZINE-DM) 6.25-15 MG/5ML syrup Take 5 mLs by mouth 4 (four) times daily as needed for cough. 03/12/21   Vanessa Kick, MD  risperiDONE (RISPERDAL) 3 MG tablet Take 3 mg by mouth 2 (two) times daily. 08/02/20   [provider]    Family History Family History  Problem Relation Age of Onset   Hypertension Mother    Stroke Mother    Depression Mother    Acne Mother    Thyroid disease Mother    Diabetes Mother  Colon cancer Mother 5   Acute lymphoblastic leukemia Father    Diabetes Father    Cancer Brother    Breast cancer Maternal Aunt    Ovarian cancer Maternal Aunt     Social History Social History   Tobacco Use   Smoking status: Some Days    Packs/day: 0.25    Years: 23.00    Pack years: 5.75    Types: Cigars, Cigarettes   Smokeless tobacco: Never   Tobacco comments:    1-2 black milds daily; started back smoking cigarettes.   Vaping Use   Vaping Use: Former   Substances: Nicotine  Substance Use Topics   Alcohol use: Yes    Comment: occ   Drug use: Not Currently      Allergies   Metformin and related   Review of Systems Review of Systems  Constitutional:  Positive for chills and fatigue.  HENT:  Positive for congestion and sore throat. Negative for ear pain and rhinorrhea.   Respiratory:  Positive for cough and chest tightness. Negative for shortness of breath and wheezing.   Cardiovascular:  Negative for palpitations.  Gastrointestinal:  Negative for abdominal pain, diarrhea, nausea and vomiting.  Musculoskeletal:  Positive for myalgias.  Skin:  Negative for rash.  Neurological:  Positive for headaches. Negative for dizziness.    Physical Exam Triage Vital Signs ED Triage Vitals  Enc Vitals Group     BP 05/12/21 1839 122/72     Pulse Rate 05/12/21 1838 68     Resp 05/12/21 1838 19     Temp 05/12/21 1838 98.3 F (36.8 C)     Temp Source 05/12/21 1838 Oral     SpO2 05/12/21 1838 99 %     Weight --      Height --      Head Circumference --      Peak Flow --      Pain Score 05/12/21 1837 7     Pain Loc --      Pain Edu? --      Excl. in [place]? --    No data found.  Updated Vital Signs BP 122/72   Pulse 68   Temp 98.3 F (36.8 C) (Oral)   Resp 19   SpO2 99%   Visual Acuity Right Eye Distance:   Left Eye Distance:   Bilateral Distance:    Right Eye Near:   Left Eye Near:    Bilateral Near:     Physical Exam Vitals and nursing note reviewed.  Constitutional:      General: He is not in acute distress. HENT:     Head: Normocephalic.     Nose: Congestion present.     Mouth/Throat:     Mouth: Mucous membranes are moist.     Pharynx: Oropharynx is clear. No oropharyngeal exudate.  Eyes:     Conjunctiva/sclera: Conjunctivae normal.     Pupils: Pupils are equal, round, and reactive to light.  Cardiovascular:     Rate and Rhythm: Normal rate and regular rhythm.     Heart sounds: Normal heart sounds.  Pulmonary:     Effort: Pulmonary effort is normal.     Breath sounds: Normal breath sounds.  Musculoskeletal:      Cervical back: Normal range of motion.  Lymphadenopathy:     Cervical: No cervical adenopathy.  Skin:    Findings: No rash.  Neurological:     Mental Status: He is alert.     Gait: Gait normal.  Psychiatric:        Mood and Affect: Mood normal.     UC Treatments / Results  Labs (all labs ordered are listed, but only abnormal results are displayed) Labs Reviewed  SARS CORONAVIRUS 2 (TAT 6-24 HRS)  POC INFLUENZA A AND B ANTIGEN (URGENT CARE ONLY)    EKG   Radiology No results found.  Procedures Procedures (including critical care time)  Medications Ordered in UC Medications - No data to display  Initial Impression / Assessment and Plan / UC Course  I have reviewed the triage vital signs and the nursing notes.  Pertinent labs & imaging results that were available during my care of the patient were reviewed by me and considered in my medical decision making (see chart for details).     Viral sx. Flu neg, COVID pending. Sx tx and reassurance. No red flags. Discussed follow-up and ER precautions.   E/M: 1 acute uncomplicated illness, 2 data (flu, covid), moderate risk due to prescription management  Final Clinical Impressions(s) / UC Diagnoses   Final diagnoses:  Viral upper respiratory tract infection     Discharge Instructions      Flu test negative. COVID test sent out - we will contact you with results. Take medication as prescribed to help with cough and can continue with OTC medicine as well. Follow-up with PCP if no improvement in a week. Go to the ER if develop difficulty breathing.      ED Prescriptions     Medication Sig Dispense Auth. Provider   benzonatate (TESSALON) 100 MG capsule Take 1 capsule (100 mg total) by mouth every 8 (eight) hours. 21 capsule Abner Greenspan, Shenae Bonanno L, PA      PDMP not reviewed this encounter.   Delsa Sale, [place] 05/12/21 1918

## 2021-05-12 NOTE — ED Triage Notes (Signed)
Pt presents with cough and chills that started yesterday morning. States he has been sneezing a lot and states he has had some chest pain.

## 2021-05-13 LAB — SARS CORONAVIRUS 2 (TAT 6-24 HRS): SARS Coronavirus 2: POSITIVE — AB

## 2021-05-15 ENCOUNTER — Other Ambulatory Visit: Payer: Self-pay

## 2021-05-15 ENCOUNTER — Ambulatory Visit (HOSPITAL_COMMUNITY)
Admission: EM | Admit: 2021-05-15 | Discharge: 2021-05-15 | Disposition: A | Discharge status: Home / Self Care | Payer: Medicare (Managed Care) | Attending: Emergency Medicine | Admitting: Emergency Medicine

## 2021-05-15 ENCOUNTER — Encounter (HOSPITAL_COMMUNITY): Payer: Self-pay | Admitting: Emergency Medicine

## 2021-05-15 DIAGNOSIS — R1030 Lower abdominal pain, unspecified: Secondary | ICD-10-CM | POA: Diagnosis not present

## 2021-05-15 MED ORDER — ALUM & MAG HYDROXIDE-SIMETH 400-400-40 MG/5ML PO SUSP: 15.0000 mL | Freq: Four times a day (QID) | ORAL | 0 refills | Status: DC | PRN

## 2021-05-15 MED ORDER — SENNOSIDES-DOCUSATE SODIUM 8.6-50 MG PO TABS: 1.0000 | ORAL_TABLET | Freq: Every day | ORAL | 0 refills | Status: DC

## 2021-05-15 MED ORDER — LACTULOSE 10 GM/15ML PO SOLN: 10.0000 g | Freq: Every day | ORAL | 0 refills | Status: AC | PRN

## 2021-05-15 NOTE — Discharge Instructions (Signed)
You may attempt dietary changes by increasing your fiber intake with green leafy vegetables, increased intake of fruit or fruit juices to see if that will initiate a normal bowel movement  You may use lactulose 15 mL once a day until bowel movement occurs, you have been given enough medicine for 3 days, once your bowel movement occurs please stop using this medicine, while using this medicine please increase your fluid intake through use of water to prevent dehydration  After your bowel movement occurs you may begin using Senokot daily, this medication is a mixture of a stool softener and a laxative will help to keep you regular  You may use Maalox up to 4 times a day to help reduce stomach acid and gas production  At any point if your symptoms worsen you need to go to the nearest emergency department for further evaluation  Information is on our patient for work for the gastroenterologist, stomach specialist, so that they may evaluate you for need of a colonoscopy

## 2021-05-15 NOTE — ED Provider Notes (Signed)
[place]    CSN: [id number] Arrival date & time: 05/15/21  1725      History   Chief Complaint Chief Complaint  Patient presents with   Abdominal Pain    HPI Scott Farrell is a 40 y.o. male.   Patient presents with centralized abdominal pain for 2 days described as sharp. LBM yesterday, described as being harder than normal and requiring him to strain. Pain worsened with all movement.  Denies nausea, vomiting, fever, chills. Currently COVID positive. Has not Attempted treatment.   Past Medical History:  Diagnosis Date   Clotting disorder [facility])    personal hx/o? etiology unclear   Costochondritis    history of   Herpes genitalia 1/12   History of echocardiogram 01/07/11   normal LV function, EF 60%, trace mitral, tricuspid and pulmonic regurgitation; Dr. Tollie Eth   IBS (irritable bowel syndrome)    Mental disorder 2013   hospitalized for psychiatric illness prior   Normal cardiac stress test 01/07/11   negative treadmill test, no evidence of ischemia, good exercise capacity; Tollie Eth, MD;    Prostatitis 11/2010   Dr. Karsten Ro   Renal stone 06/2012   Urology consult   Schizophrenia [facility])    [facility] Psychiatry, [facility] - Dr. 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 Authorizing Provider  benzonatate (TESSALON) 100 MG capsule Take 1 capsule (100 mg total) by mouth every 8 (eight) hours. 05/12/21  Yes Arruda, Amy L, PA  divalproex (DEPAKOTE) 500 MG DR tablet Take 500 mg by mouth at bedtime. 08/06/20  Yes [provider]  risperiDONE (RISPERDAL) 3 MG tablet Take 3 mg by mouth 2 (two) times daily. 08/02/20  Yes [provider]  predniSONE (DELTASONE) 20 MG tablet Take 2 tablets (40 mg total) by mouth daily. 03/12/21   Vanessa Kick, MD  promethazine-dextromethorphan (PROMETHAZINE-DM) 6.25-15 MG/5ML syrup Take 5 mLs by mouth 4 (four) times daily as needed for cough. 03/12/21   Vanessa Kick, MD    Family History Family History  Problem Relation Age of Onset   Hypertension Mother    Stroke Mother    Depression Mother    Acne Mother    Thyroid disease Mother    Diabetes Mother    Colon cancer Mother 1   Acute lymphoblastic leukemia Father    Diabetes Father    Cancer Brother    Breast cancer Maternal Aunt    Ovarian cancer Maternal Aunt     Social History Social History   Tobacco Use   Smoking status: Some Days    Packs/day: 0.25    Years: 23.00  Pack years: 5.75    Types: Cigars, Cigarettes   Smokeless tobacco: Never   Tobacco comments:    1-2 black milds daily; started back smoking cigarettes.   Vaping Use   Vaping Use: Former   Substances: Nicotine  Substance Use Topics   Alcohol use: Yes    Comment: occ   Drug use: Not Currently     Allergies   Metformin and related   Review of Systems Review of Systems  Constitutional: Negative.   Respiratory: Negative.    Cardiovascular: Negative.   Gastrointestinal:  Positive for abdominal pain and constipation. Negative for abdominal distention, anal bleeding, blood in stool, diarrhea, nausea, rectal pain and vomiting.  Skin: Negative.    Neurological: Negative.     Physical Exam Triage Vital Signs ED Triage Vitals  Enc Vitals Group     BP 05/15/21 1752 118/72     Pulse Rate 05/15/21 1752 75     Resp 05/15/21 1752 16     Temp 05/15/21 1752 98.4 F (36.9 C)     Temp Source 05/15/21 1752 Oral     SpO2 05/15/21 1752 95 %     Weight --      Height --      Head Circumference --      Peak Flow --      Pain Score 05/15/21 1755 5     Pain Loc --      Pain Edu? --      Excl. in [place]? --    No data found.  Updated Vital Signs BP 118/72 (BP Location: Right Arm)    Pulse 75    Temp 98.4 F (36.9 C) (Oral)    Resp 16    SpO2 95%   Visual Acuity Right Eye Distance:   Left Eye Distance:   Bilateral Distance:    Right Eye Near:   Left Eye Near:    Bilateral Near:     Physical Exam Constitutional:      Appearance: He is well-developed and normal weight.  HENT:     Head: Normocephalic.  Eyes:     Extraocular Movements: Extraocular movements intact.  Pulmonary:     Effort: Pulmonary effort is normal.  Abdominal:     General: Abdomen is flat. Bowel sounds are normal.     Palpations: Abdomen is soft.     Tenderness: There is abdominal tenderness in the suprapubic area. There is guarding. There is no right CVA tenderness.  Skin:    General: Skin is warm and dry.  Neurological:     General: No focal deficit present.     Mental Status: He is alert and oriented to person, place, and time.  Psychiatric:        Mood and Affect: Mood normal.        Behavior: Behavior normal.     UC Treatments / Results  Labs (all labs ordered are listed, but only abnormal results are displayed) Labs Reviewed - No data to display  EKG   Radiology No results found.  Procedures Procedures (including critical care time)  Medications Ordered in UC Medications - No data to display  Initial Impression / Assessment and Plan / UC Course  I have reviewed the triage vital signs and the nursing notes.  Pertinent labs &  imaging results that were available during my care of the patient were reviewed by me and considered in my medical decision making (see chart for details).  Lower abdominal pain  Vital signs are  stable, patient is in no signs of distress, lower abdominal tenderness is present on exam, discussed with patient that we  may do a watchful waiting and attempt to clear his bowels with strict return precautions for worsening symptoms to go to nearest emergency department, patient in agreement with plan of care, prescribed 3 days worth of lactulose to be taken daily, advised discontinuing the medication recommended after bowel movement occurs, prescribed Senokot for daily use and Maalox to help reduce any stomach acid and reduce gas production, patient wanting to attempt dietary changes before use of medication, in agreement with plan  Final Clinical Impressions(s) / UC Diagnoses   Final diagnoses:  None   Discharge Instructions   None    ED Prescriptions   None    PDMP not reviewed this encounter.   Hans Eden, NP 05/15/21 [phone]

## 2021-05-15 NOTE — ED Triage Notes (Signed)
Patient c/o mid lower ABD pain x 2 days ago.   Patient denies N/V/D. Patient denies fever.   Patient endorses constipation.   Patient endorses being diagnosed with COVID-19 on "Tuesday of this week".   Patient hasn't taken any medications for symptoms.

## 2021-05-16 ENCOUNTER — Encounter (HOSPITAL_COMMUNITY): Payer: Self-pay

## 2021-05-16 ENCOUNTER — Emergency Department (HOSPITAL_COMMUNITY): Payer: Medicare (Managed Care)

## 2021-05-16 ENCOUNTER — Emergency Department (HOSPITAL_COMMUNITY)
Admission: EM | Admit: 2021-05-16 | Discharge: 2021-05-16 | Disposition: A | Discharge status: Home / Self Care | Payer: Medicare (Managed Care) | Attending: Emergency Medicine | Admitting: Emergency Medicine

## 2021-05-16 DIAGNOSIS — K5732 Diverticulitis of large intestine without perforation or abscess without bleeding: Secondary | ICD-10-CM | POA: Diagnosis not present

## 2021-05-16 DIAGNOSIS — R1030 Lower abdominal pain, unspecified: Secondary | ICD-10-CM | POA: Diagnosis present

## 2021-05-16 DIAGNOSIS — K5792 Diverticulitis of intestine, part unspecified, without perforation or abscess without bleeding: Secondary | ICD-10-CM

## 2021-05-16 DIAGNOSIS — F17210 Nicotine dependence, cigarettes, uncomplicated: Secondary | ICD-10-CM | POA: Insufficient documentation

## 2021-05-16 LAB — CBC WITH DIFFERENTIAL/PLATELET
Abs Immature Granulocytes: 0.03 10*3/uL (ref 0.00–0.07)
Basophils Absolute: 0 10*3/uL (ref 0.0–0.1)
Basophils Relative: 0 %
Eosinophils Absolute: 0 10*3/uL (ref 0.0–0.5)
Eosinophils Relative: 0 %
HCT: 39.1 % (ref 39.0–52.0)
Hemoglobin: 13.5 g/dL (ref 13.0–17.0)
Immature Granulocytes: 0 %
Lymphocytes Relative: 20 %
Lymphs Abs: 2.1 10*3/uL (ref 0.7–4.0)
MCH: 29.7 pg (ref 26.0–34.0)
MCHC: 34.5 g/dL (ref 30.0–36.0)
MCV: 86.1 fL (ref 80.0–100.0)
Monocytes Absolute: 1.3 10*3/uL — ABNORMAL HIGH (ref 0.1–1.0)
Monocytes Relative: 12 %
Neutro Abs: 7.2 10*3/uL (ref 1.7–7.7)
Neutrophils Relative %: 68 %
Platelets: 148 10*3/uL — ABNORMAL LOW (ref 150–400)
RBC: 4.54 MIL/uL (ref 4.22–5.81)
RDW: 14 % (ref 11.5–15.5)
WBC: 10.6 10*3/uL — ABNORMAL HIGH (ref 4.0–10.5)
nRBC: 0 % (ref 0.0–0.2)

## 2021-05-16 LAB — COMPREHENSIVE METABOLIC PANEL
ALT: 17 U/L (ref 0–44)
AST: 19 U/L (ref 15–41)
Albumin: 4.1 g/dL (ref 3.5–5.0)
Alkaline Phosphatase: 48 U/L (ref 38–126)
Anion gap: 11 (ref 5–15)
BUN: 14 mg/dL (ref 6–20)
CO2: 21 mmol/L — ABNORMAL LOW (ref 22–32)
Calcium: 9.1 mg/dL (ref 8.9–10.3)
Chloride: 102 mmol/L (ref 98–111)
Creatinine, Ser: 1.19 mg/dL (ref 0.61–1.24)
GFR, Estimated: >60 mL/min (ref >60)
Glucose, Bld: 100 mg/dL — ABNORMAL HIGH (ref 70–99)
Potassium: 3.7 mmol/L (ref 3.5–5.1)
Sodium: 134 mmol/L — ABNORMAL LOW (ref 135–145)
Total Bilirubin: 1.1 mg/dL (ref 0.3–1.2)
Total Protein: 7.5 g/dL (ref 6.5–8.1)

## 2021-05-16 LAB — URINALYSIS, ROUTINE W REFLEX MICROSCOPIC
Glucose, UA: NEGATIVE mg/dL
Ketones, ur: 15 mg/dL — AB
Leukocytes,Ua: NEGATIVE
Nitrite: NEGATIVE
Protein, ur: 100 mg/dL — AB
Specific Gravity, Urine: >1.03 — ABNORMAL HIGH (ref 1.005–1.030)
pH: 5.5 (ref 5.0–8.0)

## 2021-05-16 LAB — URINALYSIS, MICROSCOPIC (REFLEX)

## 2021-05-16 LAB — LIPASE, BLOOD: Lipase: 22 U/L (ref 11–51)

## 2021-05-16 MED ORDER — CIPROFLOXACIN HCL 500 MG PO TABS: 500.0000 mg | ORAL_TABLET | Freq: Two times a day (BID) | ORAL | 0 refills | Status: DC

## 2021-05-16 MED ORDER — IOHEXOL 300 MG/ML  SOLN
90.0000 mL | Freq: Once | INTRAMUSCULAR | Status: AC | PRN
  Administered 2021-05-16: 90 mL via INTRAVENOUS

## 2021-05-16 MED ORDER — ACETAMINOPHEN 500 MG PO TABS
1000.0000 mg | ORAL_TABLET | Freq: Once | ORAL | Status: AC
  Administered 2021-05-16: 1000 mg via ORAL
  Filled 2021-05-16: qty 2

## 2021-05-16 MED ORDER — SODIUM CHLORIDE 0.9 % IV BOLUS
500.0000 mL | Freq: Once | INTRAVENOUS | Status: AC
  Administered 2021-05-16: 500 mL via INTRAVENOUS

## 2021-05-16 MED ORDER — HYDROCODONE-ACETAMINOPHEN 5-325 MG PO TABS: 1.0000 | ORAL_TABLET | Freq: Four times a day (QID) | ORAL | 0 refills | Status: DC | PRN

## 2021-05-16 MED ORDER — ONDANSETRON 4 MG PO TBDP: ORAL_TABLET | ORAL | 0 refills | Status: DC

## 2021-05-16 MED ORDER — CIPROFLOXACIN IN D5W 400 MG/200ML IV SOLN
400.0000 mg | Freq: Once | INTRAVENOUS | Status: AC
  Administered 2021-05-16: 400 mg via INTRAVENOUS
  Filled 2021-05-16: qty 200

## 2021-05-16 MED ORDER — METRONIDAZOLE 500 MG PO TABS: 500.0000 mg | ORAL_TABLET | Freq: Four times a day (QID) | ORAL | 0 refills | Status: DC

## 2021-05-16 MED ORDER — METRONIDAZOLE 500 MG/100ML IV SOLN
500.0000 mg | Freq: Once | INTRAVENOUS | Status: AC
  Administered 2021-05-16: 500 mg via INTRAVENOUS
  Filled 2021-05-16: qty 100

## 2021-05-16 NOTE — ED Triage Notes (Signed)
Pt arrives POV for eval of lower abd pain x 2 days ago, endorses constipation. Denies N/V.

## 2021-05-16 NOTE — ED Notes (Signed)
Gold top sent down with labs.

## 2021-05-16 NOTE — Discharge Instructions (Signed)
Follow-up with your family doctor next week for recheck return sooner if any problems

## 2021-05-16 NOTE — ED Provider Notes (Signed)
[place] EMERGENCY DEPARTMENT Provider Note   CSN: [id number] Arrival date & time: 05/16/21  1729     History Chief Complaint  Patient presents with   Abdominal Pain    Scott Farrell is a 40 y.o. male.  Patient complains of abdominal discomfort.  He points to the suprapubic area.  No fever no chills no vomiting  The history is provided by the patient and medical records. No language interpreter was used.  Abdominal Pain Pain location:  Suprapubic Pain quality: aching   Pain radiates to:  Does not radiate Pain severity:  Moderate Onset quality:  Sudden Timing:  Constant Progression:  Waxing and waning Chronicity:  New Context: not alcohol use   Relieved by:  Nothing Worsened by:  Nothing Ineffective treatments:  None tried Associated symptoms: no anorexia, no chest pain, no cough, no diarrhea, no fatigue and no hematuria       Past Medical History:  Diagnosis Date   Clotting disorder ([place])    personal hx/o? etiology unclear   Costochondritis    history of   Herpes genitalia 1/12   History of echocardiogram 01/07/11   normal LV function, EF 60%, trace mitral, tricuspid and pulmonic regurgitation; Dr. Tollie Eth   IBS (irritable bowel syndrome)    Mental disorder 2013   hospitalized for psychiatric illness prior   Normal cardiac stress test 01/07/11   negative treadmill test, no evidence of ischemia, good exercise capacity; Tollie Eth, MD;    Prostatitis 11/2010   Dr. Karsten Ro   Renal stone 06/2012   Urology consult   Schizophrenia [facility])    [facility] Psychiatry, [facility] - Dr. 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   Lightheaded 07/31/2019   Callus of foot 01/27/2018   Unspecified  constipation 04/27/2012   Family history of malignant neoplasm of gastrointestinal tract 04/27/2012   Gastric mass 04/21/2012    Past Surgical History:  Procedure Laterality Date   EUS  06/30/2012   Procedure: UPPER ENDOSCOPIC ULTRASOUND (EUS) LINEAR;  Surgeon: Milus Banister, MD;  Location: [facility];  Service: Endoscopy;  Laterality: N/A;  radial linear   WISDOM TOOTH EXTRACTION         Family History  Problem Relation Age of Onset   Hypertension Mother    Stroke Mother    Depression Mother    Acne Mother    Thyroid disease Mother    Diabetes Mother    Colon cancer Mother 32   Acute lymphoblastic leukemia Father    Diabetes Father    Cancer Brother    Breast cancer Maternal Aunt    Ovarian cancer Maternal Aunt     Social History   Tobacco Use   Smoking status: Some Days    Packs/day: 0.25    Years: 23.00    Pack years: 5.75    Types: Cigars, Cigarettes   Smokeless tobacco: Never   Tobacco comments:    1-2 black milds daily; started back smoking cigarettes.   Vaping Use   Vaping Use: Former   Substances: Nicotine  Substance Use Topics   Alcohol use: Yes    Comment: occ   Drug use: Not Currently    Home Medications Prior to Admission medications  Medication Sig Start Date End Date Taking? Authorizing Provider  ciprofloxacin (CIPRO) 500 MG tablet Take 1 tablet (500 mg total) by mouth 2 (two) times daily. One po bid x 7 days 05/16/21  Yes Milton Ferguson, MD  HYDROcodone-acetaminophen (NORCO/VICODIN) 5-325 MG tablet Take 1 tablet by mouth every 6 (six) hours as needed for moderate pain. 05/16/21  Yes Milton Ferguson, MD  metroNIDAZOLE (FLAGYL) 500 MG tablet Take 1 tablet (500 mg total) by mouth 4 (four) times daily. One po bid x 7 days 05/16/21  Yes Milton Ferguson, MD  ondansetron (ZOFRAN-ODT) 4 MG disintegrating tablet 4mg  ODT q4 hours prn nausea/vomit 05/16/21  Yes Milton Ferguson, MD  alum & mag hydroxide-simeth (MAALOX PLUS) 400-400-40 MG/5ML suspension  Take 15 mLs by mouth every 6 (six) hours as needed for indigestion. 05/15/21   White, Leitha Schuller, NP  benzonatate (TESSALON) 100 MG capsule Take 1 capsule (100 mg total) by mouth every 8 (eight) hours. 05/12/21   Abner Greenspan, Amy L, PA  divalproex (DEPAKOTE) 500 MG DR tablet Take 500 mg by mouth at bedtime. 08/06/20   [provider]  lactulose (CHRONULAC) 10 GM/15ML solution Take 15 mLs (10 g total) by mouth daily as needed for up to 3 days for mild constipation. 05/15/21 05/18/21  Hans Eden, NP  risperiDONE (RISPERDAL) 3 MG tablet Take 3 mg by mouth 2 (two) times daily. 08/02/20   [provider]  senna-docusate (SENOKOT-S) 8.6-50 MG tablet Take 1 tablet by mouth daily. 05/15/21   Hans Eden, NP    Allergies    Metformin and related  Review of Systems   Review of Systems  Constitutional:  Negative for appetite change and fatigue.  HENT:  Negative for congestion, ear discharge and sinus pressure.   Eyes:  Negative for discharge.  Respiratory:  Negative for cough.   Cardiovascular:  Negative for chest pain.  Gastrointestinal:  Positive for abdominal pain. Negative for anorexia and diarrhea.  Genitourinary:  Negative for frequency and hematuria.  Musculoskeletal:  Negative for back pain.  Skin:  Negative for rash.  Neurological:  Negative for seizures and headaches.  Psychiatric/Behavioral:  Negative for hallucinations.    Physical Exam Updated Vital Signs BP (!) 110/58 (BP Location: Right Arm)    Pulse 79    Temp 98.3 F (36.8 C) (Oral)    Resp 15    Ht 6\' 1"  (1.854 m)    Wt 111 kg    SpO2 100%    BMI 32.29 kg/m   Physical Exam Vitals and nursing note reviewed.  Constitutional:      Appearance: He is well-developed.  HENT:     Head: Normocephalic.     Nose: Nose normal.  Eyes:     General: No scleral icterus.    Conjunctiva/sclera: Conjunctivae normal.  Neck:     Thyroid: No thyromegaly.  Cardiovascular:     Rate and Rhythm: Normal rate and regular  rhythm.     Heart sounds: No murmur heard.   No friction rub. No gallop.  Pulmonary:     Breath sounds: No stridor. No wheezing or rales.  Chest:     Chest wall: No tenderness.  Abdominal:     General: There is no distension.     Tenderness: There is abdominal tenderness. There is no rebound.     Comments: Tender suprapubic  Musculoskeletal:        General: Normal range of motion.     Cervical back: Neck supple.  Lymphadenopathy:  Cervical: No cervical adenopathy.  Skin:    Findings: No erythema or rash.  Neurological:     Mental Status: He is alert and oriented to person, place, and time.     Motor: No abnormal muscle tone.     Coordination: Coordination normal.  Psychiatric:        Behavior: Behavior normal.    ED Results / Procedures / Treatments   Labs (all labs ordered are listed, but only abnormal results are displayed) Labs Reviewed  CBC WITH DIFFERENTIAL/PLATELET - Abnormal; Notable for the following components:      Result Value   WBC 10.6 (*)    Platelets 148 (*)    Monocytes Absolute 1.3 (*)    All other components within normal limits  COMPREHENSIVE METABOLIC PANEL - Abnormal; Notable for the following components:   Sodium 134 (*)    CO2 21 (*)    Glucose, Bld 100 (*)    All other components within normal limits  URINALYSIS, ROUTINE W REFLEX MICROSCOPIC - Abnormal; Notable for the following components:   Color, Urine ORANGE (*)    Specific Gravity, Urine >1.030 (*)    Hgb urine dipstick MODERATE (*)    Bilirubin Urine SMALL (*)    Ketones, ur 15 (*)    Protein, ur 100 (*)    All other components within normal limits  URINALYSIS, MICROSCOPIC (REFLEX) - Abnormal; Notable for the following components:   Bacteria, UA RARE (*)    All other components within normal limits  URINE CULTURE  LIPASE, BLOOD    EKG None  Radiology CT ABDOMEN PELVIS W CONTRAST  Result Date: 05/16/2021 CLINICAL DATA:  Lower abdominal pain for 2 days, initial encounter  EXAM: CT ABDOMEN AND PELVIS WITH CONTRAST TECHNIQUE: Multidetector CT imaging of the abdomen and pelvis was performed using the standard protocol following bolus administration of intravenous contrast. CONTRAST:  90 mL Omnipaque 300 COMPARISON:  04/01/2020 FINDINGS: Lower chest: No acute abnormality. Hepatobiliary: Fatty infiltration of the liver is noted. No focal mass is seen. Gallbladder is within normal limits. Pancreas: Unremarkable. No pancreatic ductal dilatation or surrounding inflammatory changes. Spleen: Normal in size without focal abnormality. Adrenals/Urinary Tract: Adrenal glands are within normal limits. Kidneys demonstrate a normal enhancement pattern bilaterally. No renal calculi are seen. No obstructive changes are seen. The bladder is decompressed. Stomach/Bowel: There are changes of sigmoid diverticulitis identified with considerable wall edema identified. No definitive perforation or abscess is identified. More proximal colon appears within normal limits. The appendix is unremarkable. Small bowel and stomach are within normal limits. Vascular/Lymphatic: No significant vascular findings are present. No enlarged abdominal or pelvic lymph nodes. Reproductive: Prostate is unremarkable. Other: No abdominal wall hernia or abnormality. No abdominopelvic ascites. Musculoskeletal: No acute or significant osseous findings. IMPRESSION: Changes of sigmoid diverticulitis with considerable wall edema identified. No definitive abscess or perforation is seen. Fatty liver. Electronically Signed   By: Inez Catalina M.D.   On: 05/16/2021 19:15    Procedures Procedures   Medications Ordered in ED Medications  acetaminophen (TYLENOL) tablet 1,000 mg (1,000 mg Oral Given 05/16/21 1925)  sodium chloride 0.9 % bolus 500 mL (0 mLs Intravenous Stopped 05/16/21 2040)  iohexol (OMNIPAQUE) 300 MG/ML solution 90 mL (90 mLs Intravenous Contrast Given 05/16/21 1902)  ciprofloxacin (CIPRO) IVPB 400 mg (0 mg Intravenous  Stopped 05/16/21 2205)  metroNIDAZOLE (FLAGYL) IVPB 500 mg (0 mg Intravenous Stopped 05/16/21 2216)    ED Course  I have reviewed the triage vital signs and the  nursing notes.  Pertinent labs & imaging results that were available during my care of the patient were reviewed by me and considered in my medical decision making (see chart for details).    MDM Rules/Calculators/A&P                         Patient with mild diverticulitis.  He is placed on Flagyl and Cipro and will follow up with his family doctor    Final Clinical Impression(s) / ED Diagnoses Final diagnoses:  Diverticulitis    Rx / DC Orders ED Discharge Orders          Ordered    metroNIDAZOLE (FLAGYL) 500 MG tablet  4 times daily        05/16/21 2225    ciprofloxacin (CIPRO) 500 MG tablet  2 times daily        05/16/21 2225    HYDROcodone-acetaminophen (NORCO/VICODIN) 5-325 MG tablet  Every 6 hours PRN        05/16/21 2225    ondansetron (ZOFRAN-ODT) 4 MG disintegrating tablet        05/16/21 2225             Milton Ferguson, MD 05/20/21 1124

## 2021-05-16 NOTE — ED Provider Notes (Signed)
Emergency Medicine Provider Triage Evaluation Note  JOCOB DAMBACH , a 40 y.o. male  was evaluated in triage.  Pt complains of abdominal pain.  Started 2 to 3 days ago, associate with nausea but no vomiting.  Intermittent episodes of diarrhea, lately feels constipated.  Endorses dysuria and some hematuria, abdomen pain is mostly lower.  No previous abdominal procedures, drinks alcohol occasionally not routinely.  No history of heavy anti-inflammatory use, no rectal bleeding.  Patient tested positive for COVID 3 days ago..  Review of Systems  Positive: ABOVE Negative: ABOVE  Physical Exam  BP (!) 107/96    Pulse 90    Temp (!) 100.7 F (38.2 C) (Oral)    Resp 18    Ht 6\' 1"  (1.854 m)    Wt 111 kg    SpO2 95%    BMI 32.29 kg/m  Gen:   Awake, no distress   Resp:  Normal effort  MSK:   Moves extremities without difficulty  Other:  Suprapubic abdominal tenderness, abdomen slightly distended but soft  Medical Decision Making  Medically screening exam initiated at 5:45 PM.  Appropriate orders placed.  Cristiano C Agyeman was informed that the remainder of the evaluation will be completed by another provider, this initial triage assessment does not replace that evaluation, and the importance of remaining in the ED until their evaluation is complete.  AB LABS   Sherrill Raring, PA-C 05/16/21 1747    Elnora Morrison, MD 05/20/21 [phone]

## 2021-05-17 LAB — URINE CULTURE: Culture: <10000 — AB

## 2021-05-22 ENCOUNTER — Emergency Department (HOSPITAL_COMMUNITY): Payer: Medicare (Managed Care)

## 2021-05-22 ENCOUNTER — Emergency Department (HOSPITAL_COMMUNITY)
Admission: EM | Admit: 2021-05-22 | Discharge: 2021-05-22 | Disposition: A | Discharge status: Home / Self Care | Payer: Medicare (Managed Care) | Attending: Emergency Medicine | Admitting: Emergency Medicine

## 2021-05-22 ENCOUNTER — Other Ambulatory Visit: Payer: Self-pay

## 2021-05-22 ENCOUNTER — Ambulatory Visit: Payer: Medicare (Managed Care)

## 2021-05-22 DIAGNOSIS — R519 Headache, unspecified: Secondary | ICD-10-CM | POA: Diagnosis not present

## 2021-05-22 DIAGNOSIS — K5732 Diverticulitis of large intestine without perforation or abscess without bleeding: Secondary | ICD-10-CM | POA: Insufficient documentation

## 2021-05-22 DIAGNOSIS — K5900 Constipation, unspecified: Secondary | ICD-10-CM

## 2021-05-22 DIAGNOSIS — R1084 Generalized abdominal pain: Secondary | ICD-10-CM | POA: Diagnosis present

## 2021-05-22 DIAGNOSIS — F17210 Nicotine dependence, cigarettes, uncomplicated: Secondary | ICD-10-CM | POA: Insufficient documentation

## 2021-05-22 DIAGNOSIS — K5792 Diverticulitis of intestine, part unspecified, without perforation or abscess without bleeding: Secondary | ICD-10-CM

## 2021-05-22 LAB — CBC WITH DIFFERENTIAL/PLATELET
Abs Immature Granulocytes: 0 10*3/uL (ref 0.00–0.07)
Basophils Absolute: 0 10*3/uL (ref 0.0–0.1)
Basophils Relative: 0 %
Eosinophils Absolute: 0 10*3/uL (ref 0.0–0.5)
Eosinophils Relative: 0 %
HCT: 37.8 % — ABNORMAL LOW (ref 39.0–52.0)
Hemoglobin: 12.4 g/dL — ABNORMAL LOW (ref 13.0–17.0)
Lymphocytes Relative: 41 %
Lymphs Abs: 1.8 10*3/uL (ref 0.7–4.0)
MCH: 28.9 pg (ref 26.0–34.0)
MCHC: 32.8 g/dL (ref 30.0–36.0)
MCV: 88.1 fL (ref 80.0–100.0)
Monocytes Absolute: 0.3 10*3/uL (ref 0.1–1.0)
Monocytes Relative: 7 %
Neutro Abs: 2.2 10*3/uL (ref 1.7–7.7)
Neutrophils Relative %: 52 %
Platelets: 247 10*3/uL (ref 150–400)
RBC: 4.29 MIL/uL (ref 4.22–5.81)
RDW: 14.1 % (ref 11.5–15.5)
WBC: 4.3 10*3/uL (ref 4.0–10.5)
nRBC: 0 % (ref 0.0–0.2)
nRBC: 0 /100 WBC

## 2021-05-22 LAB — COMPREHENSIVE METABOLIC PANEL
ALT: 72 U/L — ABNORMAL HIGH (ref 0–44)
AST: 80 U/L — ABNORMAL HIGH (ref 15–41)
Albumin: 3.8 g/dL (ref 3.5–5.0)
Alkaline Phosphatase: 44 U/L (ref 38–126)
Anion gap: 8 (ref 5–15)
BUN: 9 mg/dL (ref 6–20)
CO2: 24 mmol/L (ref 22–32)
Calcium: 9 mg/dL (ref 8.9–10.3)
Chloride: 104 mmol/L (ref 98–111)
Creatinine, Ser: 1.06 mg/dL (ref 0.61–1.24)
GFR, Estimated: >60 mL/min (ref >60)
Glucose, Bld: 118 mg/dL — ABNORMAL HIGH (ref 70–99)
Potassium: 3.7 mmol/L (ref 3.5–5.1)
Sodium: 136 mmol/L (ref 135–145)
Total Bilirubin: 0.5 mg/dL (ref 0.3–1.2)
Total Protein: 7.1 g/dL (ref 6.5–8.1)

## 2021-05-22 LAB — URINALYSIS, ROUTINE W REFLEX MICROSCOPIC
Bacteria, UA: NONE SEEN
Bilirubin Urine: NEGATIVE
Glucose, UA: NEGATIVE mg/dL
Hgb urine dipstick: NEGATIVE
Ketones, ur: NEGATIVE mg/dL
Nitrite: NEGATIVE
Protein, ur: NEGATIVE mg/dL
Specific Gravity, Urine: 1.013 (ref 1.005–1.030)
pH: 7 (ref 5.0–8.0)

## 2021-05-22 LAB — LIPASE, BLOOD: Lipase: 27 U/L (ref 11–51)

## 2021-05-22 MED ORDER — IOHEXOL 300 MG/ML  SOLN
100.0000 mL | Freq: Once | INTRAMUSCULAR | Status: AC | PRN
  Administered 2021-05-22: 19:00:00 100 mL via INTRAVENOUS

## 2021-05-22 MED ORDER — ACETAMINOPHEN 500 MG PO TABS
1000.0000 mg | ORAL_TABLET | Freq: Once | ORAL | Status: AC
  Administered 2021-05-22: 18:00:00 1000 mg via ORAL
  Filled 2021-05-22: qty 2

## 2021-05-22 NOTE — Progress Notes (Unsigned)
Current Barriers:  {pharmacybarriers:24917}  Pharmacist Clinical Goal(s):  Patient will {PHARMACYGOALCHOICES:24921} through collaboration with PharmD and provider.   Interventions: 1:1 collaboration with [name], FNP regarding development and update of comprehensive plan of care as evidenced by provider attestation and co-signature Inter-disciplinary care team collaboration (see longitudinal plan of care) Comprehensive medication review performed; medication list updated in electronic medical record  Hypertension (BP goal <130/80) -{US controlled/uncontrolled:25276} -Current treatment: *** -Medications previously tried: ***  -Current home readings: *** -Current dietary habits: *** -Current exercise habits: *** -{ACTIONS;DENIES/REPORTS:21021675::"Denies"} hypotensive/hypertensive symptoms -Educated on {CCM BP Counseling:25124} -Counseled to monitor BP at home ***, document, and provide log at future appointments -{CCMPHARMDINTERVENTION:25122}  Diabetes (A1c goal {A1c goals:23924}) -{US controlled/uncontrolled:25276} -Current medications: *** -Medications previously tried: ***  -Current home glucose readings fasting glucose: *** post prandial glucose: *** -{ACTIONS;DENIES/REPORTS:21021675::"Denies"} hypoglycemic/hyperglycemic symptoms -Current meal patterns:  breakfast: ***  lunch: ***  dinner: *** snacks: *** drinks: *** -Current exercise: *** -Educated on {CCM DM COUNSELING:25123} -Counseled to check feet daily and get yearly eye exams -{CCMPHARMDINTERVENTION:25122}  Patient Goals/Self-Care Activities Patient will:  - {pharmacypatientgoals:24919}  Follow Up Plan: {CM FOLLOW UP PTWS:56812}  Chronic Care Management Pharmacy Note  05/22/2021 Name:  Scott Farrell MRN:  [id number] DOB:  01-15-1981  Summary: ***  Recommendations/Changes made from today's visit: ***  Plan: ***   Subjective: Scott Farrell is an 40 y.o. year old male who is a  primary patient of [name], [place].  The CCM team was consulted for assistance with disease management and care coordination needs.    Engaged with patient by telephone for follow up visit in response to provider referral for pharmacy case management and/or care coordination services. Patient reports that he recently had COVID and then was having issues with constipation.   Consent to Services:  {CCMCONSENTOPTIONS:25074}  Patient Care Team: [name], FNP as PCP - General (General Practice) Mayford Knife, [facility] (Pharmacist)  Recent office visits: ***  Recent consult visits: [facility] visits: {Hospital DC Yes/No:25215}   Objective:  Lab Results  Component Value Date   CREATININE 1.19 05/16/2021   BUN 14 05/16/2021   GFRNONAA >60 05/16/2021   GFRAA 101 05/27/2020   NA 134 (L) 05/16/2021   K 3.7 05/16/2021   CALCIUM 9.1 05/16/2021   CO2 21 (L) 05/16/2021   GLUCOSE 100 (H) 05/16/2021    Lab Results  Component Value Date/Time   HGBA1C 5.7 (H) 09/03/2020 04:40 PM   HGBA1C 6.0 (H) 05/27/2020 03:52 PM    Last diabetic Eye exam: No results found for: HMDIABEYEEXA  Last diabetic Foot exam: No results found for: HMDIABFOOTEX   Lab Results  Component Value Date   CHOL 142 12/16/2020   HDL 27 (L) 12/16/2020   LDLCALC 86 12/16/2020   TRIG 164 (H) 12/16/2020   CHOLHDL 5.3 (H) 12/16/2020    Hepatic Function Latest Ref Rng & Units 05/16/2021 05/27/2020 09/27/2019  Total Protein 6.5 - 8.1 g/dL 7.5 7.5 7.2  Albumin 3.5 - 5.0 g/dL 4.1 4.8 4.8  AST 15 - 41 U/L '19 22 26  ' ALT 0 - 44 U/L '17 19 19  ' Alk Phosphatase 38 - 126 U/L 48 80 70  Total Bilirubin 0.3 - 1.2 mg/dL 1.1 0.4 0.3    Lab Results  Component Value Date/Time   TSH 2.680 09/27/2019 11:47 AM   TSH 1.471 11/11/2010 04:02 PM    CBC Latest Ref Rng & Units 05/16/2021 08/12/2020 09/27/2019  WBC 4.0 - 10.5 K/uL 10.6(H) 5.3 4.9  Hemoglobin 13.0 - 17.0 g/dL 13.5 14.1 14.2  Hematocrit 39.0 - 52.0 % 39.1 41.2  40.5  Platelets 150 - 400 K/uL 148(L) 170 180    No results found for: VD25OH  Clinical ASCVD: {YES/NO:21197} The 10-year ASCVD risk score (Arnett DK, et al., 2019) is: 4.4%   Values used to calculate the score:     Age: 40 years     Sex: Male     Is Non-Hispanic African American: Yes     Diabetic: No     Tobacco smoker: Yes     Systolic Blood Pressure: 446 mmHg     Is BP treated: No     HDL Cholesterol: 27 mg/dL     Total Cholesterol: 142 mg/dL    Depression screen [facility] 2/9 09/03/2020 07/03/2020 09/27/2019  Decreased Interest 0 1 2  Down, Depressed, Hopeless 0 1 1  PHQ - 2 Score 0 2 3  Altered sleeping - 1 1  Tired, decreased energy - 2 1  Change in appetite - 0 1  Feeling bad or failure about yourself  - 1 1  Trouble concentrating - 0 0  Moving slowly or fidgety/restless - 0 1  Suicidal thoughts - 0 0  PHQ-9 Score - 6 8  Difficult doing work/chores - Somewhat difficult Not difficult at all     ***Other: (CHADS2VASc if Afib, MMRC or CAT for COPD, ACT, DEXA)  Social History   Tobacco Use  Smoking Status Some Days   Packs/day: 0.25   Years: 23.00   Pack years: 5.75   Types: Cigars, Cigarettes  Smokeless Tobacco Never  Tobacco Comments   1-2 black milds daily; started back smoking cigarettes.    BP Readings from Last 3 Encounters:  05/16/21 (!) 110/58  05/15/21 118/72  05/12/21 122/72   Pulse Readings from Last 3 Encounters:  05/16/21 79  05/15/21 75  05/12/21 68   Wt Readings from Last 3 Encounters:  05/16/21 244 lb 11.4 oz (111 kg)  02/05/21 245 lb (111.1 kg)  12/16/20 246 lb 3.2 oz (111.7 kg)   BMI Readings from Last 3 Encounters:  05/16/21 32.29 kg/m  02/05/21 32.32 kg/m  12/16/20 32.48 kg/m    Assessment/Interventions: Review of patient past medical history, allergies, medications, health status, including review of consultants reports, laboratory and other test data, was performed as part of comprehensive evaluation and provision of chronic care  management services.   SDOH:  (Social Determinants of Health) assessments and interventions performed: {yes/no:20286}  SDOH Screenings   Alcohol Screen: Not on file  Depression (PHQ2-9): Low Risk    PHQ-2 Score: 0  Financial Resource Strain: Low Risk    Difficulty of Paying Living Expenses: Not hard at all  Food Insecurity: Food Insecurity Present   Worried About Charity fundraiser in the Last Year: Sometimes true   Arboriculturist in the Last Year: Sometimes true  Housing: Medium Risk   Last Housing Risk Score: 1  Physical Activity: Inactive   Days of Exercise per Week: 0 days   Minutes of Exercise per Session: 0 min  Social Connections: Not on file  Stress: Stress Concern Present   Feeling of Stress : To some extent  Tobacco Use: High Risk   Smoking Tobacco Use: Some Days   Smokeless Tobacco Use: Never   Passive Exposure: Not on file  Transportation Needs: No Transportation Needs   Lack of Transportation (Medical): No   Lack of Transportation (Non-Medical): No    CCM Care  Plan  Allergies  Allergen Reactions   Metformin And Related Other (See Comments)    Chest tightness    Medications Reviewed Today     Reviewed by Oren Bracket (Pharmacy Technician) on 05/16/21 at [place] List Status: F/U 2nd Shift   Medication Order Taking? Sig Documenting Provider Last Dose Status Informant  alum & mag hydroxide-simeth (MAALOX PLUS) 015-615-37 MG/5ML suspension [id number]  Take 15 mLs by mouth every 6 (six) hours as needed for indigestion. Hans Eden, NP  Active   benzonatate (TESSALON) 100 MG capsule [id number]  Take 1 capsule (100 mg total) by mouth every 8 (eight) hours. Abner Greenspan, Amy L, PA  Active   divalproex (DEPAKOTE) 500 MG DR tablet [id number]  Take 500 mg by mouth at bedtime. [provider]  Active   lactulose (CHRONULAC) 10 GM/15ML solution [id number]  Take 15 mLs (10 g total) by mouth daily as needed for up to 3 days for mild constipation.  Hans Eden, NP  Active   risperiDONE (RISPERDAL) 3 MG tablet [id number]  Take 3 mg by mouth 2 (two) times daily. [provider]  Active   senna-docusate (SENOKOT-S) 8.6-50 MG tablet [id number]  Take 1 tablet by mouth daily. Hans Eden, NP  Active             Patient Active Problem List   Diagnosis Date Noted   Chest pain of uncertain etiology 85/90/9311   Tobacco abuse 09/19/2020   Prediabetes 09/13/2020   High triglycerides 09/13/2020   Other headache syndrome 07/31/2019   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/31/2019   Lightheaded 07/31/2019   Callus of foot 01/27/2018   Unspecified constipation 04/27/2012   Family history of malignant neoplasm of gastrointestinal tract 04/27/2012   Gastric mass 04/21/2012    Immunization History  Administered Date(s) Administered   Influenza Inj Mdck Quad Pf 06/02/2018   Influenza-Unspecified 03/02/2020   Moderna Sars-Covid-2 Vaccination 09/18/2019, 10/19/2019, 05/18/2020    Conditions to be addressed/monitored:  {USCCMDZASSESSMENTOPTIONS:23563}  There are no care plans that you recently modified to display for this patient.    Medication Assistance: {MEDASSISTANCEINFO:25044}  Compliance/Adherence/Medication fill history: Care Gaps: COVID-19 Vaccine Pneumococcal Vaccine COLONOSCOPY Influenza Vaccine  Star-Rating Drugs: ***  Patient's preferred pharmacy is:  [id number] [place], [place] - [id number] [id number]: [phone]: [phone] Uses pill box? {Yes or If no, why not?:20788} Pt endorses ***% compliance  We discussed: {Pharmacy options:24294} Patient decided to: {US Pharmacy Plan:23885}  Care Plan and Follow Up Patient Decision:  {FOLLOWUP:24991}  Plan: {CM FOLLOW UP PLAN:25073}  ***

## 2021-05-22 NOTE — ED Triage Notes (Signed)
Pt reports continued lower abdominal pain, somewhat improved since he was seen for same on 12/16. Also reports his "brain hurts."

## 2021-05-22 NOTE — ED Provider Notes (Signed)
Emergency Medicine Provider Triage Evaluation Note  Scott Farrell , a 40 y.o. male  was evaluated in triage.  Pt complains of left lower quadrant abdominal pain and his "brain hurts."  Patient states that his abdominal pain has somewhat improved when he was seen on 12/16.  Patient states that pain improves when he takes his prescribed hydrocodone.  Patient endorses constipation.  Patient states that he presents for all the amount of stool earlier this morning.  Patient reports that he has seen some blood mixed in with the stool.  When asked patient states he is unsure if he has had any blood in his urine.  Patient states he has been taking his prescribed antibiotics, hydrocodone, and stool softeners.  Patient reports that headache has been present over the last 4 days.  Denies any aggravating factors.  Denies any numbness, weakness, neck pain, neck stiffness, visual disturbance.  Review of Systems  Positive: Abdominal pain, headache, constipation, blood in stool, chills Negative: Fever, diarrhea, melena, nausea, vomiting, dysuria, urinary urgency, numbness, weakness, visual disturbance, neck pain, neck stiffness, facial asymmetry, dysarthria  Physical Exam  BP 125/83 (BP Location: Left Arm)    Pulse 76    Temp 98.4 F (36.9 C) (Oral)    Resp 18    SpO2 97%  Gen:   Awake, no distress   Resp:  Normal effort  MSK:   Moves extremities without difficulty  Other:  Abdomen soft, nondistended, tenderness to left lower quadrant.  No facial asymmetry or dysarthria.  Patient was a limbs equally without difficulty.  Medical Decision Making  Medically screening exam initiated at 3:16 PM.  Appropriate orders placed.  Scott Farrell was informed that the remainder of the evaluation will be completed by another provider, this initial triage assessment does not replace that evaluation, and the importance of remaining in the ED until their evaluation is complete.     Loni Beckwith,  PA-C 05/22/21 1519    Lorelle Gibbs, DO 05/23/21 1529

## 2021-05-22 NOTE — Discharge Instructions (Signed)
Please follow-up with your primary care doctor.  Continue the antibiotics as was previously prescribed.  Come back to ER if you develop worsening abdominal pain, vomiting, fever or other new concerning symptom.  For constipation would additionally recommend taking MiraLAX.

## 2021-05-22 NOTE — ED Provider Notes (Signed)
[facility] EMERGENCY DEPARTMENT Provider Note   CSN: [id number] Arrival date & time: 05/22/21  1452     History Chief Complaint  Patient presents with   Abdominal Pain   Headache    Scott Farrell is a 40 y.o. male.  Presenting to the emergency room with concern for abdominal pain as well as headache and constipation.  Patient states that since starting the antibiotics he feels his abdominal pain had some improvement but he is still having some lingering abdominal discomfort, described as generalized pain.  Has not had any chills or fevers no nausea or vomiting.  Has also had constipation.  States he still has a few days of antibiotics left.  States for the last few days he has had dull achy moderate headache.  Not sudden onset, not worst headache of his life.  No associated neck pain or stiffness, no speech or vision change numbness or weakness in his extremities.  HPI     Past Medical History:  Diagnosis Date   Clotting disorder ([place])    personal hx/o? etiology unclear   Costochondritis    history of   Herpes genitalia 1/12   History of echocardiogram 01/07/11   normal LV function, EF 60%, trace mitral, tricuspid and pulmonic regurgitation; Dr. Tollie Eth   IBS (irritable bowel syndrome)    Mental disorder 2013   hospitalized for psychiatric illness prior   Normal cardiac stress test 01/07/11   negative treadmill test, no evidence of ischemia, good exercise capacity; Tollie Eth, MD;    Prostatitis 11/2010   Dr. Karsten Ro   Renal stone 06/2012   Urology consult   Schizophrenia [facility])    [facility] Psychiatry, [facility] - Dr. Lennon Alstrom   Tobacco use disorder     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   Lightheaded 07/31/2019   Callus of  foot 01/27/2018   Unspecified constipation 04/27/2012   Family history of malignant neoplasm of gastrointestinal tract 04/27/2012   Gastric mass 04/21/2012    Past Surgical History:  Procedure Laterality Date   EUS  06/30/2012   Procedure: UPPER ENDOSCOPIC ULTRASOUND (EUS) LINEAR;  Surgeon: Milus Banister, MD;  Location: [facility];  Service: Endoscopy;  Laterality: N/A;  radial linear   WISDOM TOOTH EXTRACTION         Family History  Problem Relation Age of Onset   Hypertension Mother    Stroke Mother    Depression Mother    Acne Mother    Thyroid disease Mother    Diabetes Mother    Colon cancer Mother 38   Acute lymphoblastic leukemia Father    Diabetes Father    Cancer Brother    Breast cancer Maternal Aunt    Ovarian cancer Maternal Aunt     Social History   Tobacco Use   Smoking status: Some Days    Packs/day: 0.25    Years: 23.00    Pack years: 5.75    Types: Cigars, Cigarettes   Smokeless tobacco: Never   Tobacco comments:    1-2 black milds daily; started back smoking cigarettes.   Vaping Use   Vaping Use: Former   Substances: Nicotine  Substance Use Topics   Alcohol use: Yes    Comment: occ   Drug use: Not Currently  Home Medications Prior to Admission medications   Medication Sig Start Date End Date Taking? Authorizing Provider  alum & mag hydroxide-simeth (MAALOX PLUS) 400-400-40 MG/5ML suspension Take 15 mLs by mouth every 6 (six) hours as needed for indigestion. 05/15/21  Yes White, Adrienne R, NP  benzonatate (TESSALON) 100 MG capsule Take 1 capsule (100 mg total) by mouth every 8 (eight) hours. Patient taking differently: Take 100 mg by mouth 3 (three) times daily as needed for cough. 05/12/21  Yes Arruda, Amy L, PA  ciprofloxacin (CIPRO) 500 MG tablet Take 1 tablet (500 mg total) by mouth 2 (two) times daily. One po bid x 7 days 05/16/21  Yes Milton Ferguson, MD  divalproex (DEPAKOTE) 500 MG DR tablet Take 500 mg by mouth at bedtime.  08/06/20  Yes [provider]  HYDROcodone-acetaminophen (NORCO/VICODIN) 5-325 MG tablet Take 1 tablet by mouth every 6 (six) hours as needed for moderate pain. 05/16/21  Yes Milton Ferguson, MD  metroNIDAZOLE (FLAGYL) 500 MG tablet Take 1 tablet (500 mg total) by mouth 4 (four) times daily. One po bid x 7 days 05/16/21  Yes Milton Ferguson, MD  ondansetron (ZOFRAN-ODT) 4 MG disintegrating tablet 4mg  ODT q4 hours prn nausea/vomit 05/16/21  Yes Milton Ferguson, MD  risperidone (RISPERDAL) 4 MG tablet Take 4 mg by mouth 2 (two) times daily. 03/24/21  Yes [provider]  senna-docusate (SENOKOT-S) 8.6-50 MG tablet Take 1 tablet by mouth daily. 05/15/21  Yes White, Leitha Schuller, NP    Allergies    Metformin and related  Review of Systems   Review of Systems  Constitutional:  Negative for chills and fever.  HENT:  Negative for ear pain and sore throat.   Eyes:  Negative for pain and visual disturbance.  Respiratory:  Negative for cough and shortness of breath.   Cardiovascular:  Negative for chest pain and palpitations.  Gastrointestinal:  Positive for abdominal pain and constipation. Negative for nausea and vomiting.  Genitourinary:  Negative for dysuria and hematuria.  Musculoskeletal:  Negative for arthralgias and back pain.  Skin:  Negative for color change and rash.  Neurological:  Negative for seizures and syncope.  All other systems reviewed and are negative.  Physical Exam Updated Vital Signs BP 125/76    Pulse (!) 49    Temp 98 F (36.7 C)    Resp (!) 29    SpO2 98%   Physical Exam Vitals and nursing note reviewed.  Constitutional:      General: He is not in acute distress.    Appearance: He is well-developed.  HENT:     Head: Normocephalic and atraumatic.  Eyes:     Conjunctiva/sclera: Conjunctivae normal.  Cardiovascular:     Rate and Rhythm: Normal rate and regular rhythm.     Heart sounds: No murmur heard. Pulmonary:     Effort: Pulmonary effort is  normal. No respiratory distress.     Breath sounds: Normal breath sounds.  Abdominal:     Palpations: Abdomen is soft.     Tenderness: There is abdominal tenderness in the right lower quadrant and left lower quadrant.  Musculoskeletal:        General: No swelling.     Cervical back: Neck supple.  Skin:    General: Skin is warm and dry.     Capillary Refill: Capillary refill takes less than 2 seconds.  Neurological:     General: No focal deficit present.     Mental Status: He is alert.  Psychiatric:  Mood and Affect: Mood normal.    ED Results / Procedures / Treatments   Labs (all labs ordered are listed, but only abnormal results are displayed) Labs Reviewed  COMPREHENSIVE METABOLIC PANEL - Abnormal; Notable for the following components:      Result Value   Glucose, Bld 118 (*)    AST 80 (*)    ALT 72 (*)    All other components within normal limits  CBC WITH DIFFERENTIAL/PLATELET - Abnormal; Notable for the following components:   Hemoglobin 12.4 (*)    HCT 37.8 (*)    All other components within normal limits  URINALYSIS, ROUTINE W REFLEX MICROSCOPIC - Abnormal; Notable for the following components:   Leukocytes,Ua TRACE (*)    All other components within normal limits  LIPASE, BLOOD    EKG None  Radiology CT ABDOMEN PELVIS W CONTRAST  Result Date: 05/22/2021 CLINICAL DATA:  Left lower quadrant abdominal pain. Diverticulitis suspected. EXAM: CT ABDOMEN AND PELVIS WITH CONTRAST TECHNIQUE: Multidetector CT imaging of the abdomen and pelvis was performed using the standard protocol following bolus administration of intravenous contrast. CONTRAST:  122mL OMNIPAQUE IOHEXOL 300 MG/ML  SOLN COMPARISON:  Abdominopelvic CT 05/16/2021 and 04/01/2020. FINDINGS: Lower chest: Clear lung bases. No significant pleural or pericardial effusion. The liver appears unchanged without focal abnormality. Hepatobiliary: The liver is normal in density without suspicious focal abnormality.  No evidence of gallstones, gallbladder wall thickening or biliary dilatation. Pancreas: Unremarkable. No pancreatic ductal dilatation or surrounding inflammatory changes. Spleen: Normal in size without focal abnormality. Multiple small splenules are again noted. Adrenals/Urinary Tract: Both adrenal glands appear normal. The kidneys appear normal without evidence of urinary tract calculus, suspicious lesion or hydronephrosis. No bladder abnormalities are seen. Stomach/Bowel: No enteric contrast administered. The stomach appears normal for its degree of distention. The small bowel, appendix and proximal colon appear normal aside from prominent stool throughout the colon. The previously demonstrated wall thickening of the sigmoid colon and surrounding inflammatory changes have improved. No evidence of bowel obstruction, perforation or abscess. Vascular/Lymphatic: 1.9 x 1.9 cm soft tissue nodule in the gastrohepatic ligament on image 15/5 is unchanged from CT 04/01/2020. Small lymph nodes in the porta hepatis are stable. No retroperitoneal adenopathy. No acute vascular findings. The portal, superior mesenteric and splenic veins are patent. Reproductive: The prostate gland and seminal vesicles appear unremarkable. Other: No evidence of abdominal wall mass or hernia. No ascites. Musculoskeletal: No acute or significant osseous findings. IMPRESSION: 1. Improvement in previously demonstrated changes of acute sigmoid diverticulitis. No evidence of bowel perforation, abscess or obstruction. 2. No new findings are seen. 3. Stable soft tissue nodule in the gastrohepatic ligament. 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 Electronically Signed   By: Richardean Sale M.D.   On: 05/22/2021 19:40    Procedures Procedures   Medications Ordered in ED Medications  acetaminophen (TYLENOL) tablet 1,000 mg (1,000 mg Oral Given 05/22/21 1821)  iohexol (OMNIPAQUE) 300 MG/ML  solution 100 mL (100 mLs Intravenous Contrast Given 05/22/21 1903)    ED Course  I have reviewed the triage vital signs and the nursing notes.  Pertinent labs & imaging results that were available during my care of the patient were reviewed by me and considered in my medical decision making (see chart for details).    MDM Rules/Calculators/A&P  40 year old gentleman presents to ER with concern for persistent abdominal pain as well as constipation and headache in the setting of recently diagnosed diverticulitis.  Patient endorses compliance with the Cipro and Flagyl that were prescribed.  He is well-appearing, his blood work is stable.  No fever.  Did have some tenderness on exam.  Repeated CT today to assess for any complications or progression of his disease.  Thankfully CT scan demonstrated improvement.  For constipation recommended MiraLAX for now.  Patient is well-appearing and pain is currently well controlled.  Recommended recheck with primary doctor, discharged.  After the discussed management above, the patient was determined to be safe for discharge.  The patient was in agreement with this plan and all questions regarding their care were answered.  ED return precautions were discussed and the patient will return to the ED with any significant worsening of condition.    Final Clinical Impression(s) / ED Diagnoses Final diagnoses:  Diverticulitis  Constipation, unspecified constipation type    Rx / DC Orders ED Discharge Orders     None        Lucrezia Starch, MD 05/23/21 1500

## 2021-05-29 ENCOUNTER — Encounter: Payer: Medicare Other | Admitting: Nurse Practitioner

## 2021-05-29 NOTE — Patient Instructions (Signed)

## 2021-05-29 NOTE — Progress Notes (Signed)
Was not seen.

## 2021-06-03 ENCOUNTER — Telehealth: Payer: Medicare (Managed Care)

## 2021-06-18 ENCOUNTER — Telehealth: Payer: Self-pay

## 2021-06-18 NOTE — Chronic Care Management (AMB) (Signed)
°  Hutch C Brightwell was reminded to have all medications, supplements and any blood glucose and blood pressure readings available for review with [name], Pharm. D, at his telephone visit on 06-27-2021 at 11:00   Questions: Have you had any recent office visit or specialist visit outside of [facility]? Patient stated no.  Are there any concerns you would like to discuss during your office visit? Patient stated no.  Are you having any problems obtaining your medications? (Whether it pharmacy issues or cost) Patient stated no  If patient has any PAP medications ask if they are having any problems getting their PAP medication or refill? No PAP medications.  Care Gaps: PNA Vac overdue Tdap overdue Colonoscopy overdue Covid booster overdue Flu vaccine overdue  Star Rating Drug: None  Any gaps in medications fill history?No  [place] Pharmacist Assistant [phone]

## 2021-06-24 ENCOUNTER — Telehealth: Payer: Medicare (Managed Care)

## 2021-06-27 ENCOUNTER — Telehealth: Payer: Medicare (Managed Care)

## 2021-07-04 ENCOUNTER — Telehealth: Payer: Medicare (Managed Care)

## 2021-07-07 ENCOUNTER — Telehealth: Payer: Self-pay

## 2021-07-07 NOTE — Chronic Care Management (AMB) (Signed)
Chronic Care Management Pharmacy Assistant   Name: Scott Farrell  MRN: [id number] DOB: 1981/03/25  Reason for Encounter: Disease State/ General  Recent office visits:  None  Recent consult visits:  None  Hospital visits:  Medication Reconciliation was completed by comparing discharge summary, patients EMR and Pharmacy list, and upon discussion with patient.  Admitted to the hospital on 05-22-2021 due to Diverticulitis Discharge date was 05-22-2021 Discharged from [place]?Medications Started at [facility] Discharge:?? None  Medication Changes at Hospital Discharge: None  Medications Discontinued at Hospital Discharge: None  Medications that remain the same after Hospital Discharge:??  -All other medications will remain the same.    Hospital visits:  Medication Reconciliation was completed by comparing discharge summary, patients EMR and Pharmacy list, and upon discussion with patient.  Admitted to the hospital on 05-16-2021 due to Diverticulitis Discharge date was 05-16-2021 Discharged from [place]?Medications Started at [facility] Discharge:?? Cipro 500 mg twice daily for 7 days HYDROcodone-acetaminophen 5-325 mg 1 tablet every 6 hours as needed Flagyl 500 mg 4 times daily Zofran 4 mg every 4 hours as needed   Medication Changes at Hospital Discharge: None  Medications Discontinued at Hospital Discharge: None  Medications that remain the same after Hospital Discharge:??  -All other medications will remain the same.    Hospital visits:  Medication Reconciliation was completed by comparing discharge summary, patients EMR and Pharmacy list, and upon discussion with patient.  Admitted to the hospital on 05-15-2021 due to Lower abdominal pain Discharge date was 05-15-2021 Discharged from [facility] health urgent care.  New?Medications Started at [facility] Discharge:?? MAALOX PLUS 400-400-40 mg/5 ml take 15 mls every 6 hours  as needed  Lactulose 10 mg daily as needed Senokot 8.6-50 mg daily  Medication Changes at Hospital Discharge: None  Medications Discontinued at Hospital Discharge: Completed Prednisone and promethazine   Medications that remain the same after Hospital Discharge:??  -All other medications will remain the same.   Hospital visits:  Medication Reconciliation was completed by comparing discharge summary, patients EMR and Pharmacy list, and upon discussion with patient.  Admitted to the hospital on 05-12-2021 due to Viral upper respiratory tract infection Discharge date was 05-12-2021 Discharged from [facility] health urgent care  New?Medications Started at [facility] Discharge:?? Benzonate 100 mg every 8 hours as needed.  Medication Changes at Hospital Discharge: None  Medications Discontinued at Hospital Discharge: None  Medications that remain the same after Hospital Discharge:??  -All other medications will remain the same.     Hospital visits:  Medication Reconciliation was completed by comparing discharge summary, patients EMR and Pharmacy list, and upon discussion with patient.  Admitted to the hospital on 03-12-2021 due to Viral URI with cough Discharge date was 03-12-2021 Discharged from [facility] health urgent care  New?Medications Started at [facility] Discharge:?? Prednisone 40 mg daily Promethazine- DM 5 mls 4 times daily as needed  Medication Changes at Hospital Discharge: None  Medications Discontinued at Hospital Discharge: None  Medications that remain the same after Hospital Discharge:??  -All other medications will remain the same.   Medications: Outpatient Encounter Medications as of 07/07/2021  Medication Sig Note   alum & mag hydroxide-simeth (MAALOX PLUS) 400-400-40 MG/5ML suspension Take 15 mLs by mouth every 6 (six) hours as needed for indigestion.    benzonatate (TESSALON) 100 MG capsule Take 1 capsule (100 mg total) by mouth every 8 (eight) hours. (Patient  taking differently: Take 100 mg by  mouth 3 (three) times daily as needed for cough.)    ciprofloxacin (CIPRO) 500 MG tablet Take 1 tablet (500 mg total) by mouth 2 (two) times daily. One po bid x 7 days 05/22/2021: ABT Start Date 05/17/21.   divalproex (DEPAKOTE) 500 MG DR tablet Take 500 mg by mouth at bedtime.    HYDROcodone-acetaminophen (NORCO/VICODIN) 5-325 MG tablet Take 1 tablet by mouth every 6 (six) hours as needed for moderate pain.    metroNIDAZOLE (FLAGYL) 500 MG tablet Take 1 tablet (500 mg total) by mouth 4 (four) times daily. One po bid x 7 days 05/22/2021: ABT Start Date 05/17/21.    ondansetron (ZOFRAN-ODT) 4 MG disintegrating tablet 4mg  ODT q4 hours prn nausea/vomit    risperidone (RISPERDAL) 4 MG tablet Take 4 mg by mouth 2 (two) times daily.    senna-docusate (SENOKOT-S) 8.6-50 MG tablet Take 1 tablet by mouth daily.    No facility-administered encounter medications on file as of 07/07/2021.   07-07-2021: 1st attempt left VM 07-15-2021: 2nd attempt left VM 07-17-2021: 3rd attempt left VM  Care Gaps: Colonoscopy overdue Flu vaccine overdue  Covid booster overdue AWV 07-10-2021  Star Rating Drugs: None  Jeannette How [facility] Clinical Pharmacist Assistant [phone]

## 2021-07-10 ENCOUNTER — Ambulatory Visit (INDEPENDENT_AMBULATORY_CARE_PROVIDER_SITE_OTHER): Payer: Medicare PPO

## 2021-07-10 VITALS — Ht 75.5 in | Wt 240.0 lb

## 2021-07-10 DIAGNOSIS — Z0000 Encounter for general adult medical examination without abnormal findings: Secondary | ICD-10-CM | POA: Diagnosis not present

## 2021-07-10 NOTE — Progress Notes (Addendum)
I connected with  Scott Farrell today via telehealth video enabled device and verified that I am speaking with the correct person using two identifiers.   Location: Patient: home Provider: work  Persons participating in virtual visit: Glenna Durand LPN, Candace Gallus  I discussed the limitations, risks, security and privacy concerns of performing an evaluation and management service by video and the availability of in person appointments. The patient expressed understanding and agreed to proceed.   Some vital signs may be absent or patient reported.      Subjective:   Scott Farrell is a 41 y.o. male who presents for Medicare Annual/Subsequent preventive examination.  Review of Systems     Cardiac Risk Factors include: male gender;smoking/ tobacco exposure     Objective:    Today's Vitals   07/10/21 1522  Weight: 240 lb (108.9 kg)  Height: 6' 3.5" (1.918 m)   Body mass index is 29.6 kg/m.  Advanced Directives 07/10/2021 08/12/2020 07/03/2020 11/23/2016 06/06/2015 04/10/2015 01/03/2015  Does Patient Have a Medical Advance Directive? No No No No No No No  Would patient like information on creating a medical advance directive? - - - No - Patient declined No - patient declined information - -    Current Medications (verified) Outpatient Encounter Medications as of 07/10/2021  Medication Sig   divalproex (DEPAKOTE) 500 MG DR tablet Take 500 mg by mouth at bedtime.   HYDROcodone-acetaminophen (NORCO/VICODIN) 5-325 MG tablet Take 1 tablet by mouth every 6 (six) hours as needed for moderate pain.   risperidone (RISPERDAL) 4 MG tablet Take 4 mg by mouth 2 (two) times daily.   senna-docusate (SENOKOT-S) 8.6-50 MG tablet Take 1 tablet by mouth daily.   alum & mag hydroxide-simeth (MAALOX PLUS) 400-400-40 MG/5ML suspension Take 15 mLs by mouth every 6 (six) hours as needed for indigestion. (Patient not taking: Reported on 07/10/2021)   benzonatate (TESSALON) 100 MG capsule Take 1  capsule (100 mg total) by mouth every 8 (eight) hours. (Patient not taking: Reported on 07/10/2021)   ciprofloxacin (CIPRO) 500 MG tablet Take 1 tablet (500 mg total) by mouth 2 (two) times daily. One po bid x 7 days (Patient not taking: Reported on 07/10/2021)   metroNIDAZOLE (FLAGYL) 500 MG tablet Take 1 tablet (500 mg total) by mouth 4 (four) times daily. One po bid x 7 days (Patient not taking: Reported on 07/10/2021)   ondansetron (ZOFRAN-ODT) 4 MG disintegrating tablet 4mg  ODT q4 hours prn nausea/vomit (Patient not taking: Reported on 07/10/2021)   No facility-administered encounter medications on file as of 07/10/2021.    Allergies (verified) Metformin and related   History: Past Medical History:  Diagnosis Date   Clotting disorder ([place])    personal hx/o? etiology unclear   Costochondritis    history of   Herpes genitalia 1/12   History of echocardiogram 01/07/11   normal LV function, EF 60%, trace mitral, tricuspid and pulmonic regurgitation; Dr. Tollie Eth   IBS (irritable bowel syndrome)    Mental disorder 2013   hospitalized for psychiatric illness prior   Normal cardiac stress test 01/07/11   negative treadmill test, no evidence of ischemia, good exercise capacity; Tollie Eth, MD;    Prostatitis 11/2010   Dr. Karsten Ro   Renal stone 06/2012   Urology consult   Schizophrenia [facility])    [facility], [facility] - Dr. Lennon Alstrom   Tobacco use disorder    Past Surgical History:  Procedure Laterality Date   EUS  06/30/2012  Procedure: UPPER ENDOSCOPIC ULTRASOUND (EUS) LINEAR;  Surgeon: Milus Banister, MD;  Location: [facility];  Service: Endoscopy;  Laterality: N/A;  radial linear   WISDOM TOOTH EXTRACTION     Family History  Problem Relation Age of Onset   Hypertension Mother    Stroke Mother    Depression Mother    Acne Mother    Thyroid disease Mother    Diabetes Mother    Colon cancer Mother 13   Acute lymphoblastic leukemia Father    Diabetes Father     Cancer Brother    Breast cancer Maternal Aunt    Ovarian cancer Maternal Aunt    Social History   Socioeconomic History   Marital status: Single    Spouse name: Not on file   Number of children: Not on file   Years of education: Not on file   Highest education level: Not on file  Occupational History   Occupation: [employer]  Tobacco Use   Smoking status: Some Days    Packs/day: 0.25    Years: 23.00    Pack years: 5.75    Types: Cigars, Cigarettes    Passive exposure: Never   Smokeless tobacco: Never   Tobacco comments:    07/10/2021, smokes 4 [place]yenne cigars daily  Vaping Use   Vaping Use: Some days   Substances: Nicotine  Substance and Sexual Activity   Alcohol use: Yes    Comment: occ   Drug use: Yes    Types: Hydrocodone   Sexual activity: Not Currently    Partners: Female  Other Topics Concern   Not on file  Social History Narrative   Lives with mother, [name], exercising with walking, running, no current relationship; unemployed   Caffeine use: 1 cup per day   Right handed   Social Determinants of Health   Financial Resource Strain: Low Risk    Difficulty of Paying Living Expenses: Not hard at all  Food Insecurity: No Food Insecurity   Worried About Charity fundraiser in the Last Year: Never true   [place] in the Last Year: Never true  Transportation Needs: No Transportation Needs   Lack of Transportation (Medical): No   Lack of Transportation (Non-Medical): No  Physical Activity: Insufficiently Active   Days of Exercise per Week: 4 days   Minutes of Exercise per Session: 20 min  Stress: No Stress Concern Present   Feeling of Stress : Not at all  Social Connections: Not on file    Tobacco Counseling Ready to quit: Yes Counseling given: Not Answered Tobacco comments: 07/10/2021, smokes 4 [place] cigars daily   Clinical Intake:  Pre-visit preparation completed: Yes  Pain : No/denies pain     Nutritional Status: BMI 25 -29  Overweight Nutritional Risks: None Diabetes: No  How often do you need to have someone help you when you read instructions, pamphlets, or other written materials from your doctor or pharmacy?: 1 - Never What is the last grade level you completed in school?: college  Diabetic? no  Interpreter Needed?: No  Information entered by :: NAllen LPN   Activities of Daily Living In your present state of health, do you have any difficulty performing the following activities: 07/10/2021  Hearing? N  Vision? Y  Comment blurry sometimes  Difficulty concentrating or making decisions? Y  Comment trouble making decisions  Walking or climbing stairs? N  Dressing or bathing? N  Doing errands, shopping? N  Preparing Food and eating ? N  Using  the Toilet? N  In the past six months, have you accidently leaked urine? N  Do you have problems with loss of bowel control? N  Managing your Medications? N  Managing your Finances? N  Housekeeping or managing your Housekeeping? N  Some recent data might be hidden    Patient Care Team: [name], FNP as PCP - General (General Practice) Mayford Knife, [facility] (Pharmacist)  Indicate any recent Medical Services you may have received from other than [facility] providers in the past year (date may be approximate).     Assessment:   This is a routine wellness examination for Scott Farrell.  Hearing/Vision screen Vision Screening - Comments:: No regular eye exams  Dietary issues and exercise activities discussed: Current Exercise Habits: Home exercise routine, Type of exercise: walking, Time (Minutes): 20, Frequency (Times/Week): 4, Weekly Exercise (Minutes/Week): 80   Goals Addressed             This Visit's Progress    Patient Stated       07/10/2021, wants to lose 40 pounds       Depression Screen PHQ 2/9 Scores 07/10/2021 09/03/2020 07/03/2020 09/27/2019  PHQ - 2 Score 0 0 2 3  PHQ- 9 Score - - 6 8    Fall Risk Fall Risk  07/10/2021 07/03/2020  Falls in  the past year? 0 0  Risk for fall due to : Medication side effect Medication side effect  Follow up Falls evaluation completed;Education provided;Falls prevention discussed Falls evaluation completed;Education provided;Falls prevention discussed    FALL RISK PREVENTION PERTAINING TO THE HOME:  Any stairs in or around the home? Yes  If so, are there any without handrails? No  Home free of loose throw rugs in walkways, pet beds, electrical cords, etc? Yes  Adequate lighting in your home to reduce risk of falls? Yes   ASSISTIVE DEVICES UTILIZED TO PREVENT FALLS:  Life alert? No  Use of a cane, walker or w/c? No  Grab bars in the bathroom? No  Shower chair or bench in shower? No  Elevated toilet seat or a handicapped toilet? No   TIMED UP AND GO:  Was the test performed? No .      Cognitive Function:     6CIT Screen 07/10/2021 07/03/2020  What Year? 0 points 0 points  What month? 0 points 0 points  What time? 0 points 0 points  Count back from 20 0 points 0 points  Months in reverse 4 points 0 points  Repeat phrase 0 points 0 points  Total Score 4 0    Immunizations Immunization History  Administered Date(s) Administered   Influenza Inj Mdck Quad Pf 06/02/2018   Influenza-Unspecified 03/02/2020   Moderna Sars-Covid-2 Vaccination 09/18/2019, 10/19/2019, 05/18/2020   Pfizer Covid-19 Vaccine Bivalent Booster 16yrs & up 05/28/2021    TDAP status: Due, Education has been provided regarding the importance of this vaccine. Advised may receive this vaccine at local pharmacy or Health Dept. Aware to provide a copy of the vaccination record if obtained from local pharmacy or Health Dept. Verbalized acceptance and understanding.  Flu Vaccine status: Up to date  Pneumococcal vaccine status: Up to date  Covid-19 vaccine status: Completed vaccines  Qualifies for Shingles Vaccine? No   Zostavax completed No   Shingrix Completed?: n/a  Screening Tests Health Maintenance  Topic  Date Due   TETANUS/TDAP  Never done   COLONOSCOPY (Pts 45-31yrs Insurance coverage will need to be confirmed)  08/09/2017   INFLUENZA VACCINE  12/30/2020  COVID-19 Vaccine  Completed   Hepatitis C Screening  Completed   HIV Screening  Completed   HPV VACCINES  Aged Out    Health Maintenance  Health Maintenance Due  Topic Date Due   TETANUS/TDAP  Never done   COLONOSCOPY (Pts 45-60yrs Insurance coverage will need to be confirmed)  08/09/2017   INFLUENZA VACCINE  12/30/2020    Colorectal cancer screening: Type of screening: Colonoscopy. Completed 08/09/2012. Repeat every 5 years  Lung Cancer Screening: (Low Dose CT Chest recommended if Age 37-80 years, 30 pack-year currently smoking OR have quit w/in 15years.) does not qualify.   Lung Cancer Screening Referral: no  Additional Screening:  Hepatitis C Screening: does qualify; Completed 05/27/2020  Vision Screening: Recommended annual ophthalmology exams for early detection of glaucoma and other disorders of the eye. Is the patient up to date with their annual eye exam?  No  Who is the provider or what is the name of the office in which the patient attends annual eye exams? none If pt is not established with a provider, would they like to be referred to a provider to establish care? No .   Dental Screening: Recommended annual dental exams for proper oral hygiene  Community Resource Referral / Chronic Care Management: CRR required this visit?  No   CCM required this visit?  No      Plan:     I have personally reviewed and noted the following in the patients chart:   Medical and social history Use of alcohol, tobacco or illicit drugs  Current medications and supplements including opioid prescriptions. Patient is currently taking opioid prescriptions. Information provided to patient regarding non-opioid alternatives. Patient advised to discuss non-opioid treatment plan with their provider. Functional ability and  status Nutritional status Physical activity Advanced directives List of other physicians Hospitalizations, surgeries, and ER visits in previous 12 months Vitals Screenings to include cognitive, depression, and falls Referrals and appointments  In addition, I have reviewed and discussed with patient certain preventive protocols, quality metrics, and best practice recommendations. A written personalized care plan for preventive services as well as general preventive health recommendations were provided to patient.     Kellie Simmering, LPN   08/31/7406   Nurse Notes: none  Due to this being a virtual visit, the after visit summary with patients personalized plan was offered to patient via mail or my-chart. Patient would like to access on my-chart

## 2021-07-10 NOTE — Patient Instructions (Signed)
[name] , Thank you for taking time to come for your Medicare Wellness Visit. I appreciate your ongoing commitment to your health goals. Please review the following plan we discussed and let me know if I can assist you in the future.   Screening recommendations/referrals: Colonoscopy: completed 08/09/2012, due now Recommended yearly ophthalmology/optometry visit for glaucoma screening and checkup Recommended yearly dental visit for hygiene and checkup  Vaccinations: Influenza vaccine: completed per patient Pneumococcal vaccine: n/a Tdap vaccine: due now Shingles vaccine: n/a   Covid-19:  05/28/2021, 05/18/2020, 10/19/2019, 09/18/2019  Advanced directives: Advance directive discussed with you today.   Conditions/risks identified: smoking  Next appointment: Follow up in one year for your annual wellness visit   Preventive Care 40-64 Years, Male Preventive care refers to lifestyle choices and visits with your health care provider that can promote health and wellness. What does preventive care include? A yearly physical exam. This is also called an annual well check. Dental exams once or twice a year. Routine eye exams. Ask your health care provider how often you should have your eyes checked. Personal lifestyle choices, including: Daily care of your teeth and gums. Regular physical activity. Eating a healthy diet. Avoiding tobacco and drug use. Limiting alcohol use. Practicing safe sex. Taking low-dose aspirin every day starting at age 40. What happens during an annual well check? The services and screenings done by your health care provider during your annual well check will depend on your age, overall health, lifestyle risk factors, and family history of disease. Counseling  Your health care provider may ask you questions about your: Alcohol use. Tobacco use. Drug use. Emotional well-being. Home and relationship well-being. Sexual activity. Eating habits. Work and work  Statistician. Screening  You may have the following tests or measurements: Height, weight, and BMI. Blood pressure. Lipid and cholesterol levels. These may be checked every 5 years, or more frequently if you are over 66 years old. Skin check. Lung cancer screening. You may have this screening every year starting at age 20 if you have a 30-pack-year history of smoking and currently smoke or have quit within the past 15 years. Fecal occult blood test (FOBT) of the stool. You may have this test every year starting at age 52. Flexible sigmoidoscopy or colonoscopy. You may have a sigmoidoscopy every 5 years or a colonoscopy every 10 years starting at age 46. Prostate cancer screening. Recommendations will vary depending on your family history and other risks. Hepatitis C blood test. Hepatitis B blood test. Sexually transmitted disease (STD) testing. Diabetes screening. This is done by checking your blood sugar (glucose) after you have not eaten for a while (fasting). You may have this done every 1-3 years. Discuss your test results, treatment options, and if necessary, the need for more tests with your health care provider. Vaccines  Your health care provider may recommend certain vaccines, such as: Influenza vaccine. This is recommended every year. Tetanus, diphtheria, and acellular pertussis (Tdap, Td) vaccine. You may need a Td booster every 10 years. Zoster vaccine. You may need this after age 37. Pneumococcal 13-valent conjugate (PCV13) vaccine. You may need this if you have certain conditions and have not been vaccinated. Pneumococcal polysaccharide (PPSV23) vaccine. You may need one or two doses if you smoke cigarettes or if you have certain conditions. Talk to your health care provider about which screenings and vaccines you need and how often you need them. This information is not intended to replace advice given to you by your health  care provider. Make sure you discuss any questions you  have with your health care provider. Document Released: 06/14/2015 Document Revised: 02/05/2016 Document Reviewed: 03/19/2015 Elsevier Interactive Patient Education  2017 [place] Prevention in the Home Falls can cause injuries. They can happen to people of all ages. There are many things you can do to make your home safe and to help prevent falls. What can I do on the outside of my home? Regularly fix the edges of walkways and driveways and fix any cracks. Remove anything that might make you trip as you walk through a door, such as a raised step or threshold. Trim any bushes or trees on the path to your home. Use bright outdoor lighting. Clear any walking paths of anything that might make someone trip, such as rocks or tools. Regularly check to see if handrails are loose or broken. Make sure that both sides of any steps have handrails. Any raised decks and porches should have guardrails on the edges. Have any leaves, snow, or ice cleared regularly. Use sand or salt on walking paths during winter. Clean up any spills in your garage right away. This includes oil or grease spills. What can I do in the bathroom? Use night lights. Install grab bars by the toilet and in the tub and shower. Do not use towel bars as grab bars. Use non-skid mats or decals in the tub or shower. If you need to sit down in the shower, use a plastic, non-slip stool. Keep the floor dry. Clean up any water that spills on the floor as soon as it happens. Remove soap buildup in the tub or shower regularly. Attach bath mats securely with double-sided non-slip rug tape. Do not have throw rugs and other things on the floor that can make you trip. What can I do in the bedroom? Use night lights. Make sure that you have a light by your bed that is easy to reach. Do not use any sheets or blankets that are too big for your bed. They should not hang down onto the floor. Have a firm chair that has side arms. You can  use this for support while you get dressed. Do not have throw rugs and other things on the floor that can make you trip. What can I do in the kitchen? Clean up any spills right away. Avoid walking on wet floors. Keep items that you use a lot in easy-to-reach places. If you need to reach something above you, use a strong step stool that has a grab bar. Keep electrical cords out of the way. Do not use floor polish or wax that makes floors slippery. If you must use wax, use non-skid floor wax. Do not have throw rugs and other things on the floor that can make you trip. What can I do with my stairs? Do not leave any items on the stairs. Make sure that there are handrails on both sides of the stairs and use them. Fix handrails that are broken or loose. Make sure that handrails are as long as the stairways. Check any carpeting to make sure that it is firmly attached to the stairs. Fix any carpet that is loose or worn. Avoid having throw rugs at the top or bottom of the stairs. If you do have throw rugs, attach them to the floor with carpet tape. Make sure that you have a light switch at the top of the stairs and the bottom of the stairs. If you do not  have them, ask someone to add them for you. What else can I do to help prevent falls? Wear shoes that: Do not have high heels. Have rubber bottoms. Are comfortable and fit you well. Are closed at the toe. Do not wear sandals. If you use a stepladder: Make sure that it is fully opened. Do not climb a closed stepladder. Make sure that both sides of the stepladder are locked into place. Ask someone to hold it for you, if possible. Clearly mark and make sure that you can see: Any grab bars or handrails. First and last steps. Where the edge of each step is. Use tools that help you move around (mobility aids) if they are needed. These include: Canes. Walkers. Scooters. Crutches. Turn on the lights when you go into a dark area. Replace any light  bulbs as soon as they burn out. Set up your furniture so you have a clear path. Avoid moving your furniture around. If any of your floors are uneven, fix them. If there are any pets around you, be aware of where they are. Review your medicines with your doctor. Some medicines can make you feel dizzy. This can increase your chance of falling. Ask your doctor what other things that you can do to help prevent falls. This information is not intended to replace advice given to you by your health care provider. Make sure you discuss any questions you have with your health care provider. Document Released: 03/14/2009 Document Revised: 10/24/2015 Document Reviewed: 06/22/2014 Elsevier Interactive Patient Education  2017 [employer].

## 2021-07-18 ENCOUNTER — Ambulatory Visit (INDEPENDENT_AMBULATORY_CARE_PROVIDER_SITE_OTHER): Payer: Medicare PPO

## 2021-07-18 ENCOUNTER — Telehealth: Payer: Self-pay

## 2021-07-18 DIAGNOSIS — E781 Pure hyperglyceridemia: Secondary | ICD-10-CM

## 2021-07-18 DIAGNOSIS — R7303 Prediabetes: Secondary | ICD-10-CM

## 2021-07-18 DIAGNOSIS — Z720 Tobacco use: Secondary | ICD-10-CM

## 2021-07-18 NOTE — Patient Instructions (Signed)
Visit Information  Thank you for taking time to visit with me today. Please don't hesitate to contact me if I can be of assistance to you before our next scheduled telephone appointment.  Following are the goals we discussed today:  (Copy and paste patient goals from clinical care plan here)  Our next appointment is by telephone on 08/13/21 at 9:30 AM   Please call the care guide team at [phone] if you need to cancel or reschedule your appointment.   If you are experiencing a Mental Health or [facility] or need someone to talk to, please call 1-800-273-TALK (toll free, 24 hour hotline)   Patient verbalizes understanding of instructions and care plan provided today and agrees to view in [place]. Active MyChart status confirmed with patient.    Barb Merino, RN, BSN, CCM Care Management Coordinator [place] [facility]  Direct Phone: [phone]

## 2021-07-18 NOTE — Chronic Care Management (AMB) (Signed)
Chronic Care Management   CCM RN Visit Note  07/18/2021 Name: Scott Farrell MRN: [id number] DOB: 10-14-1980  Subjective: Scott Farrell is a 41 y.o. year old male who is a primary care patient of [name], [place]. The care management team was consulted for assistance with disease management and care coordination needs.    Engaged with patient by telephone for follow up visit in response to provider referral for case management and/or care coordination services.   Consent to Services:  The patient was given information about Chronic Care Management services, agreed to services, and gave verbal consent prior to initiation of services.  Please see initial visit note for detailed documentation.   Patient agreed to services and verbal consent obtained.   Assessment: Review of patient past medical history, allergies, medications, health status, including review of consultants reports, laboratory and other test data, was performed as part of comprehensive evaluation and provision of chronic care management services.   SDOH (Social Determinants of Health) assessments and interventions performed:    CCM Care Plan  Allergies  Allergen Reactions   Metformin And Related Other (See Comments)    Chest tightness    Outpatient Encounter Medications as of 07/18/2021  Medication Sig Note   alum & mag hydroxide-simeth (MAALOX PLUS) 400-400-40 MG/5ML suspension Take 15 mLs by mouth every 6 (six) hours as needed for indigestion. (Patient not taking: Reported on 07/10/2021)    benzonatate (TESSALON) 100 MG capsule Take 1 capsule (100 mg total) by mouth every 8 (eight) hours. (Patient not taking: Reported on 07/10/2021)    ciprofloxacin (CIPRO) 500 MG tablet Take 1 tablet (500 mg total) by mouth 2 (two) times daily. One po bid x 7 days (Patient not taking: Reported on 07/10/2021) 05/22/2021: ABT Start Date 05/17/21.   divalproex (DEPAKOTE) 500 MG DR tablet Take 500 mg by mouth at bedtime.     HYDROcodone-acetaminophen (NORCO/VICODIN) 5-325 MG tablet Take 1 tablet by mouth every 6 (six) hours as needed for moderate pain.    metroNIDAZOLE (FLAGYL) 500 MG tablet Take 1 tablet (500 mg total) by mouth 4 (four) times daily. One po bid x 7 days (Patient not taking: Reported on 07/10/2021) 05/22/2021: ABT Start Date 05/17/21.    ondansetron (ZOFRAN-ODT) 4 MG disintegrating tablet 4mg  ODT q4 hours prn nausea/vomit (Patient not taking: Reported on 07/10/2021)    risperidone (RISPERDAL) 4 MG tablet Take 4 mg by mouth 2 (two) times daily.    senna-docusate (SENOKOT-S) 8.6-50 MG tablet Take 1 tablet by mouth daily.    No facility-administered encounter medications on file as of 07/18/2021.    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   Lightheaded 07/31/2019   Callus of foot 01/27/2018   Unspecified constipation 04/27/2012   Family history of malignant neoplasm of gastrointestinal tract 04/27/2012   Gastric mass 04/21/2012    Conditions to be addressed/monitored: Prediabetes, High Triglycerides, Tobacco Use  Care Plan : [place] of Care  Updates made by Lynne Logan, RN since 07/18/2021 12:00 AM     Problem: No plan of care established for management of chronic disease states (Prediabetes, High Triglycerides, Tobacco Use)   Priority: High     Long-Range Goal: Establishment of plan of care for management of chronic disease states (Prediabetes, High Triglycerides, Tobacco  Use)   Start Date: 07/18/2021  Expected End Date: 07/17/2022  This Visit's Progress: On track  Priority: High  Note:   Current Barriers:  Knowledge Deficits related to plan of care for management of Prediabetes, High Triglycerides, Tobacco Use  Chronic Disease Management support and education needs  related to Prediabetes, High Triglycerides, Tobacco Use   RNCM Clinical Goal(s):  Patient will verbalize basic understanding of  Prediabetes, High Triglycerides, Tobacco Use disease process and self health management plan as evidenced by patient  take all medications exactly as prescribed and will call provider for medication related questions as evidenced by demonstrate improved understanding of prescribed medications and rationale for usage as evidenced by patient teach back demonstrate Improved health management independence as evidenced by patient will report 100% adherence to his prescribed treatment plan  continue to work with RN Care Manager to address care management and care coordination needs related to  Prediabetes, High Triglycerides, Tobacco Use as evidenced by adherence to CM Team Scheduled appointments demonstrate ongoing self health care management ability   as evidenced by    through collaboration with RN Care manager, provider, and care team.   Interventions: 1:1 collaboration with primary care provider regarding development and update of comprehensive plan of care as evidenced by provider attestation and co-signature Inter-disciplinary care team collaboration (see longitudinal plan of care) Evaluation of current treatment plan related to  self management and patient's adherence to plan as established by provider  Prediabetes Interventions:  (Status:  Condition stable.  Not addressed this visit.) Long Term Goal Assessed patient's understanding of A1c goal:  <5.7% Provided education to patient about basic DM disease process Review of patient status, including review of consultant's reports, relevant laboratory and other test results, and medications completed. Reviewed medications with patient and discussed importance of medication adherence Educated patient on dietary and exercise recommendations  Mailed patient printed educational materials related to Prediabetes, Low Carb  Smoothies, Carb Choice list  Discussed plans with patient for ongoing care management follow up and provided patient with direct contact information for care management team Lab Results  Component Value Date   HGBA1C 5.7 (H) 09/03/2020   Smoking Cessation Interventions:  (Status:  Condition stable.  Not addressed this visit.)  Long Term Goal Evaluation of current treatment plan related to Tobacco Use, self-management and patient's adherence to plan as established by provider Discussed patient smokes about 1 pack of cigarettes over 1-2 weeks due to cost Determined patient has tried Nicotine patches in the past with minimal effectiveness, he would like to try something else  Determined patient spoke with the embedded Pharm D, she did not make any recommendations Sent in basket message to PCP regarding additional recommendations to help patient with smoking cessation  Discussed plans with patient for ongoing care management follow up and provided patient with direct contact information for care management team Discussed plans with patient for ongoing care management follow up and provided patient with direct contact information for care management team  Hyperlipidemia Interventions:  (Status:  Condition stable.  Not addressed this visit.) Long Term Goal Medication review performed; medication list updated in electronic medical record Provided education to patient about basic disease process related to Elevated Triglycerides Review of patient status, including review of consultant's reports, relevant laboratory and other test results, and medications completed. Reviewed medications with patient and discussed importance of medication adherence Educated on dietary and exercise recommendations Mailed printed educational materials related to Triglycerides; Cooking to lower Cholesterol  Discussed plans with patient  for ongoing care management follow up and provided patient with direct contact  information for care management team  Diverticulitis Interventions:  (Status:  New goal.)  Long Term Goal Evaluation of current treatment plan related to  Diverticulitis , self-management and patient's adherence to plan as established by provider Determined patient experienced multiple ED visits in December for symptoms of abdominal pain and headaches Review of patient status, including review of consultant's reports, relevant laboratory and other test results, and medications completed Educated patient on basic disease process related to Diverticulitis  Educated on risk factors, diagnosis, treatment options, dietary recommendations and ways to avoid chronic diverticulosis Determined patient continues to have abdominal symptoms including, pain, fullness and chronic constipation Discussed patient's concerns that he family history of colon cancer Determined patient has not followed up with GI in sometime, he believes he will need a colonoscopy due to having polyps removed in the past and he feels he is overdue with Colonoscopy Discussed need for PCP follow up to further evaluate GI symptoms and discuss new GI referral with PCP Reviewed scheduled/upcoming provider appointment including: PCP follow up appointment scheduled for 08/11/21 @ 4 PM Discussed plans with patient for ongoing care management follow up and provided patient with direct contact information for care management team    Patient Goals/Self-Care Activities: Take all medications as prescribed Attend all scheduled provider appointments Call pharmacy for medication refills 3-7 days in advance of running out of medications Perform all self care activities independently  Call provider office for new concerns or questions   Follow Up Plan:  Telephone follow up appointment with care management team member scheduled for:  08/13/21       Barb Merino, RN, BSN, CCM Care Management Coordinator [place] [facility]  Direct Phone: [phone]

## 2021-07-21 ENCOUNTER — Telehealth: Payer: Self-pay

## 2021-07-21 NOTE — Telephone Encounter (Signed)
° °  Telephone encounter was:  Successful.  07/21/2021 Name: [name] MRN: [id number] DOB: 07-Mar-1981  Scott Farrell is a 41 y.o. year old male who is a primary care patient of [name], [place] . The community resource team was consulted for assistance with Transportation Needs  and [place] guide performed the following interventions:  Pt needs transportation information and food pantry list for [facility]. Information sent out by mail.  Follow Up Plan:  Care guide will follow up with patient by phone over the next few days regarding mail.  [place] management  [place], [place]  Main Phone: [phone]   E-mail: [email or web address]  Website: [email or web address]

## 2021-07-29 DIAGNOSIS — Z720 Tobacco use: Secondary | ICD-10-CM | POA: Diagnosis not present

## 2021-07-29 DIAGNOSIS — R7303 Prediabetes: Secondary | ICD-10-CM

## 2021-07-29 DIAGNOSIS — E781 Pure hyperglyceridemia: Secondary | ICD-10-CM

## 2021-08-05 ENCOUNTER — Telehealth: Payer: Self-pay

## 2021-08-05 NOTE — Telephone Encounter (Signed)
? ?  Telephone encounter was:  Successful.  ?08/05/2021 ?Name: Scott Farrell MRN: [id number] DOB: [birth year] ? ?Scott Farrell is a 41 y.o. year old male who is a primary care patient of [name], [place] . The community resource team was consulted for assistance with Transportation Needs  and Food Insecurity ? ?Care guide performed the following interventions:  Pt advised he received mail. He went to a food pantry today and turned in the transportation application already. At this time, pt does not have any further questions or concerns.  ? ?Follow Up Plan:  No further follow up planned at this time. The patient has been provided with needed resources. ? ?Cameron Proud ?Care Guide, Embedded Care Coordination ?[facility]  Care management  ?[place], [place]  ?Main Phone: [phone]  E-mail: [email or web address]  ?Website: [email or web address] ? ? ? ?

## 2021-08-11 ENCOUNTER — Ambulatory Visit (INDEPENDENT_AMBULATORY_CARE_PROVIDER_SITE_OTHER): Payer: Medicare PPO | Admitting: Nurse Practitioner

## 2021-08-11 ENCOUNTER — Other Ambulatory Visit: Payer: Self-pay

## 2021-08-11 ENCOUNTER — Encounter: Payer: Self-pay | Admitting: Nurse Practitioner

## 2021-08-11 VITALS — BP 118/70 | HR 72 | Temp 98.2°F | Ht 75.5 in | Wt 247.0 lb

## 2021-08-11 DIAGNOSIS — K5792 Diverticulitis of intestine, part unspecified, without perforation or abscess without bleeding: Secondary | ICD-10-CM | POA: Diagnosis not present

## 2021-08-11 DIAGNOSIS — R5383 Other fatigue: Secondary | ICD-10-CM | POA: Diagnosis not present

## 2021-08-11 DIAGNOSIS — Z86010 Personal history of colonic polyps: Secondary | ICD-10-CM | POA: Diagnosis not present

## 2021-08-11 DIAGNOSIS — Z720 Tobacco use: Secondary | ICD-10-CM

## 2021-08-11 DIAGNOSIS — Z8719 Personal history of other diseases of the digestive system: Secondary | ICD-10-CM

## 2021-08-11 DIAGNOSIS — D649 Anemia, unspecified: Secondary | ICD-10-CM | POA: Diagnosis not present

## 2021-08-11 MED ORDER — NICOTINE 21 MG/24HR TD PT24: 21.0000 mg | MEDICATED_PATCH | TRANSDERMAL | 1 refills | Status: DC

## 2021-08-11 NOTE — Patient Instructions (Signed)
Diverticulitis ?Diverticulitis is when small pouches in your colon (large intestine) get infected or swollen. This causes pain in the belly (abdomen) and watery poop (diarrhea). ?These pouches are called diverticula. The pouches form in people who have a condition called diverticulosis. ?What are the causes? ?This condition may be caused by poop (stool) that gets trapped in the pouches in your colon. The poop lets germs (bacteria) grow in the pouches. This causes the infection. ?What increases the risk? ?You are more likely to get this condition if you have small pouches in your colon. The risk is higher if: ?You are overweight or very overweight (obese). ?You do not exercise enough. ?You drink alcohol. ?You smoke or use products with tobacco in them. ?You eat a diet that has a lot of red meat such as beef, pork, or lamb. ?You eat a diet that does not have enough fiber in it. ?You are older than 41 years of age. ?What are the signs or symptoms? ?Pain in the belly. Pain is often on the left side, but it may be in other areas. ?Fever and feeling cold. ?Feeling like you may vomit. ?Vomiting. ?Having cramps. ?Feeling full. ?Changes to how often you poop. ?Blood in your poop. ?How is this treated? ?Most cases are treated at home by: ?Taking over-the-counter pain medicines. ?Following a clear liquid diet. ?Taking antibiotic medicines. ?Resting. ?Very bad cases may need to be treated at a hospital. This may include: ?Not eating or drinking. ?Taking prescription pain medicine. ?Getting antibiotic medicines through an IV tube. ?Getting fluid and food through an IV tube. ?Having surgery. ?When you are feeling better, your doctor may tell you to have a test to check your colon (colonoscopy). ?Follow these instructions at home: ?Medicines ?Take over-the-counter and prescription medicines only as told by your doctor. These include: ?Antibiotics. ?Pain medicines. ?Fiber pills. ?Probiotics. ?Stool softeners. ?If you were  prescribed an antibiotic medicine, take it as told by your doctor. Do not stop taking the antibiotic even if you start to feel better. ?Ask your doctor if the medicine prescribed to you requires you to avoid driving or using machinery. ?Eating and drinking ? ?Follow a diet as told by your doctor. ?When you feel better, your doctor may tell you to change your diet. You may need to eat a lot of fiber. Fiber makes it easier to poop (have a bowel movement). Foods with fiber include: ?Berries. ?Beans. ?Lentils. ?Green vegetables. ?Avoid eating red meat. ?General instructions ?Do not use any products that contain nicotine or tobacco, such as cigarettes, e-cigarettes, and chewing tobacco. If you need help quitting, ask your doctor. ?Exercise 3 or more times a week. Try to get 30 minutes each time. Exercise enough to sweat and make your heart beat faster. ?Keep all follow-up visits as told by your doctor. This is important. ?Contact a doctor if: ?Your pain does not get better. ?You are not pooping like normal. ?Get help right away if: ?Your pain gets worse. ?Your symptoms do not get better. ?Your symptoms get worse very fast. ?You have a fever. ?You vomit more than one time. ?You have poop that is: ?Bloody. ?Black. ?Tarry. ?Summary ?This condition happens when small pouches in your colon get infected or swollen. ?Take medicines only as told by your doctor. ?Follow a diet as told by your doctor. ?Keep all follow-up visits as told by your doctor. This is important. ?This information is not intended to replace advice given to you by your health care provider. Make sure you  discuss any questions you have with your health care provider. ?Document Revised: 02/27/2019 Document Reviewed: 02/27/2019 ?Elsevier Patient Education ? [place]. ? ?

## 2021-08-11 NOTE — Progress Notes (Signed)
?Industrial/product designer as a Education administrator for [employer], FNP.,have documented all relevant documentation on the behalf of [name], FNP,as directed by  [name], FNP while in the presence of [name], [place]. ? ?This visit occurred during the SARS-CoV-2 public health emergency.  Safety protocols were in place, including screening questions prior to the visit, additional usage of staff PPE, and extensive cleaning of exam room while observing appropriate contact time as indicated for disinfecting solutions. ? ?Subjective:  ?  ? Patient ID: Scott Farrell , male    DOB: 05/20/1981 , 41 y.o.   MRN: [id number] ? ? ?Chief Complaint  ?Patient presents with  ? Follow-up  ? ? ?HPI ? ?Patient is here for ED follow up when he went in December 2022. He was having stomach pain and was diagnosed with Diverticulitis.  ?  ? ?Past Medical History:  ?Diagnosis Date  ? Clotting disorder ([place])   ? personal hx/o? etiology unclear  ? Costochondritis   ? history of  ? Herpes genitalia 1/12  ? History of echocardiogram 01/07/11  ? normal LV function, EF 60%, trace mitral, tricuspid and pulmonic regurgitation; Dr. Tollie Eth  ? IBS (irritable bowel syndrome)   ? Mental disorder 2013  ? hospitalized for psychiatric illness prior  ? Normal cardiac stress test 01/07/11  ? negative treadmill test, no evidence of ischemia, good exercise capacity; Tollie Eth, MD;   ? Prostatitis 11/2010  ? Dr. Karsten Ro  ? Renal stone 06/2012  ? Urology consult  ? Schizophrenia ([place])   ? [facility], [place] - Dr. Lennon Alstrom  ? Tobacco use disorder   ?  ? ?Family History  ?Problem Relation Age of Onset  ? Hypertension Mother   ? Stroke Mother   ? Depression Mother   ? Acne Mother   ? Thyroid disease Mother   ? Diabetes Mother   ? Colon cancer Mother 56  ? Acute lymphoblastic leukemia Father   ? Diabetes Father   ? Cancer Brother   ? Breast cancer Maternal Aunt   ? Ovarian cancer Maternal Aunt   ? ? ? ?Current Outpatient Medications:  ?   nicotine (NICODERM CQ - DOSED IN MG/24 HOURS) 21 mg/24hr patch, Place 1 patch (21 mg total) onto the skin daily., Disp: 30 patch, Rfl: 1 ?  alum & mag hydroxide-simeth (MAALOX PLUS) 400-400-40 MG/5ML suspension, Take 15 mLs by mouth every 6 (six) hours as needed for indigestion. (Patient not taking: Reported on 07/10/2021), Disp: 355 mL, Rfl: 0 ?  benzonatate (TESSALON) 100 MG capsule, Take 1 capsule (100 mg total) by mouth every 8 (eight) hours. (Patient not taking: Reported on 07/10/2021), Disp: 21 capsule, Rfl: 0 ?  ciprofloxacin (CIPRO) 500 MG tablet, Take 1 tablet (500 mg total) by mouth 2 (two) times daily. One po bid x 7 days (Patient not taking: Reported on 07/10/2021), Disp: 20 tablet, Rfl: 0 ?  divalproex (DEPAKOTE) 500 MG DR tablet, Take 500 mg by mouth at bedtime., Disp: , Rfl:  ?  HYDROcodone-acetaminophen (NORCO/VICODIN) 5-325 MG tablet, Take 1 tablet by mouth every 6 (six) hours as needed for moderate pain., Disp: 20 tablet, Rfl: 0 ?  metroNIDAZOLE (FLAGYL) 500 MG tablet, Take 1 tablet (500 mg total) by mouth 4 (four) times daily. One po bid x 7 days (Patient not taking: Reported on 07/10/2021), Disp: 40 tablet, Rfl: 0 ?  ondansetron (ZOFRAN-ODT) 4 MG disintegrating tablet, '4mg'$  ODT q4 hours prn nausea/vomit (Patient not taking: Reported on 07/10/2021), Disp: 10 tablet,  Rfl: 0 ?  risperidone (RISPERDAL) 4 MG tablet, Take 4 mg by mouth 2 (two) times daily., Disp: , Rfl:  ?  senna-docusate (SENOKOT-S) 8.6-50 MG tablet, Take 1 tablet by mouth daily., Disp: 30 tablet, Rfl: 0  ? ?Allergies  ?Allergen Reactions  ? Metformin And Related Other (See Comments)  ?  Chest tightness  ?  ? ?Review of Systems  ?Constitutional:  Positive for fatigue.  ?Respiratory: Negative.    ?Cardiovascular: Negative.   ?Gastrointestinal: Negative.   ?Neurological: Negative.   ?Psychiatric/Behavioral: Negative.     ? ?Today's Vitals  ? 08/11/21 1513  ?BP: 118/70  ?Pulse: 72  ?Temp: 98.2 ?F (36.8 ?C)  ?TempSrc: Oral  ?Weight: 247 lb (112 kg)   ?Height: 6' 3.5" (1.918 m)  ? ?Body mass index is 30.47 kg/m?.  ? ?Objective:  ?Physical Exam ?Vitals reviewed.  ?Constitutional:   ?   General: He is not in acute distress. ?   Appearance: Normal appearance. He is obese.  ?HENT:  ?   Head: Normocephalic.  ?Cardiovascular:  ?   Rate and Rhythm: Normal rate and regular rhythm.  ?   Pulses: Normal pulses.  ?   Heart sounds: Normal heart sounds. No murmur heard. ?Pulmonary:  ?   Effort: Pulmonary effort is normal. No respiratory distress.  ?   Breath sounds: Normal breath sounds. No wheezing.  ?Skin: ?   General: Skin is warm and dry.  ?   Capillary Refill: Capillary refill takes less than 2 seconds.  ?Neurological:  ?   General: No focal deficit present.  ?   Mental Status: He is alert and oriented to person, place, and time.  ?   Cranial Nerves: No cranial nerve deficit.  ?   Motor: No weakness.  ?Psychiatric:     ?   Mood and Affect: Mood normal.     ?   Behavior: Behavior normal.     ?   Thought Content: Thought content normal.     ?   Judgment: Judgment normal.  ?  ? ?   ?Assessment And Plan:  ?   ?1. Diverticulitis ?Comments: Diagnosed in December and would like to see GI for further evaluation he does have a history of IBS. Also has a history of a gastric mass but unable to find rep ?- Ambulatory referral to Gastroenterology ? ?2. Other fatigue ?Comments: Will check for metabolic cause  ?- Iron, TIBC and Ferritin Panel ?- Vitamin B12 ?- TSH ? ?3. Low hemoglobin ?Comments: Hgb was down prior to discharge at hospital in December 2022, will recheck today ?- CBC with Differential/Platelet ? ?4. History of colon polyps ?- Ambulatory referral to Gastroenterology ? ?5. History of irritable bowel syndrome ?- Ambulatory referral to Gastroenterology ? ?6. Tobacco abuse ?Comments: Has tried to quit with nicotine patch over the counter, will send Rx for Nicotine patch to see if that is better ?- nicotine (NICODERM CQ - DOSED IN MG/24 HOURS) 21 mg/24hr patch; Place 1 patch  (21 mg total) onto the skin daily.  Dispense: 30 patch; Refill: 1 ?  ? ? ?Patient was given opportunity to ask questions. Patient verbalized understanding of the plan and was able to repeat key elements of the plan. All questions were answered to their satisfaction.  ?[name], FNP  ? ?I, [name], FNP, have reviewed all documentation for this visit. The documentation on 08/11/21 for the exam, diagnosis, procedures, and orders are all accurate and complete.  ? ?IF YOU HAVE BEEN  REFERRED TO A SPECIALIST, IT MAY TAKE 1-2 WEEKS TO SCHEDULE/PROCESS THE REFERRAL. IF YOU HAVE NOT HEARD FROM US/SPECIALIST IN TWO WEEKS, PLEASE GIVE [place] A CALL AT [phone].  ? ?THE PATIENT IS ENCOURAGED TO PRACTICE SOCIAL DISTANCING DUE TO THE COVID-19 PANDEMIC.   ?

## 2021-08-12 LAB — CBC WITH DIFFERENTIAL/PLATELET
Basophils Absolute: 0.1 10*3/uL (ref 0.0–0.2)
Basos: 1 %
EOS (ABSOLUTE): 0.1 10*3/uL (ref 0.0–0.4)
Eos: 2 %
Hematocrit: 42.7 % (ref 37.5–51.0)
Hemoglobin: 14.3 g/dL (ref 13.0–17.7)
Immature Grans (Abs): 0 10*3/uL (ref 0.0–0.1)
Immature Granulocytes: 0 %
Lymphocytes Absolute: 2.5 10*3/uL (ref 0.7–3.1)
Lymphs: 51 %
MCH: 28.5 pg (ref 26.6–33.0)
MCHC: 33.5 g/dL (ref 31.5–35.7)
MCV: 85 fL (ref 79–97)
Monocytes Absolute: 0.5 10*3/uL (ref 0.1–0.9)
Monocytes: 9 %
Neutrophils Absolute: 1.8 10*3/uL (ref 1.4–7.0)
Neutrophils: 37 %
Platelets: 186 10*3/uL (ref 150–450)
RBC: 5.02 x10E6/uL (ref 4.14–5.80)
RDW: 14 % (ref 11.6–15.4)
WBC: 4.9 10*3/uL (ref 3.4–10.8)

## 2021-08-12 LAB — IRON,TIBC AND FERRITIN PANEL
Ferritin: 226 ng/mL (ref 30–400)
Iron Saturation: 24 % (ref 15–55)
Iron: 84 ug/dL (ref 38–169)
Total Iron Binding Capacity: 354 ug/dL (ref 250–450)
UIBC: 270 ug/dL (ref 111–343)

## 2021-08-12 LAB — VITAMIN B12: Vitamin B-12: 599 pg/mL (ref 232–1245)

## 2021-08-12 LAB — TSH: TSH: 2.61 u[IU]/mL (ref 0.450–4.500)

## 2021-08-13 ENCOUNTER — Telehealth: Payer: Self-pay

## 2021-08-13 ENCOUNTER — Telehealth: Payer: Medicare PPO

## 2021-08-13 NOTE — Telephone Encounter (Signed)
?  Care Management  ? ?Follow Up Note ? ? ?08/13/2021 ?Name: MARCELUS DUBBERLY MRN: [id number] DOB: 1981-04-10 ? ? ?Referred by: [name], FNP ?Reason for referral : Chronic Care Management (RN CM Follow up call ) ? ? ?An unsuccessful telephone outreach was attempted today. The patient was referred to the case management team for assistance with care management and care coordination.  ? ?Follow Up Plan: A HIPPA compliant phone message was left for the patient providing contact information and requesting a return call.  ? ?Barb Merino, RN, BSN, CCM ?Care Management Coordinator ?[place] [facility]  ?Direct Phone: [phone] ? ? ?

## 2021-08-19 ENCOUNTER — Ambulatory Visit (INDEPENDENT_AMBULATORY_CARE_PROVIDER_SITE_OTHER): Payer: Medicare PPO

## 2021-08-19 ENCOUNTER — Telehealth: Payer: Medicare PPO

## 2021-08-19 DIAGNOSIS — E781 Pure hyperglyceridemia: Secondary | ICD-10-CM

## 2021-08-19 DIAGNOSIS — Z720 Tobacco use: Secondary | ICD-10-CM

## 2021-08-19 DIAGNOSIS — R7303 Prediabetes: Secondary | ICD-10-CM

## 2021-08-19 NOTE — Chronic Care Management (AMB) (Signed)
?Chronic Care Management  ? ?CCM RN Visit Note ? ?08/19/2021 ?Name: Scott Farrell MRN: [id number] DOB: 1981-01-20 ? ?Subjective: ?Scott Farrell is a 41 y.o. year old male who is a primary care patient of [name], [place]. The care management team was consulted for assistance with disease management and care coordination needs.   ? ?Engaged with patient by telephone for follow up visit in response to provider referral for case management and/or care coordination services.  ? ?Consent to Services:  ?The patient was given information about Chronic Care Management services, agreed to services, and gave verbal consent prior to initiation of services.  Please see initial visit note for detailed documentation.  ? ?Patient agreed to services and verbal consent obtained.  ? ?Assessment: Review of patient past medical history, allergies, medications, health status, including review of consultants reports, laboratory and other test data, was performed as part of comprehensive evaluation and provision of chronic care management services.  ? ?SDOH (Social Determinants of Health) assessments and interventions performed:  Yes, no acute changes ? ?CCM Care Plan ? ?Allergies  ?Allergen Reactions  ? Metformin And Related Other (See Comments)  ?  Chest tightness  ? ? ?Outpatient Encounter Medications as of 08/19/2021  ?Medication Sig Note  ? alum & mag hydroxide-simeth (MAALOX PLUS) 400-400-40 MG/5ML suspension Take 15 mLs by mouth every 6 (six) hours as needed for indigestion. (Patient not taking: Reported on 07/10/2021)   ? benzonatate (TESSALON) 100 MG capsule Take 1 capsule (100 mg total) by mouth every 8 (eight) hours. (Patient not taking: Reported on 07/10/2021)   ? ciprofloxacin (CIPRO) 500 MG tablet Take 1 tablet (500 mg total) by mouth 2 (two) times daily. One po bid x 7 days (Patient not taking: Reported on 07/10/2021) 05/22/2021: ABT Start Date 05/17/21.  ? divalproex (DEPAKOTE) 500 MG DR tablet Take 500 mg by mouth at  bedtime.   ? HYDROcodone-acetaminophen (NORCO/VICODIN) 5-325 MG tablet Take 1 tablet by mouth every 6 (six) hours as needed for moderate pain.   ? metroNIDAZOLE (FLAGYL) 500 MG tablet Take 1 tablet (500 mg total) by mouth 4 (four) times daily. One po bid x 7 days (Patient not taking: Reported on 07/10/2021) 05/22/2021: ABT Start Date 05/17/21.   ? nicotine (NICODERM CQ - DOSED IN MG/24 HOURS) 21 mg/24hr patch Place 1 patch (21 mg total) onto the skin daily.   ? ondansetron (ZOFRAN-ODT) 4 MG disintegrating tablet '4mg'$  ODT q4 hours prn nausea/vomit (Patient not taking: Reported on 07/10/2021)   ? risperidone (RISPERDAL) 4 MG tablet Take 4 mg by mouth 2 (two) times daily.   ? senna-docusate (SENOKOT-S) 8.6-50 MG tablet Take 1 tablet by mouth daily.   ? ?No facility-administered encounter medications on file as of 08/19/2021.  ? ? ?Patient Active Problem List  ? Diagnosis Date Noted  ? Chest pain of uncertain etiology 17/40/8144  ? Tobacco abuse 09/19/2020  ? Prediabetes 09/13/2020  ? High triglycerides 09/13/2020  ? Other headache syndrome 07/31/2019  ? 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 Lightheaded 07/31/2019  ? Callus of foot 01/27/2018  ? Unspecified constipation 04/27/2012  ? Family history of malignant neoplasm of gastrointestinal tract 04/27/2012  ? 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 ? ?Conditions to be addressed/monitored: Prediabetes, High Triglycerides, Tobacco Use ? ?Care Plan : RN Care Manager Plan of Care  ?Updates made by Lynne Logan, RN since 08/19/2021 12:00 AM  ?  ? ?Problem: No plan of care established for management of chronic disease states (Prediabetes, High Triglycerides,  Tobacco Use)   ?Priority: High  ?  ? ?Long-Range Goal: Establishment of plan of care for management of chronic disease states (Prediabetes, High Triglycerides, Tobacco Use)   ?Start Date: 07/18/2021  ?Expected End Date: 07/17/2022  ?Recent Progress: On track  ?Priority: High  ?Note:   ?Current Barriers:  ?Knowledge Deficits related to plan of  care for management of Prediabetes, High Triglycerides, Tobacco Use  ?Chronic Disease Management support and education needs related to Prediabetes, High Triglycerides, Tobacco Use  ? ?RNCM Clinical Goal(s):  ?Patient will verbalize basic understanding of  Prediabetes, High Triglycerides, Tobacco Use disease process and self health management plan as evidenced by patient  ?take all medications exactly as prescribed and will call provider for medication related questions as evidenced by demonstrate improved understanding of prescribed medications and rationale for usage as evidenced by patient teach back ?demonstrate Improved health management independence as evidenced by patient will report 100% adherence to his prescribed treatment plan  ?continue to work with RN Care Manager to address care management and care coordination needs related to  Prediabetes, High Triglycerides, Tobacco Use as evidenced by adherence to CM Team Scheduled appointments ?demonstrate ongoing self health care management ability   as evidenced by    through collaboration with RN Care manager, provider, and care team.  ? ?Interventions: ?1:1 collaboration with primary care provider regarding development and update of comprehensive plan of care as evidenced by provider attestation and co-signature ?Inter-disciplinary care team collaboration (see longitudinal plan of care) ?Evaluation of current treatment plan related to  self management and patient's adherence to plan as established by provider ? ?Diverticulitis Interventions:  (Status:  Goal on track:  Yes.)  Long Term Goal ?Evaluation of current treatment plan related to  Diverticulitis , self-management and patient's adherence to plan as established by provider ?Determined patient completed a follow up with PCP for evaluation of Diverticulitis and IBS ?Review of patient status, including review of consultant's reports, relevant laboratory and other test results, and medications  completed ?Determined patient completed a f/u with GI MD, Dr. Collene Mares, unfortunately he was advised by Dr. Collene Mares that he will not be excepted by this provider due to patient is established with [facility] ?Determined patient contacted this provider and was informed his insurance is not accepted, patient requesting assistance ?Placed outbound call to [place], confirmed patient's insurance is accepted and the referral sent by PCP on 07/10/21 will be reopened in order for patient to schedule a consultation ?Provided patient with the contact number for [facility] and instructed him to contact this provider to schedule an appointment for evaluation of Diverticulitis and IBS ?Discussed plans with patient for ongoing care management follow up and provided patient with direct contact information for care management team ?  ? ?Patient Goals/Self-Care Activities: ?Take all medications as prescribed ?Attend all scheduled provider appointments ?Call pharmacy for medication refills 3-7 days in advance of running out of medications ?Perform all self care activities independently  ?Call provider office for new concerns or questions  ?Call [facility] to schedule a follow up for evaluation of Diverticulitis and IBS ? ?Follow Up Plan:  Telephone follow up appointment with care management team member scheduled for:  09/09/21   ?  ? ?Barb Merino, RN, BSN, CCM ?Care Management Coordinator ?[place] [facility]  ?Direct Phone: [phone] ? ? ? ? ? ? ? ?

## 2021-08-19 NOTE — Patient Instructions (Signed)
Visit Information ? ?Thank you for taking time to visit with me today. Please don't hesitate to contact me if I can be of assistance to you before our next scheduled telephone appointment. ? ?Following are the goals we discussed today:  ?(Copy and paste patient goals from clinical care plan here) ? ?Our next appointment is by telephone on 09/09/21 at 9:30 AM ? ?Please call the care guide team at [phone] if you need to cancel or reschedule your appointment.  ? ?If you are experiencing a Mental Health or [place] or need someone to talk to, please call 1-800-273-TALK (toll free, 24 hour hotline)  ? ?Patient verbalizes understanding of instructions and care plan provided today and agrees to view in [place]. Active MyChart status confirmed with patient.   ? ?Barb Merino, RN, BSN, CCM ?Care Management Coordinator ?[place] [facility]  ?Direct Phone: [phone] ? ? ?

## 2021-08-25 ENCOUNTER — Telehealth: Payer: Medicare PPO

## 2021-08-25 ENCOUNTER — Telehealth: Payer: Self-pay

## 2021-08-25 NOTE — Telephone Encounter (Signed)
?  Care Management  ? ?Follow Up Note ? ? ?08/25/2021 ?Name: Scott Farrell MRN: [id number] DOB: 11-28-1980 ? ? ?Referred by: [name], FNP ?Reason for referral : Chronic Care Management (RN CM Follow up call ) ? ? ?An unsuccessful telephone outreach was attempted today. The patient was referred to the case management team for assistance with care management and care coordination.  ? ?Follow Up Plan: A HIPPA compliant phone message was left for the patient providing contact information and requesting a return call.  ? ?Barb Merino, RN, BSN, CCM ?Care Management Coordinator ?[place] [facility]  ?Direct Phone: [phone] ? ? ?

## 2021-08-29 DIAGNOSIS — E781 Pure hyperglyceridemia: Secondary | ICD-10-CM

## 2021-09-09 ENCOUNTER — Emergency Department (HOSPITAL_COMMUNITY)
Admission: EM | Admit: 2021-09-09 | Discharge: 2021-09-10 | Disposition: A | Discharge status: Home / Self Care | Payer: Medicare PPO | Attending: Physician Assistant | Admitting: Physician Assistant

## 2021-09-09 ENCOUNTER — Telehealth: Payer: Medicare PPO

## 2021-09-09 ENCOUNTER — Other Ambulatory Visit: Payer: Self-pay

## 2021-09-09 ENCOUNTER — Encounter (HOSPITAL_COMMUNITY): Payer: Self-pay

## 2021-09-09 ENCOUNTER — Ambulatory Visit (INDEPENDENT_AMBULATORY_CARE_PROVIDER_SITE_OTHER): Payer: Medicare PPO

## 2021-09-09 ENCOUNTER — Emergency Department (HOSPITAL_COMMUNITY): Payer: Medicare PPO

## 2021-09-09 DIAGNOSIS — Z5321 Procedure and treatment not carried out due to patient leaving prior to being seen by health care provider: Secondary | ICD-10-CM | POA: Insufficient documentation

## 2021-09-09 DIAGNOSIS — Z720 Tobacco use: Secondary | ICD-10-CM

## 2021-09-09 DIAGNOSIS — R110 Nausea: Secondary | ICD-10-CM | POA: Diagnosis not present

## 2021-09-09 DIAGNOSIS — R109 Unspecified abdominal pain: Secondary | ICD-10-CM | POA: Insufficient documentation

## 2021-09-09 DIAGNOSIS — R1032 Left lower quadrant pain: Secondary | ICD-10-CM | POA: Diagnosis not present

## 2021-09-09 DIAGNOSIS — R7303 Prediabetes: Secondary | ICD-10-CM

## 2021-09-09 DIAGNOSIS — E781 Pure hyperglyceridemia: Secondary | ICD-10-CM

## 2021-09-09 LAB — CBC WITH DIFFERENTIAL/PLATELET
Abs Immature Granulocytes: 0.01 K/uL (ref 0.00–0.07)
Basophils Absolute: 0.1 K/uL (ref 0.0–0.1)
Basophils Relative: 1 %
Eosinophils Absolute: 0.1 K/uL (ref 0.0–0.5)
Eosinophils Relative: 2 %
HCT: 39.2 % (ref 39.0–52.0)
Hemoglobin: 13.5 g/dL (ref 13.0–17.0)
Immature Granulocytes: 0 %
Lymphocytes Relative: 53 %
Lymphs Abs: 2.8 K/uL (ref 0.7–4.0)
MCH: 29.4 pg (ref 26.0–34.0)
MCHC: 34.4 g/dL (ref 30.0–36.0)
MCV: 85.4 fL (ref 80.0–100.0)
Monocytes Absolute: 0.5 K/uL (ref 0.1–1.0)
Monocytes Relative: 8 %
Neutro Abs: 1.9 K/uL (ref 1.7–7.7)
Neutrophils Relative %: 36 %
Platelets: 168 K/uL (ref 150–400)
RBC: 4.59 MIL/uL (ref 4.22–5.81)
RDW: 14.4 % (ref 11.5–15.5)
WBC: 5.4 K/uL (ref 4.0–10.5)
nRBC: 0 % (ref 0.0–0.2)

## 2021-09-09 LAB — COMPREHENSIVE METABOLIC PANEL
ALT: 25 U/L (ref 0–44)
AST: 31 U/L (ref 15–41)
Albumin: 4.3 g/dL (ref 3.5–5.0)
Alkaline Phosphatase: 50 U/L (ref 38–126)
Anion gap: 10 (ref 5–15)
BUN: 14 mg/dL (ref 6–20)
CO2: 23 mmol/L (ref 22–32)
Calcium: 9.3 mg/dL (ref 8.9–10.3)
Chloride: 104 mmol/L (ref 98–111)
Creatinine, Ser: 1.07 mg/dL (ref 0.61–1.24)
GFR, Estimated: >60 mL/min (ref >60)
Glucose, Bld: 108 mg/dL — ABNORMAL HIGH (ref 70–99)
Potassium: 3.7 mmol/L (ref 3.5–5.1)
Sodium: 137 mmol/L (ref 135–145)
Total Bilirubin: 0.9 mg/dL (ref 0.3–1.2)
Total Protein: 7.1 g/dL (ref 6.5–8.1)

## 2021-09-09 LAB — URINALYSIS, ROUTINE W REFLEX MICROSCOPIC
Bilirubin Urine: NEGATIVE
Glucose, UA: NEGATIVE mg/dL
Hgb urine dipstick: NEGATIVE
Ketones, ur: NEGATIVE mg/dL
Leukocytes,Ua: NEGATIVE
Nitrite: NEGATIVE
Protein, ur: NEGATIVE mg/dL
Specific Gravity, Urine: 1.023 (ref 1.005–1.030)
pH: 6 (ref 5.0–8.0)

## 2021-09-09 LAB — LIPASE, BLOOD: Lipase: 26 U/L (ref 11–51)

## 2021-09-09 MED ORDER — IOHEXOL 300 MG/ML  SOLN
100.0000 mL | Freq: Once | INTRAMUSCULAR | Status: AC | PRN
  Administered 2021-09-09: 100 mL via INTRAVENOUS

## 2021-09-09 NOTE — Patient Instructions (Signed)
Visit Information ? ?Thank you for taking time to visit with me today. Please don't hesitate to contact me if I can be of assistance to you before our next scheduled telephone appointment. ? ?Following are the goals we discussed today:  ?(Copy and paste patient goals from clinical care plan here) ? ?Our next appointment is by telephone on 11/10/21 at 10:30 AM  ? ?Please call the care guide team at [phone] if you need to cancel or reschedule your appointment.  ? ?If you are experiencing a Mental Health or [place] or need someone to talk to, please call 1-800-273-TALK (toll free, 24 hour hotline)  ? ?Patient verbalizes understanding of instructions and care plan provided today and agrees to view in [place]. Active MyChart status confirmed with patient.   ? ?Barb Merino, RN, BSN, CCM ?Care Management Coordinator ?[place] [facility]  ?Direct Phone: [phone] ? ? ?

## 2021-09-09 NOTE — Chronic Care Management (AMB) (Signed)
?Chronic Care Management  ? ?CCM RN Visit Note ? ?09/09/2021 ?Name: Scott Farrell MRN: [id number] DOB: 02/21/1981 ? ?Subjective: ?Scott Farrell is a 41 y.o. year old male who is a primary care patient of [name], [place]. The care management team was consulted for assistance with disease management and care coordination needs.   ? ?Engaged with patient by telephone for follow up visit in response to provider referral for case management and/or care coordination services.  ? ?Consent to Services:  ?The patient was given information about Chronic Care Management services, agreed to services, and gave verbal consent prior to initiation of services.  Please see initial visit note for detailed documentation.  ? ?Patient agreed to services and verbal consent obtained.  ? ?Assessment: Review of patient past medical history, allergies, medications, health status, including review of consultants reports, laboratory and other test data, was performed as part of comprehensive evaluation and provision of chronic care management services.  ? ?SDOH (Social Determinants of Health) assessments and interventions performed:  Yes, no acute challenges  ? ?CCM Care Plan ? ?Allergies  ?Allergen Reactions  ? Metformin And Related Other (See Comments)  ?  Chest tightness  ? ? ?Outpatient Encounter Medications as of 09/09/2021  ?Medication Sig Note  ? alum & mag hydroxide-simeth (MAALOX PLUS) 400-400-40 MG/5ML suspension Take 15 mLs by mouth every 6 (six) hours as needed for indigestion. (Patient not taking: Reported on 07/10/2021)   ? benzonatate (TESSALON) 100 MG capsule Take 1 capsule (100 mg total) by mouth every 8 (eight) hours. (Patient not taking: Reported on 07/10/2021)   ? ciprofloxacin (CIPRO) 500 MG tablet Take 1 tablet (500 mg total) by mouth 2 (two) times daily. One po bid x 7 days (Patient not taking: Reported on 07/10/2021) 05/22/2021: ABT Start Date 05/17/21.  ? divalproex (DEPAKOTE) 500 MG DR tablet Take 500 mg by  mouth at bedtime.   ? HYDROcodone-acetaminophen (NORCO/VICODIN) 5-325 MG tablet Take 1 tablet by mouth every 6 (six) hours as needed for moderate pain.   ? metroNIDAZOLE (FLAGYL) 500 MG tablet Take 1 tablet (500 mg total) by mouth 4 (four) times daily. One po bid x 7 days (Patient not taking: Reported on 07/10/2021) 05/22/2021: ABT Start Date 05/17/21.   ? nicotine (NICODERM CQ - DOSED IN MG/24 HOURS) 21 mg/24hr patch Place 1 patch (21 mg total) onto the skin daily.   ? ondansetron (ZOFRAN-ODT) 4 MG disintegrating tablet '4mg'$  ODT q4 hours prn nausea/vomit (Patient not taking: Reported on 07/10/2021)   ? risperidone (RISPERDAL) 4 MG tablet Take 4 mg by mouth 2 (two) times daily.   ? senna-docusate (SENOKOT-S) 8.6-50 MG tablet Take 1 tablet by mouth daily.   ? ?No facility-administered encounter medications on file as of 09/09/2021.  ? ? ?Patient Active Problem List  ? Diagnosis Date Noted  ? Chest pain of uncertain etiology 51/70/0174  ? Tobacco abuse 09/19/2020  ? Prediabetes 09/13/2020  ? High triglycerides 09/13/2020  ? Other headache syndrome 07/31/2019  ? 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 Lightheaded 07/31/2019  ? Callus of foot 01/27/2018  ? Unspecified constipation 04/27/2012  ? Family history of malignant neoplasm of gastrointestinal tract 04/27/2012  ? 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 ? ?Conditions to be addressed/monitored: Prediabetes, High Triglycerides, Tobacco Use ? ?Care Plan : RN Care Manager Plan of Care  ?Updates made by Lynne Logan, RN since 09/09/2021 12:00 AM  ?  ? ?Problem: No plan of care established for management of chronic disease states (Prediabetes, High  Triglycerides, Tobacco Use)   ?Priority: High  ?  ? ?Long-Range Goal: Establishment of plan of care for management of chronic disease states (Prediabetes, High Triglycerides, Tobacco Use)   ?Start Date: 07/18/2021  ?Expected End Date: 07/17/2022  ?Recent Progress: On track  ?Priority: High  ?Note:   ?Current Barriers:  ?Knowledge Deficits related to  plan of care for management of Prediabetes, High Triglycerides, Tobacco Use  ?Chronic Disease Management support and education needs related to Prediabetes, High Triglycerides, Tobacco Use  ? ?RNCM Clinical Goal(s):  ?Patient will verbalize basic understanding of  Prediabetes, High Triglycerides, Tobacco Use disease process and self health management plan as evidenced by patient  ?take all medications exactly as prescribed and will call provider for medication related questions as evidenced by demonstrate improved understanding of prescribed medications and rationale for usage as evidenced by patient teach back ?demonstrate Improved health management independence as evidenced by patient will report 100% adherence to his prescribed treatment plan  ?continue to work with RN Care Manager to address care management and care coordination needs related to  Prediabetes, High Triglycerides, Tobacco Use as evidenced by adherence to CM Team Scheduled appointments ?demonstrate ongoing self health care management ability   as evidenced by    through collaboration with RN Care manager, provider, and care team.  ? ?Interventions: ?1:1 collaboration with primary care provider regarding development and update of comprehensive plan of care as evidenced by provider attestation and co-signature ?Inter-disciplinary care team collaboration (see longitudinal plan of care) ?Evaluation of current treatment plan related to  self management and patient's adherence to plan as established by provider ? ?Prediabetes Interventions:  (Status:  Goal on track:  Yes.) Long Term Goal ?Assessed patient's understanding of A1c goal:  <5.7% ?Provided education to patient about basic DM disease process ?Review of patient status, including review of consultant's reports, relevant laboratory and other test results, and medications completed. ?Confirmed patient received the printed educational materials previously mailed, he denies having questions at this  time  ?Re-educated patient on dietary and exercise recommendations  ?Discussed importance of keeping all MD appointments for ongoing follow up as directed  ?Discussed plans with patient for ongoing care management follow up and provided patient with direct contact information for care management team ?Lab Results  ?Component Value Date  ? HGBA1C 5.7 (H) 09/03/2020  ?Smoking Cessation Interventions:  (Status:  Goal on track:  Yes.)  Long Term Goal ?Evaluation of current treatment plan related to Tobacco Use, self-management and patient's adherence to plan as established by provider ?Discussed patient smokes about 1 pack of cigarettes over 1-2 weeks due to cost ?Determined patient will retry Nicoderm patches to assist with smoking cessation ?Discussed PCP Rx for Nicoderm '21mg'$ /24hr patch was sent to preferred pharmacy on 08/11/21, patient admits he had forgotten to pick up this Rx but plans to do so soon and start the patches to help him quit smoking  ?Discussed plans with patient for ongoing care management follow up and provided patient with direct contact information for care management team ? ?Hyperlipidemia Interventions:  (Status:  Goal on track:  Yes.) Long Term Goal ?Evaluation of current treatment plan related to elevated Triglycerides, self-management and patient's adherence to plan as established by provider ?Review of patient status, including review of consultant's reports, relevant laboratory and other test results, and medications completed ?Re-Educated patient on dietary and exercise recommendations ?Confirmed patient received and reviewed the printed educational materials related to Triglycerides; Cooking to lower Cholesterol, he denies having questions at this  time   ?Technical sales engineer related to getting a Gaffer; Food to Lower Triglycerides; Foods to help raise HDL  ?Discussed plans with patient for ongoing care management follow up and provided  patient with direct contact information for care management team ?Lipid Panel  ?   ?Component Value Date/Time  ? CHOL 142 12/16/2020 1255  ? TRIG 164 (H) 12/16/2020 1255  ? HDL 27 (L) 12/16/2020 1255  ? CHOLHDL

## 2021-09-09 NOTE — ED Provider Triage Note (Signed)
Emergency Medicine Provider Triage Evaluation Note ? ?Scott Farrell , a 41 y.o. male  was evaluated in triage.  Pt complains of 1 hour of abdominal pain.  He states he feels like his abdomen is bloated.  He is nauseous.  He has not vomited.  He reports he had 1 bowel movement that was strained.  He states his abdomen hurts very bad, he is concerned that he may have diverticulitis.  He denies any urinary symptoms. ? ? ?Physical Exam  ?BP 136/83 (BP Location: Right Arm)   Pulse 71   Temp 98.4 ?F (36.9 ?C) (Oral)   Resp 16   Ht '6\' 3"'$  (1.905 m)   Wt 112.9 kg   SpO2 95%   BMI 31.12 kg/m?  ?Gen:   Awake, no distress   ?Resp:  Normal effort  ?MSK:   Moves extremities without difficulty  ?Other:  Abdomen appears slightly distended.  There is tenderness to palpation over the left mid abdomen.  Questionable edema felt in this area. ? ?Medical Decision Making  ?Medically screening exam initiated at 9:29 PM.  Appropriate orders placed.  Scott Farrell was informed that the remainder of the evaluation will be completed by another provider, this initial triage assessment does not replace that evaluation, and the importance of remaining in the ED until their evaluation is complete. ? ? ?  ?Lorin Glass, PA-C ?09/09/21 2130 ? ?

## 2021-09-09 NOTE — ED Triage Notes (Signed)
Pt constipated and abdomen is tight and cramping. Pt also c/o nausea. Feels like diverticulitis flare up.  ?

## 2021-09-10 ENCOUNTER — Ambulatory Visit (HOSPITAL_COMMUNITY)
Admission: EM | Admit: 2021-09-10 | Discharge: 2021-09-10 | Disposition: A | Discharge status: Home / Self Care | Payer: Medicare PPO | Attending: Nurse Practitioner | Admitting: Nurse Practitioner

## 2021-09-10 ENCOUNTER — Encounter (HOSPITAL_COMMUNITY): Payer: Self-pay | Admitting: *Deleted

## 2021-09-10 ENCOUNTER — Other Ambulatory Visit: Payer: Self-pay

## 2021-09-10 DIAGNOSIS — Z8719 Personal history of other diseases of the digestive system: Secondary | ICD-10-CM | POA: Diagnosis not present

## 2021-09-10 DIAGNOSIS — R1032 Left lower quadrant pain: Secondary | ICD-10-CM

## 2021-09-10 DIAGNOSIS — F251 Schizoaffective disorder, depressive type: Secondary | ICD-10-CM | POA: Diagnosis not present

## 2021-09-10 MED ORDER — AMOXICILLIN-POT CLAVULANATE 875-125 MG PO TABS: 1.0000 | ORAL_TABLET | Freq: Two times a day (BID) | ORAL | 0 refills | Status: AC

## 2021-09-10 NOTE — ED Triage Notes (Signed)
Pt reports a flare up of Diverticulitis . Pt was in ED yesterday and had a CT of ABD,blood work . Pt left before being seen. ?

## 2021-09-10 NOTE — ED Notes (Signed)
Patient left due to long wait time.will got urgent care in the morning ?

## 2021-09-10 NOTE — ED Provider Notes (Signed)
?[facility] ? ? ? ?CSN: [id number] ?Arrival date & time: 09/10/21  1043 ? ? ?  ? ?History   ?Chief Complaint ?Chief Complaint  ?Patient presents with  ? Constipation  ? Abdominal Pain  ? Back Pain  ? ? ?HPI ?Scott Farrell is a 41 y.o. male.  ? ?Patient presents with abdominal pain ongoing since last night.  Reports he went to the emergency room and had blood work as well as CT imaging done, left without being seen.  Reports the pain onset is sudden and severity severe.  Describes the pain as cramping and discomfort.  The pain lasts constant, waxes and wanes. The patient denies radiation of pain to back, other side of abdomen, and leg. The patient denies fever, nausea/vomiting, diarrhea, blood in stool, heartburn, rash, dysuria/urinary frequency, and hematuria. The patient endorses decreased appetite and constipation. Has not tried anything for his symptoms.  Has not had any vomiting, felt a little bit sick on his stomach earlier, however it has passed.  He is drinking water and keeping it down.    Movement, certain positions, and eating make the pain worse.  ? ?Of note, patient has had frequent diverticulitis flares over the past year.  Also has a history of IBS.  He follows closely with his primary care provider.  He has an appointment with a gastroenterologist later this month. ? ? ? ?Past Medical History:  ?Diagnosis Date  ? Clotting disorder ([place])   ? personal hx/o? etiology unclear  ? Costochondritis   ? history of  ? Herpes genitalia 1/12  ? History of echocardiogram 01/07/11  ? normal LV function, EF 60%, trace mitral, tricuspid and pulmonic regurgitation; Dr. Tollie Eth  ? IBS (irritable bowel syndrome)   ? Mental disorder 2013  ? hospitalized for psychiatric illness prior  ? Normal cardiac stress test 01/07/11  ? negative treadmill test, no evidence of ischemia, good exercise capacity; Tollie Eth, MD;   ? Prostatitis 11/2010  ? Dr. Karsten Ro  ? Renal stone 06/2012  ? Urology consult  ?  Schizophrenia ([place])   ? [facility], [place] - Dr. Lennon Alstrom  ? Tobacco use disorder   ? ? ?Patient Active Problem List  ? Diagnosis Date Noted  ? Chest pain of uncertain etiology 97/67/3419  ? Tobacco abuse 09/19/2020  ? Prediabetes 09/13/2020  ? High triglycerides 09/13/2020  ? Other headache syndrome 07/31/2019  ? 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 Lightheaded 07/31/2019  ? Callus of foot 01/27/2018  ? Unspecified constipation 04/27/2012  ? Family history of malignant neoplasm of gastrointestinal tract 04/27/2012  ? 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 ? ?Past Surgical History:  ?Procedure Laterality Date  ? EUS  06/30/2012  ? Procedure: UPPER ENDOSCOPIC ULTRASOUND (EUS) LINEAR;  Surgeon: Milus Banister, MD;  Location: [facility] ENDOSCOPY;  Service: Endoscopy;  Laterality: N/A;  radial linear  ? WISDOM TOOTH EXTRACTION    ? ? ? ? ? ?Home Medications   ? ?Prior to Admission medications   ?Medication Sig Start Date End Date Taking? Authorizing Provider  ?amoxicillin-clavulanate (AUGMENTIN) 875-125 MG tablet Take 1 tablet by mouth 2 (two) times daily for 7 days. 09/10/21 09/17/21 Yes Eulogio Bear, NP  ?divalproex (DEPAKOTE) 500 MG DR tablet Take 500 mg by mouth at bedtime. 08/06/20   [provider]  ?nicotine (NICODERM CQ - DOSED IN MG/24 HOURS) 21 mg/24hr patch Place 1 patch (21 mg total) onto the skin daily. 08/11/21 08/11/22  [name], [place]  ?risperidone (RISPERDAL)  4 MG tablet Take 4 mg by mouth 2 (two) times daily. 03/24/21   [provider]  ?senna-docusate (SENOKOT-S) 8.6-50 MG tablet Take 1 tablet by mouth daily. 05/15/21   Hans Eden, NP  ? ? ?Family History ?Family History  ?Problem Relation Age of Onset  ? Hypertension Mother   ? Stroke Mother   ? Depression Mother   ? Acne Mother   ? Thyroid disease Mother   ? Diabetes Mother   ? Colon cancer Mother 36  ? Acute lymphoblastic leukemia Father   ? Diabetes Father   ? Cancer Brother   ? Breast cancer Maternal Aunt   ? Ovarian cancer  Maternal Aunt   ? ? ?Social History ?Social History  ? ?Tobacco Use  ? Smoking status: Some Days  ?  Packs/day: 0.25  ?  Years: 23.00  ?  Pack years: 5.75  ?  Types: Cigars, Cigarettes  ?  Passive exposure: Never  ? Smokeless tobacco: Never  ? Tobacco comments:  ?  07/10/2021, smokes 4 [place] cigars daily  ?Vaping Use  ? Vaping Use: Some days  ? Substances: Nicotine  ?Substance Use Topics  ? Alcohol use: Yes  ?  Comment: occ  ? Drug use: Yes  ?  Types: Hydrocodone  ? ? ? ?Allergies   ?Metformin and related ? ? ?Review of Systems ?Review of Systems ?Per HPI ? ?Physical Exam ?Triage Vital Signs ?ED Triage Vitals  ?Enc Vitals Group  ?   BP 09/10/21 1247 127/71  ?   Pulse Rate 09/10/21 1247 (!) 54  ?   Resp 09/10/21 1247 18  ?   Temp 09/10/21 1247 (!) 97.4 ?F (36.3 ?C)  ?   Temp src --   ?   SpO2 09/10/21 1247 97 %  ?   Weight --   ?   Height --   ?   Head Circumference --   ?   Peak Flow --   ?   Pain Score 09/10/21 1246 4  ?   Pain Loc --   ?   Pain Edu? --   ?   Excl. in [place]? --   ? ?No data found. ? ?Updated Vital Signs ?BP 127/71   Pulse (!) 54   Temp (!) 97.4 ?F (36.3 ?C)   Resp 18   SpO2 97%  ? ?Visual Acuity ?Right Eye Distance:   ?Left Eye Distance:   ?Bilateral Distance:   ? ?Right Eye Near:   ?Left Eye Near:    ?Bilateral Near:    ? ?Physical Exam ?Vitals and nursing note reviewed.  ?Constitutional:   ?   General: He is not in acute distress. ?   Appearance: He is well-developed. He is not toxic-appearing.  ?HENT:  ?   Head: Normocephalic and atraumatic.  ?Cardiovascular:  ?   Rate and Rhythm: Regular rhythm. Bradycardia present.  ?Pulmonary:  ?   Effort: Pulmonary effort is normal. No respiratory distress.  ?Abdominal:  ?   General: Abdomen is flat. Bowel sounds are decreased.  ?   Palpations: Abdomen is soft. There is no mass.  ?   Tenderness: There is abdominal tenderness in the left lower quadrant. There is no right CVA tenderness or left CVA tenderness.  ?Skin: ?   General: Skin is warm and dry.  ?    Capillary Refill: Capillary refill takes less than 2 seconds.  ?   Coloration: Skin is not cyanotic, jaundiced or pale.  ?   Findings:  No rash.  ?Neurological:  ?   Mental Status: He is alert and oriented to person, place, and time.  ?Psychiatric:     ?   Behavior: Behavior is cooperative.  ? ? ? ?UC Treatments / Results  ?Labs ?(all labs ordered are listed, but only abnormal results are displayed) ?Labs Reviewed - No data to display ? ?EKG ? ? ?Radiology ?CT ABDOMEN PELVIS W CONTRAST ? ?Result Date: 09/09/2021 ?CLINICAL DATA:  Left lower quadrant pain EXAM: CT ABDOMEN AND PELVIS WITH CONTRAST TECHNIQUE: Multidetector CT imaging of the abdomen and pelvis was performed using the standard protocol following bolus administration of intravenous contrast. RADIATION DOSE REDUCTION: This exam was performed according to the departmental dose-optimization program which includes automated exposure control, adjustment of the mA and/or kV according to patient size and/or use of iterative reconstruction technique. CONTRAST:  159m OMNIPAQUE IOHEXOL 300 MG/ML  SOLN COMPARISON:  CT 05/22/2021, 05/16/2021, 04/01/2020, 07/06/2012 FINDINGS: Lower chest: Lung bases demonstrate no acute consolidation or effusion. Normal cardiac size. Hepatobiliary: No focal liver abnormality is seen. No gallstones, gallbladder wall thickening, or biliary dilatation. Pancreas: Unremarkable. No pancreatic ductal dilatation or surrounding inflammatory changes. Spleen: Normal in size without focal abnormality. Adrenals/Urinary Tract: Adrenal glands are unremarkable. Kidneys are normal, without renal calculi, focal lesion, or hydronephrosis. Bladder is unremarkable. Stomach/Bowel: Stomach is within normal limits. Appendix appears normal. No evidence of bowel wall thickening, distention, or inflammatory changes. Diverticular disease of the distal colon without acute wall thickening Vascular/Lymphatic: Nonaneurysmal aorta. No increasing adenopathy. For  Reproductive: Prostate is unremarkable. Other: Negative for pelvic effusion or free air. 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 Musculoskeletal: No acute or significant

## 2021-09-10 NOTE — Discharge Instructions (Addendum)
-   Please push clear liquids and only drink/eat clear liquids for the next 2 to 3 days; this should allow your bowel to rest and heal ?-If the pain has not improved tomorrow, please start the Augmentin (antibiotic) ?-If your pain worsens or you start developing vomiting, high fevers, chills, body aches, please seek emergent care ? ?

## 2021-09-15 ENCOUNTER — Telehealth: Payer: Self-pay

## 2021-09-15 NOTE — Chronic Care Management (AMB) (Addendum)
? ? ?Chronic Care Management ?Pharmacy Assistant  ? ?Name: Scott Farrell  MRN: [id number] DOB: 30-Jun-1980 ? ?Reason for Encounter: Disease State/ General ? ?Recent office visits:  ?09-09-2021 Little, Claudette Stapler, RN (CCM) ? ?08-19-2021 Little, Claudette Stapler, RN (CCM) ? ?08-11-2021 [name], [place]. Nicoderm CQ 24 hr patch prescribed. Referral placed to gastro. ? ?07-18-2021 Little, Claudette Stapler, RN (CCM) ? ?07-10-2021 Kellie Simmering, LPN. Medicare wellness visit.  ? ?Recent consult visits:  ?None ? ?Hospital visits:  ?Medication Reconciliation was completed by comparing discharge summary, patient?s EMR and Pharmacy list, and upon discussion with patient. ? ?Admitted to the hospital on 09-10-2021 due to LLQ abdominal pain. Discharge date was 09-10-2021. Discharged from [facility] urgent care.  ? ?New?Medications Started at [facility] Discharge:?? ?Augmentin 875-125 mg twice daily for 7 days ? ?Medication Changes at Hospital Discharge: ?None ? ?Medications Discontinued at Hospital Discharge: ?Completed Maalox, Tessalon, Cipro, Hydrocodone, Flagyl and zofran ? ?Medications that remain the same after Hospital Discharge:??  ?-All other medications will remain the same.   ? ?Hospital visits:  ?Medication Reconciliation was completed by comparing discharge summary, patient?s EMR and Pharmacy list, and upon discussion with patient. ? ?Admitted to the hospital on 05-22-2021 due to Diverticulitis. Discharge date was 05-22-2021. Discharged from [facility]. ? ?New?Medications Started at [facility] Discharge:?? ?None ? ?Medication Changes at Hospital Discharge: ?None ? ?Medications Discontinued at Hospital Discharge: ?None ? ?Medications that remain the same after Hospital Discharge:??  ?-All other medications will remain the same.   ? ?Hospital visits:  ?Medication Reconciliation was completed by comparing discharge summary, patient?s EMR and Pharmacy list, and upon discussion with patient. ?  ?Admitted to the hospital on  05-16-2021 due to Diverticulitis Discharge date was 05-16-2021 Discharged from [facility].   ?  ?New?Medications Started at [facility] Discharge:?? ?Cipro 500 mg twice daily for 7 days ?HYDROcodone-acetaminophen 5-325 mg 1 tablet every 6 hours as needed ?Flagyl 500 mg 4 times daily ?Zofran 4 mg every 4 hours as needed  ?  ?Medication Changes at Hospital Discharge: ?None ?  ?Medications Discontinued at Hospital Discharge: ?None ?  ?Medications that remain the same after Hospital Discharge:??  ?-All other medications will remain the same.   ? ?Hospital visits:  ?Medication Reconciliation was completed by comparing discharge summary, patient?s EMR and Pharmacy list, and upon discussion with patient. ?  ?Admitted to the hospital on 05-15-2021 due to Lower abdominal pain Discharge date was 05-15-2021 Discharged from [facility] health urgent care. ?  ?New?Medications Started at [facility] Discharge:?? ?MAALOX PLUS 400-400-40 mg/5 ml take 15 mls every 6 hours as needed  ?Lactulose 10 mg daily as needed ?Senokot 8.6-50 mg daily ?  ?Medication Changes at Hospital Discharge: ?None ?  ?Medications Discontinued at Hospital Discharge: ?Completed Prednisone and promethazine  ?  ?Medications that remain the same after Hospital Discharge:??  ?-All other medications will remain the same.  ?  ?Hospital visits:  ?Medication Reconciliation was completed by comparing discharge summary, patient?s EMR and Pharmacy list, and upon discussion with patient. ?  ?Admitted to the hospital on 05-12-2021 due to Viral upper respiratory tract infection Discharge date was 05-12-2021 Discharged from [facility] health urgent care ?  ?New?Medications Started at [facility] Discharge:?? ?Benzonate 100 mg every 8 hours as needed. ?  ?Medication Changes at Hospital Discharge: ?None ?  ?Medications Discontinued at Hospital Discharge: ?None ?  ?Medications that remain the same after Hospital Discharge:??  ?-All other medications will remain the same.   ?   ?Medications: ?  Outpatient Encounter Medications as of 09/15/2021  ?Medication Sig  ? amoxicillin-clavulanate (AUGMENTIN) 875-125 MG tablet Take 1 tablet by mouth 2 (two) times daily for 7 days.  ? divalproex (DEPAKOTE) 500 MG DR tablet Take 500 mg by mouth at bedtime.  ? nicotine (NICODERM CQ - DOSED IN MG/24 HOURS) 21 mg/24hr patch Place 1 patch (21 mg total) onto the skin daily.  ? risperidone (RISPERDAL) 4 MG tablet Take 4 mg by mouth 2 (two) times daily.  ? senna-docusate (SENOKOT-S) 8.6-50 MG tablet Take 1 tablet by mouth daily.  ? ?No facility-administered encounter medications on file as of 09/15/2021.  ?Contacted Scott Farrell for general disease state and medication adherence call.  ? ?Patient is not > 5 days past due for refill on the following medications per chart history: ? ?What concerns do you have about your medications? Patient stated amoxicillin is giving him diarrhea. Asked patient when does he take antibiotic and he stated in the morning on an empty stomach and at night. Recommended patient to take medication on a full stomach instead. ? ?The patient denies side effects with his medications.  ? ?How often do you forget or accidentally miss a dose? Never ? ?Do you use a pillbox? No ? ?Are you having any problems getting your medications from your pharmacy? No ? ?Has the cost of your medications been a concern? No ? ?Since last visit with CPP, the following interventions have been made:  ? ?The patient has had an ED visit since last contact.  ? ?The patient denies problems with their health.  ? ?he denies  concerns or questions for Scott Farrell, at this time.  ? ?Patient doesn't check blood pressure or blood sugar. Patient hasn't picked up nicoderm CQ yet and is smoking about a pack of cigarettes weekly. ? ? ? ?09-15-2021: Called Scott Farrell, No answer, left message of appointment on 09-23-2021 at 3:00 via telephone visit with Scott Farrell, Pharm D. Notified to have all  medications, supplements, blood pressure and/or blood sugar logs available during appointment and to return call if need to reschedule. ? ?Care Gaps: ?Colonoscopy overdue ?AWV 07-10-2021 ?Tdap overdue ? ?Star Rating Drugs: ?None ? ?Scott Farrell CMA ?Clinical Pharmacist Assistant ?[phone] ? ?

## 2021-09-23 ENCOUNTER — Telehealth: Payer: Medicare PPO

## 2021-09-23 ENCOUNTER — Telehealth: Payer: Self-pay

## 2021-09-23 NOTE — Telephone Encounter (Signed)
?  Pharmacist Care Management  ? ?Follow Up Note ? ? ?09/23/2021 ?Name: Scott Farrell MRN: [id number] DOB: 19-May-1981 ? ? ?Referred by: [name], FNP ?Reason for referral : No chief complaint on file. ? ?Successful contact was made with the patient. He has a GI appointment on Thursday and would like to have a follow up visit after that time. I rescheduled the visit to May 5 th, 2023.  ? ?Follow Up Plan: Telephone follow up appointment with care management team member scheduled for:  10/03/2021 ? ?[name], CPP, PharmD ?Clinical Pharmacist Practitioner ?[facility] ?[phone] ? ? ?

## 2021-09-24 NOTE — Progress Notes (Signed)
? ? ? ?09/25/2021 ?Scott Farrell ?[id number] ?September 05, 1980 ? ?Referring provider: [name], FNP ?Primary GI doctor: [name], previously known to Dr. [name]. ? ?ASSESSMENT AND PLAN:  ? ?Alternating constipation and diarrhea ?Family history of colon cancer ?History of adenomatous polyp of colon ?08/09/2012 colonoscopy with Dr. [name] due to family history colon cancer in mother 29 Suprep used with excellent prep.   ?Flat tubular adenoma polyp, sessile serrated adenomatous polyp otherwise normal.  Recall 5 years. ?-     CBC with Differential/Platelet; Future ?-     Comprehensive metabolic panel ?-     C-reactive protein; Future ?-     Sedimentation rate; Future ?-     DG Abd 1 View; Future ?I believe he likely has IBS-C or alternating, will get KUB to evaluate ?Has family history of colon cancer in mother at age 43 ?Will schedule for colonoscopy ? ?LLQ abdominal pain ?09/09/2021 CT abdomen pelvis with contrast in the ER for left lower abdominal pain showed normal liver, no gallstones no biliary dilatation unremarkable pancreas, normal spleen, normal stomach normal appendix, no bowel thickening or inflammatory changes.  Diverticular disease distal colon without acute wall thickening ?-     CBC with Differential/Platelet; Future ?-     Comprehensive metabolic panel ?-     dicyclomine (BENTYL) 20 MG tablet; Take 1 tablet (20 mg total) by mouth 4 (four) times daily -  before meals and at bedtime. ?Normal CT AB and pelvis, likely IBS-C, will schedule for colonoscopy ? ?Upper abdominal pain ?-     CBC with Differential/Platelet; Future ?-     Comprehensive metabolic panel ?-     dicyclomine (BENTYL) 20 MG tablet; Take 1 tablet (20 mg total) by mouth 4 (four) times daily -  before meals and at bedtime. ?-     pantoprazole (PROTONIX) 40 MG tablet; Take 1 tablet (40 mg total) by mouth daily before breakfast. ?Normal CT AB and pelvis, normal liver labs, if negtiave EGd and medications are not helping, consider AB  US/HIDA ? ?Patient Care Team: ?[name], FNP as PCP - General (General Practice) ?Mayford Knife, RPH (Pharmacist) ?Little, Claudette Stapler, RN as [place] Management ? ?HISTORY OF PRESENT ILLNESS: ?41 y.o. male with a past medical history of schizophrenia, tobacco use, and others listed below presents for evaluation of IBS and right-sided abdominal pain..  ? ?08/09/2012 colonoscopy with Dr. [name] due to family history colon cancer in mother 52 Suprep used with excellent prep.  Flat tubular adenoma polyp, sessile serrated adenomatous polyp otherwise normal.  Recall 5 years. ?06/07/2012 endoscopy abnormal CT of GI tract was completely normal recommend EUS. ?09/09/2021 CT abdomen pelvis with contrast in the ER for left lower abdominal pain showed normal liver, no gallstones no biliary dilatation unremarkable pancreas, normal spleen, normal stomach normal appendix, no bowel thickening or inflammatory changes.  Diverticular disease distal colon without acute wall thickening ? ?Patient reports GERD, occ feeling of bile in his throat, worse with spicy foods. Has upper AB pain, worse without foods.  ?No nocturnal symptoms.  ?When he get up in the morning will have periumblical AB pain, feels like a punch in the gut, feels better after he has a BM.  ?Patient denies dysphagia, nausea, vomiting. ?He has a BM 2-3 x a week.  ?He has alternating constipation/diarrhea, can be hard to push.  ?States he has seen dark black stool but was taking pepto at the time, has had BRB after hard BM.  ?Denies  changes in appetite, unintentional weight loss.  ? ?ETOH twice a month. Daily cigarettes and cigars, no drug use.  ?Excerderin migraine once a month.  ? ?Current Medications:  ? ? ? ? ? ? ?Current Outpatient Medications (Other):  ?  ARIPiprazole (ABILIFY) 5 MG tablet, Take 5 mg by mouth daily. ?  dicyclomine (BENTYL) 20 MG tablet, Take 1 tablet (20 mg total) by mouth 4 (four) times daily -  before meals and at  bedtime. ?  divalproex (DEPAKOTE) 500 MG DR tablet, Take 500 mg by mouth at bedtime. ?  nicotine (NICODERM CQ - DOSED IN MG/24 HOURS) 21 mg/24hr patch, Place 1 patch (21 mg total) onto the skin daily. ?  pantoprazole (PROTONIX) 40 MG tablet, Take 1 tablet (40 mg total) by mouth daily before breakfast. ?  risperidone (RISPERDAL) 4 MG tablet, Take 4 mg by mouth 2 (two) times daily. ?  senna-docusate (SENOKOT-S) 8.6-50 MG tablet, Take 1 tablet by mouth daily. (Patient not taking: Reported on 09/25/2021) ? ?Medical History:  ?Past Medical History:  ?Diagnosis Date  ? Clotting disorder ([place])   ? personal hx/o? etiology unclear  ? Costochondritis   ? history of  ? Diverticulitis   ? Herpes genitalia 06/2010  ? History of echocardiogram 01/07/2011  ? normal LV function, EF 60%, trace mitral, tricuspid and pulmonic regurgitation; Dr. Tollie Eth  ? IBS (irritable bowel syndrome)   ? Mental disorder 2013  ? hospitalized for psychiatric illness prior  ? Normal cardiac stress test 01/07/2011  ? negative treadmill test, no evidence of ischemia, good exercise capacity; Tollie Eth, MD;   ? Prostatitis 11/2010  ? Dr. Karsten Ro  ? Renal stone 06/2012  ? Urology consult  ? Schizophrenia ([place])   ? [facility]iatry, [place] - Dr. Lennon Alstrom  ? Tobacco use disorder   ? ?Allergies:  ?Allergies  ?Allergen Reactions  ? Metformin And Related Other (See Comments)  ?  Chest tightness  ?  ? ?Surgical History:  ?He  has a past surgical history that includes EUS (06/30/2012) and Wisdom tooth extraction. ?Family History:  ?His family history includes Acne in his mother; Acute lymphoblastic leukemia in his father; Breast cancer in his maternal aunt; Cancer in his brother; Colon cancer (age of onset: 27) in his mother; Depression in his mother; Diabetes in his father and mother; Hypertension in his mother; Ovarian cancer in his maternal aunt; Stroke in his mother; Thyroid disease in his mother. ?Social History:  ? reports that he has been  smoking cigars and cigarettes. He has a 5.75 pack-year smoking history. He has never been exposed to tobacco smoke. He has never used smokeless tobacco. He reports current alcohol use. He reports current drug use. Drug: Hydrocodone. ? ?REVIEW OF SYSTEMS  : All other systems reviewed and negative except where noted in the History of Present Illness. ? ?PHYSICAL EXAM: ?BP 132/74   Pulse 60   Ht '6\' 3"'$  (1.905 m)   Wt 250 lb (113.4 kg)   SpO2 96%   BMI 31.25 kg/m?  ?General:   Pleasant, well developed male in no acute distress ?Head:   Normocephalic and atraumatic. ?Eyes:  sclerae anicteric,conjunctive pink  ?Heart:   regular rate and rhythm ?Pulm:  Clear anteriorly; no wheezing ?Abdomen:   Soft, Obese AB, Active bowel sounds. moderate tenderness in the entire abdomen. With guarding and Without rebound, No organomegaly appreciated. ?Extremities:  Without edema. ?Msk: Symmetrical without gross deformities. Peripheral pulses intact.  ?Neurologic:  Alert and  oriented x4;  No focal deficits.  ?Skin:   Dry and intact without significant lesions or rashes. ?Psychiatric:  Cooperative. Normal mood and affect. ? ?Vladimir Crofts, PA-C ?3:20 PM ? ? ?

## 2021-09-25 ENCOUNTER — Ambulatory Visit: Payer: Medicare PPO | Admitting: Physician Assistant

## 2021-09-25 ENCOUNTER — Ambulatory Visit (INDEPENDENT_AMBULATORY_CARE_PROVIDER_SITE_OTHER)
Admission: RE | Admit: 2021-09-25 | Discharge: 2021-09-25 | Disposition: A | Discharge status: Home / Self Care | Payer: Medicare PPO | Source: Ambulatory Visit | Attending: Physician Assistant | Admitting: Physician Assistant

## 2021-09-25 ENCOUNTER — Encounter: Payer: Self-pay | Admitting: Physician Assistant

## 2021-09-25 ENCOUNTER — Other Ambulatory Visit (INDEPENDENT_AMBULATORY_CARE_PROVIDER_SITE_OTHER): Payer: Medicare PPO

## 2021-09-25 VITALS — BP 132/74 | HR 60 | Ht 75.0 in | Wt 250.0 lb

## 2021-09-25 DIAGNOSIS — R198 Other specified symptoms and signs involving the digestive system and abdomen: Secondary | ICD-10-CM

## 2021-09-25 DIAGNOSIS — R1032 Left lower quadrant pain: Secondary | ICD-10-CM | POA: Diagnosis not present

## 2021-09-25 DIAGNOSIS — K5900 Constipation, unspecified: Secondary | ICD-10-CM | POA: Diagnosis not present

## 2021-09-25 DIAGNOSIS — R1010 Upper abdominal pain, unspecified: Secondary | ICD-10-CM

## 2021-09-25 DIAGNOSIS — Z800 Family history of malignant neoplasm of digestive organs: Secondary | ICD-10-CM | POA: Diagnosis not present

## 2021-09-25 DIAGNOSIS — Z86010 Personal history of colonic polyps: Secondary | ICD-10-CM | POA: Diagnosis not present

## 2021-09-25 LAB — CBC WITH DIFFERENTIAL/PLATELET
Basophils Absolute: 0 10*3/uL (ref 0.0–0.1)
Basophils Relative: 1.1 % (ref 0.0–3.0)
Eosinophils Absolute: 0.1 10*3/uL (ref 0.0–0.7)
Eosinophils Relative: 2 % (ref 0.0–5.0)
HCT: 41.3 % (ref 39.0–52.0)
Hemoglobin: 13.8 g/dL (ref 13.0–17.0)
Lymphocytes Relative: 49.8 % — ABNORMAL HIGH (ref 12.0–46.0)
Lymphs Abs: 2.2 10*3/uL (ref 0.7–4.0)
MCHC: 33.5 g/dL (ref 30.0–36.0)
MCV: 87.3 fl (ref 78.0–100.0)
Monocytes Absolute: 0.4 10*3/uL (ref 0.1–1.0)
Monocytes Relative: 9.2 % (ref 3.0–12.0)
Neutro Abs: 1.7 10*3/uL (ref 1.4–7.7)
Neutrophils Relative %: 37.9 % — ABNORMAL LOW (ref 43.0–77.0)
Platelets: 156 10*3/uL (ref 150.0–400.0)
RBC: 4.72 Mil/uL (ref 4.22–5.81)
RDW: 14.4 % (ref 11.5–15.5)
WBC: 4.4 10*3/uL (ref 4.0–10.5)

## 2021-09-25 LAB — SEDIMENTATION RATE: Sed Rate: 7 mm/hr (ref 0–15)

## 2021-09-25 LAB — C-REACTIVE PROTEIN: CRP: <1 mg/dL (ref 0.5–20.0)

## 2021-09-25 MED ORDER — NA SULFATE-K SULFATE-MG SULF 17.5-3.13-1.6 GM/177ML PO SOLN: ORAL | 0 refills | Status: DC

## 2021-09-25 MED ORDER — PANTOPRAZOLE SODIUM 40 MG PO TBEC: 40.0000 mg | DELAYED_RELEASE_TABLET | Freq: Every day | ORAL | 0 refills | Status: DC

## 2021-09-25 MED ORDER — DICYCLOMINE HCL 20 MG PO TABS: 20.0000 mg | ORAL_TABLET | Freq: Three times a day (TID) | ORAL | 0 refills | Status: DC

## 2021-09-25 NOTE — Patient Instructions (Addendum)
Your provider has requested that you go to the basement level for lab work before leaving today. Press "B" on the elevator. The lab is located at the first door on the left as you exit the elevator.  ? ?Your provider has requested that you have an abdominal x ray before leaving today. Please go to the basement floor to our Radiology department for the test.  ? ?If you are age 41 or older, your body mass index should be between 23-30. Your Body mass index is 31.25 kg/m?Marland Kitchen If this is out of the aforementioned range listed, please consider follow up with your Primary Care Provider. ? ?If you are age 37 or younger, your body mass index should be between 19-25. Your Body mass index is 31.25 kg/m?Marland Kitchen If this is out of the aformentioned range listed, please consider follow up with your Primary Care Provider.  ? ?________________________________________________________ ? ?The [facility] providers would like to encourage you to use [facility] to communicate with providers for non-urgent requests or questions.  Due to long hold times on the telephone, sending your provider a message by [facility] may be a faster and more efficient way to get a response.  Please allow 48 business hours for a response.  Please remember that this is for non-urgent requests.  ?_______________________________________________________  ? ?Due to recent changes in healthcare laws, you may see the results of your imaging and laboratory studies on MyChart before your provider has had a chance to review them.  We understand that in some cases there may be results that are confusing or concerning to you. Not all laboratory results come back in the same time frame and the provider may be waiting for multiple results in order to interpret others.  Please give [place] 48 hours in order for your provider to thoroughly review all the results before contacting the office for clarification of your results.   ? ?I appreciate the  opportunity to care for you ? ?Thank You   ? ?Amanda Collier,PA-C ? ? ?

## 2021-09-28 DIAGNOSIS — E781 Pure hyperglyceridemia: Secondary | ICD-10-CM | POA: Diagnosis not present

## 2021-10-01 ENCOUNTER — Telehealth: Payer: Self-pay

## 2021-10-01 DIAGNOSIS — E119 Type 2 diabetes mellitus without complications: Secondary | ICD-10-CM | POA: Diagnosis not present

## 2021-10-01 NOTE — Chronic Care Management (AMB) (Signed)
?  Scott Farrell was reminded to have all medications, supplements and any blood glucose and blood pressure readings available for review with [name], Pharm. D, at his telephone visit on 10-03-2021 at 11:00. ? ? ?Malecca Hicks CMA ?Clinical Pharmacist Assistant ?[phone] ? ? ?

## 2021-10-03 ENCOUNTER — Ambulatory Visit (INDEPENDENT_AMBULATORY_CARE_PROVIDER_SITE_OTHER): Payer: Medicare PPO

## 2021-10-03 DIAGNOSIS — Z720 Tobacco use: Secondary | ICD-10-CM

## 2021-10-03 DIAGNOSIS — E781 Pure hyperglyceridemia: Secondary | ICD-10-CM

## 2021-10-03 NOTE — Progress Notes (Signed)
? ?Chronic Care Management ?Pharmacy Note ? ?10/10/2021 ?Name:  Scott Farrell MRN:  [id number] DOB:  [birth year] ? ?Summary: ?Patient reports starts nicotine patches today.  ? ?Recommendations/Changes made from today's visit: ?Recommend patient try using nicorette gum in addition to patches ?Recommend patient be started on statin due to high triglycerides ? ?Plan: ?Patient reports being interested in using nicorette gum in addition to using patches, I agreed with this option  ?Collaborate with PCP team to determine if patient is a good candidate for statin ? ? ?Subjective: ?Scott Farrell is an 41 y.o. year old male who is a primary patient of [name], [place].  The CCM team was consulted for assistance with disease management and care coordination needs.   ? ?Engaged with patient by telephone for follow up visit in response to provider referral for pharmacy case management and/or care coordination services.  ? ?Consent to Services:  ?The patient was given information about Chronic Care Management services, agreed to services, and gave verbal consent prior to initiation of services.  Please see initial visit note for detailed documentation.  ? ?Patient Care Team: ?[name], FNP as PCP - General (General Practice) ?Mayford Knife, RPH (Pharmacist) ?Little, Claudette Stapler, RN as [place] Management ? ?Recent office visits: ?05/29/2021 PCP OV ? ?Recent consult visits: ?09/25/2020 Gertie Fey OV ? ?Hospital visits: ?09/09/2021 ED OV ? ? ?Objective: ? ?Lab Results  ?Component Value Date  ? CREATININE 1.07 09/09/2021  ? BUN 14 09/09/2021  ? EGFR 93 09/03/2020  ? GFRNONAA >60 09/09/2021  ? GFRAA 101 05/27/2020  ? NA 137 09/09/2021  ? K 3.7 09/09/2021  ? CALCIUM 9.3 09/09/2021  ? CO2 23 09/09/2021  ? GLUCOSE 108 (H) 09/09/2021  ? ? ?Lab Results  ?Component Value Date/Time  ? HGBA1C 5.7 (H) 09/03/2020 04:40 PM  ? HGBA1C 6.0 (H) 05/27/2020 03:52 PM  ?  ?Last diabetic Eye exam: No results found for:  HMDIABEYEEXA  ?Last diabetic Foot exam: No results found for: HMDIABFOOTEX  ? ?Lab Results  ?Component Value Date  ? CHOL 142 12/16/2020  ? HDL 27 (L) 12/16/2020  ? [place] 86 12/16/2020  ? TRIG 164 (H) 12/16/2020  ? CHOLHDL 5.3 (H) 12/16/2020  ? ? ? ?  Latest Ref Rng & Units 09/09/2021  ?  9:43 PM 05/22/2021  ?  3:42 PM 05/16/2021  ?  5:47 PM  ?Hepatic Function  ?Total Protein 6.5 - 8.1 g/dL 7.1   7.1   7.5    ?Albumin 3.5 - 5.0 g/dL 4.3   3.8   4.1    ?AST 15 - 41 U/L 31   80   19    ?ALT 0 - 44 U/L 25   72   17    ?Alk Phosphatase 38 - 126 U/L 50   44   48    ?Total Bilirubin 0.3 - 1.2 mg/dL 0.9   0.5   1.1    ? ? ?Lab Results  ?Component Value Date/Time  ? TSH 2.610 08/11/2021 03:43 PM  ? TSH 2.680 09/27/2019 11:47 AM  ? ? ? ?  Latest Ref Rng & Units 09/25/2021  ?  4:07 PM 09/09/2021  ?  9:43 PM 08/11/2021  ?  3:43 PM  ?CBC  ?WBC 4.0 - 10.5 K/uL 4.4   5.4   4.9    ?Hemoglobin 13.0 - 17.0 g/dL 13.8   13.5   14.3    ?Hematocrit 39.0 - 52.0 % 41.3  39.2   42.7    ?Platelets 150.0 - 400.0 K/uL 156.0   168   186    ? ? ?No results found for: VD25OH ? ?Clinical ASCVD: No  ?The 10-year ASCVD risk score (Arnett DK, et al., 2019) is: 6.5% ?  Values used to calculate the score: ?    Age: 16 years ?    Sex: Male ?    Is Non-Hispanic African American: Yes ?    Diabetic: No ?    Tobacco smoker: Yes ?    Systolic Blood Pressure: 951 mmHg ?    Is BP treated: No ?    HDL Cholesterol: 27 mg/dL ?    Total Cholesterol: 142 mg/dL   ? ? ?  07/10/2021  ?  3:32 PM 09/03/2020  ?  3:03 PM 07/03/2020  ?  3:52 PM  ?Depression screen PHQ 2/9  ?Decreased Interest 0 0 1  ?Down, Depressed, Hopeless 0 0 1  ?PHQ - 2 Score 0 0 2  ?Altered sleeping   1  ?Tired, decreased energy   2  ?Change in appetite   0  ?Feeling bad or failure about yourself    1  ?Trouble concentrating   0  ?Moving slowly or fidgety/restless   0  ?Suicidal thoughts   0  ?PHQ-9 Score   6  ?Difficult doing work/chores   Somewhat difficult  ?  ? ? ?Social History  ? ?Tobacco Use   ?Smoking Status Some Days  ? Packs/day: 0.25  ? Years: 23.00  ? Pack years: 5.75  ? Types: Cigars, Cigarettes  ? Passive exposure: Never  ?Smokeless Tobacco Never  ?Tobacco Comments  ? 07/10/2021, smokes 4 [place] cigars daily  ? ?BP Readings from Last 3 Encounters:  ?09/25/21 132/74  ?09/10/21 127/71  ?09/10/21 (!) 126/94  ? ?Pulse Readings from Last 3 Encounters:  ?09/25/21 60  ?09/10/21 (!) 54  ?09/10/21 (!) 52  ? ?Wt Readings from Last 3 Encounters:  ?09/25/21 250 lb (113.4 kg)  ?09/09/21 249 lb (112.9 kg)  ?08/11/21 247 lb (112 kg)  ? ?BMI Readings from Last 3 Encounters:  ?09/25/21 31.25 kg/m?  ?09/09/21 31.12 kg/m?  ?08/11/21 30.47 kg/m?  ? ? ?Assessment/Interventions: Review of patient past medical history, allergies, medications, health status, including review of consultants reports, laboratory and other test data, was performed as part of comprehensive evaluation and provision of chronic care management services.  ? ?SDOH:  (Social Determinants of Health) assessments and interventions performed: Yes ? ?SDOH Screenings  ? ?Alcohol Screen: Not on file  ?Depression (PHQ2-9): Low Risk   ? PHQ-2 Score: 0  ?Financial Resource Strain: Low Risk   ? Difficulty of Paying Living Expenses: Not hard at all  ?Food Insecurity: No Food Insecurity  ? Worried About Charity fundraiser in the Last Year: Never true  ? Ran Out of Food in the Last Year: Never true  ?Housing: Medium Risk  ? Last Housing Risk Score: 1  ?Physical Activity: Insufficiently Active  ? Days of Exercise per Week: 4 days  ? Minutes of Exercise per Session: 20 min  ?Social Connections: Not on file  ?Stress: No Stress Concern Present  ? Feeling of Stress : Not at all  ?Tobacco Use: High Risk  ? Smoking Tobacco Use: Some Days  ? Smokeless Tobacco Use: Never  ? Passive Exposure: Never  ?Transportation Needs: No Transportation Needs  ? Lack of Transportation (Medical): No  ? Lack of Transportation (Non-Medical): No  ? ? ?CCM Care  Plan ? ?Allergies  ?Allergen  Reactions  ? Metformin And Related Other (See Comments)  ?  Chest tightness  ? ? ?Medications Reviewed Today   ? ? Reviewed by Annabell Howells, CMA (Certified Medical Assistant) on 09/25/21 at 1445  Med List Status: <None>  ? ?Medication Order Taking? Sig Documenting Provider Last Dose Status Informant  ?ARIPiprazole (ABILIFY) 5 MG tablet [id number] Yes Take 5 mg by mouth daily. [provider] Taking Active   ?divalproex (DEPAKOTE) 500 MG DR tablet [id number] Yes Take 500 mg by mouth at bedtime. [provider] Taking Active Self  ?nicotine (NICODERM CQ - DOSED IN MG/24 HOURS) 21 mg/24hr patch [id number] Yes Place 1 patch (21 mg total) onto the skin daily. [name], FNP Taking Active   ?risperidone (RISPERDAL) 4 MG tablet [id number] Yes Take 4 mg by mouth 2 (two) times daily. [provider] Taking Active Self  ?senna-docusate (SENOKOT-S) 8.6-50 MG tablet [id number] No Take 1 tablet by mouth daily.  ?Patient not taking: Reported on 09/25/2021  ? Hans Eden, NP Not Taking Active Self  ? ?  ?  ? ?  ? ? ?Patient Active Problem List  ? Diagnosis Date Noted  ? Chest pain of uncertain etiology 94/70/9628  ? Tobacco abuse 09/19/2020  ? Prediabetes 09/13/2020  ? High triglycerides 09/13/2020  ? Other headache syndrome 07/31/2019  ? 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 Lightheaded 07/31/2019  ? Callus of foot 01/27/2018  ? Unspecified constipation 04/27/2012  ? Family history of malignant neoplasm of gastrointestinal tract 04/27/2012  ? 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 ? ?Immunization History  ?Administered Date(s) Administered  ? Influenza Inj Mdck Quad Pf 06/02/2018  ? Influenza-Unspecified 03/02/2020  ? Moderna Sars-Covid-2 Vaccination 09/18/2019, 10/19/2019, 05/18/2020  ? Pension scheme manager 21yr & up 05/28/2021  ? ? ?Conditions to be addressed/monitored:  ?Tobacco use ? ?Care Plan : [place] ?Updates made by PMayford Knife RPH since 10/10/2021 12:00 AM  ?   ? ?Problem: GERD, Tobacco Use   ?Priority: High  ?  ? ?Long-Range Goal: Disease Management   ?This Visit's Progress: On track  ?Recent Progress: On track  ?Priority: High  ?Note:   ?Current Barriers:  ?Unable

## 2021-10-09 ENCOUNTER — Encounter: Payer: Self-pay | Admitting: Gastroenterology

## 2021-10-27 ENCOUNTER — Encounter: Payer: Self-pay | Admitting: Certified Registered Nurse Anesthetist

## 2021-10-29 DIAGNOSIS — E781 Pure hyperglyceridemia: Secondary | ICD-10-CM

## 2021-10-30 ENCOUNTER — Telehealth: Payer: Self-pay

## 2021-10-30 NOTE — Progress Notes (Signed)
    Called Scott Farrell, No answer, left message of appointment on 10-30-2021 at 10:00 via telephone visit with [name], Pharm D. Notified to have all medications, supplements, blood pressure and/or blood sugar logs available during appointment and to return call if need to reschedule. Asked patient to call back if he can do appointment at 9:30.   [place] Pharmacist Assistant [phone]

## 2021-10-31 ENCOUNTER — Ambulatory Visit (AMBULATORY_SURGERY_CENTER): Payer: Medicare PPO | Admitting: Gastroenterology

## 2021-10-31 ENCOUNTER — Telehealth: Payer: Medicare PPO

## 2021-10-31 ENCOUNTER — Telehealth: Payer: Self-pay

## 2021-10-31 ENCOUNTER — Encounter: Payer: Self-pay | Admitting: Gastroenterology

## 2021-10-31 VITALS — BP 104/72 | HR 52 | Temp 97.3°F | Resp 13 | Ht 75.0 in | Wt 250.0 lb

## 2021-10-31 DIAGNOSIS — R198 Other specified symptoms and signs involving the digestive system and abdomen: Secondary | ICD-10-CM

## 2021-10-31 DIAGNOSIS — R1012 Left upper quadrant pain: Secondary | ICD-10-CM | POA: Diagnosis not present

## 2021-10-31 DIAGNOSIS — F209 Schizophrenia, unspecified: Secondary | ICD-10-CM | POA: Diagnosis not present

## 2021-10-31 DIAGNOSIS — K5730 Diverticulosis of large intestine without perforation or abscess without bleeding: Secondary | ICD-10-CM

## 2021-10-31 DIAGNOSIS — D123 Benign neoplasm of transverse colon: Secondary | ICD-10-CM

## 2021-10-31 DIAGNOSIS — D122 Benign neoplasm of ascending colon: Secondary | ICD-10-CM | POA: Diagnosis not present

## 2021-10-31 MED ORDER — SODIUM CHLORIDE 0.9 % IV SOLN: 500.0000 mL | Freq: Once | INTRAVENOUS | Status: DC

## 2021-10-31 NOTE — Progress Notes (Signed)
HPI: This is a man with FH of CRC, alternating bowel habits  07/2012 colonoscopy with Dr. [name] due to family history colon cancer in mother at age 41.  Flat tubular adenoma polyp, sessile serrated adenomatous polyp otherwise normal.    ROS: complete GI ROS as described in HPI, all other review negative.  Constitutional:  No unintentional weight loss   Past Medical History:  Diagnosis Date   Clotting disorder ([place])    personal hx/o? etiology unclear   Costochondritis    history of   Diverticulitis    Herpes genitalia 06/2010   History of echocardiogram 01/07/2011   normal LV function, EF 60%, trace mitral, tricuspid and pulmonic regurgitation; Dr. Tollie Eth   IBS (irritable bowel syndrome)    Mental disorder 2013   hospitalized for psychiatric illness prior   Normal cardiac stress test 01/07/2011   negative treadmill test, no evidence of ischemia, good exercise capacity; Tollie Eth, MD;    Prostatitis 11/2010   Dr. Karsten Ro   Renal stone 06/2012   Urology consult   Schizophrenia [facility])    [facility], [facility] - Dr. Lennon Alstrom   Tobacco use disorder     Past Surgical History:  Procedure Laterality Date   EUS  06/30/2012   Procedure: UPPER ENDOSCOPIC ULTRASOUND (EUS) LINEAR;  Surgeon: Milus Banister, MD;  Location: [facility] ENDOSCOPY;  Service: Endoscopy;  Laterality: N/A;  radial linear   WISDOM TOOTH EXTRACTION      Current Outpatient Medications  Medication Sig Dispense Refill   divalproex (DEPAKOTE) 500 MG DR tablet Take 500 mg by mouth at bedtime.     risperidone (RISPERDAL) 4 MG tablet Take 4 mg by mouth 2 (two) times daily.     ARIPiprazole (ABILIFY) 5 MG tablet Take 5 mg by mouth daily.     dicyclomine (BENTYL) 20 MG tablet Take 1 tablet (20 mg total) by mouth 4 (four) times daily -  before meals and at bedtime. (Patient not taking: Reported on 10/31/2021) 60 tablet 0   nicotine (NICODERM CQ - DOSED IN MG/24 HOURS) 21 mg/24hr patch Place 1 patch (21 mg  total) onto the skin daily. (Patient not taking: Reported on 10/31/2021) 30 patch 1   pantoprazole (PROTONIX) 40 MG tablet Take 1 tablet (40 mg total) by mouth daily before breakfast. 30 tablet 0   senna-docusate (SENOKOT-S) 8.6-50 MG tablet Take 1 tablet by mouth daily. (Patient not taking: Reported on 09/25/2021) 30 tablet 0   Current Facility-Administered Medications  Medication Dose Route Frequency Provider Last Rate Last Admin   0.9 %  sodium chloride infusion  500 mL Intravenous Once Milus Banister, MD        Allergies as of 10/31/2021 - Review Complete 10/31/2021  Allergen Reaction Noted   Metformin and related Other (See Comments) 09/03/2020    Family History  Problem Relation Age of Onset   Hypertension Mother    Stroke Mother    Depression Mother    Acne Mother    Thyroid disease Mother    Diabetes Mother    Colon cancer Mother 28   Acute lymphoblastic leukemia Father    Diabetes Father    Cancer Brother    Breast cancer Maternal Aunt    Ovarian cancer Maternal Aunt    Esophageal cancer Neg Hx    Stomach cancer Neg Hx     Social History   Socioeconomic History   Marital status: Single    Spouse name: Not on file   Number of  children: 1   Years of education: Not on file   Highest education level: Not on file  Occupational History   Occupation: [employer]  Tobacco Use   Smoking status: Some Days    Packs/day: 0.25    Years: 23.00    Pack years: 5.75    Types: Cigars, Cigarettes    Passive exposure: Never   Smokeless tobacco: Never   Tobacco comments:    07/10/2021, smokes 4 [place] cigars daily  Vaping Use   Vaping Use: Some days   Substances: Nicotine  Substance and Sexual Activity   Alcohol use: Yes    Comment: occ   Drug use: Yes    Types: Hydrocodone   Sexual activity: Not Currently    Partners: Female  Other Topics Concern   Not on file  Social History Narrative   Lives with mother, [name], exercising with walking, running, no current  relationship; unemployed   Caffeine use: 1 cup per day   Right handed   Social Determinants of Health   Financial Resource Strain: Low Risk    Difficulty of Paying Living Expenses: Not hard at all  Food Insecurity: No Food Insecurity   Worried About Charity fundraiser in the Last Year: Never true   [place] in the Last Year: Never true  Transportation Needs: No Transportation Needs   Lack of Transportation (Medical): No   Lack of Transportation (Non-Medical): No  Physical Activity: Insufficiently Active   Days of Exercise per Week: 4 days   Minutes of Exercise per Session: 20 min  Stress: No Stress Concern Present   Feeling of Stress : Not at all  Social Connections: Not on file  Intimate Partner Violence: Not on file     Physical Exam: BP (!) 126/58   Pulse 62   Temp (!) 97.3 F (36.3 C) (Skin)   Ht '6\' 3"'$  (1.905 m)   Wt 250 lb (113.4 kg)   SpO2 97%   BMI 31.25 kg/m  Constitutional: generally well-appearing Psychiatric: alert and oriented x3 Lungs: CTA bilaterally Heart: no MCR  Assessment and plan: 41 y.o. male with FH CRC, alternating bowel habits.  Colonoscopy today  Care is appropriate for the ambulatory setting.  Owens Loffler, MD [facility] 10/31/2021, 2:51 PM

## 2021-10-31 NOTE — Progress Notes (Signed)
Pt's states no medical or surgical changes since previsit or office visit. 

## 2021-10-31 NOTE — Op Note (Signed)
[place] Patient Name: Scott Farrell Procedure Date: 10/31/2021 2:54 PM MRN: [id number] Endoscopist: Milus Banister , MD Age: 41 Referring MD:  Date of Birth: 1981-04-27 Gender: Male Account #: [id number] Procedure:                Colonoscopy Indications:              Abdominal pain in the left lower quadrant, h/o                            sigmoid diverticulitis (proven on 2022 CT). 07/2012                            colonoscopy with Dr. [name] due to family                            historycolon cancer in mother at age 70. Flat                            tubular adenoma polyp, sessile serrated adenomatous                            polyp otherwise normal. Medicines:                Monitored Anesthesia Care Procedure:                Pre-Anesthesia Assessment:                           - Prior to the procedure, a History and Physical                            was performed, and patient medications and                            allergies were reviewed. The patient's tolerance of                            previous anesthesia was also reviewed. The risks                            and benefits of the procedure and the sedation                            options and risks were discussed with the patient.                            All questions were answered, and informed consent                            was obtained. Prior Anticoagulants: The patient has                            taken no previous anticoagulant or antiplatelet  agents. ASA Grade Assessment: II - A patient with                            mild systemic disease. After reviewing the risks                            and benefits, the patient was deemed in                            satisfactory condition to undergo the procedure.                           After obtaining informed consent, the colonoscope                            was passed under direct vision. Throughout  the                            procedure, the patient's blood pressure, pulse, and                            oxygen saturations were monitored continuously. The                            CF [id number] #[id number] was introduced through the anus                            and advanced to the the cecum, identified by                            appendiceal orifice and ileocecal valve. The                            Olympus [id number] (#[id number]) Colonoscope was                            introduced through the and advanced to the. The                            colonoscopy was performed without difficulty. The                            patient tolerated the procedure well. The quality                            of the bowel preparation was good. The ileocecal                            valve, appendiceal orifice, and rectum were                            photographed. Scope In: 2:59:40 PM Scope Out: 3:24:01 PM Scope Withdrawal Time: 0 hours 11 minutes 1 second  Total Procedure Duration: 0 hours  24 minutes 21 seconds  Findings:                 Four sessile polyps were found in the transverse                            colon and ascending colon. The polyps were 3 to 6                            mm in size. These polyps were removed with a cold                            snare. Resection and retrieval were complete.                           Multiple small and large-mouthed diverticula were                            found in the left colon. This was associated with                            mucosal edema, erythme, luminal narrowing and                            tortuosity.                           The exam was otherwise without abnormality on                            direct and retroflexion views. Complications:            No immediate complications. Estimated blood loss:                            None. Estimated Blood Loss:     Estimated blood loss: none. Impression:               - Four  3 to 6 mm polyps in the transverse colon and                            in the ascending colon, removed with a cold snare.                            Resected and retrieved.                           - Diverticulosis in the left colon, associated with                            mucosal edema, erythme, luminal narrowing and                            tortuosity.                           -  The examination was otherwise normal on direct                            and retroflexion views. Recommendation:           - Patient has a contact number available for                            emergencies. The signs and symptoms of potential                            delayed complications were discussed with the                            patient. Return to normal activities tomorrow.                            Written discharge instructions were provided to the                            patient.                           - Resume previous diet.                           - Continue present medications. Please stop the                            previously prescibed bentyl. Instead please start                            daily OTC fiber supplement citrucel (every single                            day in a large glass of water).                           - Await pathology results.                           - Dr. Ardis Hughs' office will reach out to arrange                            follow up visit in about 2 months to see how your                            bowels have responded to daily fiber supplement. Milus Banister, MD 10/31/2021 3:30:20 PM This report has been signed electronically.

## 2021-10-31 NOTE — Progress Notes (Signed)
Called to room to assist during endoscopic procedure.  Patient ID and intended procedure confirmed with present staff. Received instructions for my participation in the procedure from the performing physician.  

## 2021-10-31 NOTE — Patient Instructions (Signed)
Try to eat plenty of fiber, and drink lots of water.  REad all of the handouts given to you by your recovery room nurse.  YOU HAD AN ENDOSCOPIC PROCEDURE TODAY AT [facility]:   Refer to the procedure report that was given to you for any specific questions about what was found during the examination.  If the procedure report does not answer your questions, please call your gastroenterologist to clarify.  If you requested that your care partner not be given the details of your procedure findings, then the procedure report has been included in a sealed envelope for you to review at your convenience later.  YOU SHOULD EXPECT: Some feelings of bloating in the abdomen. Passage of more gas than usual.  Walking can help get rid of the air that was put into your GI tract during the procedure and reduce the bloating. If you had a lower endoscopy (such as a colonoscopy or flexible sigmoidoscopy) you may notice spotting of blood in your stool or on the toilet paper. If you underwent a bowel prep for your procedure, you may not have a normal bowel movement for a few days.  Please Note:  You might notice some irritation and congestion in your nose or some drainage.  This is from the oxygen used during your procedure.  There is no need for concern and it should clear up in a day or so.  SYMPTOMS TO REPORT IMMEDIATELY:  Following lower endoscopy (colonoscopy or flexible sigmoidoscopy):  Excessive amounts of blood in the stool  Significant tenderness or worsening of abdominal pains  Swelling of the abdomen that is new, acute  Fever of 100F or higher   For urgent or emergent issues, a gastroenterologist can be reached at any hour by calling [phone]. Do not use MyChart messaging for urgent concerns.    DIET:  We do recommend a small meal at first, but then you may proceed to your regular diet.  Drink plenty of fluids but you should avoid alcoholic beverages for 24 hours. Eat a high fiber  diet, and drink plenty of water.  ACTIVITY:  You should plan to take it easy for the rest of today and you should NOT DRIVE or use heavy machinery until tomorrow (because of the sedation medicines used during the test).    FOLLOW UP: Our staff will call the number listed on your records 48-72 hours following your procedure to check on you and address any questions or concerns that you may have regarding the information given to you following your procedure. If we do not reach you, we will leave a message.  We will attempt to reach you two times.  During this call, we will ask if you have developed any symptoms of COVID 19. If you develop any symptoms (ie: fever, flu-like symptoms, shortness of breath, cough etc.) before then, please call [phone].  If you test positive for Covid 19 in the 2 weeks post procedure, please call and report this information to [place].    If any biopsies were taken you will be contacted by phone or by letter within the next 1-3 weeks.  Please call us at [phone] if you have not heard about the biopsies in 3 weeks.    SIGNATURES/CONFIDENTIALITY: You and/or your care partner have signed paperwork which will be entered into your electronic medical record.  These signatures attest to the fact that that the information above on your After Visit Summary has been reviewed and is  understood.  Full responsibility of the confidentiality of this discharge information lies with you and/or your care-partner.

## 2021-10-31 NOTE — Telephone Encounter (Signed)
  Care Management   Follow Up Note   10/31/2021 Name: Scott Farrell MRN: [id number] DOB: 1980/07/21   Referred by: [name], FNP Reason for referral : No chief complaint on file.   An unsuccessful telephone outreach was attempted today. The patient was referred to the case management team for assistance with care management and care coordination.   Follow Up Plan: The patient has been provided with contact information for the care management team and has been advised to call with any health related questions or concerns.   [name], CPP, PharmD Clinical Pharmacist Practitioner [facility] [phone]

## 2021-10-31 NOTE — Progress Notes (Signed)
Report given to PACU, vss 

## 2021-11-03 ENCOUNTER — Telehealth: Payer: Self-pay

## 2021-11-03 NOTE — Telephone Encounter (Signed)
No answer, left message to call if having any issues or concerns, B.Elodie Panameno RN 

## 2021-11-03 NOTE — Telephone Encounter (Signed)
Attempted to reach pt. With follow-up call following endoscopic procedure 10/31/2021.  LM On pt. Voice mail.  Will try to reach pt. Again later today.

## 2021-11-10 ENCOUNTER — Telehealth: Payer: Self-pay

## 2021-11-10 ENCOUNTER — Telehealth: Payer: Medicare PPO

## 2021-11-10 NOTE — Telephone Encounter (Signed)
  Care Management   Follow Up Note   11/10/2021 Name: Scott Farrell MRN: [id number] DOB: [birth year]   Referred by: [name], FNP Reason for referral : Chronic Care Management (RN CM Follow up call )   An unsuccessful telephone outreach was attempted today. The patient was referred to the case management team for assistance with care management and care coordination.   Follow Up Plan: Telephone follow up appointment with care management team member scheduled for: 11/26/21  Barb Merino, RN, BSN, CCM Care Management Coordinator [place] [facility]  Direct Phone: [phone]

## 2021-11-18 ENCOUNTER — Encounter: Payer: Self-pay | Admitting: Gastroenterology

## 2021-11-25 ENCOUNTER — Encounter (HOSPITAL_BASED_OUTPATIENT_CLINIC_OR_DEPARTMENT_OTHER): Payer: Self-pay | Admitting: *Deleted

## 2021-11-25 ENCOUNTER — Emergency Department (HOSPITAL_BASED_OUTPATIENT_CLINIC_OR_DEPARTMENT_OTHER)
Admission: EM | Admit: 2021-11-25 | Discharge: 2021-11-25 | Disposition: A | Discharge status: Home / Self Care | Payer: Medicare PPO | Attending: Emergency Medicine | Admitting: Emergency Medicine

## 2021-11-25 ENCOUNTER — Other Ambulatory Visit: Payer: Self-pay

## 2021-11-25 DIAGNOSIS — M79605 Pain in left leg: Secondary | ICD-10-CM | POA: Insufficient documentation

## 2021-11-25 DIAGNOSIS — M79604 Pain in right leg: Secondary | ICD-10-CM | POA: Insufficient documentation

## 2021-11-25 DIAGNOSIS — M7989 Other specified soft tissue disorders: Secondary | ICD-10-CM | POA: Insufficient documentation

## 2021-11-25 DIAGNOSIS — F209 Schizophrenia, unspecified: Secondary | ICD-10-CM | POA: Insufficient documentation

## 2021-11-25 DIAGNOSIS — M79662 Pain in left lower leg: Secondary | ICD-10-CM | POA: Diagnosis not present

## 2021-11-25 DIAGNOSIS — R030 Elevated blood-pressure reading, without diagnosis of hypertension: Secondary | ICD-10-CM | POA: Diagnosis not present

## 2021-11-25 DIAGNOSIS — F17200 Nicotine dependence, unspecified, uncomplicated: Secondary | ICD-10-CM | POA: Insufficient documentation

## 2021-11-25 MED ORDER — NAPROXEN 500 MG PO TABS
500.0000 mg | ORAL_TABLET | Freq: Two times a day (BID) | ORAL | 0 refills | Status: DC
Start: 2021-11-25 — End: 2022-06-03

## 2021-11-26 ENCOUNTER — Telehealth: Payer: Self-pay

## 2021-11-26 ENCOUNTER — Telehealth: Payer: Medicare PPO

## 2021-11-26 NOTE — Telephone Encounter (Signed)
  Care Management   Follow Up Note   11/26/2021 Name: Scott Farrell MRN: [id number] DOB: Oct 01, 1980   Referred by: [name], FNP Reason for referral : Chronic Care Management (RN CM Follow up call )   A second unsuccessful telephone outreach was attempted today. The patient was referred to the case management team for assistance with care management and care coordination.   Follow Up Plan: A HIPPA compliant phone message was left for the patient providing contact information and requesting a return call.   Barb Merino, RN, BSN, CCM Care Management Coordinator [place] [facility]  Direct Phone: [phone]

## 2021-11-27 ENCOUNTER — Ambulatory Visit (HOSPITAL_BASED_OUTPATIENT_CLINIC_OR_DEPARTMENT_OTHER)
Admission: RE | Admit: 2021-11-27 | Discharge: 2021-11-27 | Disposition: A | Discharge status: Home / Self Care | Payer: Medicare PPO | Source: Ambulatory Visit | Attending: Family Medicine | Admitting: Family Medicine

## 2021-11-27 ENCOUNTER — Telehealth: Payer: Self-pay

## 2021-11-27 ENCOUNTER — Ambulatory Visit: Payer: Medicare PPO | Admitting: Family Medicine

## 2021-11-27 VITALS — BP 110/72 | Ht 75.5 in | Wt 250.0 lb

## 2021-11-27 DIAGNOSIS — M545 Low back pain, unspecified: Secondary | ICD-10-CM | POA: Diagnosis not present

## 2021-11-27 DIAGNOSIS — M5416 Radiculopathy, lumbar region: Secondary | ICD-10-CM | POA: Diagnosis not present

## 2021-11-27 DIAGNOSIS — M7742 Metatarsalgia, left foot: Secondary | ICD-10-CM | POA: Diagnosis not present

## 2021-11-27 DIAGNOSIS — M7741 Metatarsalgia, right foot: Secondary | ICD-10-CM | POA: Insufficient documentation

## 2021-11-27 MED ORDER — PREDNISONE 5 MG PO TABS: ORAL_TABLET | ORAL | 0 refills | Status: DC

## 2021-11-27 NOTE — Progress Notes (Signed)
  Scott Farrell - 41 y.o. male MRN [id number]  Date of birth: [birth year]  SUBJECTIVE:  Including CC & ROS.  No chief complaint on file.   Scott Farrell is a 41 y.o. male that is presenting with left lower leg pain and forefoot pain on each foot.  The pain is occurring worse with standing.  Acute on chronic in nature and has had the pain for years.  Denies any surgery.  Has not tried any medications or therapy.  Reviewed the emergency department note from 6/27 shows he was provided naproxen   Review of Systems See HPI   HISTORY: Past Medical, Surgical, Social, and Family History Reviewed & Updated per EMR.   Pertinent Historical Findings include:  Past Medical History:  Diagnosis Date   Clotting disorder ([place])    personal hx/o? etiology unclear   Costochondritis    history of   Diverticulitis    Herpes genitalia 06/2010   History of echocardiogram 01/07/2011   normal LV function, EF 60%, trace mitral, tricuspid and pulmonic regurgitation; Dr. Tollie Eth   IBS (irritable bowel syndrome)    Mental disorder 2013   hospitalized for psychiatric illness prior   Normal cardiac stress test 01/07/2011   negative treadmill test, no evidence of ischemia, good exercise capacity; Tollie Eth, MD;    Prostatitis 11/2010   Dr. Karsten Ro   Renal stone 06/2012   Urology consult   Schizophrenia [facility])    [facility], [facility] - Dr. Lennon Alstrom   Tobacco use disorder     Past Surgical History:  Procedure Laterality Date   EUS  06/30/2012   Procedure: UPPER ENDOSCOPIC ULTRASOUND (EUS) LINEAR;  Surgeon: Milus Banister, MD;  Location: [facility] ENDOSCOPY;  Service: Endoscopy;  Laterality: N/A;  radial linear   WISDOM TOOTH EXTRACTION       PHYSICAL EXAM:  VS: BP 110/72 (BP Location: Left Arm, Patient Position: Sitting)   Ht 6' 3.5" (1.918 m)   Wt 250 lb (113.4 kg)   BMI 30.84 kg/m  Physical Exam Gen: NAD, alert, cooperative with exam, well-appearing MSK:   Neurovascularly intact       ASSESSMENT & PLAN:   Lumbar radiculopathy Acute on chronic in nature.  Symptoms down the left leg seem more consistent with a radicular pain. -Counseled on home exercise therapy and supportive care. -Prednisone. -Lumbar x-ray. -Could consider physical therapy.  Metatarsalgia of both feet Acute on chronic in nature.  Having pain at the forefoot on the plantar aspect.  Does seem to have a component of metatarsalgia as opposed to just lumbar radiculopathy. -Counseled on home exercise therapy and supportive care. -Green sport insoles. -Could consider custom orthotics.

## 2021-11-27 NOTE — Patient Instructions (Signed)
Nice to meet you Please try heat on the back. Please try the exercises. I will call with the x-ray results. Please try the insoles. Please send me a message in MyChart with any questions or updates.  Please see me back in 3-4 weeks.   --Dr. Raeford Razor

## 2021-11-27 NOTE — Telephone Encounter (Signed)
Transition Care Management Unsuccessful Follow-up Telephone Call  Date of discharge and from where:  [place] emergency dept.   Attempts:  1st Attempt  Reason for unsuccessful TCM follow-up call:  Left voice message

## 2021-11-27 NOTE — Assessment & Plan Note (Signed)
Acute on chronic in nature.  Symptoms down the left leg seem more consistent with a radicular pain. -Counseled on home exercise therapy and supportive care. -Prednisone. -Lumbar x-ray. -Could consider physical therapy.

## 2021-11-27 NOTE — Assessment & Plan Note (Signed)
Acute on chronic in nature.  Having pain at the forefoot on the plantar aspect.  Does seem to have a component of metatarsalgia as opposed to just lumbar radiculopathy. -Counseled on home exercise therapy and supportive care. -Green sport insoles. -Could consider custom orthotics.

## 2021-12-03 ENCOUNTER — Telehealth: Payer: Self-pay | Admitting: Family Medicine

## 2021-12-03 NOTE — Telephone Encounter (Signed)
Informed of results.   Rosemarie Ax, MD [facility] 12/03/2021, 10:40 AM

## 2021-12-15 ENCOUNTER — Ambulatory Visit (INDEPENDENT_AMBULATORY_CARE_PROVIDER_SITE_OTHER): Payer: Medicare PPO

## 2021-12-15 ENCOUNTER — Telehealth: Payer: Medicare PPO

## 2021-12-15 ENCOUNTER — Ambulatory Visit (INDEPENDENT_AMBULATORY_CARE_PROVIDER_SITE_OTHER): Payer: Medicare PPO | Admitting: Nurse Practitioner

## 2021-12-15 ENCOUNTER — Encounter: Payer: Self-pay | Admitting: Nurse Practitioner

## 2021-12-15 VITALS — BP 136/60 | HR 76 | Temp 98.6°F | Ht 75.0 in | Wt 249.4 lb

## 2021-12-15 DIAGNOSIS — Z720 Tobacco use: Secondary | ICD-10-CM

## 2021-12-15 DIAGNOSIS — Z23 Encounter for immunization: Secondary | ICD-10-CM

## 2021-12-15 DIAGNOSIS — E781 Pure hyperglyceridemia: Secondary | ICD-10-CM

## 2021-12-15 DIAGNOSIS — R220 Localized swelling, mass and lump, head: Secondary | ICD-10-CM | POA: Diagnosis not present

## 2021-12-15 DIAGNOSIS — E6609 Other obesity due to excess calories: Secondary | ICD-10-CM

## 2021-12-15 DIAGNOSIS — Z6831 Body mass index (BMI) 31.0-31.9, adult: Secondary | ICD-10-CM | POA: Diagnosis not present

## 2021-12-15 DIAGNOSIS — R7303 Prediabetes: Secondary | ICD-10-CM

## 2021-12-15 NOTE — Patient Instructions (Signed)
Visit Information  Thank you for taking time to visit with me today. Please don't hesitate to contact me if I can be of assistance to you before our next scheduled telephone appointment.  Following are the goals we discussed today:  Take all medications as prescribed Attend all scheduled provider appointments Call pharmacy for medication refills 3-7 days in advance of running out of medications Perform all self care activities independently  Call provider office for new concerns or questions  drink 6 to 8 glasses of water each day fill half of plate with vegetables manage portion size take all medications exactly as prescribed adhere to prescribed diet: low trans/saturated fats Ask you PCP about a PREP referral as discussed Continue to follow your HEP as directed by Sports Medicine   Our next appointment is by telephone on 01/02/22 at 11:15 AM   Please call the care guide team at [phone] if you need to cancel or reschedule your appointment.   If you are experiencing a Mental Health or [place] or need someone to talk to, please call 1-800-273-TALK (toll free, 24 hour hotline)   Patient verbalizes understanding of instructions and care plan provided today and agrees to view in [place]. Active MyChart status and patient understanding of how to access instructions and care plan via MyChart confirmed with patient.     Barb Merino, RN, BSN, CCM Care Management Coordinator [place] [facility]  Direct Phone: [phone]

## 2021-12-15 NOTE — Chronic Care Management (AMB) (Signed)
Chronic Care Management   CCM RN Visit Note  12/15/2021 Name: Scott Farrell MRN: [id number] DOB: 04-13-1981  Subjective: Scott Farrell is a 41 y.o. year old male who is a primary care patient of [name], [place]. The care management team was consulted for assistance with disease management and care coordination needs.    Engaged with patient by telephone for follow up visit in response to provider referral for case management and/or care coordination services.   Consent to Services:  The patient was given information about Chronic Care Management services, agreed to services, and gave verbal consent prior to initiation of services.  Please see initial visit note for detailed documentation.   Patient agreed to services and verbal consent obtained.   Assessment: Review of patient past medical history, allergies, medications, health status, including review of consultants reports, laboratory and other test data, was performed as part of comprehensive evaluation and provision of chronic care management services.   SDOH (Social Determinants of Health) assessments and interventions performed:  Yes, no acute needs  CCM Care Plan  Allergies  Allergen Reactions   Metformin And Related Other (See Comments)    Chest tightness    Outpatient Encounter Medications as of 12/15/2021  Medication Sig   ARIPiprazole (ABILIFY) 5 MG tablet Take 5 mg by mouth daily. (Patient not taking: Reported on 12/15/2021)   dicyclomine (BENTYL) 20 MG tablet Take 1 tablet (20 mg total) by mouth 4 (four) times daily -  before meals and at bedtime. (Patient not taking: Reported on 10/31/2021)   divalproex (DEPAKOTE) 500 MG DR tablet Take 500 mg by mouth at bedtime.   naproxen (NAPROSYN) 500 MG tablet Take 1 tablet (500 mg total) by mouth 2 (two) times daily.   nicotine (NICODERM CQ - DOSED IN MG/24 HOURS) 21 mg/24hr patch Place 1 patch (21 mg total) onto the skin daily. (Patient not taking: Reported on  10/31/2021)   predniSONE (DELTASONE) 5 MG tablet Take 6 pills for first day, 5 pills second day, 4 pills third day, 3 pills fourth day, 2 pills the fifth day, and 1 pill sixth day.   risperidone (RISPERDAL) 4 MG tablet Take 4 mg by mouth 2 (two) times daily.   senna-docusate (SENOKOT-S) 8.6-50 MG tablet Take 1 tablet by mouth daily. (Patient not taking: Reported on 09/25/2021)   No facility-administered encounter medications on file as of 12/15/2021.    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    Conditions to be addressed/monitored: Prediabetes, High Triglycerides, Tobacco Use  Care Plan : [place] of Care  Updates made by Lynne Logan, RN since 12/15/2021 12:00 AM     Problem: No plan of care established for management of chronic disease states (Prediabetes, High Triglycerides, Tobacco Use)   Priority: High     Long-Range Goal: Establishment of plan of care for management of chronic disease states (Prediabetes, High Triglycerides, Tobacco Use)   Start Date: 07/18/2021  Expected End Date: 07/17/2022  Recent Progress: On track  Priority: High  Note:  Current Barriers:  Knowledge Deficits related to plan of care for management of Prediabetes, High Triglycerides, Tobacco Use  Chronic Disease Management support and education needs related to Prediabetes, High Triglycerides, Tobacco Use   RNCM Clinical Goal(s):  Patient will verbalize basic understanding of  Prediabetes, High Triglycerides, Tobacco  Use disease process and self health management plan as evidenced by patient  take all medications exactly as prescribed and will call provider for medication related questions as evidenced by demonstrate improved understanding of prescribed medications and rationale for usage as evidenced by patient teach back demonstrate Improved health management independence as evidenced by patient will report 100% adherence to his prescribed treatment plan  continue to work with RN Care Manager to address care management and care coordination needs related to  Prediabetes, High Triglycerides, Tobacco Use as evidenced by adherence to CM Team Scheduled appointments demonstrate ongoing self health care management ability   as evidenced by    through collaboration with RN Care manager, provider, and care team.   Interventions: 1:1 collaboration with primary care provider regarding development and update of comprehensive plan of care as evidenced by provider attestation and co-signature Inter-disciplinary care team collaboration (see longitudinal plan of care) Evaluation of current treatment plan related to  self management and patient's adherence to plan as established by provider  Prediabetes Interventions:  (Status:  Condition stable.  Not addressed this visit.) Long Term Goal Assessed patient's understanding of A1c goal:  <5.7% Provided education to patient about basic DM disease process Review of patient status, including review of consultant's reports, relevant laboratory and other test results, and medications completed. Confirmed patient received the printed educational materials previously mailed, he denies having questions at this time  Re-educated patient on dietary and exercise recommendations  Discussed importance of keeping all MD appointments for ongoing follow up as directed  Discussed plans with patient for ongoing care management follow up and provided patient with direct contact information for  care management team Lab Results  Component Value Date   HGBA1C 5.7 (H) 09/03/2020  Smoking Cessation Interventions:  (Status:  Condition stable.  Not addressed this visit.)  Long Term Goal Evaluation of current treatment plan related to Tobacco Use, self-management and patient's adherence to plan as established by provider Discussed patient smokes about 1 pack of cigarettes over 1-2 weeks due to cost Determined patient will retry Nicoderm patches to assist with smoking cessation Discussed PCP Rx for Nicoderm 41m/24hr patch was sent to preferred pharmacy on 08/11/21, patient admits he had forgotten to pick up this Rx but plans to do so soon and start the patches to help him quit smoking  Discussed plans with patient for ongoing care management follow up and provided patient with direct contact information for care management team  Hyperlipidemia Interventions:  (Status:  Condition stable.  Not addressed this visit.) Long Term Goal Evaluation of current treatment plan related to elevated Triglycerides, self-management and patient's adherence to plan as established by provider Review of patient status, including review of consultant's reports, relevant laboratory and other test results, and medications completed Re-Educated patient on dietary and exercise recommendations Confirmed patient received and reviewed the printed educational materials related to Triglycerides; Cooking to lower Cholesterol, he denies having questions at this time   Mailed printed educational materials related to getting a Full Body Workout using Chair Exercises; Food to Lower Triglycerides; Foods to help raise HDL  Discussed plans with patient for ongoing care management follow up and provided patient with direct contact information for  care management team Lipid Panel     Component Value Date/Time   CHOL 142 12/16/2020 1255   TRIG 164 (H) 12/16/2020 1255   HDL 27 (L) 12/16/2020 1255   CHOLHDL 5.3 (H) 12/16/2020 1255    CHOLHDL 3.9 07/22/2012 0821   VLDL 21 07/22/2012 0821   LDLCALC 86 12/16/2020 1255   LABVLDL 29 12/16/2020 1255     Diverticulitis Interventions:  (Status:  Goal Met.)  Long Term Goal Evaluation of current treatment plan related to  Diverticulitis , self-management and patient's adherence to plan as established by provider Review of patient status, including review of consultant's reports, relevant laboratory and other test results, and medications completed Determined patient completed his colonoscopy with having several polyps removed, with benign pathology noted per patient  Assessed for knowledge and understanding of his prescribed treatment plan, patient will repeat colonoscopy in 3 years as directed Assessed for persistent GI symptoms suggestive of Diverticulosis, patient denies and reports bowel habits have returned to normal  Discussed plans with patient for ongoing care management follow up and provided patient with direct contact information for care management team   Pain Interventions (lower back, legs and feet):  (Status:  New goal.) Long Term Goal Reviewed and discussed recent ED visit for back and leg pain Reviewed and discussed with patient his recent follow up with Sports Medicine with the following Assessment/Plan noted;  ASSESSMENT & PLAN:  Lumbar radiculopathy Acute on chronic in nature.  Symptoms down the left leg seem more consistent with a radicular pain. -Counseled on home exercise therapy and supportive care. -Prednisone. -Lumbar x-ray. -Could consider physical therapy. Metatarsalgia of both feet Acute on chronic in nature.  Having pain at the forefoot on the plantar aspect.  Does seem to have a component of metatarsalgia as opposed to just lumbar radiculopathy. -Counseled on home exercise therapy and supportive care. -Green sport insoles. -Could consider custom orthotics. Pain assessment performed Medications reviewed Reviewed provider established plan for  pain management Discussed importance of adherence to all scheduled medical appointments Counseled on the importance of reporting any/all new or changed pain symptoms or management strategies to pain management provider Advised patient to report to care team affect of pain on daily activities Advised patient to discuss PREP with provider Assessed social determinant of health barriers Placed patient on PCP schedule to be evaluated/referred for PREP    Patient Goals/Self-Care Activities: Take all medications as prescribed Attend all scheduled provider appointments Call pharmacy for medication refills 3-7 days in advance of running out of medications Perform all self care activities independently  Call provider office for new concerns or questions  drink 6 to 8 glasses of water each day fill half of plate with vegetables manage portion size take all medications exactly as prescribed adhere to prescribed diet: low trans/saturated fats Ask you PCP about a PREP referral as discussed Continue to follow your HEP as directed by Sports Medicine   Follow Up Plan:  Telephone follow up appointment with care management team member scheduled for:  01/02/22      Barb Merino, RN, BSN, CCM Care Management Coordinator [place] [facility]  Direct Phone: [phone]

## 2021-12-15 NOTE — Patient Instructions (Signed)
Tdap (Tetanus, Diphtheria, Pertussis) Vaccine: What You Need to Know 1. Why get vaccinated? Tdap vaccine can prevent tetanus, diphtheria, and pertussis. Diphtheria and pertussis spread from person to person. Tetanus enters the body through cuts or wounds. TETANUS (T) causes painful stiffening of the muscles. Tetanus can lead to serious health problems, including being unable to open the mouth, having trouble swallowing and breathing, or death. DIPHTHERIA (D) can lead to difficulty breathing, heart failure, paralysis, or death. PERTUSSIS (aP), also known as "whooping cough," can cause uncontrollable, violent coughing that makes it hard to breathe, eat, or drink. Pertussis can be extremely serious especially in babies and young children, causing pneumonia, convulsions, brain damage, or death. In teens and adults, it can cause weight loss, loss of bladder control, passing out, and rib fractures from severe coughing. 2. Tdap vaccine Tdap is only for children 7 years and older, adolescents, and adults.  Adolescents should receive a single dose of Tdap, preferably at age 10 or 71 years. Pregnant people should get a dose of Tdap during every pregnancy, preferably during the early part of the third trimester, to help protect the newborn from pertussis. Infants are most at risk for severe, life-threatening complications from pertussis. Adults who have never received Tdap should get a dose of Tdap. Also, adults should receive a booster dose of either Tdap or Td (a different vaccine that protects against tetanus and diphtheria but not pertussis) every 10 years, or after 5 years in the case of a severe or dirty wound or burn. Tdap may be given at the same time as other vaccines. 3. Talk with your health care provider Tell your vaccine provider if the person getting the vaccine: Has had an allergic reaction after a previous dose of any vaccine that protects against tetanus, diphtheria, or pertussis, or has any  severe, life-threatening allergies Has had a coma, decreased level of consciousness, or prolonged seizures within 7 days after a previous dose of any pertussis vaccine (DTP, DTaP, or Tdap) Has seizures or another nervous system problem Has ever had Guillain-Barr Syndrome (also called "GBS") Has had severe pain or swelling after a previous dose of any vaccine that protects against tetanus or diphtheria In some cases, your health care provider may decide to postpone Tdap vaccination until a future visit. People with minor illnesses, such as a cold, may be vaccinated. People who are moderately or severely ill should usually wait until they recover before getting Tdap vaccine.  Your health care provider can give you more information. 4. Risks of a vaccine reaction Pain, redness, or swelling where the shot was given, mild fever, headache, feeling tired, and nausea, vomiting, diarrhea, or stomachache sometimes happen after Tdap vaccination. People sometimes faint after medical procedures, including vaccination. Tell your provider if you feel dizzy or have vision changes or ringing in the ears.  As with any medicine, there is a very remote chance of a vaccine causing a severe allergic reaction, other serious injury, or death. 5. What if there is a serious problem? An allergic reaction could occur after the vaccinated person leaves the clinic. If you see signs of a severe allergic reaction (hives, swelling of the face and throat, difficulty breathing, a fast heartbeat, dizziness, or weakness), call 9-1-1 and get the person to the nearest hospital. For other signs that concern you, call your health care provider.  Adverse reactions should be reported to the Vaccine Adverse Event Reporting System (VAERS). Your health care provider will usually file this report, or you  can do it yourself. Visit the VAERS website at www.vaers.SamedayNews.es or call 276-565-5733. VAERS is only for reporting reactions, and VAERS staff  members do not give medical advice. 6. The National Vaccine Injury Compensation Program The [employer] Vaccine Injury Compensation Program (VICP) is a federal program that was created to compensate people who may have been injured by certain vaccines. Claims regarding alleged injury or death due to vaccination have a time limit for filing, which may be as short as two years. Visit the VICP website at GoldCloset.com.ee or call (548) 670-1402 to learn about the program and about filing a claim. 7. How can I learn more? Ask your health care provider. Call your local or state health department. Visit the website of the Food and Drug Administration (FDA) for vaccine package inserts and additional information at TraderRating.uy. Contact the Centers for Disease Control and Prevention (CDC): Call (340)099-2071 (1-800-CDC-INFO) or Visit CDC's website at http://hunter.com/. Source: CDC Vaccine Information Statement Tdap (Tetanus, Diphtheria, Pertussis) Vaccine (01/05/2020) This same material is available at http://www.wolf.info/ for no charge. This information is not intended to replace advice given to you by your health care provider. Make sure you discuss any questions you have with your health care provider. Document Revised: 04/16/2021 Document Reviewed: 02/17/2021 Elsevier Patient Education  [place].

## 2021-12-15 NOTE — Progress Notes (Signed)
Barnet Glasgow Martin,acting as a Education administrator for [name], FNP.,have documented all relevant documentation on the behalf of [name], FNP,as directed by  [name], FNP while in the presence of [name], [place].    Subjective:     Patient ID: Scott Farrell , male    DOB: 1981/04/08 , 41 y.o.   MRN: [id number]   Chief Complaint  Patient presents with   Referral   Cyst    HPI  Patient presents today for a referral to PREP and patient also wants his cyst drained from right side of his forehead. This area has been on his head 4-5 years. It is now beginning to bother him. It has not increased in size. Denies pain. He would like this removed/drained.    He is being seen by ortho for his knee pain and back pain.   BP Readings from Last 3 Encounters: 12/15/21 : 136/60 11/27/21 : 110/72 11/25/21 : 116/75       Past Medical History:  Diagnosis Date   Clotting disorder ([place])    personal hx/o? etiology unclear   Costochondritis    history of   Diverticulitis    Herpes genitalia 06/2010   History of echocardiogram 01/07/2011   normal LV function, EF 60%, trace mitral, tricuspid and pulmonic regurgitation; Dr. Tollie Eth   IBS (irritable bowel syndrome)    Mental disorder 2013   hospitalized for psychiatric illness prior   Normal cardiac stress test 01/07/2011   negative treadmill test, no evidence of ischemia, good exercise capacity; Tollie Eth, MD;    Prostatitis 11/2010   Dr. Karsten Ro   Renal stone 06/2012   Urology consult   Schizophrenia [facility])    [facility], [facility] - Dr. Lennon Alstrom   Tobacco use disorder      Family History  Problem Relation Age of Onset   Hypertension Mother    Stroke Mother    Depression Mother    Acne Mother    Thyroid disease Mother    Diabetes Mother    Colon cancer Mother 77   Acute lymphoblastic leukemia Father    Diabetes Father    Cancer Brother    Breast cancer Maternal Aunt    Ovarian cancer Maternal Aunt     Esophageal cancer Neg Hx    Stomach cancer Neg Hx      Current Outpatient Medications:    divalproex (DEPAKOTE) 500 MG DR tablet, Take 500 mg by mouth at bedtime., Disp: , Rfl:    naproxen (NAPROSYN) 500 MG tablet, Take 1 tablet (500 mg total) by mouth 2 (two) times daily., Disp: 14 tablet, Rfl: 0   predniSONE (DELTASONE) 5 MG tablet, Take 6 pills for first day, 5 pills second day, 4 pills third day, 3 pills fourth day, 2 pills the fifth day, and 1 pill sixth day., Disp: 21 tablet, Rfl: 0   risperidone (RISPERDAL) 4 MG tablet, Take 4 mg by mouth 2 (two) times daily., Disp: , Rfl:    ARIPiprazole (ABILIFY) 5 MG tablet, Take 5 mg by mouth daily. (Patient not taking: Reported on 12/15/2021), Disp: , Rfl:    dicyclomine (BENTYL) 20 MG tablet, Take 1 tablet (20 mg total) by mouth 4 (four) times daily -  before meals and at bedtime. (Patient not taking: Reported on 10/31/2021), Disp: 60 tablet, Rfl: 0   nicotine (NICODERM CQ - DOSED IN MG/24 HOURS) 21 mg/24hr patch, Place 1 patch (21 mg total) onto the skin daily. (Patient not taking: Reported on  10/31/2021), Disp: 30 patch, Rfl: 1   senna-docusate (SENOKOT-S) 8.6-50 MG tablet, Take 1 tablet by mouth daily. (Patient not taking: Reported on 09/25/2021), Disp: 30 tablet, Rfl: 0   Allergies  Allergen Reactions   Metformin And Related Other (See Comments)    Chest tightness     Review of Systems  Constitutional: Negative.   HENT: Negative.    Eyes: Negative.   Respiratory: Negative.    Cardiovascular: Negative.   Gastrointestinal: Negative.   Endocrine: Negative.   Genitourinary: Negative.   Musculoskeletal: Negative.   Skin:        Lump to right skin  Allergic/Immunologic: Negative.   Neurological: Negative.   Hematological: Negative.   Psychiatric/Behavioral: Negative.       Today's Vitals   12/15/21 1457  BP: 136/60  Pulse: 76  Temp: 98.6 F (37 C)  TempSrc: Oral  Weight: 249 lb 6.4 oz (113.1 kg)  Height: '6\' 3"'$  (1.905 m)   PainSc: 6   PainLoc: Neck   Body mass index is 31.17 kg/m.  Wt Readings from Last 3 Encounters:  12/15/21 249 lb 6.4 oz (113.1 kg)  11/27/21 250 lb (113.4 kg)  11/25/21 250 lb (113.4 kg)    Objective:  Physical Exam Vitals reviewed.  Constitutional:      General: He is not in acute distress.    Appearance: Normal appearance. He is obese.  HENT:     Head: Normocephalic.  Cardiovascular:     Rate and Rhythm: Normal rate and regular rhythm.     Pulses: Normal pulses.     Heart sounds: Normal heart sounds. No murmur heard. Pulmonary:     Effort: Pulmonary effort is normal. No respiratory distress.     Breath sounds: Normal breath sounds. No wheezing.  Skin:    General: Skin is warm and dry.     Capillary Refill: Capillary refill takes less than 2 seconds.     Comments: Right forehead with 3.25 cm x 3.00 cm semi firm moveable lump right side forehead. Non-tender  Neurological:     General: No focal deficit present.     Mental Status: He is alert and oriented to person, place, and time.     Cranial Nerves: No cranial nerve deficit.     Motor: No weakness.  Psychiatric:        Mood and Affect: Mood normal.        Behavior: Behavior normal.        Thought Content: Thought content normal.        Judgment: Judgment normal.         Assessment And Plan:     1. Scalp lump Comments: 3.25 cm x 3.00 cm semi firm moveable lump right side forehead. Will make referral to general surgery for evaluation and possible excision - Ambulatory referral to General Surgery  2. High triglycerides Comments: He will return in am for fasting lipids.  Encourage to continue to avoid fried and fatty foods  - Lipid panel - AMB Referral to [place]  3. Tobacco abuse Comments: He has used patches before this helps him with his pain. Discussed risk of tobacco abuse.   4. Need for Tdap vaccination Will give tetanus vaccine today while in office. Refer to order management. TDAP  will be administered to adults 39-28 years old every 10 years. - Tdap vaccine greater than or equal to 7yo IM  5. Class 1 obesity due to excess calories without serious comorbidity with body mass index (BMI)  of 31.0 to 31.9 in adult - Amb Referral To Provider Referral Exercise Program (P.R.E.P) - AMB Referral to [place]     Patient was given opportunity to ask questions. Patient verbalized understanding of the plan and was able to repeat key elements of the plan. All questions were answered to their satisfaction.  [name], FNP   I, [name], FNP, have reviewed all documentation for this visit. The documentation on 12/15/21 for the exam, diagnosis, procedures, and orders are all accurate and complete.   IF YOU HAVE BEEN REFERRED TO A SPECIALIST, IT MAY TAKE 1-2 WEEKS TO SCHEDULE/PROCESS THE REFERRAL. IF YOU HAVE NOT HEARD FROM US/SPECIALIST IN TWO WEEKS, PLEASE GIVE [place] A CALL AT [phone].   THE PATIENT IS ENCOURAGED TO PRACTICE SOCIAL DISTANCING DUE TO THE COVID-19 PANDEMIC.

## 2021-12-16 ENCOUNTER — Other Ambulatory Visit: Payer: Medicare PPO

## 2021-12-16 DIAGNOSIS — E781 Pure hyperglyceridemia: Secondary | ICD-10-CM | POA: Diagnosis not present

## 2021-12-17 LAB — LIPID PANEL
Chol/HDL Ratio: 6.3 ratio — ABNORMAL HIGH (ref 0.0–5.0)
Cholesterol, Total: 169 mg/dL (ref 100–199)
HDL: 27 mg/dL — ABNORMAL LOW (ref >39)
LDL Chol Calc (NIH): 105 mg/dL — ABNORMAL HIGH (ref 0–99)
Triglycerides: 213 mg/dL — ABNORMAL HIGH (ref 0–149)
VLDL Cholesterol Cal: 37 mg/dL (ref 5–40)

## 2021-12-19 ENCOUNTER — Telehealth: Payer: Self-pay | Admitting: *Deleted

## 2021-12-19 NOTE — Chronic Care Management (AMB) (Signed)
  Chronic Care Management Note  12/19/2021 Name: Scott Farrell MRN: [id number] DOB: [birth year]  [name] is a 41 y.o. year old male who is a primary care patient of [name], [place] and is actively engaged with the care management team. I reached out to [place] by phone today to assist with re-scheduling a follow up visit with the RN Case Manager  Follow up plan: Unsuccessful telephone outreach attempt made. A HIPAA compliant phone message was left for the patient providing contact information and requesting a return call.  The care management team will reach out to the patient again over the next 7 days.  If patient returns call to provider office, please advise to call [place] at [phone].  [place]  Direct Dial: [phone]

## 2021-12-29 DIAGNOSIS — E781 Pure hyperglyceridemia: Secondary | ICD-10-CM

## 2021-12-31 ENCOUNTER — Telehealth: Payer: Self-pay

## 2021-12-31 NOTE — Chronic Care Management (AMB) (Signed)
Rescheduled by [facility] 01/08/22  [place]  Direct Dial: [phone]

## 2021-12-31 NOTE — Chronic Care Management (AMB) (Signed)
    Chronic Care Management Pharmacy Assistant   Name: Scott Farrell  MRN: [id number] DOB: 02/03/1981  Reason for Encounter: Disease State/ General  Recent office visits:  12-15-2021 [name], [place]. Trig= 213, HDL= 27, LDL= 105, Chol/HDL= 6.3. Referral placed to PREP and general surgery.  12-15-2021 Little, Claudette Stapler, RN (CCM).   Recent consult visits:  11-27-2021 Rosemarie Ax, MD (Sports medicine). DG Lumbar Spine 2-3 Views completed. START prednisone 5 mg Take 6 pills for first day, 5 pills second day, 4 pills third day, 3 pills fourth day, 2 pills the fifth day, and 1 pill sixth day.   10-31-2021 Milus Banister, MD [facility]). Endoscopy completed.  Hospital visits:  Medication Reconciliation was completed by comparing discharge summary, patient's EMR and Pharmacy list, and upon discussion with patient.  Admitted to the hospital on 11-25-2021 due to Leg swelling. Discharge date was 11-25-2021. Discharged from [place]?Medications Started at [facility] Discharge:?? Naproxen 500 mg twice daily  Medication Changes at Hospital Discharge: None  Medications Discontinued at Hospital Discharge: None  Medications that remain the same after Hospital Discharge:??  -All other medications will remain the same.    Medications: Outpatient Encounter Medications as of 12/31/2021  Medication Sig   ARIPiprazole (ABILIFY) 5 MG tablet Take 5 mg by mouth daily. (Patient not taking: Reported on 12/15/2021)   dicyclomine (BENTYL) 20 MG tablet Take 1 tablet (20 mg total) by mouth 4 (four) times daily -  before meals and at bedtime. (Patient not taking: Reported on 10/31/2021)   divalproex (DEPAKOTE) 500 MG DR tablet Take 500 mg by mouth at bedtime.   naproxen (NAPROSYN) 500 MG tablet Take 1 tablet (500 mg total) by mouth 2 (two) times daily.   nicotine (NICODERM CQ - DOSED IN MG/24 HOURS) 21 mg/24hr patch Place 1 patch (21 mg total) onto the skin daily.  (Patient not taking: Reported on 10/31/2021)   predniSONE (DELTASONE) 5 MG tablet Take 6 pills for first day, 5 pills second day, 4 pills third day, 3 pills fourth day, 2 pills the fifth day, and 1 pill sixth day.   risperidone (RISPERDAL) 4 MG tablet Take 4 mg by mouth 2 (two) times daily.   senna-docusate (SENOKOT-S) 8.6-50 MG tablet Take 1 tablet by mouth daily. (Patient not taking: Reported on 09/25/2021)   No facility-administered encounter medications on file as of 12/31/2021.    12-31-2021: 1st attempt left VM 01-06-2022: 2nd attempt left VM 01-08-2022: 3rd attempt left VM  Care Gaps: Flu vaccine overdue  Star Rating Drugs: None  Jeannette How [facility] Clinical Pharmacist Assistant [phone]

## 2022-01-01 ENCOUNTER — Ambulatory Visit: Payer: Medicare PPO | Admitting: Family Medicine

## 2022-01-02 ENCOUNTER — Telehealth: Payer: Medicare PPO

## 2022-01-07 ENCOUNTER — Emergency Department (HOSPITAL_COMMUNITY)
Admission: EM | Admit: 2022-01-07 | Discharge: 2022-01-07 | Discharge status: Against Medical Advice | Payer: Medicare PPO | Attending: Physician Assistant | Admitting: Physician Assistant

## 2022-01-07 ENCOUNTER — Encounter (HOSPITAL_COMMUNITY): Payer: Self-pay | Admitting: *Deleted

## 2022-01-07 ENCOUNTER — Other Ambulatory Visit: Payer: Self-pay

## 2022-01-07 DIAGNOSIS — R1031 Right lower quadrant pain: Secondary | ICD-10-CM | POA: Insufficient documentation

## 2022-01-07 DIAGNOSIS — R109 Unspecified abdominal pain: Secondary | ICD-10-CM | POA: Diagnosis not present

## 2022-01-07 DIAGNOSIS — R112 Nausea with vomiting, unspecified: Secondary | ICD-10-CM | POA: Diagnosis not present

## 2022-01-07 DIAGNOSIS — Z5321 Procedure and treatment not carried out due to patient leaving prior to being seen by health care provider: Secondary | ICD-10-CM | POA: Diagnosis not present

## 2022-01-07 DIAGNOSIS — R1030 Lower abdominal pain, unspecified: Secondary | ICD-10-CM | POA: Diagnosis not present

## 2022-01-07 DIAGNOSIS — R197 Diarrhea, unspecified: Secondary | ICD-10-CM | POA: Insufficient documentation

## 2022-01-07 DIAGNOSIS — K5900 Constipation, unspecified: Secondary | ICD-10-CM | POA: Insufficient documentation

## 2022-01-07 DIAGNOSIS — R300 Dysuria: Secondary | ICD-10-CM | POA: Diagnosis not present

## 2022-01-07 LAB — CBC WITH DIFFERENTIAL/PLATELET
Abs Immature Granulocytes: 0.03 10*3/uL (ref 0.00–0.07)
Basophils Absolute: 0 10*3/uL (ref 0.0–0.1)
Basophils Relative: 0 %
Eosinophils Absolute: 0.1 10*3/uL (ref 0.0–0.5)
Eosinophils Relative: 1 %
HCT: 41.2 % (ref 39.0–52.0)
Hemoglobin: 14.2 g/dL (ref 13.0–17.0)
Immature Granulocytes: 0 %
Lymphocytes Relative: 25 %
Lymphs Abs: 2.5 10*3/uL (ref 0.7–4.0)
MCH: 29.4 pg (ref 26.0–34.0)
MCHC: 34.5 g/dL (ref 30.0–36.0)
MCV: 85.3 fL (ref 80.0–100.0)
Monocytes Absolute: 1.2 10*3/uL — ABNORMAL HIGH (ref 0.1–1.0)
Monocytes Relative: 12 %
Neutro Abs: 6.2 10*3/uL (ref 1.7–7.7)
Neutrophils Relative %: 62 %
Platelets: 184 10*3/uL (ref 150–400)
RBC: 4.83 MIL/uL (ref 4.22–5.81)
RDW: 14.5 % (ref 11.5–15.5)
WBC: 10 10*3/uL (ref 4.0–10.5)
nRBC: 0 % (ref 0.0–0.2)

## 2022-01-07 LAB — URINALYSIS, ROUTINE W REFLEX MICROSCOPIC
Bacteria, UA: NONE SEEN
Bilirubin Urine: NEGATIVE
Glucose, UA: NEGATIVE mg/dL
Ketones, ur: NEGATIVE mg/dL
Leukocytes,Ua: NEGATIVE
Nitrite: NEGATIVE
Protein, ur: NEGATIVE mg/dL
Specific Gravity, Urine: 1.015 (ref 1.005–1.030)
pH: 7 (ref 5.0–8.0)

## 2022-01-07 LAB — COMPREHENSIVE METABOLIC PANEL
ALT: 22 U/L (ref 0–44)
AST: 25 U/L (ref 15–41)
Albumin: 4.4 g/dL (ref 3.5–5.0)
Alkaline Phosphatase: 58 U/L (ref 38–126)
Anion gap: 9 (ref 5–15)
BUN: 13 mg/dL (ref 6–20)
CO2: 24 mmol/L (ref 22–32)
Calcium: 9.5 mg/dL (ref 8.9–10.3)
Chloride: 104 mmol/L (ref 98–111)
Creatinine, Ser: 1.05 mg/dL (ref 0.61–1.24)
GFR, Estimated: >60 mL/min (ref >60)
Glucose, Bld: 94 mg/dL (ref 70–99)
Potassium: 4.2 mmol/L (ref 3.5–5.1)
Sodium: 137 mmol/L (ref 135–145)
Total Bilirubin: 1 mg/dL (ref 0.3–1.2)
Total Protein: 7.6 g/dL (ref 6.5–8.1)

## 2022-01-07 LAB — LIPASE, BLOOD: Lipase: 23 U/L (ref 11–51)

## 2022-01-07 NOTE — ED Provider Triage Note (Signed)
Emergency Medicine Provider Triage Evaluation Note  Scott Farrell , a 41 y.o. male  was evaluated in triage.  Pt complains of lower abdominal pain for the past 4 days.  Localized to the right lower quadrant.  Evaluated at urgent care, noted to have blood in his urine.  Also endorsing some nausea along with vomiting.  But also endorsing constipation along with diarrhea without any blood in his stool.  Feels that his abdomen is distended pain exacerbated with lying flat.  Review of Systems  Positive: Abdominal pain,nausea, vomiting Negative: Fever, chest pain  Physical Exam  There were no vitals taken for this visit. Gen:   Awake, no distress   Resp:  Normal effort  MSK:   Moves extremities without difficulty  Other:  Abdomen feels distended, some guarding.  Pain along the lower abdomen especially right lower quadrant.  Medical Decision Making  Medically screening exam initiated at 3:34 PM.  Appropriate orders placed.  Scott Farrell was informed that the remainder of the evaluation will be completed by another provider, this initial triage assessment does not replace that evaluation, and the importance of remaining in the ED until their evaluation is complete.     Janeece Fitting, PA-C 01/07/22 1538

## 2022-01-07 NOTE — ED Triage Notes (Signed)
The pt is c/o  abd pain since Sunday  he has diarrhea and constipation  since then

## 2022-01-07 NOTE — ED Notes (Signed)
Pt no longer wanted to stay

## 2022-01-08 ENCOUNTER — Telehealth: Payer: Medicare PPO

## 2022-01-13 ENCOUNTER — Telehealth: Payer: Self-pay

## 2022-01-13 ENCOUNTER — Telehealth: Payer: Medicare PPO

## 2022-01-13 NOTE — Telephone Encounter (Signed)
  Care Management   Follow Up Note   01/13/2022 Name: Scott Farrell MRN: [id number] DOB: 07-12-1980   Referred by: [name], FNP Reason for referral : Chronic Care Management (Unsuccessful call )   An unsuccessful telephone outreach was attempted today. The patient was referred to the case management team for assistance with care management and care coordination.   Follow Up Plan: A HIPPA compliant phone message was left for the patient providing contact information and requesting a return call.   Barb Merino, RN, BSN, CCM Care Management Coordinator [place] [facility]  Direct Phone: [phone]

## 2022-02-04 ENCOUNTER — Ambulatory Visit: Payer: Self-pay

## 2022-02-04 NOTE — Patient Outreach (Signed)
  Care Coordination   02/04/2022 Name: Scott Farrell MRN: [id number] DOB: 27-Apr-1981   Care Coordination Outreach Attempts:  An unsuccessful telephone outreach was attempted for a scheduled appointment today.  Follow Up Plan:  Additional outreach attempts will be made to offer the patient care coordination information and services.   Encounter Outcome:  No Answer  Care Coordination Interventions Activated:  No   Care Coordination Interventions:  No, not indicated    Barb Merino, RN, BSN, CCM Care Management Coordinator [facility] Care Management  Direct Phone: [phone]

## 2022-02-07 ENCOUNTER — Encounter (HOSPITAL_COMMUNITY): Payer: Self-pay | Admitting: Emergency Medicine

## 2022-02-07 ENCOUNTER — Ambulatory Visit (HOSPITAL_COMMUNITY)
Admission: EM | Admit: 2022-02-07 | Discharge: 2022-02-07 | Disposition: A | Discharge status: Home / Self Care | Payer: Medicare PPO | Attending: Emergency Medicine | Admitting: Emergency Medicine

## 2022-02-07 ENCOUNTER — Other Ambulatory Visit: Payer: Self-pay

## 2022-02-07 DIAGNOSIS — J029 Acute pharyngitis, unspecified: Secondary | ICD-10-CM | POA: Diagnosis not present

## 2022-02-07 DIAGNOSIS — J069 Acute upper respiratory infection, unspecified: Secondary | ICD-10-CM

## 2022-02-07 LAB — POCT RAPID STREP A, ED / UC: Streptococcus, Group A Screen (Direct): NEGATIVE

## 2022-02-07 MED ORDER — ACETAMINOPHEN 325 MG PO TABS: 650.0000 mg | ORAL_TABLET | Freq: Once | ORAL | Status: DC

## 2022-02-07 NOTE — ED Notes (Signed)
Pt left before Covid was obtain.  Attempted to reach patient by phone- no answer or voice mail.

## 2022-02-07 NOTE — ED Triage Notes (Signed)
2 days ago started with a sore throat, complains of sinus drainage in throat.  Unknown fever.  Has not had any medicines for symptoms

## 2022-02-07 NOTE — Discharge Instructions (Addendum)
Your strep test was negative.  You likely have a virus causing your symptoms. I recommend to alternate tylenol and ibuprofen every 6 hours for pain. Salt water gargles, throat lozenges or sprays. Try Mucinex twice daily for congestion/cough.  We will call you if your covid test returns positive.

## 2022-02-07 NOTE — ED Provider Notes (Signed)
[place]    CSN: [id number] Arrival date & time: 02/07/22  1242      History   Chief Complaint Chief Complaint  Patient presents with   Sore Throat    HPI Scott Farrell is a 41 y.o. male.   Presents with 2-day history of nasal congestion, sore throat, dry cough, body aches. Denies fever but reports chills this morning. Throat feels itchy and scratchy, minor pain with swallowing.  He wants to be tested for strep throat. Has not tried any medications for his symptoms.  He has been able to tolerate fluids by mouth and is eating normally.  No known sick contacts.  Past Medical History:  Diagnosis Date   Clotting disorder [facility])    personal hx/o? etiology unclear   Costochondritis    history of   Diverticulitis    Herpes genitalia 06/2010   History of echocardiogram 01/07/2011   normal LV function, EF 60%, trace mitral, tricuspid and pulmonic regurgitation; Dr. Tollie Eth   IBS (irritable bowel syndrome)    Mental disorder 2013   hospitalized for psychiatric illness prior   Normal cardiac stress test 01/07/2011   negative treadmill test, no evidence of ischemia, good exercise capacity; Tollie Eth, MD;    Prostatitis 11/2010   Dr. Karsten Ro   Renal stone 06/2012   Urology consult   Schizophrenia [facility])    [facility], [facility] - Dr. Lennon Alstrom   Tobacco use disorder     Patient Active Problem List   Diagnosis Date Noted   Lumbar radiculopathy 11/27/2021   Metatarsalgia of both feet 11/27/2021   Chest pain of uncertain etiology 40/98/1191   Tobacco abuse 09/19/2020   Prediabetes 09/13/2020   High triglycerides 09/13/2020   Other headache syndrome 07/31/2019   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/31/2019   Lightheaded 07/31/2019   Callus of foot 01/27/2018   Unspecified constipation 04/27/2012   Family history of malignant neoplasm of gastrointestinal tract 04/27/2012   Gastric mass 04/21/2012    Past Surgical History:  Procedure Laterality  Date   EUS  06/30/2012   Procedure: UPPER ENDOSCOPIC ULTRASOUND (EUS) LINEAR;  Surgeon: Milus Banister, MD;  Location: [facility];  Service: Endoscopy;  Laterality: N/A;  radial linear   WISDOM TOOTH EXTRACTION        Home Medications    Prior to Admission medications   Medication Sig Start Date End Date Taking? Authorizing Provider  ARIPiprazole (ABILIFY) 5 MG tablet Take 5 mg by mouth daily. Patient not taking: Reported on 12/15/2021 09/12/21   [provider]  dicyclomine (BENTYL) 20 MG tablet Take 1 tablet (20 mg total) by mouth 4 (four) times daily -  before meals and at bedtime. Patient not taking: Reported on 10/31/2021 09/25/21   Vladimir Crofts, PA-C  divalproex (DEPAKOTE) 500 MG DR tablet Take 500 mg by mouth at bedtime. 08/06/20   [provider]  naproxen (NAPROSYN) 500 MG tablet Take 1 tablet (500 mg total) by mouth 2 (two) times daily. 11/25/21   Fredia Sorrow, MD  nicotine (NICODERM CQ - DOSED IN MG/24 HOURS) 21 mg/24hr patch Place 1 patch (21 mg total) onto the skin daily. Patient not taking: Reported on 10/31/2021 08/11/21 08/11/22  [name], FNP  predniSONE (DELTASONE) 5 MG tablet Take 6 pills for first day, 5 pills second day, 4 pills third day, 3 pills fourth day, 2 pills the fifth day, and 1 pill sixth day. Patient not taking: Reported on 02/07/2022 11/27/21   Rosemarie Ax,  MD  risperidone (RISPERDAL) 4 MG tablet Take 4 mg by mouth 2 (two) times daily. 03/24/21   [provider]  senna-docusate (SENOKOT-S) 8.6-50 MG tablet Take 1 tablet by mouth daily. Patient not taking: Reported on 09/25/2021 05/15/21   Hans Eden, NP    Family History Family History  Problem Relation Age of Onset   Hypertension Mother    Stroke Mother    Depression Mother    Acne Mother    Thyroid disease Mother    Diabetes Mother    Colon cancer Mother 73   Acute lymphoblastic leukemia Father    Diabetes Father    Cancer Brother    Breast cancer  Maternal Aunt    Ovarian cancer Maternal Aunt    Esophageal cancer Neg Hx    Stomach cancer Neg Hx     Social History Social History   Tobacco Use   Smoking status: Some Days    Packs/day: 0.25    Years: 23.00    Total pack years: 5.75    Types: Cigars, Cigarettes    Passive exposure: Never   Smokeless tobacco: Never   Tobacco comments:    07/10/2021, smokes 4 [place] cigars daily  Vaping Use   Vaping Use: Some days   Substances: Nicotine  Substance Use Topics   Alcohol use: Yes    Comment: occ   Drug use: Yes    Types: Hydrocodone     Allergies   Metformin and related   Review of Systems Review of Systems Per HPI  Physical Exam Triage Vital Signs ED Triage Vitals  Enc Vitals Group     BP 02/07/22 1437 119/79     Pulse Rate 02/07/22 1437 (!) 55     Resp 02/07/22 1437 18     Temp 02/07/22 1437 98.2 F (36.8 C)     Temp Source 02/07/22 1437 Oral     SpO2 02/07/22 1437 95 %     Weight --      Height --      Head Circumference --      Peak Flow --      Pain Score 02/07/22 1434 5     Pain Loc --      Pain Edu? --      Excl. in [place]? --    No data found.  Updated Vital Signs BP 119/79 (BP Location: Left Arm) Comment (BP Location): large cuff  Pulse (!) 55   Temp 98.2 F (36.8 C) (Oral)   Resp 18   SpO2 95%     Physical Exam Vitals and nursing note reviewed.  Constitutional:      General: He is not in acute distress. HENT:     Nose: No congestion.     Mouth/Throat:     Mouth: Mucous membranes are moist.     Pharynx: Uvula midline. Posterior oropharyngeal erythema present.     Tonsils: No tonsillar exudate or tonsillar abscesses.  Eyes:     Conjunctiva/sclera: Conjunctivae normal.     Pupils: Pupils are equal, round, and reactive to light.  Cardiovascular:     Rate and Rhythm: Normal rate and regular rhythm.     Heart sounds: Normal heart sounds.  Pulmonary:     Effort: Pulmonary effort is normal.     Breath sounds: Normal breath sounds.   Musculoskeletal:     Cervical back: Normal range of motion.  Lymphadenopathy:     Cervical: No cervical adenopathy.  Neurological:     Mental  Status: He is alert and oriented to person, place, and time.      UC Treatments / Results  Labs (all labs ordered are listed, but only abnormal results are displayed) Labs Reviewed  SARS CORONAVIRUS 2 BY RT PCR  POCT RAPID STREP A, ED / UC    EKG   Radiology No results found.  Procedures Procedures (including critical care time)  Medications Ordered in UC Medications - No data to display   Initial Impression / Assessment and Plan / UC Course  I have reviewed the triage vital signs and the nursing notes.  Pertinent labs & imaging results that were available during my care of the patient were reviewed by me and considered in my medical decision making (see chart for details).  Well appearing, vitals stable Strep negative.  Likely viral etiology of symptoms.  COVID test pending.  Recommend symptomatic care at home, discussed medication use.  Return precautions were discussed and patient agrees to plan  Final Clinical Impressions(s) / UC Diagnoses   Final diagnoses:  Viral pharyngitis  Viral URI with cough     Discharge Instructions      Your strep test was negative.  You likely have a virus causing your symptoms. I recommend to alternate tylenol and ibuprofen every 6 hours for pain. Salt water gargles, throat lozenges or sprays. Try Mucinex twice daily for congestion/cough.  We will call you if your covid test returns positive.       ED Prescriptions   None    PDMP not reviewed this encounter.   Leigh Blas, Vernice [place] 02/07/22 1932

## 2022-02-09 DIAGNOSIS — F251 Schizoaffective disorder, depressive type: Secondary | ICD-10-CM | POA: Diagnosis not present

## 2022-02-23 ENCOUNTER — Telehealth: Payer: Self-pay

## 2022-02-23 NOTE — Patient Outreach (Signed)
  Care Coordination   02/23/2022 Name: Scott Farrell MRN: [id number] DOB: 03-07-1981   Care Coordination Outreach Attempts:  An unsuccessful telephone outreach was attempted today to offer the patient information about available care coordination services as a benefit of their health plan.   Follow Up Plan:  Additional outreach attempts will be made to offer the patient care coordination information and services.   Encounter Outcome:  No Answer  Care Coordination Interventions Activated:  No   Care Coordination Interventions:  No, not indicated    [name], BSW, CDP Social Worker, Certified Dementia Practitioner [facility] Care Management  Care Coordination [phone]

## 2022-02-25 ENCOUNTER — Other Ambulatory Visit: Payer: Self-pay

## 2022-02-25 ENCOUNTER — Ambulatory Visit: Payer: Self-pay

## 2022-02-25 ENCOUNTER — Telehealth: Payer: Self-pay

## 2022-02-25 DIAGNOSIS — R220 Localized swelling, mass and lump, head: Secondary | ICD-10-CM

## 2022-02-25 NOTE — Patient Outreach (Signed)
  Care Coordination   Follow Up Visit Note   02/25/2022 Name: Scott Farrell MRN: [id number] DOB: December 30, 1980  Scott Farrell is a 41 y.o. year old male who sees Scott Farrell, Scott Farrell for primary care. I spoke with  Scott Farrell by phone today.  What matters to the patients health and wellness today?  I would like to apply for Medicaid    Goals Addressed             This Visit's Progress    Care Coordination Activities       Care Coordination Interventions: Collaboration with RN Care Manager who requests SW outreach the patient to assist with a Medicaid application Discussed the patient has applied for Medicaid in the past but was over the income limit Education provided that the patient may still be over the income limit - patient still interested in applying Mailed the patient a Medicaid application advising to submit to DSS once completed Determined the patient is interested in Medicaid due to concerns with food insecurity and hopes he would receive more benefits Provided the patient with a list of food pantry sites as well as education on the "[place]" app Discussed the patient may be interested in working some to increase income but is not sure where to start Referral placed to vocational rehab to assist the patient with possible job placement        SDOH assessments and interventions completed:  No     Care Coordination Interventions Activated:  Yes  Care Coordination Interventions:  Yes, provided   Follow up plan: Follow up call scheduled for 10/11    Encounter Outcome:  Pt. Visit Completed   Scott Farrell, BSW, CDP Social Worker, Certified Dementia Practitioner [facility]  Care Coordination [phone]

## 2022-02-25 NOTE — Patient Instructions (Signed)
Visit Information  Thank you for taking time to visit with me today. Please don't hesitate to contact me if I can be of assistance to you.   Following are the goals we discussed today:   Goals Addressed             This Visit's Progress    Care Coordination Activities       Care Coordination Interventions: Collaboration with RN Care Manager who requests SW outreach the patient to assist with a Medicaid application Discussed the patient has applied for Medicaid in the past but was over the income limit Education provided that the patient may still be over the income limit - patient still interested in applying Mailed the patient a Medicaid application advising to submit to DSS once completed Determined the patient is interested in Medicaid due to concerns with food insecurity and hopes he would receive more benefits Provided the patient with a list of food pantry sites as well as education on the "[place]" app Discussed the patient may be interested in working some to increase income but is not sure where to start Referral placed to vocational rehab to assist the patient with possible job placement        Our next appointment is by telephone on 10/11 at 2:00  Please call the care guide team at [phone] if you need to cancel or reschedule your appointment.   If you are experiencing a Mental Health or [place] or need someone to talk to, please call 1-800-273-TALK (toll free, 24 hour hotline)  Patient verbalizes understanding of instructions and care plan provided today and agrees to view in [place]. Active MyChart status and patient understanding of how to access instructions and care plan via MyChart confirmed with patient.     Telephone follow up appointment with care management team member scheduled for:10/11  [name], BSW, CDP Social Worker, Certified Dementia Practitioner [place]  Care Coordination [phone]

## 2022-02-25 NOTE — Patient Outreach (Signed)
  Care Coordination   02/25/2022 Name: LADARIEN BEEKS MRN: [id number] DOB: [birth year]   Care Coordination Outreach Attempts:  A second unsuccessful outreach was attempted today to offer the patient with information about available care coordination services as a benefit of their health plan.     Follow Up Plan:  Additional outreach attempts will be made to offer the patient care coordination information and services.   Encounter Outcome:  No Answer  Care Coordination Interventions Activated:  No   Care Coordination Interventions:  No, not indicated    [name], BSW, CDP Social Worker, Certified Dementia Practitioner [facility] Care Management  Care Coordination [phone]

## 2022-02-27 ENCOUNTER — Telehealth: Payer: Medicare PPO | Admitting: Physician Assistant

## 2022-02-27 DIAGNOSIS — S61411A Laceration without foreign body of right hand, initial encounter: Secondary | ICD-10-CM

## 2022-02-27 MED ORDER — IBUPROFEN 800 MG PO TABS: 800.0000 mg | ORAL_TABLET | Freq: Three times a day (TID) | ORAL | 0 refills | Status: DC | PRN

## 2022-02-27 NOTE — Progress Notes (Signed)
Virtual Visit Consent   Scott Farrell, you are scheduled for a virtual visit with a [facility] provider today. Just as with appointments in the office, your consent must be obtained to participate. Your consent will be active for this visit and any virtual visit you may have with one of our providers in the next 365 days. If you have a MyChart account, a copy of this consent can be sent to you electronically.  As this is a virtual visit, video technology does not allow for your provider to perform a traditional examination. This may limit your provider's ability to fully assess your condition. If your provider identifies any concerns that need to be evaluated in person or the need to arrange testing (such as labs, EKG, etc.), we will make arrangements to do so. Although advances in technology are sophisticated, we cannot ensure that it will always work on either your end or our end. If the connection with a video visit is poor, the visit may have to be switched to a telephone visit. With either a video or telephone visit, we are not always able to ensure that we have a secure connection.  By engaging in this virtual visit, you consent to the provision of healthcare and authorize for your insurance to be billed (if applicable) for the services provided during this visit. Depending on your insurance coverage, you may receive a charge related to this service.  I need to obtain your verbal consent now. Are you willing to proceed with your visit today? Scott Farrell has provided verbal consent on 02/27/2022 for a virtual visit (video or telephone). Mar Daring, PA-C  Date: 02/27/2022 1:56 PM  Virtual Visit via Video Note   IMar Daring, connected with  Scott Farrell  ([id number], 1981/05/16) on 02/27/22 at  2:15 PM EDT by a video-enabled telemedicine application and verified that I am speaking with the correct person using two identifiers.  Location: Patient: Virtual  Visit Location Patient: Home Provider: Virtual Visit Location Provider: Home Office   I discussed the limitations of evaluation and management by telemedicine and the availability of in person appointments. The patient expressed understanding and agreed to proceed.    History of Present Illness: Scott Farrell is a 41 y.o. who identifies as a male who was assigned male at birth, and is being seen today for laceration to right 2nd finger over the MCP joint on the dorsal aspect of the hand. The incident happened last night while he was washing dishes. He is able to still move the 2nd finger and make a fist, but it is sore. There are nor foreign bodies present in wound. No signs of infection at this time. He has been cleaning the wound with hydrogen peroxide.    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 (Patient not taking: Reported on 12/15/2021), Disp: , Rfl:    dicyclomine (BENTYL) 20 MG tablet, Take 1 tablet (20 mg total) by mouth 4 (four) times daily -  before meals and at bedtime. (Patient not taking: Reported on 10/31/2021), Disp: 60 tablet, Rfl: 0    divalproex (DEPAKOTE) 500 MG DR tablet, Take 500 mg by mouth at bedtime., Disp: , Rfl:    naproxen (NAPROSYN) 500 MG tablet, Take 1 tablet (500 mg total) by mouth 2 (two) times daily., Disp: 14 tablet, Rfl: 0   nicotine (NICODERM CQ - DOSED IN MG/24 HOURS) 21 mg/24hr patch, Place 1 patch (21 mg total) onto the skin daily. (Patient not taking: Reported on 10/31/2021), Disp: 30 patch, Rfl: 1   predniSONE (DELTASONE) 5 MG tablet, Take 6 pills for first day, 5 pills second day, 4 pills third day, 3 pills fourth day, 2 pills the fifth day, and 1 pill sixth day. (Patient not taking: Reported on 02/07/2022), Disp: 21 tablet, Rfl: 0   risperidone (RISPERDAL) 4 MG tablet, Take 4 mg by mouth 2 (two) times daily., Disp: , Rfl:    senna-docusate (SENOKOT-S) 8.6-50 MG tablet, Take 1 tablet by mouth daily. (Patient not taking: Reported on 09/25/2021), Disp: 30 tablet, Rfl: 0  Observations/Objective: Patient is well-developed, well-nourished in no acute distress.  Resting comfortably at home.  Head is normocephalic, atraumatic.  No labored breathing.  Speech is clear and coherent with logical content.  Patient is alert and oriented at baseline.  Clean laceration with jagged edge noted on the MCP joint of the 2nd finger on the right hand. Wound bed is clean with bright red tissue present. No active bleeding or discharge noted at this time. No surrounding erythema.   Assessment and Plan: 1. Laceration of right hand without foreign body, initial encounter - ibuprofen (ADVIL) 800 MG tablet; Take 1 tablet (800 mg total) by mouth every 8 (eight) hours as needed.  Dispense: 30 tablet; Refill: 0  - Do not think stitches are required, also stitch would be in an area where it would be a risk for continual tearing of skin or popping of the stitch due to the location. - Discussed getting NuSkin or Dermabond glue to put over the wound to keep from reopening when he uses his hands - Keep area clean and dry; wash with soap and  water - Limit hydrogen peroxide use at this time - Discussed may keep covered to avoid hitting and aggravating wound, delaying healing - Seek in person evaluation if signs or symptoms of infection develop or loss of motion in the right 2nd finger develop  Follow Up Instructions: I discussed the assessment and treatment plan with the patient. The patient was provided an opportunity to ask questions and all were answered. The patient agreed with the plan and demonstrated an understanding of the instructions.  A copy of instructions were sent to the patient via MyChart unless otherwise noted below.    The patient was advised to call back or seek an in-person evaluation if the symptoms worsen or if the condition fails to improve as anticipated.  Time:  I spent 10 minutes with the patient via telehealth technology discussing the above problems/concerns.    Mar Daring, PA-C

## 2022-02-27 NOTE — Patient Instructions (Signed)
Scott Farrell, thank you for joining Mar Daring, PA-C for today's virtual visit.  While this provider is not your primary care provider (PCP), if your PCP is located in our provider database this encounter information will be shared with them immediately following your visit.  Consent: (Patient) Scott Farrell provided verbal consent for this virtual visit at the beginning of the encounter.  Current Medications:  Current Outpatient Medications:    ibuprofen (ADVIL) 800 MG tablet, Take 1 tablet (800 mg total) by mouth every 8 (eight) hours as needed., Disp: 30 tablet, Rfl: 0   ARIPiprazole (ABILIFY) 5 MG tablet, Take 5 mg by mouth daily. (Patient not taking: Reported on 12/15/2021), Disp: , Rfl:    dicyclomine (BENTYL) 20 MG tablet, Take 1 tablet (20 mg total) by mouth 4 (four) times daily -  before meals and at bedtime. (Patient not taking: Reported on 10/31/2021), Disp: 60 tablet, Rfl: 0   divalproex (DEPAKOTE) 500 MG DR tablet, Take 500 mg by mouth at bedtime., Disp: , Rfl:    naproxen (NAPROSYN) 500 MG tablet, Take 1 tablet (500 mg total) by mouth 2 (two) times daily., Disp: 14 tablet, Rfl: 0   nicotine (NICODERM CQ - DOSED IN MG/24 HOURS) 21 mg/24hr patch, Place 1 patch (21 mg total) onto the skin daily. (Patient not taking: Reported on 10/31/2021), Disp: 30 patch, Rfl: 1   predniSONE (DELTASONE) 5 MG tablet, Take 6 pills for first day, 5 pills second day, 4 pills third day, 3 pills fourth day, 2 pills the fifth day, and 1 pill sixth day. (Patient not taking: Reported on 02/07/2022), Disp: 21 tablet, Rfl: 0   risperidone (RISPERDAL) 4 MG tablet, Take 4 mg by mouth 2 (two) times daily., Disp: , Rfl:    senna-docusate (SENOKOT-S) 8.6-50 MG tablet, Take 1 tablet by mouth daily. (Patient not taking: Reported on 09/25/2021), Disp: 30 tablet, Rfl: 0   Medications ordered in this encounter:  Meds ordered this encounter  Medications   ibuprofen (ADVIL) 800 MG tablet    Sig: Take 1  tablet (800 mg total) by mouth every 8 (eight) hours as needed.    Dispense:  30 tablet    Refill:  0    Order Specific Question:   Supervising Provider    Answer:   Chase Picket [id number]     *If you need refills on other medications prior to your next appointment, please contact your pharmacy*  Follow-Up: Call back or seek an in-person evaluation if the symptoms worsen or if the condition fails to improve as anticipated.  [place] [phone]  Other Instructions Laceration Care, Adult A laceration is a cut that may go through all layers of the skin and into the tissue that is right under the skin. Some lacerations heal on their own. Others need to be closed with stitches (sutures), staples, skin adhesive strips, or skin glue. Proper care of a laceration reduces the risk for infection, helps the laceration heal better, and may prevent scarring. General tips Keep the wound clean and dry. Do not scratch or pick at the wound. Wash your hands with soap and water for at least 20 seconds before and after touching your wound or changing your bandage (dressing). If soap and water are not available, use hand sanitizer. Do not usedisinfectants or antiseptics, such as rubbing alcohol, to clean your wound unless told by your health care provider. If you were given a dressing, you should change it at least  once a day, or as told by your health care provider. You should also change it if it becomes wet or dirty. How to care for your laceration If sutures or staples were used: Keep the wound completely dry for the first 24 hours, or as told by your health care provider. After that time, you may shower or bathe. Do not soak your wound in water until after the sutures or staples have been removed. Clean the wound once each day, or as told by your health care provider. To do this: Wash the wound with soap and water. Rinse the wound with water to remove all soap. Pat the wound dry  with a clean towel. Do not rub the wound. After cleaning the wound, apply a thin layer of antibiotic ointment, other topical ointments, or a non-adherent dressing as told by your health care provider. This will help prevent infection and keep the dressing from sticking to the wound. Have the sutures or staples removed as told by your health care provider. Do not  remove sutures or staples yourself. If skin adhesive strips were used: Do not get the skin adhesive strips wet. You may shower or bathe, but keep the wound dry. If the wound gets wet, pat it dry with a clean towel. Do not rub the wound. Skin adhesive strips fall off on their own. If adhesive strip edges start to loosen and curl up, you may trim the loose edges. Do not remove adhesive strips completely unless your health care provider tells you to do that. If skin glue was used: You may shower or bathe, but try to keep the wound dry. Do not soak the wound in water. After showering or bathing, pat the wound dry with a clean towel. Do not rub the wound. Do not do any activities that will make you sweat a lot until the skin glue has fallen off. Do not apply liquid, cream, or ointment medicine to the wound while the skin glue is in place. Doing this may loosen the film before the wound has healed. If a dressing is placed over the wound, do not apply tape directly over the skin glue. Doing this may cause the glue to be pulled off before the wound has healed. Do not pick at the glue. Skin glue usually remains in place for 5-10 days and then falls off the skin. Follow these instructions at home: Medicines Take over-the-counter and prescription medicines only as told by your health care provider. If you were prescribed an antibiotic medicine or ointment, take or apply it as told by your health care provider. Do not stop using it even if your condition improves. Managing pain and swelling If directed, put ice on the injured area. To do this: Put  ice in a plastic bag. Place a towel between your skin and the bag. Leave the ice on for 20 minutes, 2-3 times a day. Remove the ice if your skin turns bright red. This is very important. If you cannot feel pain, heat, or cold, you have a greater risk of damage to the area. Raise (elevate) the injured area above the level of your heart while you are sitting or lying down for the first 24-48 hours after the laceration is repaired. General instructions  Avoid any activity that could cause your wound to reopen. Check your wound every day for signs of infection. Watch for: More redness, swelling, or pain. Fluid or blood. Warmth. Pus or a bad smell. Keep all follow-up visits. This is  important. Contact a health care provider if: You received a tetanus shot and you have swelling, severe pain, redness, or bleeding at the injection site. Your closed wound breaks open. You have any of these signs of infection: More redness, swelling, or pain around your wound. Fluid or blood coming from your wound. Warmth coming from your wound. Pus or a bad smell coming from your wound. A fever. You notice something coming out of the wound, such as wood or glass. Your pain is not controlled with medicine. You notice a change in the color of your skin near your wound. You need to change the dressing often. You develop a new rash. You have numbness around the wound. Get help right away if: You develop severe swelling around the wound. Your pain suddenly increases and is severe. You develop painful lumps near the wound or on skin anywhere else on your body. You have a red streak going away from your wound. The wound is on your hand or foot, and you cannot properly move a finger or toe. The wound is on your hand or foot, and you notice that your fingers or toes look pale or bluish. Summary A laceration is a cut that may go through all layers of the skin and into the tissue that is right under the skin. Some  lacerations heal on their own. Others need to be closed with stitches (sutures), staples, skin adhesive strips, or skin glue. Proper care of a laceration reduces the risk of infection, helps the laceration heal better, and may prevent scarring. This information is not intended to replace advice given to you by your health care provider. Make sure you discuss any questions you have with your health care provider. Document Revised: 07/25/2020 Document Reviewed: 07/25/2020 Elsevier Patient Education  [place].    If you have been instructed to have an in-person evaluation today at a local Urgent Care facility, please use the link below. It will take you to a list of all of our available [facility], including address, phone number and hours of operation. Please do not delay care.  [facility]  If you or a family member do not have a primary care provider, use the link below to schedule a visit and establish care. When you choose a [facility] primary care physician or advanced practice provider, you gain a long-term partner in health. Find a Primary Care Provider  Learn more about [facility]'s in-office and virtual care options: [facility]

## 2022-03-11 ENCOUNTER — Ambulatory Visit: Payer: Self-pay

## 2022-03-11 NOTE — Patient Outreach (Signed)
  Care Coordination   Follow Up Visit Note   03/11/2022 Name: KAYO ZION MRN: [id number] DOB: 1980/06/07  [name] is a 41 y.o. year old male who sees [name], [place] for primary care. I spoke with  Marston C Rosendahl by phone today.  What matters to the patients health and wellness today?  To find a job    Goals Addressed             This [place] Interventions: Confirmed receipt of mailed food resources - patient indicates he did not receive Medicaid application Medicaid application send via mail to patients home address Reviewed [place] to note referral to vocational rehab has not yet been acted on Determined the patient is still interested in job placement Discussed plans for SW to contact [place] by phone to complete referral Successfully referred the patient to vocational rehab for job placement - resource will contact patient directly SW to follow up with the patient over the next 3 weeks to confirm receipt of mailed resource        SDOH assessments and interventions completed:  No     Care Coordination Interventions Activated:  Yes  Care Coordination Interventions:  Yes, provided   Follow up plan:  SW will continue to follow    Encounter Outcome:  Pt. Visit Completed   [name], Arita Miss, CDP Social Worker, Certified Dementia Practitioner [facility] Care Management  Care Coordination [phone]

## 2022-03-11 NOTE — Patient Instructions (Signed)
Visit Information  Thank you for taking time to visit with me today. Please don't hesitate to contact me if I can be of assistance to you.   Following are the goals we discussed today:   Goals Addressed             This Visit's Progress    Care Coordination Activities   On track    Care Coordination Interventions: Confirmed receipt of mailed food resources - patient indicates he did not receive Medicaid application Medicaid application send via mail to patients home address Reviewed [facility] to note referral to vocational rehab has not yet been acted on Determined the patient is still interested in job placement Discussed plans for SW to contact Vocational Rehab by phone to complete referral Successfully referred the patient to vocational rehab for job placement - resource will contact patient directly SW to follow up with the patient over the next 3 weeks to confirm receipt of mailed resource        Our next appointment is by telephone on 04/01/22 at 11:00 am  Please call the care guide team at [phone] if you need to cancel or reschedule your appointment.   If you are experiencing a Mental Health or [place] or need someone to talk to, please go to [facility] [address] [place] [phone])  Patient verbalizes understanding of instructions and care plan provided today and agrees to view in [place]. Active MyChart status and patient understanding of how to access instructions and care plan via MyChart confirmed with patient.     Telephone follow up appointment with care management team member scheduled for:11/1   [name], BSW, CDP Social Worker, Certified Dementia Practitioner [facility]  Care Coordination [phone]

## 2022-03-25 ENCOUNTER — Encounter: Payer: Medicare PPO | Admitting: Nurse Practitioner

## 2022-04-01 ENCOUNTER — Ambulatory Visit: Payer: Self-pay

## 2022-04-01 NOTE — Patient Outreach (Signed)
  Care Coordination   04/01/2022 Name: Scott Farrell MRN: [id number] DOB: [birth year]   Care Coordination Outreach Attempts:  An unsuccessful telephone outreach was attempted for a scheduled appointment today.  Follow Up Plan:  No further outreach attempts will be made at this time. We have been unable to contact the patient to offer or enroll patient in care coordination services  Encounter Outcome:  No Answer  Care Coordination Interventions Activated:  No   Care Coordination Interventions:  No, not indicated    Barb Merino, RN, BSN, CCM Care Management Coordinator [facility] Management  Direct Phone: [phone]

## 2022-04-01 NOTE — Patient Outreach (Signed)
  Care Coordination   Follow Up Visit Note   04/01/2022 Name: JAECEON MICHELIN MRN: [id number] DOB: 10/05/1980  [name] is a 41 y.o. year old male who sees [name], [place] for primary care. I spoke with  Divit C Zorn by phone today.  What matters to the patients health and wellness today?  To obtain a job    Goals Addressed             This Visit's Progress    COMPLETED: Care Coordination Activities       Care Coordination Interventions: Confirmed receipt of Medicaid application - patient indicates he is unsure how to submit Advised patient he may submit by mail or in person to [place] Discussed if patient has barriers with submitting his application he may contact DSS by phone to complete the application with a caseworker by phone Determined the patient has been in contact with Vocational Rehab and must choose between vocational rehab and ticket to work which he is currently enrolled in Patient did have an interview today through ticket to work No other SW needs identified; encouraged the patient to contact SW as needed        SDOH assessments and interventions completed:  No     Care Coordination Interventions Activated:  Yes  Care Coordination Interventions:  Yes, provided   Follow up plan: No further intervention required.   Encounter Outcome:  Pt. Visit Completed   [name], BSW, CDP Social Worker, Certified Dementia Practitioner [facility] Management  Care Coordination [phone]

## 2022-04-01 NOTE — Patient Instructions (Signed)
Visit Information  Thank you for taking time to visit with me today. Please don't hesitate to contact me if I can be of assistance to you.   Following are the goals we discussed today:   Goals Addressed             This Visit's Progress    COMPLETED: Care Coordination Activities       Care Coordination Interventions: Confirmed receipt of Medicaid application - patient indicates he is unsure how to submit Advised patient he may submit by mail or in person to DSS Discussed if patient has barriers with submitting his application he may contact DSS by phone to complete the application with a caseworker by phone Determined the patient has been in contact with Vocational Rehab and must choose between vocational rehab and ticket to work which he is currently enrolled in Patient did have an interview today through ticket to work No other SW needs identified; encouraged the patient to contact SW as needed        If you are experiencing a Mental Health or [place] or need someone to talk to, please go to [facility] [address], [place] [phone])  Patient verbalizes understanding of instructions and care plan provided today and agrees to view in [place]. Active MyChart status and patient understanding of how to access instructions and care plan via MyChart confirmed with patient.     No further follow up required: Please contact your primary care provider as needed  [name], Arita Miss, CDP Social Worker, Certified Dementia Practitioner [place] [facility] [phone]

## 2022-05-26 ENCOUNTER — Encounter: Payer: Self-pay | Admitting: Gastroenterology

## 2022-06-03 ENCOUNTER — Encounter (HOSPITAL_COMMUNITY): Payer: Self-pay | Admitting: Emergency Medicine

## 2022-06-03 ENCOUNTER — Ambulatory Visit (HOSPITAL_COMMUNITY)
Admission: EM | Admit: 2022-06-03 | Discharge: 2022-06-03 | Disposition: A | Discharge status: Home / Self Care | Payer: Medicare PPO | Attending: Emergency Medicine | Admitting: Emergency Medicine

## 2022-06-03 DIAGNOSIS — M25562 Pain in left knee: Secondary | ICD-10-CM

## 2022-06-03 NOTE — ED Provider Notes (Signed)
[facility]    CSN: [id number] Arrival date & time: 06/03/22  1519     History   Chief Complaint Chief Complaint  Patient presents with   Knee Pain    HPI Scott Farrell is a 42 y.o. male.  Presents with 2 week history of left knee pain Intermittent. Worse with bending. Reports 4/10 pain today Denies falls, injury, or trauma  Tried ibu 800 mg once or twice last week that helped  Denies history of knee injury  Past Medical History:  Diagnosis Date   Clotting disorder ([place])    personal hx/o? etiology unclear   Costochondritis    history of   Diverticulitis    Herpes genitalia 06/2010   History of echocardiogram 01/07/2011   normal LV function, EF 60%, trace mitral, tricuspid and pulmonic regurgitation; Dr. Tollie Eth   IBS (irritable bowel syndrome)    Mental disorder 2013   hospitalized for psychiatric illness prior   Normal cardiac stress test 01/07/2011   negative treadmill test, no evidence of ischemia, good exercise capacity; Tollie Eth, MD;    Prostatitis 11/2010   Dr. Karsten Ro   Renal stone 06/2012   Urology consult   Schizophrenia [facility])    [facility], [facility] - Dr. 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 Authorizing Provider  ARIPiprazole (ABILIFY) 5 MG tablet Take 5 mg by mouth daily. Patient not taking: Reported on 12/15/2021 09/12/21   [provider]  dicyclomine (BENTYL) 20 MG tablet Take 1 tablet (20 mg total) by mouth 4 (four) times daily -  before meals and at bedtime. Patient not taking: Reported on 10/31/2021 09/25/21   Vladimir Crofts, PA-C  divalproex (DEPAKOTE) 500 MG DR tablet Take 500 mg by mouth at bedtime. 08/06/20   [provider]  ibuprofen (ADVIL) 800 MG tablet Take 1 tablet (800 mg total) by mouth every 8 (eight) hours as needed. 02/27/22   Mar Daring, PA-C  nicotine (NICODERM CQ - DOSED IN MG/24 HOURS) 21 mg/24hr patch Place 1 patch (21 mg total) onto the skin daily. Patient not taking: Reported on 10/31/2021 08/11/21 08/11/22  [name], FNP  predniSONE (DELTASONE) 5 MG tablet Take 6 pills for first day, 5 pills second day, 4 pills third day, 3 pills fourth day, 2 pills the fifth day, and 1 pill sixth day. Patient not taking: Reported on 02/07/2022 11/27/21   Rosemarie Ax, MD  risperidone (RISPERDAL) 4 MG tablet Take 4 mg by mouth 2 (two) times daily. 03/24/21   [provider]  senna-docusate (SENOKOT-S) 8.6-50  MG tablet Take 1 tablet by mouth daily. Patient not taking: Reported on 09/25/2021 05/15/21   Hans Eden, NP    Family History Family History  Problem Relation Age of Onset   Hypertension Mother    Stroke Mother    Depression Mother    Acne Mother    Thyroid disease Mother    Diabetes Mother    Colon cancer Mother 64   Acute lymphoblastic leukemia Father    Diabetes Father    Cancer Brother    Breast cancer Maternal Aunt    Ovarian cancer Maternal Aunt    Esophageal cancer Neg Hx    Stomach cancer Neg Hx     Social History Social History    Tobacco Use   Smoking status: Some Days    Packs/day: 0.25    Years: 23.00    Total pack years: 5.75    Types: Cigars, Cigarettes    Passive exposure: Never   Smokeless tobacco: Never   Tobacco comments:    07/10/2021, smokes 4 [place] cigars daily  Vaping Use   Vaping Use: Some days   Substances: Nicotine  Substance Use Topics   Alcohol use: Yes    Comment: occ   Drug use: Yes    Types: Hydrocodone     Allergies   Metformin and related   Review of Systems Review of Systems As per HPI  Physical Exam Triage Vital Signs ED Triage Vitals  Enc Vitals Group     BP 06/03/22 1648 137/82     Pulse Rate 06/03/22 1648 60     Resp 06/03/22 1648 14     Temp 06/03/22 1648 97.6 F (36.4 C)     Temp Source 06/03/22 1648 Oral     SpO2 06/03/22 1648 97 %     Weight --      Height --      Head Circumference --      Peak Flow --      Pain Score 06/03/22 1647 4     Pain Loc --      Pain Edu? --      Excl. in [place]? --    No data found.  Updated Vital Signs BP 137/82 (BP Location: Left Arm)   Pulse 60   Temp 97.6 F (36.4 C) (Oral)   Resp 14   SpO2 97%    Physical Exam Vitals and nursing note reviewed.  Constitutional:      General: He is not in acute distress. HENT:     Mouth/Throat:     Pharynx: Oropharynx is clear.  Cardiovascular:     Rate and Rhythm: Normal rate and regular rhythm.  Pulmonary:     Effort: Pulmonary effort is normal.  Musculoskeletal:        General: No swelling, tenderness, deformity or signs of injury. Normal range of motion.     Comments: Full ROM left knee. Non tender to palpation. No swelling or deformity. Distal sensation intact. No instability   Skin:    General: Skin is warm and dry.     Findings: No bruising, erythema or lesion.  Neurological:     Mental Status: He is alert and oriented to person, place, and time.    UC Treatments / Results  Labs (all labs ordered are listed, but only abnormal results are displayed) Labs  Reviewed - No data to display  EKG  Radiology No results found.  Procedures Procedures   Medications Ordered in UC Medications - No data  to display  Initial Impression / Assessment and Plan / UC Course  I have reviewed the triage vital signs and the nursing notes.  Pertinent labs & imaging results that were available during my care of the patient were reviewed by me and considered in my medical decision making (see chart for details).  Discussed RICE therapy Defer xray imaging today - not warranted without injury, and knee exam non tender. Recommend ibu if needed for pain/inflammation Can follow with ortho if needed Return precautions discussed. Patient agrees to plan   Patient was asking about antibiotics. Discussed no sign of infection at this time.  Final Clinical Impressions(s) / UC Diagnoses   Final diagnoses:  Acute pain of left knee     Discharge Instructions      Rest - try to avoid heavy lifting and high impact activity Ice - apply for 20 minutes a few times daily Compression - use knee wrap as needed for standing/walking Elevation - prop up on a pillow  Ibuprofen can be used up to 3x daily for inflammation and pain  Please follow-up with the knee specialist if symptoms persist despite these therapies.    ED Prescriptions   None    PDMP not reviewed this encounter.   Kyra Leyland 06/03/22 3646

## 2022-06-03 NOTE — Discharge Instructions (Addendum)
Rest - try to avoid heavy lifting and high impact activity Ice - apply for 20 minutes a few times daily Compression - use knee wrap as needed for standing/walking Elevation - prop up on a pillow  Ibuprofen can be used up to 3x daily for inflammation and pain  Please follow-up with the knee specialist if symptoms persist despite these therapies.

## 2022-06-03 NOTE — ED Triage Notes (Signed)
Pt reports left knee pain since 12/20. Denies falls or injury. Took ibuprofen '800mg'$ . Reports hx tendonitis or bursitis.

## 2022-06-20 IMAGING — US US RENAL
1 series · 14 of 25 positions shown · non-contrast
Comparison: CT abdomen pelvis 03/29/2014

CLINICAL DATA: Right flank pain

EXAM:
RENAL / URINARY TRACT ULTRASOUND COMPLETE

[Series 1: us renal · 0.26mm/px · 14 of 55 slices shown]
[im 1/55]
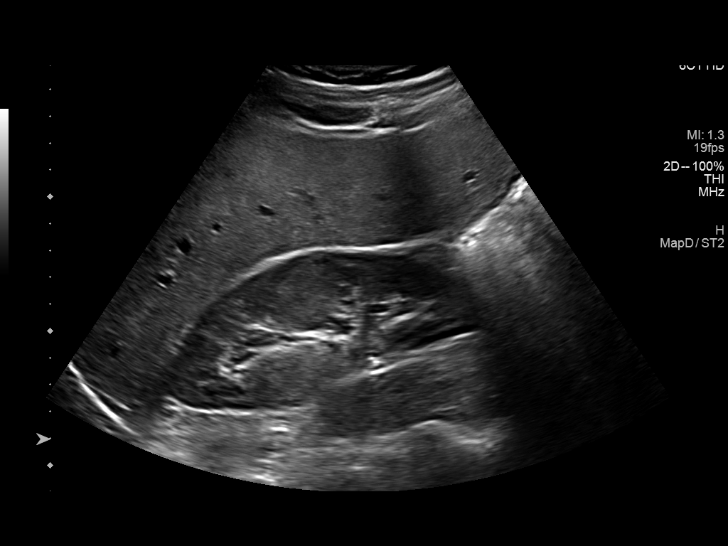
[im 5/55]
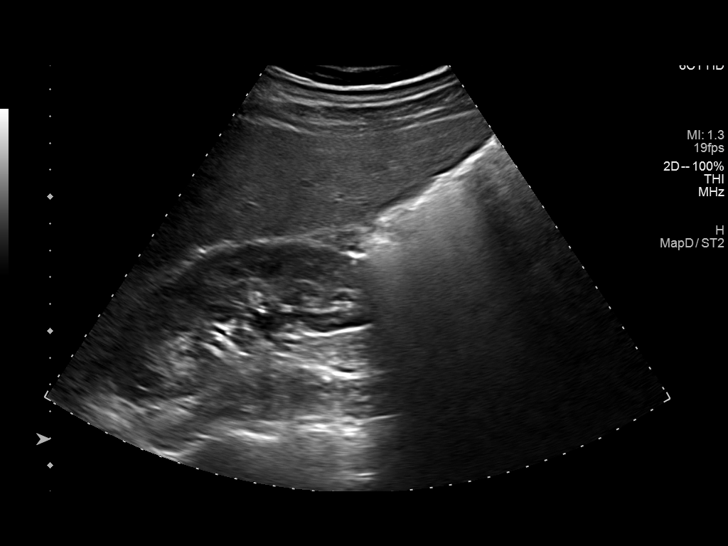
[im 10/55]
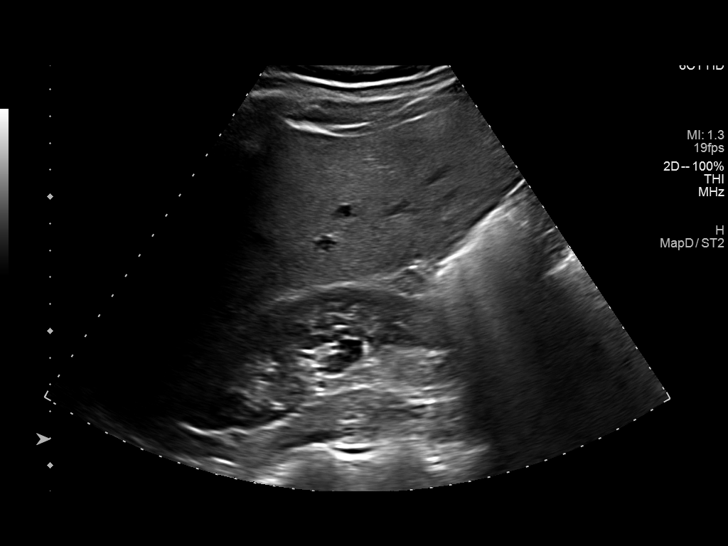
[im 14/55]
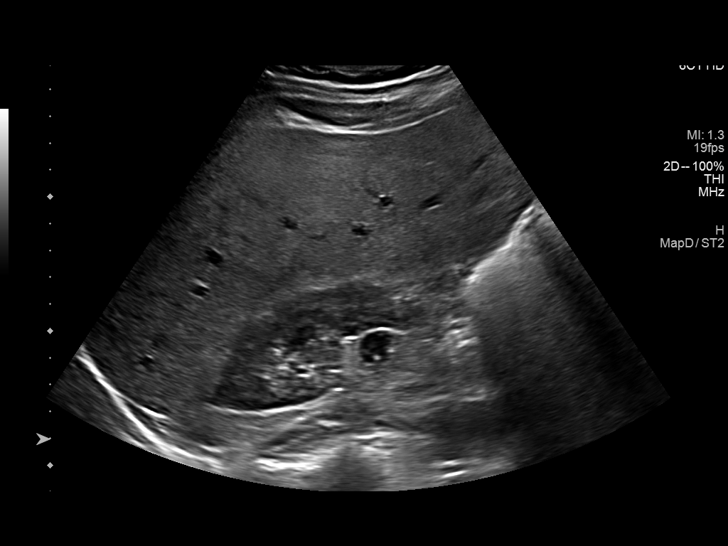
[im 19/55]
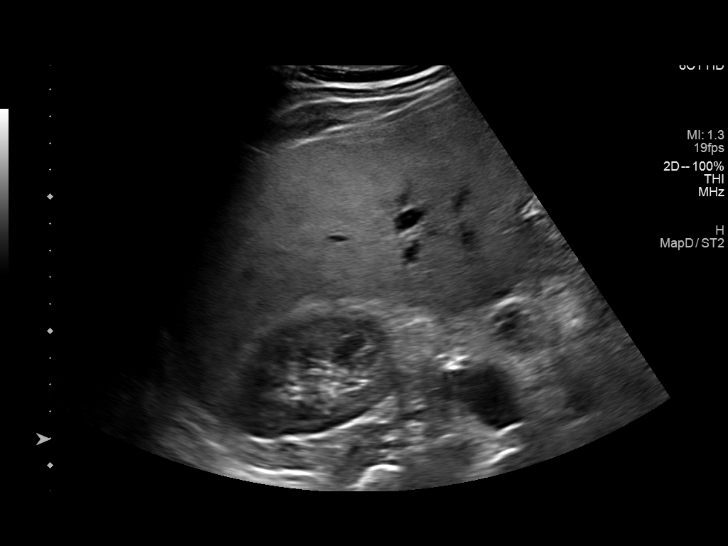
[im 21/55]
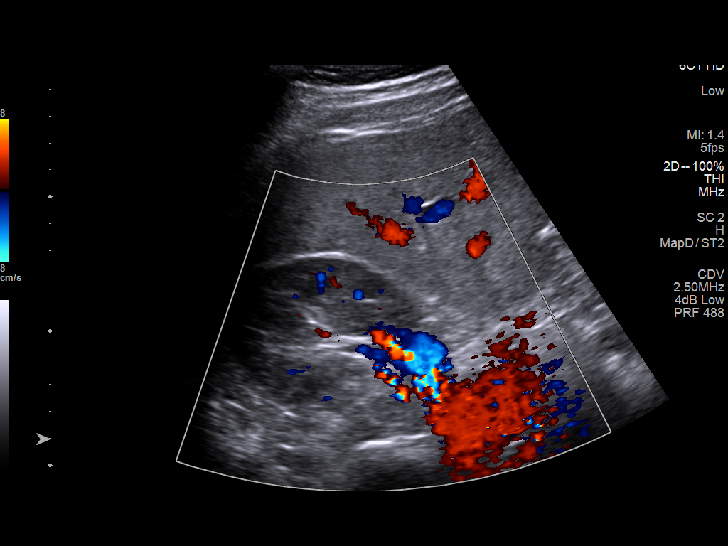
[im 25/55]
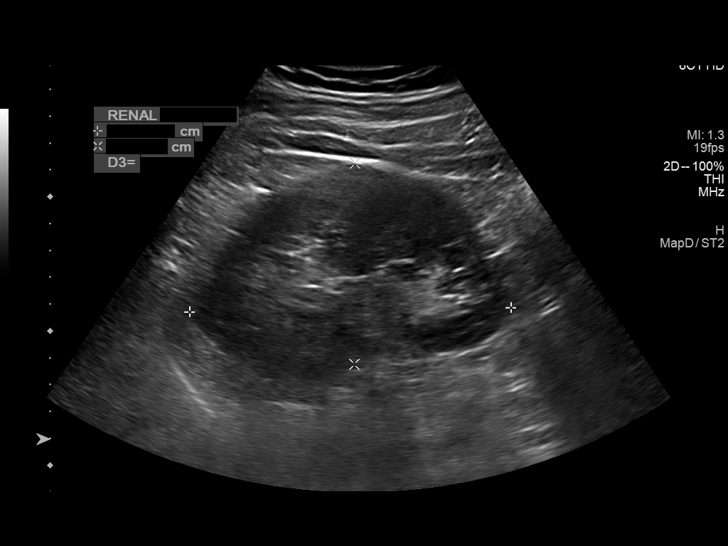
[im 30/55]
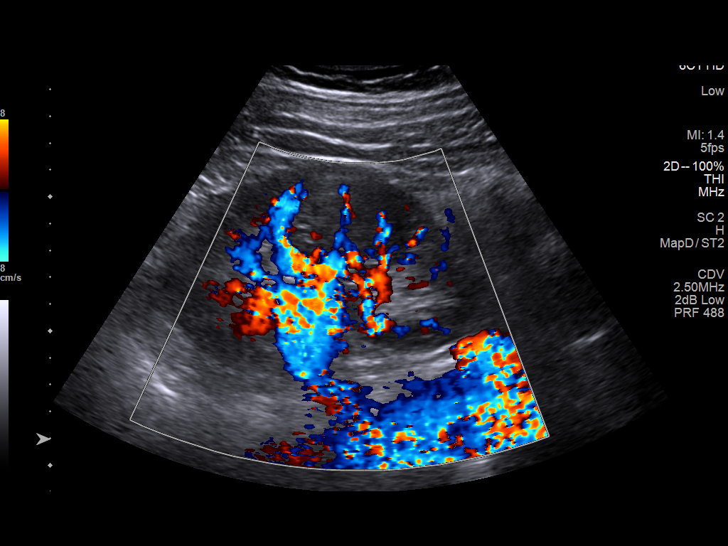
[im 34/55]
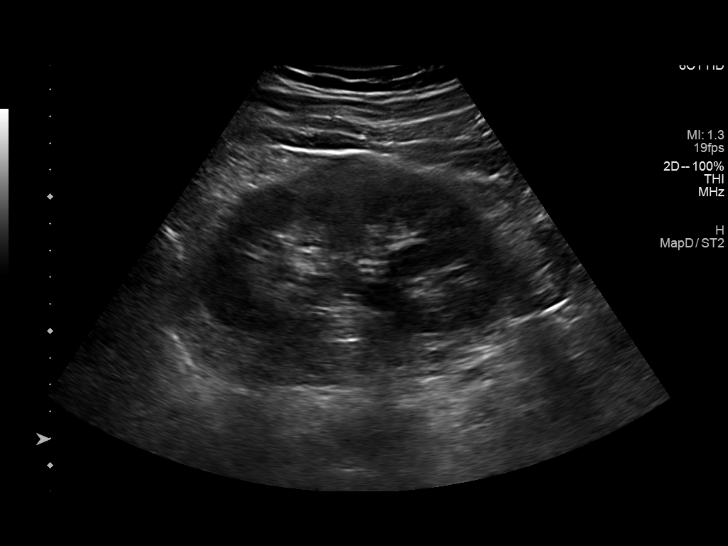
[im 37/55]
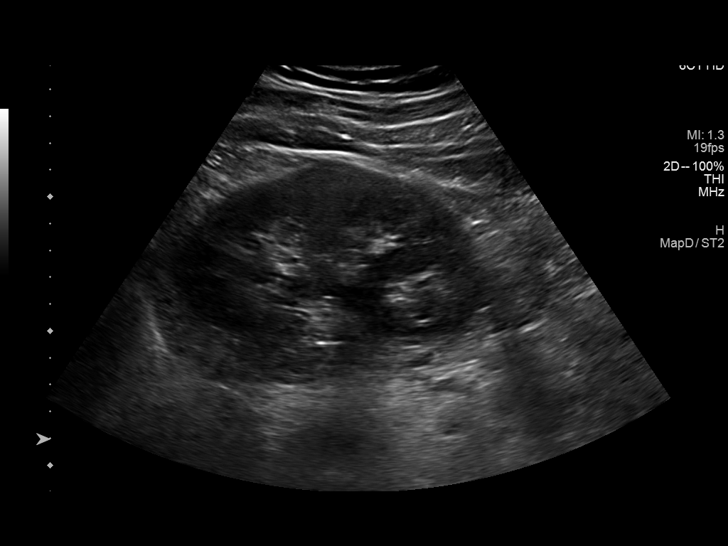
[im 41/55]
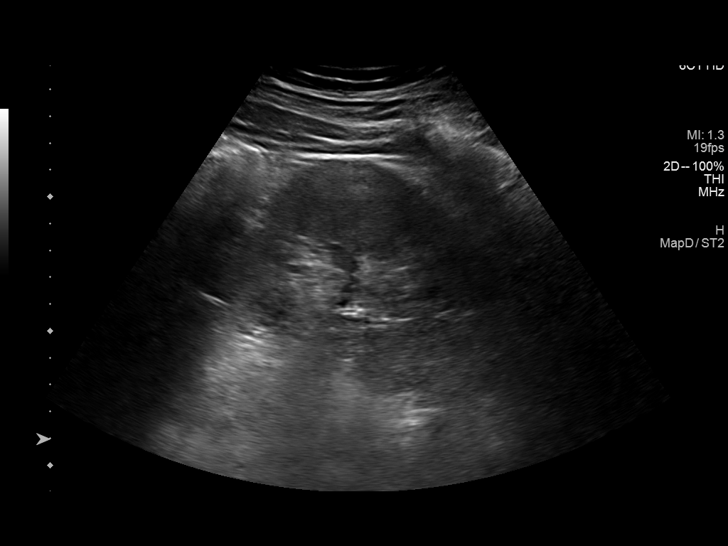
[im 46/55]
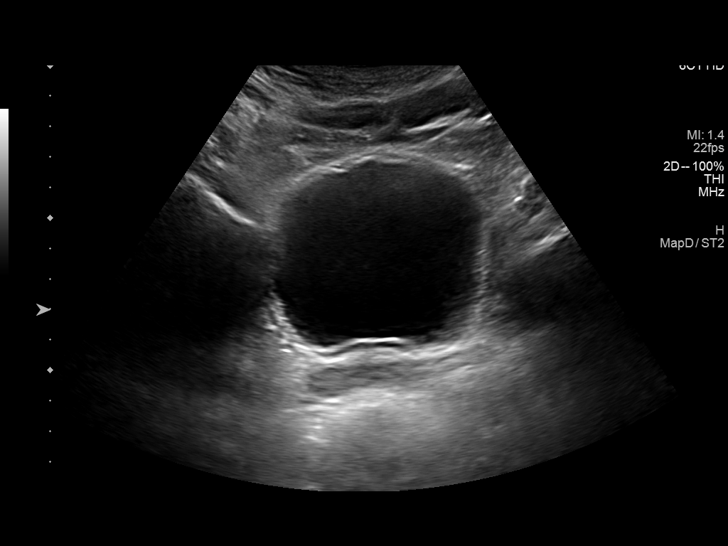
[im 50/55]
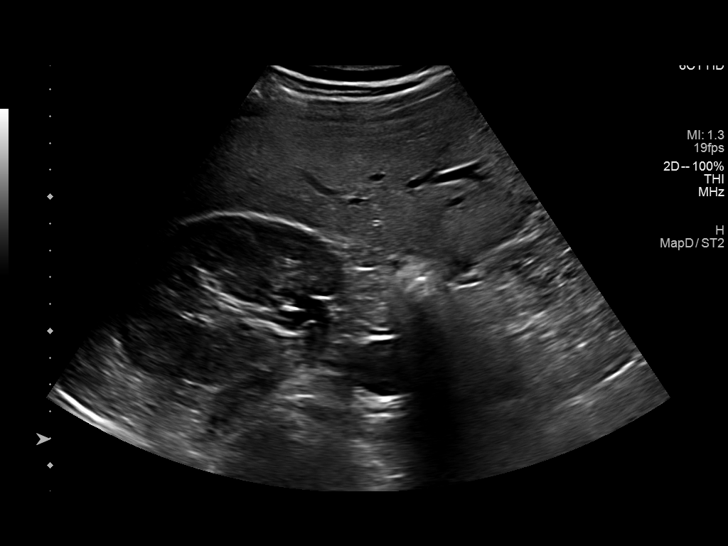
[im 55/55]
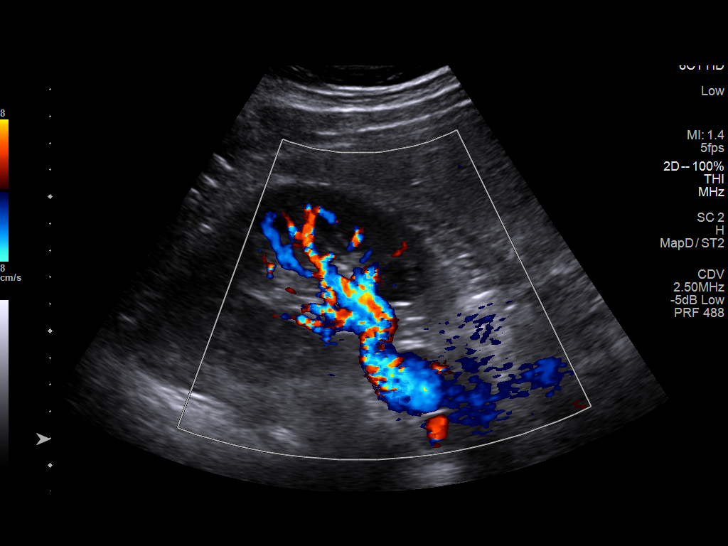

[14 of 25 positions shown; findings below may reference images not displayed]

FINDINGS: Right Kidney:

Renal measurements: 13.2 x 5.4 x 7.1 cm = volume: 263 mL. Possible 6
mm calculus in the right renal pelvis however without obstruction
this may be artifact. No renal mass. Negative for obstruction.
Renal cortex normal in echogenicity.

Left Kidney:

Renal measurements: 12.0 x 7.5 x 8.1 cm = volume: 383 mL. Fullness
of the left lower pole calices. Upper pole calices nondilated. No
renal mass. Normal echogenicity.

Bladder:

Appears normal for degree of bladder distention.

Other:

None.
IMPRESSION: Possible 6 mm calculus in the right renal pelvis without
obstruction. This may represent artifact. No shadowing.

Mild fullness left lower pole renal calices.

## 2022-07-06 ENCOUNTER — Encounter: Payer: Medicare PPO | Admitting: Nurse Practitioner

## 2022-07-15 ENCOUNTER — Telehealth: Payer: Self-pay | Admitting: Nurse Practitioner

## 2022-07-15 NOTE — Telephone Encounter (Signed)
Called pt to reschedule canceled apt no answer left VM

## 2022-07-16 ENCOUNTER — Ambulatory Visit: Payer: Medicare PPO

## 2022-07-16 ENCOUNTER — Ambulatory Visit: Payer: Medicare PPO | Admitting: Nurse Practitioner

## 2022-07-27 DIAGNOSIS — F251 Schizoaffective disorder, depressive type: Secondary | ICD-10-CM | POA: Diagnosis not present

## 2022-08-05 ENCOUNTER — Ambulatory Visit (INDEPENDENT_AMBULATORY_CARE_PROVIDER_SITE_OTHER): Payer: Medicare PPO

## 2022-08-05 ENCOUNTER — Encounter: Payer: Self-pay | Admitting: Nurse Practitioner

## 2022-08-05 ENCOUNTER — Ambulatory Visit (INDEPENDENT_AMBULATORY_CARE_PROVIDER_SITE_OTHER): Payer: Medicare PPO | Admitting: Nurse Practitioner

## 2022-08-05 VITALS — BP 118/62 | HR 85 | Temp 98.4°F | Ht 75.0 in | Wt 250.0 lb

## 2022-08-05 DIAGNOSIS — R091 Pleurisy: Secondary | ICD-10-CM | POA: Diagnosis not present

## 2022-08-05 DIAGNOSIS — R051 Acute cough: Secondary | ICD-10-CM

## 2022-08-05 DIAGNOSIS — E782 Mixed hyperlipidemia: Secondary | ICD-10-CM

## 2022-08-05 DIAGNOSIS — Z0000 Encounter for general adult medical examination without abnormal findings: Secondary | ICD-10-CM

## 2022-08-05 DIAGNOSIS — R7303 Prediabetes: Secondary | ICD-10-CM | POA: Diagnosis not present

## 2022-08-05 DIAGNOSIS — F2089 Other schizophrenia: Secondary | ICD-10-CM | POA: Insufficient documentation

## 2022-08-05 MED ORDER — NAPROXEN 500 MG PO TABS: ORAL_TABLET | ORAL | 2 refills | Status: DC

## 2022-08-05 MED ORDER — ATORVASTATIN CALCIUM 10 MG PO TABS: 10.0000 mg | ORAL_TABLET | Freq: Every day | ORAL | 11 refills | Status: DC

## 2022-08-05 MED ORDER — BENZONATATE 100 MG PO CAPS: 100.0000 mg | ORAL_CAPSULE | Freq: Four times a day (QID) | ORAL | 1 refills | Status: DC | PRN

## 2022-08-05 NOTE — Progress Notes (Signed)
I,Scott Farrell,acting as a Education administrator for [name], FNP.,have documented all relevant documentation on the behalf of [name], FNP,as directed by  [name], FNP while in the presence of [name], [name].    Subjective:     Patient ID: Scott Farrell , male    DOB: November 03, 1980 , 42 y.o.   MRN: [id number]   Chief Complaint  Patient presents with   Medical Management of Chronic Issues    lipids, pre-diabetes and Vit D    HPI  Patient presents today for follow up of lipids, pre-diabetes and Vit D. Patient reports left-sided chest pain, states feels like a pulled muscle; denies any known injury, shortness of breath or other symptoms.   He was seen at urgent care for his left knee pain so was letting that heal.   He is using a nicotine patch but feels like it is not working, he believes it is 21mg . Did for couple days.   Wt Readings from Last 3 Encounters: 08/05/22 : 250 lb (113.4 kg) 01/07/22 : 249 lb 5.4 oz (113.1 kg) 12/15/21 : 249 lb 6.4 oz (113.1 kg)  He is not working at this time. Continues to go to [employer]. He is to take depakote twice at night but he has a bruise underneath his rib after taking the medication. Feels like is a muscular pain. He is back to one pill a night.   Vit   Past Medical History:  Diagnosis Date   Clotting disorder ([place])    personal hx/o? etiology unclear   Costochondritis    history of   Diverticulitis    Herpes genitalia 06/2010   History of echocardiogram 01/07/2011   normal LV function, EF 60%, trace mitral, tricuspid and pulmonic regurgitation; Dr. Tollie Eth   IBS (irritable bowel syndrome)    Mental disorder 2013   hospitalized for psychiatric illness prior   Normal cardiac stress test 01/07/2011   negative treadmill test, no evidence of ischemia, good exercise capacity; Tollie Eth, MD;    Prostatitis 11/2010   Dr. Karsten Ro   Renal stone 06/2012   Urology consult   Schizophrenia [facility])    [place]  Psychiatry, [facility] - Dr. Lennon Alstrom   Tobacco use disorder      Family History  Problem Relation Age of Onset   Hypertension Mother    Stroke Mother    Depression Mother    Acne Mother    Thyroid disease Mother    Diabetes Mother    Colon cancer Mother 26   Acute lymphoblastic leukemia Father    Diabetes Father    Cancer Brother    Breast cancer Maternal Aunt    Ovarian cancer Maternal Aunt    Esophageal cancer Neg Hx    Stomach cancer Neg Hx      Current Outpatient Medications:    atorvastatin (LIPITOR) 10 MG tablet, Take 1 tablet (10 mg total) by mouth daily., Disp: 30 tablet, Rfl: 11   benzonatate (TESSALON PERLES) 100 MG capsule, Take 1 capsule (100 mg total) by mouth every 6 (six) hours as needed., Disp: 30 capsule, Rfl: 1   divalproex (DEPAKOTE) 500 MG DR tablet, Take 500 mg by mouth at bedtime., Disp: , Rfl:    naproxen (NAPROSYN) 500 MG tablet, Take 1 tablet by mouth 2 times a day x 5 days then as needed twice a day, Disp: 30 tablet, Rfl: 2   risperidone (RISPERDAL) 4 MG tablet, Take 4 mg by mouth 2 (two) times daily.,  Disp: , Rfl:    linaclotide (LINZESS) 145 MCG CAPS capsule, Take 1 capsule (145 mcg total) by mouth daily before breakfast., Disp: 8 capsule, Rfl: 0   linaclotide (LINZESS) 290 MCG CAPS capsule, Take 1 capsule (290 mcg total) by mouth daily before breakfast., Disp: 8 capsule, Rfl: 0   Allergies  Allergen Reactions   Metformin And Related Other (See Comments)    Chest tightness     Review of Systems  Constitutional: Negative.   Respiratory: Negative.    Cardiovascular: Negative.   Gastrointestinal: Negative.   Skin:        Left underneath breast bruising.   Neurological: Negative.   Psychiatric/Behavioral: Negative.       Today's Vitals   08/05/22 1457  BP: 118/62  Pulse: 85  Temp: 98.4 F (36.9 C)  TempSrc: Rectal  SpO2: 95%  Weight: 250 lb (113.4 kg)  Height: 6\' 3"  (1.905 m)   Body mass index is 31.25 kg/m.   Objective:   Physical Exam Vitals reviewed.  Constitutional:      General: He is not in acute distress.    Appearance: Normal appearance. He is obese.  HENT:     Head: Normocephalic.  Cardiovascular:     Rate and Rhythm: Normal rate and regular rhythm.     Pulses: Normal pulses.     Heart sounds: Normal heart sounds. No murmur heard. Pulmonary:     Effort: Pulmonary effort is normal. No respiratory distress.     Breath sounds: Normal breath sounds. No wheezing.  Skin:    General: Skin is warm and dry.     Capillary Refill: Capillary refill takes less than 2 seconds.  Neurological:     General: No focal deficit present.     Mental Status: He is alert and oriented to person, place, and time.     Cranial Nerves: No cranial nerve deficit.     Motor: No weakness.  Psychiatric:        Mood and Affect: Mood normal.        Behavior: Behavior normal.        Thought Content: Thought content normal.        Judgment: Judgment normal.         Assessment And Plan:     1. Mixed hyperlipidemia Comments: Cholesterol levels are elevated at last visit, will recheck lipid panel. - atorvastatin (LIPITOR) 10 MG tablet; Take 1 tablet (10 mg total) by mouth daily.  Dispense: 30 tablet; Refill: 11 - Lipid panel  2. Prediabetes Comments: Will check HgbA1c. Encouraged to limit intake of sugary foods and drinks. - Hemoglobin A1c  3. Acute cough - benzonatate (TESSALON PERLES) 100 MG capsule; Take 1 capsule (100 mg total) by mouth every 6 (six) hours as needed.  Dispense: 30 capsule; Refill: 1  4. Pleurisy Comments: Tenderness to left chest near rib 9-10. Has been having a dry cough will treat with naproxen - naproxen (NAPROSYN) 500 MG tablet; Take 1 tablet by mouth 2 times a day x 5 days then as needed twice a day  Dispense: 30 tablet; Refill: 2  5. Other schizophrenia ([place]) Comments: Continue f/u with Behavioral health advised him to call them about his new change with Depakote.     Patient was given  opportunity to ask questions. Patient verbalized understanding of the plan and was able to repeat key elements of the plan. All questions were answered to their satisfaction.  [name], FNP   I, [name], FNP, have  reviewed all documentation for this visit. The documentation on 08/05/22 for the exam, diagnosis, procedures, and orders are all accurate and complete.   IF YOU HAVE BEEN REFERRED TO A SPECIALIST, IT MAY TAKE 1-2 WEEKS TO SCHEDULE/PROCESS THE REFERRAL. IF YOU HAVE NOT HEARD FROM US/SPECIALIST IN TWO WEEKS, PLEASE GIVE [place] A CALL AT [phone] X [phone].   THE PATIENT IS ENCOURAGED TO PRACTICE SOCIAL DISTANCING DUE TO THE COVID-19 PANDEMIC.

## 2022-08-05 NOTE — Patient Instructions (Signed)
[name] , Thank you for taking time to come for your Medicare Wellness Visit. I appreciate your ongoing commitment to your health goals. Please review the following plan we discussed and let me know if I can assist you in the future.   These are the goals we discussed:  Goals      Manage My Medicine     Timeframe:  Long-Range Goal Priority:  High Start Date:                             Expected End Date:                       Follow Up Date 10/31/2021   - call for medicine refill 2 or 3 days before it runs out - call if I am sick and can't take my medicine - learn to read medicine labels - use a pillbox to sort medicine - use an alarm clock or phone to remind me to take my medicine    Why is this important?   These steps will help you keep on track with your medicines.   Notes:  Please call if you have any questions      Patient Stated     07/03/2020, wants to lose 40 pounds     Patient Stated     07/10/2021, wants to lose 40 pounds     Patient Stated     08/05/2022, wants to lose weight        This is a list of the screening recommended for you and due dates:  Health Maintenance  Topic Date Due   Flu Shot  12/30/2021   Medicare Annual Wellness Visit  08/05/2023   Colon Cancer Screening  10/31/2024   DTaP/Tdap/Td vaccine (2 - Td or Tdap) 12/16/2031   COVID-19 Vaccine  Completed   Hepatitis C Screening: USPSTF Recommendation to screen - Ages 33-79 yo.  Completed   HIV Screening  Completed   HPV Vaccine  Aged Out    Advanced directives: Advance directive discussed with you today. Even though you declined this today please call our office should you change your mind and we can give you the proper paperwork for you to fill out.  Conditions/risks identified: none  Next appointment: Follow up in one year for your annual wellness visit   Preventive Care 40-64 Years, Male Preventive care refers to lifestyle choices and visits with your health care provider that can  promote health and wellness. What does preventive care include? A yearly physical exam. This is also called an annual well check. Dental exams once or twice a year. Routine eye exams. Ask your health care provider how often you should have your eyes checked. Personal lifestyle choices, including: Daily care of your teeth and gums. Regular physical activity. Eating a healthy diet. Avoiding tobacco and drug use. Limiting alcohol use. Practicing safe sex. Taking low-dose aspirin every day starting at age 80. What happens during an annual well check? The services and screenings done by your health care provider during your annual well check will depend on your age, overall health, lifestyle risk factors, and family history of disease. Counseling  Your health care provider may ask you questions about your: Alcohol use. Tobacco use. Drug use. Emotional well-being. Home and relationship well-being. Sexual activity. Eating habits. Work and work Statistician. Screening  You may have the following tests or measurements: Height, weight, and BMI. Blood  pressure. Lipid and cholesterol levels. These may be checked every 5 years, or more frequently if you are over 35 years old. Skin check. Lung cancer screening. You may have this screening every year starting at age 40 if you have a 30-pack-year history of smoking and currently smoke or have quit within the past 15 years. Fecal occult blood test (FOBT) of the stool. You may have this test every year starting at age 82. Flexible sigmoidoscopy or colonoscopy. You may have a sigmoidoscopy every 5 years or a colonoscopy every 10 years starting at age 26. Prostate cancer screening. Recommendations will vary depending on your family history and other risks. Hepatitis C blood test. Hepatitis B blood test. Sexually transmitted disease (STD) testing. Diabetes screening. This is done by checking your blood sugar (glucose) after you have not eaten for a  while (fasting). You may have this done every 1-3 years. Discuss your test results, treatment options, and if necessary, the need for more tests with your health care provider. Vaccines  Your health care provider may recommend certain vaccines, such as: Influenza vaccine. This is recommended every year. Tetanus, diphtheria, and acellular pertussis (Tdap, Td) vaccine. You may need a Td booster every 10 years. Zoster vaccine. You may need this after age 74. Pneumococcal 13-valent conjugate (PCV13) vaccine. You may need this if you have certain conditions and have not been vaccinated. Pneumococcal polysaccharide (PPSV23) vaccine. You may need one or two doses if you smoke cigarettes or if you have certain conditions. Talk to your health care provider about which screenings and vaccines you need and how often you need them. This information is not intended to replace advice given to you by your health care provider. Make sure you discuss any questions you have with your health care provider. Document Released: 06/14/2015 Document Revised: 02/05/2016 Document Reviewed: 03/19/2015 Elsevier Interactive Patient Education  2017 [place] Prevention in the Home Falls can cause injuries. They can happen to people of all ages. There are many things you can do to make your home safe and to help prevent falls. What can I do on the outside of my home? Regularly fix the edges of walkways and driveways and fix any cracks. Remove anything that might make you trip as you walk through a door, such as a raised step or threshold. Trim any bushes or trees on the path to your home. Use bright outdoor lighting. Clear any walking paths of anything that might make someone trip, such as rocks or tools. Regularly check to see if handrails are loose or broken. Make sure that both sides of any steps have handrails. Any raised decks and porches should have guardrails on the edges. Have any leaves, snow, or ice  cleared regularly. Use sand or salt on walking paths during winter. Clean up any spills in your garage right away. This includes oil or grease spills. What can I do in the bathroom? Use night lights. Install grab bars by the toilet and in the tub and shower. Do not use towel bars as grab bars. Use non-skid mats or decals in the tub or shower. If you need to sit down in the shower, use a plastic, non-slip stool. Keep the floor dry. Clean up any water that spills on the floor as soon as it happens. Remove soap buildup in the tub or shower regularly. Attach bath mats securely with double-sided non-slip rug tape. Do not have throw rugs and other things on the floor that can make you  trip. What can I do in the bedroom? Use night lights. Make sure that you have a light by your bed that is easy to reach. Do not use any sheets or blankets that are too big for your bed. They should not hang down onto the floor. Have a firm chair that has side arms. You can use this for support while you get dressed. Do not have throw rugs and other things on the floor that can make you trip. What can I do in the kitchen? Clean up any spills right away. Avoid walking on wet floors. Keep items that you use a lot in easy-to-reach places. If you need to reach something above you, use a strong step stool that has a grab bar. Keep electrical cords out of the way. Do not use floor polish or wax that makes floors slippery. If you must use wax, use non-skid floor wax. Do not have throw rugs and other things on the floor that can make you trip. What can I do with my stairs? Do not leave any items on the stairs. Make sure that there are handrails on both sides of the stairs and use them. Fix handrails that are broken or loose. Make sure that handrails are as long as the stairways. Check any carpeting to make sure that it is firmly attached to the stairs. Fix any carpet that is loose or worn. Avoid having throw rugs at the  top or bottom of the stairs. If you do have throw rugs, attach them to the floor with carpet tape. Make sure that you have a light switch at the top of the stairs and the bottom of the stairs. If you do not have them, ask someone to add them for you. What else can I do to help prevent falls? Wear shoes that: Do not have high heels. Have rubber bottoms. Are comfortable and fit you well. Are closed at the toe. Do not wear sandals. If you use a stepladder: Make sure that it is fully opened. Do not climb a closed stepladder. Make sure that both sides of the stepladder are locked into place. Ask someone to hold it for you, if possible. Clearly mark and make sure that you can see: Any grab bars or handrails. First and last steps. Where the edge of each step is. Use tools that help you move around (mobility aids) if they are needed. These include: Canes. Walkers. Scooters. Crutches. Turn on the lights when you go into a dark area. Replace any light bulbs as soon as they burn out. Set up your furniture so you have a clear path. Avoid moving your furniture around. If any of your floors are uneven, fix them. If there are any pets around you, be aware of where they are. Review your medicines with your doctor. Some medicines can make you feel dizzy. This can increase your chance of falling. Ask your doctor what other things that you can do to help prevent falls. This information is not intended to replace advice given to you by your health care provider. Make sure you discuss any questions you have with your health care provider. Document Released: 03/14/2009 Document Revised: 10/24/2015 Document Reviewed: 06/22/2014 Elsevier Interactive Patient Education  2017 [employer].

## 2022-08-05 NOTE — Progress Notes (Signed)
Subjective:   Scott Farrell is a 42 y.o. male who presents for Medicare Annual/Subsequent preventive examination.  Review of Systems     Cardiac Risk Factors include: male gender;obesity (BMI >30kg/m2)     Objective:    Today's Vitals   08/05/22 1508  BP: 118/62  Pulse: 85  Temp: 98.4 F (36.9 C)  TempSrc: Oral  SpO2: 95%  Weight: 250 lb (113.4 kg)  Height: '6\' 3"'$  (1.905 m)   Body mass index is 31.25 kg/m.     08/05/2022    3:30 PM 01/07/2022    3:38 PM 11/25/2021    8:20 PM 09/09/2021    9:17 PM 07/10/2021    3:31 PM 08/12/2020    9:02 PM 07/03/2020    3:51 PM  Advanced Directives  Does Patient Have a Medical Advance Directive? No No No No No No No  Would patient like information on creating a medical advance directive? No - Patient declined          Current Medications (verified) Outpatient Encounter Medications as of 08/05/2022  Medication Sig   divalproex (DEPAKOTE) 500 MG DR tablet Take 500 mg by mouth at bedtime.   risperidone (RISPERDAL) 4 MG tablet Take 4 mg by mouth 2 (two) times daily.   No facility-administered encounter medications on file as of 08/05/2022.    Allergies (verified) Metformin and related   History: Past Medical History:  Diagnosis Date   Clotting disorder ([place])    personal hx/o? etiology unclear   Costochondritis    history of   Diverticulitis    Herpes genitalia 06/2010   History of echocardiogram 01/07/2011   normal LV function, EF 60%, trace mitral, tricuspid and pulmonic regurgitation; Dr. Tollie Eth   IBS (irritable bowel syndrome)    Mental disorder 2013   hospitalized for psychiatric illness prior   Normal cardiac stress test 01/07/2011   negative treadmill test, no evidence of ischemia, good exercise capacity; Tollie Eth, MD;    Prostatitis 11/2010   Dr. Karsten Ro   Renal stone 06/2012   Urology consult   Schizophrenia [facility])    [facility]iatry, [facility] - Dr. Lennon Alstrom   Tobacco use disorder    Past  Surgical History:  Procedure Laterality Date   EUS  06/30/2012   Procedure: UPPER ENDOSCOPIC ULTRASOUND (EUS) LINEAR;  Surgeon: Milus Banister, MD;  Location: [facility] ENDOSCOPY;  Service: Endoscopy;  Laterality: N/A;  radial linear   WISDOM TOOTH EXTRACTION     Family History  Problem Relation Age of Onset   Hypertension Mother    Stroke Mother    Depression Mother    Acne Mother    Thyroid disease Mother    Diabetes Mother    Colon cancer Mother 77   Acute lymphoblastic leukemia Father    Diabetes Father    Cancer Brother    Breast cancer Maternal Aunt    Ovarian cancer Maternal Aunt    Esophageal cancer Neg Hx    Stomach cancer Neg Hx    Social History   Socioeconomic History   Marital status: Single    Spouse name: Not on file   Number of children: 1   Years of education: Not on file   Highest education level: Not on file  Occupational History   Occupation: [employer]  Tobacco Use   Smoking status: Some Days    Packs/day: 0.25    Years: 23.00    Total pack years: 5.75    Types: Cigars, Cigarettes  Passive exposure: Never   Smokeless tobacco: Never   Tobacco comments:    07/10/2021, smokes 4 [place] cigars daily, he is using a nicotine patch  Vaping Use   Vaping Use: Some days   Substances: Nicotine  Substance and Sexual Activity   Alcohol use: Yes    Comment: occ   Drug use: Not Currently   Sexual activity: Not Currently    Partners: Female  Other Topics Concern   Not on file  Social History Narrative   Lives with mother, Scott Farrell, exercising with walking, running, no current relationship; unemployed   Caffeine use: 1 cup per day   Right handed   Social Determinants of Health   Financial Resource Strain: Low Risk  (08/05/2022)   Overall Financial Resource Strain (CARDIA)    Difficulty of Paying Living Expenses: Not hard at all  Food Insecurity: No Food Insecurity (08/05/2022)   Hunger Vital Sign    Worried About Running Out of Food in the Last Year: Never  true    Landfall in the Last Year: Never true  Transportation Needs: Unmet Transportation Needs (08/05/2022)   PRAPARE - Transportation    Lack of Transportation (Medical): Yes    Lack of Transportation (Non-Medical): Yes  Physical Activity: Inactive (08/05/2022)   Exercise Vital Sign    Days of Exercise per Week: 0 days    Minutes of Exercise per Session: 0 min  Stress: Stress Concern Present (08/05/2022)   [place]    Feeling of Stress : To some extent  Social Connections: Not on file    Tobacco Counseling Ready to quit: Not Answered Counseling given: Not Answered Tobacco comments: 07/10/2021, smokes [place] cigars daily, he is using a nicotine patch   Clinical Intake:  Pre-visit preparation completed: Yes  Pain : No/denies pain     Nutritional Status: BMI > 30  Obese Diabetes: No  How often do you need to have someone help you when you read instructions, pamphlets, or other written materials from your doctor or pharmacy?: 1 - Never  Diabetic? no  Interpreter Needed?: No  Information entered by :: NAllen LPN   Activities of Daily Living    08/05/2022    3:31 PM  In your present state of health, do you have any difficulty performing the following activities:  Hearing? 0  Vision? 1  Comment blurry sometimes  Difficulty concentrating or making decisions? 1  Walking or climbing stairs? 0  Dressing or bathing? 0  Doing errands, shopping? 0  Preparing Food and eating ? N  Using the Toilet? N  In the past six months, have you accidently leaked urine? N  Do you have problems with loss of bowel control? N  Managing your Medications? N  Managing your Finances? N  Housekeeping or managing your Housekeeping? N    Patient Care Team: [name], FNP as PCP - General (General Practice) Mayford Knife, [facility] (Pharmacist)  Indicate any recent Medical Services you may have received from other  than [facility] providers in the past year (date may be approximate).     Assessment:   This is a routine wellness examination for Scott Farrell.  Hearing/Vision screen Vision Screening - Comments:: No regular eye exams,   Dietary issues and exercise activities discussed: Current Exercise Habits: The patient does not participate in regular exercise at present   Goals Addressed             This Visit's Progress  Patient Stated       08/05/2022, wants to lose weight       Depression Screen    08/05/2022    3:31 PM 08/05/2022    2:56 PM 12/15/2021    2:53 PM 07/10/2021    3:32 PM 09/03/2020    3:03 PM 07/03/2020    3:52 PM 09/27/2019   10:55 AM  PHQ 2/9 Scores  PHQ - 2 Score 0 0 2 0 0 2 3  PHQ- 9 Score   '7   6 8    '$ Fall Risk    08/05/2022    3:30 PM 08/05/2022    2:56 PM 12/15/2021    2:53 PM 07/10/2021    3:32 PM 07/03/2020    3:52 PM  Fall Risk   Falls in the past year? 0 0 0 0 0  Number falls in past yr: 0  0    Injury with Fall? 0  0    Risk for fall due to : Medication side effect   Medication side effect Medication side effect  Follow up Falls prevention discussed;Education provided;Falls evaluation completed  Falls evaluation completed Falls evaluation completed;Education provided;Falls prevention discussed Falls evaluation completed;Education provided;Falls prevention discussed    FALL RISK PREVENTION PERTAINING TO THE HOME:  Any stairs in or around the home? Yes  If so, are there any without handrails? No  Home free of loose throw rugs in walkways, pet beds, electrical cords, etc? Yes  Adequate lighting in your home to reduce risk of falls?  Missing a light over the stairs  ASSISTIVE DEVICES UTILIZED TO PREVENT FALLS:  Life alert? No  Use of a cane, walker or w/c? No  Grab bars in the bathroom? No  Shower chair or bench in shower? No  Elevated toilet seat or a handicapped toilet? Yes   TIMED UP AND GO:  Was the test performed? Yes .  Length of time to ambulate 10 feet:  5 sec.   Gait steady and fast without use of assistive device  Cognitive Function:        08/05/2022    3:32 PM 07/10/2021    3:34 PM 07/03/2020    3:56 PM  6CIT Screen  What Year? 0 points 0 points 0 points  What month? 0 points 0 points 0 points  What time? 0 points 0 points 0 points  Count back from 20 0 points 0 points 0 points  Months in reverse 0 points 4 points 0 points  Repeat phrase 0 points 0 points 0 points  Total Score 0 points 4 points 0 points    Immunizations Immunization History  Administered Date(s) Administered   COVID-19, mRNA, vaccine(Comirnaty)12 years and older 04/07/2022   Influenza Inj Mdck Quad Pf 06/02/2018   Influenza-Unspecified 03/02/2020   Moderna Sars-Covid-2 Vaccination 09/18/2019, 10/19/2019, 05/18/2020   Pfizer Covid-19 Vaccine Bivalent Booster 43yr & up 05/28/2021   Tdap 12/15/2021    TDAP status: Up to date  Flu Vaccine status: Up to date  Pneumococcal vaccine status: Up to date  Covid-19 vaccine status: Completed vaccines  Qualifies for Shingles Vaccine? No   Zostavax completed  n/a   Shingrix Completed?: n/a  Screening Tests Health Maintenance  Topic Date Due   INFLUENZA VACCINE  12/30/2021   Medicare Annual Wellness (AWV)  07/10/2022   COLONOSCOPY (Pts 45-437yrInsurance coverage will need to be confirmed)  10/31/2024   DTaP/Tdap/Td (2 - Td or Tdap) 12/16/2031   COVID-19 Vaccine  Completed   Hepatitis C  Screening  Completed   HIV Screening  Completed   HPV VACCINES  Aged Out    Health Maintenance  Health Maintenance Due  Topic Date Due   INFLUENZA VACCINE  12/30/2021   Medicare Annual Wellness (AWV)  07/10/2022    Colorectal cancer screening: Type of screening: Colonoscopy. Completed 10/31/2021. Repeat every 3 years  Lung Cancer Screening: (Low Dose CT Chest recommended if Age 90-80 years, 30 pack-year currently smoking OR have quit w/in 15years.) does not qualify.   Lung Cancer Screening Referral: no  Additional  Screening:  Hepatitis C Screening: does qualify; Completed 05/27/2020  Vision Screening: Recommended annual ophthalmology exams for early detection of glaucoma and other disorders of the eye. Is the patient up to date with their annual eye exam?  No  Who is the provider or what is the name of the office in which the patient attends annual eye exams? none If pt is not established with a provider, would they like to be referred to a provider to establish care? No .   Dental Screening: Recommended annual dental exams for proper oral hygiene  Community Resource Referral / Chronic Care Management: CRR required this visit?  No   CCM required this visit?  No      Plan:     I have personally reviewed and noted the following in the patient's chart:   Medical and social history Use of alcohol, tobacco or illicit drugs  Current medications and supplements including opioid prescriptions. Patient is not currently taking opioid prescriptions. Functional ability and status Nutritional status Physical activity Advanced directives List of other physicians Hospitalizations, surgeries, and ER visits in previous 12 months Vitals Screenings to include cognitive, depression, and falls Referrals and appointments  In addition, I have reviewed and discussed with patient certain preventive protocols, quality metrics, and best practice recommendations. A written personalized care plan for preventive services as well as general preventive health recommendations were provided to patient.     Kellie Simmering, LPN   [id number]   Nurse Notes: none

## 2022-08-06 LAB — LIPID PANEL
Chol/HDL Ratio: 6.3 ratio — ABNORMAL HIGH (ref 0.0–5.0)
Cholesterol, Total: 163 mg/dL (ref 100–199)
HDL: 26 mg/dL — ABNORMAL LOW (ref >39)
LDL Chol Calc (NIH): 87 mg/dL (ref 0–99)
Triglycerides: 302 mg/dL — ABNORMAL HIGH (ref 0–149)
VLDL Cholesterol Cal: 50 mg/dL — ABNORMAL HIGH (ref 5–40)

## 2022-08-06 LAB — HEMOGLOBIN A1C
Est. average glucose Bld gHb Est-mCnc: 128 mg/dL
Hgb A1c MFr Bld: 6.1 % — ABNORMAL HIGH (ref 4.8–5.6)

## 2022-08-07 ENCOUNTER — Telehealth: Payer: Self-pay | Admitting: *Deleted

## 2022-08-07 NOTE — Progress Notes (Unsigned)
  Care Coordination  Outreach Note  08/07/2022 Name: Scott Farrell MRN: [id number] DOB: [birth year]   Care Coordination Outreach Attempts: An unsuccessful telephone outreach was attempted today to offer the patient information about available care coordination services as a benefit of their health plan.   Follow Up Plan:  Additional outreach attempts will be made to offer the patient care coordination information and services.   Encounter Outcome:  No Answer  [place]  Direct Dial: [phone]

## 2022-08-10 NOTE — Progress Notes (Unsigned)
  Care Coordination  Outreach Note  08/10/2022 Name: Scott Farrell MRN: [id number] DOB: [birth year]   Care Coordination Outreach Attempts: A second unsuccessful outreach was attempted today to offer the patient with information about available care coordination services as a benefit of their health plan.     Follow Up Plan:  Additional outreach attempts will be made to offer the patient care coordination information and services.   Encounter Outcome:  No Answer  [place]  Direct Dial: [phone]

## 2022-08-11 NOTE — Progress Notes (Signed)
  Care Coordination  Outreach Note  08/11/2022 Name: Scott Farrell MRN: [id number] DOB: 10-26-1980   Care Coordination Outreach Attempts: A third unsuccessful outreach was attempted today to offer the patient with information about available care coordination services as a benefit of their health plan.   Follow Up Plan:  No further outreach attempts will be made at this time. We have been unable to contact the patient to offer or enroll patient in care coordination services  Encounter Outcome:  No Answer  [place]: [phone]

## 2022-08-13 NOTE — Progress Notes (Signed)
08/14/2022 Scott Farrell [id number] May 20, 1981  Referring provider: [name], FNP Primary GI doctor: [name], previously known to Dr. [name].  ASSESSMENT AND PLAN:   Irritable bowel syndrome with constipation Likely slow transit/tortuosity contributing to the constipation Miralax has not helped - linzess samples given - instructions how to take given to patient in AVS - lifestyle changes discussed  3 month follow up  Benign neoplasm of ascending colon and family history of colon cancer 10/31/2021 Colonoscopy Dr. Ardis Hughs 4 polyps 3-6 mm transverse and ascending colon, diverticulosis left colon, luminal narrowing, edema and tortuosity. TA polpys, recall 3 years.  Recall 10/2024  Diverticulosis No hematochezia no fever, chills, no evidence of SCAD Will call if any symptoms. Add on fiber supplement, avoid NSAIDS, information given    Patient Care Team: [name], FNP as PCP - General (General Practice) Mayford Knife, [facility] (Pharmacist)  HISTORY OF PRESENT ILLNESS: 42 y.o. male with a past medical history of schizophrenia, tobacco use, and others listed below presents for evaluation of IBS and right-sided abdominal pain.Marland Kitchen   08/09/2012 colonoscopy with Dr. [name] due to family history colon cancer in mother 4 Suprep used with excellent prep.  Flat tubular adenoma polyp, sessile serrated adenomatous polyp otherwise normal.  Recall 5 years. 06/07/2012 endoscopy abnormal CT of GI tract was completely normal recommend EUS. 09/09/2021 CT abdomen pelvis with contrast in the ER for left lower abdominal pain showed normal liver, no gallstones no biliary dilatation unremarkable pancreas, normal spleen, normal stomach normal appendix, no bowel thickening or inflammatory changes.  Diverticular disease distal colon without acute wall thickening 10/31/2021 Colonoscopy Dr. Ardis Hughs 4 polyps 3-6 mm transverse and ascending colon, diverticulosis left colon, luminal narrowing,  edema and tortuosity. TA polpys, recall 3 years.  Add on fiber and told to follow up.    He presents today with continuing constipation and AB pain.  Mom gave him a laxative that has helped, he has less AB pain.  He has BM once every 2-3 days, hard stool.  Has tried miralax in the past that helped some but caused more bloating.  No fever, chills, no nausea, vomiting.  No hematochezia.  No GERD, melena.  ETOH twice a month. Daily cigarettes and cigars, no drug use.  Excerderin migraine once a month.   Current Medications:    Current Outpatient Medications (Cardiovascular):    atorvastatin (LIPITOR) 10 MG tablet, Take 1 tablet (10 mg total) by mouth daily.  Current Outpatient Medications (Respiratory):    benzonatate (TESSALON PERLES) 100 MG capsule, Take 1 capsule (100 mg total) by mouth every 6 (six) hours as needed.  Current Outpatient Medications (Analgesics):    naproxen (NAPROSYN) 500 MG tablet, Take 1 tablet by mouth 2 times a day x 5 days then as needed twice a day   Current Outpatient Medications (Other):    divalproex (DEPAKOTE) 500 MG DR tablet, Take 500 mg by mouth at bedtime.   linaclotide (LINZESS) 145 MCG CAPS capsule, Take 1 capsule (145 mcg total) by mouth daily before breakfast.   linaclotide (LINZESS) 290 MCG CAPS capsule, Take 1 capsule (290 mcg total) by mouth daily before breakfast.   risperidone (RISPERDAL) 4 MG tablet, Take 4 mg by mouth 2 (two) times daily.  Medical History:  Past Medical History:  Diagnosis Date   Clotting disorder [facility])    personal hx/o? etiology unclear   Costochondritis    history of   Diverticulitis    Herpes genitalia 06/2010   History of  echocardiogram 01/07/2011   normal LV function, EF 60%, trace mitral, tricuspid and pulmonic regurgitation; Dr. Tollie Eth   IBS (irritable bowel syndrome)    Mental disorder 2013   hospitalized for psychiatric illness prior   Normal cardiac stress test 01/07/2011   negative treadmill  test, no evidence of ischemia, good exercise capacity; Tollie Eth, MD;    Prostatitis 11/2010   Dr. Karsten Ro   Renal stone 06/2012   Urology consult   Schizophrenia [facility])    [facility] Psychiatry, [facility] - Dr. Lennon Alstrom   Tobacco use disorder    Allergies:  Allergies  Allergen Reactions   Metformin And Related Other (See Comments)    Chest tightness     Surgical History:  He  has a past surgical history that includes EUS (06/30/2012) and Wisdom tooth extraction. Family History:  His family history includes Acne in his mother; Acute lymphoblastic leukemia in his father; Breast cancer in his maternal aunt; Cancer in his brother; Colon cancer (age of onset: 57) in his mother; Depression in his mother; Diabetes in his father and mother; Hypertension in his mother; Ovarian cancer in his maternal aunt; Stroke in his mother; Thyroid disease in his mother. Social History:   reports that he has been smoking cigars and cigarettes. He has a 5.75 pack-year smoking history. He has never been exposed to tobacco smoke. He has never used smokeless tobacco. He reports current alcohol use. He reports that he does not currently use drugs.  REVIEW OF SYSTEMS  : All other systems reviewed and negative except where noted in the History of Present Illness.  PHYSICAL EXAM: BP 116/60 (BP Location: Left Arm, Patient Position: Sitting)   Pulse 60   Ht 6\' 3"  (1.905 m)   Wt 250 lb 6 oz (113.6 kg)   SpO2 98%   BMI 31.29 kg/m  General:   Pleasant, well developed male in no acute distress Head:   Normocephalic and atraumatic. Eyes:  sclerae anicteric,conjunctive pink  Heart:   regular rate and rhythm Pulm:  Clear anteriorly; no wheezing Abdomen:   Soft, Obese AB, Active bowel sounds. moderate tenderness in the entire abdomen. With guarding and Without rebound, No organomegaly appreciated. Extremities:  Without edema. Msk: Symmetrical without gross deformities. Peripheral pulses intact.  Neurologic:   Alert and  oriented x4;  No focal deficits.  Skin:   Dry and intact without significant lesions or rashes. Psychiatric:  Cooperative. Normal mood and affect.  Vladimir Crofts, PA-C 11:28 AM

## 2022-08-14 ENCOUNTER — Encounter: Payer: Self-pay | Admitting: Physician Assistant

## 2022-08-14 ENCOUNTER — Ambulatory Visit: Payer: Medicare PPO | Admitting: Physician Assistant

## 2022-08-14 VITALS — BP 116/60 | HR 60 | Ht 75.0 in | Wt 250.4 lb

## 2022-08-14 DIAGNOSIS — Z800 Family history of malignant neoplasm of digestive organs: Secondary | ICD-10-CM

## 2022-08-14 DIAGNOSIS — K581 Irritable bowel syndrome with constipation: Secondary | ICD-10-CM

## 2022-08-14 DIAGNOSIS — K5730 Diverticulosis of large intestine without perforation or abscess without bleeding: Secondary | ICD-10-CM | POA: Diagnosis not present

## 2022-08-14 DIAGNOSIS — D122 Benign neoplasm of ascending colon: Secondary | ICD-10-CM | POA: Diagnosis not present

## 2022-08-14 MED ORDER — LINACLOTIDE 145 MCG PO CAPS: 145.0000 ug | ORAL_CAPSULE | Freq: Every day | ORAL | 0 refills | Status: DC

## 2022-08-14 MED ORDER — LINACLOTIDE 290 MCG PO CAPS: 290.0000 ug | ORAL_CAPSULE | Freq: Every day | ORAL | 0 refills | Status: DC

## 2022-08-14 NOTE — Patient Instructions (Addendum)
Linzess 145 mcg daily, can increase to 290 mcg if you needed *IBS-C patients may begin to experience relief from belly pain and overall abdominal symptoms (pain, discomfort, and bloating) in about 1 week,  with symptoms typically improving over 12 weeks.  Take at least 30 minutes before the first meal of the day on an empty stomach You can have a loose stool if you eat a high-fat breakfast. Give it at least 7 days, may have more bowel movements during that time.   The diarrhea should go away and you should start having normal, complete, full bowel movements.  It may be helpful to start treatment when you can be near the comfort of your own bathroom, such as a weekend.  After you are out we can send in a prescription if you did well, there is a prescription card   Diverticulosis Diverticulosis is a condition that develops when small pouches (diverticula) form in the wall of the large intestine (colon). The colon is where water is absorbed and stool (feces) is formed. The pouches form when the inside layer of the colon pushes through weak spots in the outer layers of the colon. You may have a few pouches or many of them. The pouches usually do not cause problems unless they become inflamed or infected. When this happens, the condition is called diverticulitis- this is left lower quadrant pain, diarrhea, fever, chills, nausea or vomiting.  If this occurs please call the office or go to the hospital. Sometimes these patches without inflammation can also have painless bleeding associated with them, if this happens please call the office or go to the hospital. Preventing constipation and increasing fiber can help reduce diverticula and prevent complications. Even if you feel you have a high-fiber diet, suggest getting on Benefiber or Cirtracel 2 times daily.  Diverticulosis Diverticulosis is a condition that develops when small pouches (diverticula) form in the wall of the large intestine (colon). The  colon is where water is absorbed and stool (feces) is formed. The pouches form when the inside layer of the colon pushes through weak spots in the outer layers of the colon. You may have a few pouches or many of them. The pouches usually do not cause problems unless they become inflamed or infected. When this happens, the condition is called diverticulitis- this is left lower quadrant pain, diarrhea, fever, chills, nausea or vomiting.  If this occurs please call the office or go to the hospital. Sometimes these patches without inflammation can also have painless bleeding associated with them, if this happens please call the office or go to the hospital. Preventing constipation and increasing fiber can help reduce diverticula and prevent complications. Even if you feel you have a high-fiber diet, suggest getting on Benefiber or Cirtracel 2 times daily.  I appreciate the opportunity to care for you. Vicie Mutters, PA-C

## 2022-08-17 ENCOUNTER — Encounter: Payer: Self-pay | Admitting: Nurse Practitioner

## 2022-08-17 ENCOUNTER — Telehealth: Payer: Self-pay | Admitting: *Deleted

## 2022-08-17 NOTE — Progress Notes (Signed)
Agree with assessment and plan as outlined.  

## 2022-08-17 NOTE — Telephone Encounter (Signed)
Contacted regarding PREP Class referral. Left voice message to return call for more information. 

## 2022-08-17 NOTE — Telephone Encounter (Signed)
Patient returned call regarding PREP Class referral. He is interested in participating at the [place] or the [facility]. He states he will need to arrange transportation, We will call back with class availability.

## 2022-09-14 ENCOUNTER — Encounter: Payer: Self-pay | Admitting: *Deleted

## 2022-09-21 ENCOUNTER — Telehealth: Payer: Self-pay

## 2022-09-21 NOTE — Telephone Encounter (Signed)
Called to discuss Scott Farrell PREP schedule of classes; will use bus for transportation so needs to attend at [place]. Will ask Pam RN [facility] contact him with next available class, knows it may be June.

## 2022-09-30 ENCOUNTER — Other Ambulatory Visit (HOSPITAL_COMMUNITY): Payer: Self-pay

## 2022-09-30 ENCOUNTER — Other Ambulatory Visit: Payer: Self-pay

## 2022-09-30 ENCOUNTER — Other Ambulatory Visit: Payer: Self-pay | Admitting: *Deleted

## 2022-09-30 MED ORDER — LINACLOTIDE 145 MCG PO CAPS
145.0000 ug | ORAL_CAPSULE | Freq: Every day | ORAL | 0 refills | Status: DC
  Filled 2022-09-30: qty 30, 30d supply, fill #0

## 2022-09-30 NOTE — Telephone Encounter (Signed)
From: Azzie Glatter To: Office of Doree Albee, [place] Sent: 09/29/2022 5:43 PM EDT Subject: Medication Renewal Request  Refills have been requested for the following medications:   linaclotide (LINZESS) 145 MCG CAPS capsule Dyanne Carrel [place]]  Preferred pharmacy: [facility] Delivery method: Mail

## 2022-10-01 ENCOUNTER — Other Ambulatory Visit: Payer: Self-pay

## 2022-10-02 ENCOUNTER — Telehealth: Payer: Self-pay

## 2022-10-02 NOTE — Telephone Encounter (Signed)
Pt returned call.  Confirmed he can do May 20th start of PREP  MW 230p-345p will use bus for transport Intake scheduled for 10/14/22 at 230p at [facility] Pt has my number for contact.

## 2022-10-02 NOTE — Telephone Encounter (Signed)
VMT pt requesting call back to discuss starting PREP 

## 2022-10-13 ENCOUNTER — Telehealth: Payer: Self-pay

## 2022-10-13 NOTE — Telephone Encounter (Signed)
Call to pt reference appt for intake tomorrow at 230p LVMT.   Pt returned call. He has changed his mind about using bus as transportation. He will have transportation at end of month. Agreeable to me calling him for a June class.   Left another message. Potentially can help him with transportation, request call back to discuss.

## 2022-10-22 DIAGNOSIS — F251 Schizoaffective disorder, depressive type: Secondary | ICD-10-CM | POA: Diagnosis not present

## 2022-10-27 DIAGNOSIS — L905 Scar conditions and fibrosis of skin: Secondary | ICD-10-CM | POA: Diagnosis not present

## 2022-10-27 DIAGNOSIS — L720 Epidermal cyst: Secondary | ICD-10-CM | POA: Diagnosis not present

## 2022-10-27 DIAGNOSIS — D170 Benign lipomatous neoplasm of skin and subcutaneous tissue of head, face and neck: Secondary | ICD-10-CM | POA: Diagnosis not present

## 2022-11-06 ENCOUNTER — Telehealth: Payer: Self-pay

## 2022-11-06 NOTE — Telephone Encounter (Signed)
Call to pt reference starting PREP on 11/16/22 Confirmed he would like to attend. May have some appts he needs to work around  Intake scheduled for 11/09/22 at 1pm  Will meet in the lobby of the [facility]

## 2022-11-09 ENCOUNTER — Telehealth: Payer: Self-pay

## 2022-11-09 NOTE — Telephone Encounter (Signed)
Received vmf pt over weekend (11/07/22). He doesn't think he can commit to length of program. He is going to join the gym thru [employer] and workout on own that would better suit his schedule.

## 2022-11-09 NOTE — Progress Notes (Unsigned)
11/10/2022 Scott Farrell [id number] 07-11-1980  Referring provider: Arnette Felts, FNP Primary GI doctor: Dr. Adela Lank, previously known to Dr. Christella Hartigan.  ASSESSMENT AND PLAN:   Irritable bowel syndrome with constipation Likely slow transit/tortuosity contributing to the constipation Miralax has not helped, failed laxatives - given linzess 72 mcg, linzess 145 mcg sent in  - add on fiber, FODMAP given 1 year follow up  Benign neoplasm of ascending colon and family history of colon cancer 10/31/2021 Colonoscopy Dr. Christella Hartigan 4 polyps 3-6 mm transverse and ascending colon, diverticulosis left colon, luminal narrowing, edema and tortuosity. TA polpys, recall 3 years.  Recall 10/2024  Diverticulosis No hematochezia no fever, chills, no evidence of SCAD Will call if any symptoms. Add on fiber supplement, avoid NSAIDS, information given    Patient Care Team: Arnette Felts, FNP as PCP - General (General Practice) Harlan Stains, [facility] (Pharmacist)  HISTORY OF PRESENT ILLNESS: 42 y.o. male with a past medical history of schizophrenia, tobacco use, and others listed below presents for evaluation of IBS and right-sided abdominal pain.Marland Kitchen   08/09/2012 colonoscopy with Dr. Arlyce Dice due to family history colon cancer in mother 80 Suprep used with excellent prep.  Flat tubular adenoma polyp, sessile serrated adenomatous polyp otherwise normal.  Recall 5 years. 06/07/2012 endoscopy abnormal CT of GI tract was completely normal recommend EUS. 09/09/2021 CT abdomen pelvis with contrast in the ER for left lower abdominal pain showed normal liver, no gallstones no biliary dilatation unremarkable pancreas, normal spleen, normal stomach normal appendix, no bowel thickening or inflammatory changes.  Diverticular disease distal colon without acute wall thickening 10/31/2021 Colonoscopy Dr. Christella Hartigan 4 polyps 3-6 mm transverse and ascending colon, diverticulosis left colon, luminal narrowing, edema and  tortuosity. TA polpys, recall 3 years.   08/14/2022 OV with myself for IBS-C, given linzess samples since mirlax not helping, here to follow up.  He states he did better on the 145 mcg, with the 290 he had more loose stools. The 145 mcg helped with the bloating and AB pain as well.  Has failed miralax and laxatives in the past.  With the linzess he has a BM, formed stools.  He states he is gaining weight, not losing weight.  No fever, chills, no nausea, vomiting.  No hematochezia.  No GERD, melena.  ETOH twice a month. Daily cigarettes and cigars, no drug use.  Excerderin migraine once a month.   Wt Readings from Last 5 Encounters:  11/10/22 251 lb (113.9 kg)  08/14/22 250 lb 6 oz (113.6 kg)  08/05/22 250 lb (113.4 kg)  08/05/22 250 lb (113.4 kg)  01/07/22 249 lb 5.4 oz (113.1 kg)     Current Medications:    Current Outpatient Medications (Cardiovascular):    atorvastatin (LIPITOR) 10 MG tablet, Take 1 tablet (10 mg total) by mouth daily.  Current Outpatient Medications (Respiratory):    benzonatate (TESSALON PERLES) 100 MG capsule, Take 1 capsule (100 mg total) by mouth every 6 (six) hours as needed. (Patient not taking: Reported on 11/10/2022)  Current Outpatient Medications (Analgesics):    naproxen (NAPROSYN) 500 MG tablet, Take 1 tablet by mouth 2 times a day x 5 days then as needed twice a day (Patient not taking: Reported on 11/10/2022)   Current Outpatient Medications (Other):    divalproex (DEPAKOTE) 500 MG DR tablet, Take 500 mg by mouth at bedtime.   risperidone (RISPERDAL) 4 MG tablet, Take 4 mg by mouth 2 (two) times daily.   linaclotide (LINZESS) 145 MCG  CAPS capsule, Take 1 capsule (145 mcg total) by mouth daily before breakfast.  Medical History:  Past Medical History:  Diagnosis Date   Clotting disorder (HCC)    personal hx/o? etiology unclear   Costochondritis    history of   Diverticulitis    Herpes genitalia 06/2010   History of echocardiogram  01/07/2011   normal LV function, EF 60%, trace mitral, tricuspid and pulmonic regurgitation; Dr. Viann Fish   IBS (irritable bowel syndrome)    Mental disorder 2013   hospitalized for psychiatric illness prior   Normal cardiac stress test 01/07/2011   negative treadmill test, no evidence of ischemia, good exercise capacity; Viann Fish, MD;    Prostatitis 11/2010   Dr. Vernie Ammons   Renal stone 06/2012   Urology consult   Schizophrenia [facility])    [facility]iatry, [facility] - Dr. Marga Melnick   Tobacco use disorder    Allergies:  Allergies  Allergen Reactions   Metformin And Related Other (See Comments)    Chest tightness     Surgical History:  He  has a past surgical history that includes EUS (06/30/2012) and Wisdom tooth extraction. Family History:  His family history includes Acne in his mother; Acute lymphoblastic leukemia in his father; Breast cancer in his maternal aunt; Cancer in his brother; Colon cancer (age of onset: 77) in his mother; Depression in his mother; Diabetes in his father and mother; Hypertension in his mother; Ovarian cancer in his maternal aunt; Stroke in his mother; Thyroid disease in his mother. Social History:   reports that he has been smoking cigars and cigarettes. He has a 5.75 pack-year smoking history. He has never been exposed to tobacco smoke. He has never used smokeless tobacco. He reports current alcohol use. He reports that he does not currently use drugs.  REVIEW OF SYSTEMS  : All other systems reviewed and negative except where noted in the History of Present Illness.  PHYSICAL EXAM: BP 122/76   Pulse 63   Ht 6\' 1"  (1.854 m)   Wt 251 lb (113.9 kg)   BMI 33.12 kg/m  General:   Pleasant, well developed male in no acute distress Head:   Normocephalic and atraumatic. Eyes:  sclerae anicteric,conjunctive pink  Heart:   regular rate and rhythm Pulm:  Clear anteriorly; no wheezing Abdomen:   Soft, Obese AB, Active bowel sounds. mild  tenderness in the entire abdomen. With guarding and Without rebound, No organomegaly appreciated. Extremities:  Without edema. Msk: Symmetrical without gross deformities. Peripheral pulses intact.  Neurologic:  Alert and  oriented x4;  No focal deficits.  Skin:   Dry and intact without significant lesions or rashes. Psychiatric:  Cooperative. Normal mood and affect.  Doree Albee, PA-C 10:51 AM

## 2022-11-10 ENCOUNTER — Encounter: Payer: Self-pay | Admitting: Physician Assistant

## 2022-11-10 ENCOUNTER — Ambulatory Visit: Payer: Medicare PPO | Admitting: Physician Assistant

## 2022-11-10 VITALS — BP 122/76 | HR 63 | Ht 73.0 in | Wt 251.0 lb

## 2022-11-10 DIAGNOSIS — K581 Irritable bowel syndrome with constipation: Secondary | ICD-10-CM

## 2022-11-10 DIAGNOSIS — Z86010 Personal history of colonic polyps: Secondary | ICD-10-CM | POA: Diagnosis not present

## 2022-11-10 DIAGNOSIS — K5730 Diverticulosis of large intestine without perforation or abscess without bleeding: Secondary | ICD-10-CM | POA: Diagnosis not present

## 2022-11-10 MED ORDER — LINACLOTIDE 145 MCG PO CAPS: 145.0000 ug | ORAL_CAPSULE | Freq: Every day | ORAL | 3 refills | Status: DC

## 2022-11-10 NOTE — Progress Notes (Signed)
Agree with assessment and plan as outlined.  

## 2022-11-10 NOTE — Patient Instructions (Addendum)
Plase call in a few months to make a 42 year follow up.  Linzess 72 mcg but can go up to the 145 mcg *IBS-C patients may begin to experience relief from belly pain and overall abdominal symptoms (pain, discomfort, and bloating) in about 1 week,  with symptoms typically improving over 12 weeks.  Take at least 30 minutes before the first meal of the day on an empty stomach You can have a loose stool if you eat a high-fat breakfast. Give it at least 7 days, may have more bowel movements during that time.   The diarrhea should go away and you should start having normal, complete, full bowel movements.  It may be helpful to start treatment when you can be near the comfort of your own bathroom, such as a weekend.  After you are out we can send in a prescription if you did well, there is a prescription card  First do a trial off milk/lactose products if you use them.  Add fiber like benefiber or citracel once a day Increase activity if any worsening symptoms like blood in stool, weight loss, please call the office   Please try low FODMAP diet- see below- start with eliminating just one column at a time, the table at the very bottom contains foods that are safe to take   FODMAP stands for fermentable oligo-, di-, mono-saccharides and polyols (1). These are the scientific terms used to classify groups of carbs that are notorious for triggering digestive symptoms like bloating, gas and stomach pain.     Diverticulosis Diverticulosis is a condition that develops when small pouches (diverticula) form in the wall of the large intestine (colon). The colon is where water is absorbed and stool (feces) is formed. The pouches form when the inside layer of the colon pushes through weak spots in the outer layers of the colon. You may have a few pouches or many of them. The pouches usually do not cause problems unless they become inflamed or infected. When this happens, the condition is called diverticulitis-  this is left lower quadrant pain, diarrhea, fever, chills, nausea or vomiting.  If this occurs please call the office or go to the hospital. Sometimes these patches without inflammation can also have painless bleeding associated with them, if this happens please call the office or go to the hospital. Preventing constipation and increasing fiber can help reduce diverticula and prevent complications. Even if you feel you have a high-fiber diet, suggest getting on Benefiber or Cirtracel 2 times daily.   _______________________________________________________  If your blood pressure at your visit was 140/90 or greater, please contact your primary care physician to follow up on this.  _______________________________________________________  If you are age 42 or older, your body mass index should be between 23-30. Your Body mass index is 33.12 kg/m. If this is out of the aforementioned range listed, please consider follow up with your Primary Care Provider.  If you are age 42 or younger, your body mass index should be between 19-25. Your Body mass index is 33.12 kg/m. If this is out of the aformentioned range listed, please consider follow up with your Primary Care Provider.   ________________________________________________________  The [place] GI providers would like to encourage you to use [facility] to communicate with providers for non-urgent requests or questions.  Due to long hold times on the telephone, sending your provider a message by [facility] may be a faster and more efficient way to get a response.  Please allow 48 business  hours for a response.  Please remember that this is for non-urgent requests.  _______________________________________________________ It was a pleasure to see you today!  Thank you for trusting me with your gastrointestinal care!

## 2022-12-09 ENCOUNTER — Encounter: Payer: Self-pay | Admitting: Nurse Practitioner

## 2022-12-09 ENCOUNTER — Ambulatory Visit (INDEPENDENT_AMBULATORY_CARE_PROVIDER_SITE_OTHER): Payer: Medicare PPO | Admitting: Nurse Practitioner

## 2022-12-09 VITALS — BP 120/60 | HR 63 | Temp 98.4°F | Ht 73.0 in | Wt 250.4 lb

## 2022-12-09 DIAGNOSIS — R5383 Other fatigue: Secondary | ICD-10-CM | POA: Diagnosis not present

## 2022-12-09 DIAGNOSIS — E782 Mixed hyperlipidemia: Secondary | ICD-10-CM

## 2022-12-09 DIAGNOSIS — R7303 Prediabetes: Secondary | ICD-10-CM

## 2022-12-09 DIAGNOSIS — R0683 Snoring: Secondary | ICD-10-CM | POA: Insufficient documentation

## 2022-12-09 DIAGNOSIS — F2089 Other schizophrenia: Secondary | ICD-10-CM | POA: Diagnosis not present

## 2022-12-09 DIAGNOSIS — Z8619 Personal history of other infectious and parasitic diseases: Secondary | ICD-10-CM

## 2022-12-09 DIAGNOSIS — R220 Localized swelling, mass and lump, head: Secondary | ICD-10-CM

## 2022-12-09 DIAGNOSIS — Z79899 Other long term (current) drug therapy: Secondary | ICD-10-CM | POA: Diagnosis not present

## 2022-12-09 MED ORDER — VALACYCLOVIR HCL 500 MG PO TABS: 500.0000 mg | ORAL_TABLET | Freq: Every day | ORAL | 1 refills | Status: AC

## 2022-12-09 NOTE — Progress Notes (Signed)
Madelaine Bhat, CMA,acting as a Neurosurgeon for Arnette Felts, FNP.,have documented all relevant documentation on the behalf of Arnette Felts, FNP,as directed by  Arnette Felts, FNP while in the presence of Arnette Felts, FNP.  Subjective:  Patient ID: Scott Farrell , male    DOB: [birth year] , 42 y.o.   MRN: [id number]  Chief Complaint  Patient presents with   Hyperlipidemia   Diabetes    HPI  Patient presents today for a chol and prediabetes follow up. Patient reports compliance with medications. Patient has no concerns today, patient denies any chest pain, SOB, or headaches. 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 He would like to have some valtrex.   BP Readings from Last 3 Encounters: 12/09/22 : 120/60 11/10/22 : 122/76 08/14/22 : 116/60      Past Medical History:  Diagnosis Date   Clotting disorder (HCC)    personal hx/o? etiology unclear   Costochondritis    history of   Diverticulitis    Herpes genitalia 06/2010   History of echocardiogram 01/07/2011   normal LV function, EF 60%, trace mitral, tricuspid and pulmonic regurgitation; Dr. Viann Fish   IBS (irritable bowel syndrome)    Mental disorder 2013   hospitalized for psychiatric illness prior   Normal cardiac stress test 01/07/2011   negative treadmill test, no evidence of ischemia, good exercise capacity; Viann Fish, MD;    Prostatitis 11/2010   Dr. Vernie Ammons   Renal stone 06/2012   Urology consult   Schizophrenia [facility])    [facility], [facility] - Dr. Marga Melnick   Tobacco use disorder      Family History  Problem Relation Age of Onset   Hypertension Mother    Stroke Mother    Depression Mother    Acne Mother    Thyroid disease Mother    Diabetes Mother    Colon cancer Mother 28   Acute lymphoblastic leukemia Father    Diabetes Father    Cancer Brother    Breast cancer Maternal Aunt    Ovarian cancer Maternal Aunt    Esophageal cancer Neg Hx    Stomach cancer Neg Hx       Current Outpatient Medications:    atorvastatin (LIPITOR) 10 MG tablet, Take 1 tablet (10 mg total) by mouth daily., Disp: 30 tablet, Rfl: 11   divalproex (DEPAKOTE) 500 MG DR tablet, Take 500 mg by mouth at bedtime., Disp: , Rfl:    linaclotide (LINZESS) 145 MCG CAPS capsule, Take 1 capsule (145 mcg total) by mouth daily before breakfast., Disp: 90 capsule, Rfl: 3   risperidone (RISPERDAL) 4 MG tablet, Take 4 mg by mouth 2 (two) times daily., Disp: , Rfl:    valACYclovir (VALTREX) 500 MG tablet, Take 1 tablet (500 mg total) by mouth daily., Disp: 90 tablet, Rfl: 1   Allergies  Allergen Reactions   Metformin And Related Other (See Comments)    Chest tightness     Review of Systems  Constitutional: Negative.   Respiratory: Negative.    Cardiovascular: Negative.   Gastrointestinal: Negative.   Musculoskeletal:        Bilateral foot pain - seen by podiatry  Neurological: Negative.   Psychiatric/Behavioral: Negative.       Today's Vitals   12/09/22 1407  BP: 120/60  Pulse: 63  Temp: 98.4 F (36.9 C)  TempSrc: Oral  Weight: 250 lb 6.4 oz (113.6 kg)  Height: 6\' 1"  (1.854 m)  PainSc: 0-No pain  Body mass index is 33.04 kg/m.  Wt Readings from Last 3 Encounters:  12/09/22 250 lb 6.4 oz (113.6 kg)  11/10/22 251 lb (113.9 kg)  08/14/22 250 lb 6 oz (113.6 kg)     Objective:  Physical Exam Vitals reviewed.  Constitutional:      General: He is not in acute distress.    Appearance: Normal appearance. He is obese.  HENT:     Head: Normocephalic.  Cardiovascular:     Rate and Rhythm: Normal rate and regular rhythm.     Pulses: Normal pulses.     Heart sounds: Normal heart sounds. No murmur heard. Pulmonary:     Effort: Pulmonary effort is normal. No respiratory distress.     Breath sounds: Normal breath sounds. No wheezing.  Musculoskeletal:        General: Normal range of motion.  Skin:    General: Skin is warm and dry.     Capillary Refill: Capillary refill  takes less than 2 seconds.     Comments: Mass to right side of forehead  Neurological:     General: No focal deficit present.     Mental Status: He is alert and oriented to person, place, and time.     Cranial Nerves: No cranial nerve deficit.     Motor: No weakness.  Psychiatric:        Mood and Affect: Mood normal.        Behavior: Behavior normal.        Thought Content: Thought content normal.        Judgment: Judgment normal.         Assessment And Plan:  Mixed hyperlipidemia Assessment & Plan: Cholesterol levels have improved.  Continue focusing on low-fat diet and taking statin.  Tolerating well.  Orders: -     Lipid panel  Prediabetes Assessment & Plan: Hemoglobin A1c is stable.  Orders: -     Hemoglobin A1c  Snoring Assessment & Plan: Due to history of snoring will refer to for sleep study.  Orders: -     Ambulatory referral to Sleep Studies  Other fatigue Assessment & Plan: Will check for metabolic causes.  Will also refer for sleep study.  This has been an ongoing issue.  Orders: -     TSH -     Vitamin B12 -     VITAMIN D 25 Hydroxy (Vit-D Deficiency, Fractures) -     Iron, TIBC and Ferritin Panel -     Ambulatory referral to Sleep Studies  Subcutaneous nodule of head Assessment & Plan: He was advised by dermatology he will need a referral to plastics or surgeon per notes a referral was made he is to follow-up on this and let me know if we need to make the referral.   Other schizophrenia [facility]) Assessment & Plan: Continue follow-up with behavioral health.  No recent changes per patient.   History of herpes genitalis Assessment & Plan: Sent prescription for valacyclovir as a preventative measure.  Orders: -     valACYclovir HCl; Take 1 tablet (500 mg total) by mouth daily.  Dispense: 90 tablet; Refill: 1    Return for 6 month chol check.  Patient was given opportunity to ask questions. Patient verbalized understanding of the plan and was  able to repeat key elements of the plan. All questions were answered to their satisfaction.    Jeanell Sparrow, FNP, have reviewed all documentation for this visit. The documentation on 12/09/22 for the exam,  diagnosis, procedures, and orders are all accurate and complete.   IF YOU HAVE BEEN REFERRED TO A SPECIALIST, IT MAY TAKE 1-2 WEEKS TO SCHEDULE/PROCESS THE REFERRAL. IF YOU HAVE NOT HEARD FROM US/SPECIALIST IN TWO WEEKS, PLEASE GIVE [place] A CALL AT [phone].

## 2022-12-10 LAB — HEMOGLOBIN A1C
Est. average glucose Bld gHb Est-mCnc: 128 mg/dL
Hgb A1c MFr Bld: 6.1 % — ABNORMAL HIGH (ref 4.8–5.6)

## 2022-12-10 LAB — VITAMIN B12: Vitamin B-12: 680 pg/mL (ref 232–1245)

## 2022-12-10 LAB — LIPID PANEL
Chol/HDL Ratio: 3.8 ratio (ref 0.0–5.0)
Cholesterol, Total: 103 mg/dL (ref 100–199)
HDL: 27 mg/dL — ABNORMAL LOW (ref >39)
LDL Chol Calc (NIH): 47 mg/dL (ref 0–99)
Triglycerides: 169 mg/dL — ABNORMAL HIGH (ref 0–149)
VLDL Cholesterol Cal: 29 mg/dL (ref 5–40)

## 2022-12-10 LAB — IRON,TIBC AND FERRITIN PANEL
Ferritin: 292 ng/mL (ref 30–400)
Iron Saturation: 25 % (ref 15–55)
Iron: 92 ug/dL (ref 38–169)
Total Iron Binding Capacity: 361 ug/dL (ref 250–450)
UIBC: 269 ug/dL (ref 111–343)

## 2022-12-10 LAB — TSH: TSH: 2.25 u[IU]/mL (ref 0.450–4.500)

## 2022-12-10 LAB — VITAMIN D 25 HYDROXY (VIT D DEFICIENCY, FRACTURES): Vit D, 25-Hydroxy: 29.7 ng/mL — ABNORMAL LOW (ref 30.0–100.0)

## 2022-12-13 DIAGNOSIS — Z8619 Personal history of other infectious and parasitic diseases: Secondary | ICD-10-CM | POA: Insufficient documentation

## 2022-12-13 NOTE — Assessment & Plan Note (Signed)
Cholesterol levels have improved.  Continue focusing on low-fat diet and taking statin.  Tolerating well.

## 2022-12-13 NOTE — Assessment & Plan Note (Signed)
He was advised by dermatology he will need a referral to plastics or surgeon per notes a referral was made he is to follow-up on this and let me know if we need to make the referral.

## 2022-12-13 NOTE — Assessment & Plan Note (Signed)
Will check for metabolic causes.  Will also refer for sleep study.  This has been an ongoing issue.

## 2022-12-13 NOTE — Assessment & Plan Note (Signed)
Continue follow-up with behavioral health.  No recent changes per patient.

## 2022-12-13 NOTE — Assessment & Plan Note (Signed)
Due to history of snoring will refer to for sleep study.

## 2022-12-13 NOTE — Assessment & Plan Note (Signed)
Sent prescription for valacyclovir as a preventative measure.

## 2022-12-13 NOTE — Assessment & Plan Note (Signed)
Hemoglobin A1c is stable

## 2023-01-12 DIAGNOSIS — D170 Benign lipomatous neoplasm of skin and subcutaneous tissue of head, face and neck: Secondary | ICD-10-CM | POA: Diagnosis not present

## 2023-01-12 DIAGNOSIS — M952 Other acquired deformity of head: Secondary | ICD-10-CM | POA: Diagnosis not present

## 2023-01-12 DIAGNOSIS — F209 Schizophrenia, unspecified: Secondary | ICD-10-CM | POA: Diagnosis not present

## 2023-01-14 DIAGNOSIS — F251 Schizoaffective disorder, depressive type: Secondary | ICD-10-CM | POA: Diagnosis not present

## 2023-02-05 ENCOUNTER — Other Ambulatory Visit: Payer: Self-pay

## 2023-03-04 ENCOUNTER — Other Ambulatory Visit: Payer: Self-pay | Admitting: Otolaryngology

## 2023-03-09 NOTE — Pre-Procedure Instructions (Signed)
Surgical Instructions    Your procedure is scheduled on Friday, October 11th.   Report to [facility] Main Entrance "A" at 0800 A.M., then check in with the Admitting office.  Call this number if you have problems the morning of surgery:  [phone]  If you have any questions prior to your surgery date call [phone]: Open Monday-Friday 8am-4pm If you experience any cold or flu symptoms such as cough, fever, chills, shortness of breath, etc. between now and your scheduled surgery, please notify us at the above number.     Remember:  Do not eat after midnight the night before your surgery  You may drink clear liquids until 0700 AM the morning of your surgery.   Clear liquids allowed are: Water, Non-Citrus Juices (without pulp), Carbonated Beverages, Clear Tea, Black Coffee Only (NO MILK, CREAM OR POWDERED CREAMER of any kind), and Gatorade.    Take these medicines the morning of surgery with A SIP OF WATER :              atorvastatin (LIPITOR)              risperidone (RISPERDAL)              preovalACYclovir (VALTREX) as needed  As of today, STOP taking any Aspirin (unless otherwise instructed by your surgeon) Aleve, Naproxen, Ibuprofen, Motrin, Advil, Goody's, BC's, all herbal medications, fish oil, and all vitamins.                     Do NOT Smoke (Tobacco/Vaping) for 24 hours prior to your procedure.  If you use a CPAP at night, you may bring your mask/headgear for your overnight stay.   Contacts, glasses, piercing's, hearing aid's, dentures or partials may not be worn into surgery, please bring cases for these belongings.    For patients admitted to the hospital, discharge time will be determined by your treatment team.   Patients discharged the day of surgery will not be allowed to drive home, and someone needs to stay with them for 24 hours.  SURGICAL WAITING ROOM VISITATION Patients having surgery or a procedure may have no more than 2 support people in the waiting  area - these visitors may rotate.   Children under the age of 82 must have an adult with them who is not the patient. If the patient needs to stay at the hospital during part of their recovery, the visitor guidelines for inpatient rooms apply. Pre-op nurse will coordinate an appropriate time for 1 support person to accompany patient in pre-op.  This support person may not rotate.   Please refer to the [facility] website for the visitor guidelines for Inpatients (after your surgery is over and you are in a regular room).    Special instructions:   [facility]- Preparing For Surgery  Before surgery, you can play an important role. Because skin is not sterile, your skin needs to be as free of germs as possible. You can reduce the number of germs on your skin by washing with CHG (chlorahexidine gluconate) Soap before surgery.  CHG is an antiseptic cleaner which kills germs and bonds with the skin to continue killing germs even after washing.    Oral Hygiene is also important to reduce your risk of infection.  Remember - BRUSH YOUR TEETH THE MORNING OF SURGERY WITH YOUR REGULAR TOOTHPASTE  Please do not use if you have an allergy to CHG or antibacterial soaps. If your skin becomes reddened/irritated stop  using the CHG.  Do not shave (including legs and underarms) for at least 48 hours prior to first CHG shower. It is OK to shave your face.  Please follow these instructions carefully.   Shower the NIGHT BEFORE SURGERY and the MORNING OF SURGERY  If you chose to wash your hair, wash your hair first as usual with your normal shampoo.  After you shampoo, rinse your hair and body thoroughly to remove the shampoo.  Use CHG Soap as you would any other liquid soap. You can apply CHG directly to the skin and wash gently with a scrungie or a clean washcloth.   Apply the CHG Soap to your body ONLY FROM THE NECK DOWN.  Do not use on open wounds or open sores. Avoid contact with your eyes, ears, mouth and  genitals (private parts). Wash Face and genitals (private parts)  with your normal soap.   Wash thoroughly, paying special attention to the area where your surgery will be performed.  Thoroughly rinse your body with warm water from the neck down.  DO NOT shower/wash with your normal soap after using and rinsing off the CHG Soap.  Pat yourself dry with a CLEAN TOWEL.  Wear CLEAN PAJAMAS to bed the night before surgery  Place CLEAN SHEETS on your bed the night before your surgery  DO NOT SLEEP WITH PETS.   Day of Surgery: Take a shower with CHG soap. Do not wear jewelry or makeup Do not wear lotions, powders, perfumes/colognes, or deodorant. Do not shave 48 hours prior to surgery.  Men may shave face and neck. Do not bring valuables to the hospital.  [facility] is not responsible for any belongings or valuables. Do not wear nail polish, gel polish, artificial nails, or any other type of covering on natural nails (fingers and toes) If you have artificial nails or gel coating that need to be removed by a nail salon, please have this removed prior to surgery. Artificial nails or gel coating may interfere with anesthesia's ability to adequately monitor your vital signs. Wear Clean/Comfortable clothing the morning of surgery Remember to brush your teeth WITH YOUR REGULAR TOOTHPASTE.   Please read over the following fact sheets that you were given.    If you received a COVID test during your pre-op visit  it is requested that you wear a mask when out in public, stay away from anyone that may not be feeling well and notify your surgeon if you develop symptoms. If you have been in contact with anyone that has tested positive in the last 10 days please notify you surgeon.

## 2023-03-10 ENCOUNTER — Encounter (HOSPITAL_COMMUNITY)
Admission: RE | Admit: 2023-03-10 | Discharge: 2023-03-10 | Disposition: A | Discharge status: Home / Self Care | Payer: Medicare PPO | Source: Ambulatory Visit | Attending: Otolaryngology | Admitting: Otolaryngology

## 2023-03-10 ENCOUNTER — Encounter (HOSPITAL_COMMUNITY): Payer: Self-pay

## 2023-03-10 ENCOUNTER — Other Ambulatory Visit: Payer: Self-pay

## 2023-03-10 VITALS — BP 120/76 | HR 73 | Temp 98.0°F | Resp 19 | Ht 77.5 in | Wt 250.7 lb

## 2023-03-10 DIAGNOSIS — F17210 Nicotine dependence, cigarettes, uncomplicated: Secondary | ICD-10-CM | POA: Insufficient documentation

## 2023-03-10 DIAGNOSIS — Z01812 Encounter for preprocedural laboratory examination: Secondary | ICD-10-CM | POA: Diagnosis not present

## 2023-03-10 DIAGNOSIS — R7303 Prediabetes: Secondary | ICD-10-CM | POA: Diagnosis not present

## 2023-03-10 DIAGNOSIS — Z01818 Encounter for other preprocedural examination: Secondary | ICD-10-CM

## 2023-03-10 HISTORY — DX: Prediabetes: R73.03

## 2023-03-10 LAB — CBC
HCT: 41.7 % (ref 39.0–52.0)
Hemoglobin: 14 g/dL (ref 13.0–17.0)
MCH: 29.9 pg (ref 26.0–34.0)
MCHC: 33.6 g/dL (ref 30.0–36.0)
MCV: 88.9 fL (ref 80.0–100.0)
Platelets: 184 10*3/uL (ref 150–400)
RBC: 4.69 MIL/uL (ref 4.22–5.81)
RDW: 14.6 % (ref 11.5–15.5)
WBC: 6.7 10*3/uL (ref 4.0–10.5)
nRBC: 0 % (ref 0.0–0.2)

## 2023-03-10 LAB — NO BLOOD PRODUCTS

## 2023-03-10 NOTE — Progress Notes (Signed)
PCP - Arnette Felts, FNP Cardiologist -   PPM/ICD - denies Device Orders - na Rep Notified - na  Chest x-ray - na EKG - na Stress Test -  ECHO - 11/21/2020 Cardiac Cath - 01/07/2011  Sleep Study - denies CPAP - na  Non-diabetic  Blood Thinner Instructions: denies Aspirin Instructions:denies  ERAS Protcol - Yes, clear fluids until 0700  COVID TEST- na  Anesthesia review: Yes. Clotting disorder, high cholesterol, hx of echo and stress test. A1C 6.1 on 12/09/2022  Patient denies shortness of breath, fever, cough and chest pain at PAT appointment   All instructions explained to the patient, with a verbal understanding of the material. Patient agrees to go over the instructions while at home for a better understanding. Patient also instructed to self quarantine after being tested for COVID-19. The opportunity to ask questions was provided.

## 2023-03-11 NOTE — Progress Notes (Signed)
Anesthesia Chart Review: Same-day workup  42 year old male with pertinent history including current smoker, history of noncardiac chest pain (felt to be costochondritis), prediabetes, schizophrenia.  "Clotting disorder" is listed in patient history.  There is very little detail about this.  Review of records shows that this appears to have been added during an ED visit on 06/04/2012 when the patient was seen for hematuria with blood clots secondary to traumatic catheterization.  I can find no other evidence in his history of true clotting disorder.  Patient was evaluated by cardiologist Dr. Izora Ribas in April 2022 for atypical chest pain.  EKG was notable for ST elevation in anterior leads without reciprocal depression and was consistent with normal variant.  Echocardiogram was benign, EF 60 to 65%, normal wall motion, normal valves.  He was advised to follow-up with cardiology on an as-needed basis if chest pain returns.  CBC 03/10/2023 reviewed, WNL.  Patient refuses blood products.  TTE 11/21/2020:  1. Left ventricular ejection fraction, by estimation, is 60 to 65%. The  left ventricle has normal function. The left ventricle has no regional  wall motion abnormalities. Left ventricular diastolic parameters were  normal.   2. Right ventricular systolic function is normal. The right ventricular  size is mildly enlarged. There is normal pulmonary artery systolic  pressure.   3. Right atrial size was mildly dilated.   4. The mitral valve is normal in structure. Trivial mitral valve  regurgitation. No evidence of mitral stenosis.   5. The aortic valve is grossly normal. Aortic valve regurgitation is not  visualized. No aortic stenosis is present.   6. The inferior vena cava is normal in size with greater than 50%  respiratory variability, suggesting right atrial pressure of 3 mmHg.   Comparison(s): No prior Echocardiogram.   Conclusion(s)/Recommendation(s): Normal biventricular function  without  evidence of hemodynamically significant valvular heart disease.     Zannie Cove [facility] Short Stay Center/Anesthesiology Phone [phone] 03/11/2023 9:35 AM

## 2023-03-11 NOTE — Anesthesia Preprocedure Evaluation (Addendum)
Anesthesia Evaluation  Patient identified by MRN, date of birth, ID band Patient awake    Reviewed: Allergy & Precautions, H&P , NPO status , Patient's Chart, lab work & pertinent test results, reviewed documented beta blocker date and time   Airway Mallampati: II  TM Distance: >3 FB Neck ROM: full    Dental no notable dental hx. (+) Teeth Intact, Dental Advisory Given,    Pulmonary Current Smoker and Patient abstained from smoking.   Pulmonary exam normal breath sounds clear to auscultation       Cardiovascular Exercise Tolerance: Good negative cardio ROS  Rhythm:regular Rate:Normal     Neuro/Psych  Headaches PSYCHIATRIC DISORDERS    Schizophrenia   Neuromuscular disease negative neurological ROS  negative psych ROS   GI/Hepatic negative GI ROS, Neg liver ROS,,,  Endo/Other  negative endocrine ROS    Renal/GU Renal diseasenegative Renal ROS  negative genitourinary   Musculoskeletal   Abdominal   Peds  Hematology negative hematology ROS (+)   Anesthesia Other Findings   Reproductive/Obstetrics negative OB ROS                             Anesthesia Physical Anesthesia Plan  ASA: 3  Anesthesia Plan: MAC   Post-op Pain Management: Minimal or no pain anticipated, Tylenol PO (pre-op)* and Celebrex PO (pre-op)*   Induction: Intravenous  PONV Risk Score and Plan:   Airway Management Planned: Natural Airway and Nasal Cannula  Additional Equipment: None  Intra-op Plan:   Post-operative Plan:   Informed Consent: I have reviewed the patients History and Physical, chart, labs and discussed the procedure including the risks, benefits and alternatives for the proposed anesthesia with the patient or authorized representative who has indicated his/her understanding and acceptance.     Dental Advisory Given  Plan Discussed with: CRNA and Anesthesiologist  Anesthesia Plan Comments: (PAT  note by Antionette Poles, PA-C:  42 year old male with pertinent history including current smoker, history of noncardiac chest pain (felt to be costochondritis), prediabetes, schizophrenia.  "Clotting disorder" is listed in patient history.  There is very little detail about this.  Review of records shows that this appears to have been added during an ED visit on 06/04/2012 when the patient was seen for hematuria with blood clots secondary to traumatic catheterization.  I can find no other evidence in his history of true clotting disorder.  Patient was evaluated by cardiologist Dr. Izora Ribas in April 2022 for atypical chest pain.  EKG was notable for ST elevation in anterior leads without reciprocal depression and was consistent with normal variant.  Echocardiogram was benign, EF 60 to 65%, normal wall motion, normal valves.  He was advised to follow-up with cardiology on an as-needed basis if chest pain returns.  CBC 03/10/2023 reviewed, WNL.  Patient refuses blood products.  TTE 11/21/2020: 1. Left ventricular ejection fraction, by estimation, is 60 to 65%. The  left ventricle has normal function. The left ventricle has no regional  wall motion abnormalities. Left ventricular diastolic parameters were  normal.  2. Right ventricular systolic function is normal. The right ventricular  size is mildly enlarged. There is normal pulmonary artery systolic  pressure.  3. Right atrial size was mildly dilated.  4. The mitral valve is normal in structure. Trivial mitral valve  regurgitation. No evidence of mitral stenosis.  5. The aortic valve is grossly normal. Aortic valve regurgitation is not  visualized. No aortic stenosis is present.  6.  The inferior vena cava is normal in size with greater than 50%  respiratory variability, suggesting right atrial pressure of 3 mmHg.   Comparison(s): No prior Echocardiogram.   Conclusion(s)/Recommendation(s): Normal biventricular function without   evidence of hemodynamically significant valvular heart disease.    )        Anesthesia Quick Evaluation

## 2023-03-12 ENCOUNTER — Encounter (HOSPITAL_COMMUNITY): Payer: Self-pay | Admitting: Otolaryngology

## 2023-03-12 ENCOUNTER — Ambulatory Visit (HOSPITAL_COMMUNITY): Payer: Medicare PPO | Admitting: Anesthesiology

## 2023-03-12 ENCOUNTER — Ambulatory Visit (HOSPITAL_COMMUNITY): Payer: Medicare PPO | Admitting: Physician Assistant

## 2023-03-12 ENCOUNTER — Encounter (HOSPITAL_COMMUNITY)
Admission: RE | Disposition: A | Discharge status: Home / Self Care | Payer: Self-pay | Source: Home / Self Care | Attending: Otolaryngology

## 2023-03-12 ENCOUNTER — Ambulatory Visit (HOSPITAL_COMMUNITY)
Admission: RE | Admit: 2023-03-12 | Discharge: 2023-03-12 | Disposition: A | Discharge status: Home / Self Care | Payer: Medicare PPO | Attending: Otolaryngology | Admitting: Otolaryngology

## 2023-03-12 ENCOUNTER — Other Ambulatory Visit: Payer: Self-pay

## 2023-03-12 DIAGNOSIS — F209 Schizophrenia, unspecified: Secondary | ICD-10-CM | POA: Diagnosis not present

## 2023-03-12 DIAGNOSIS — M952 Other acquired deformity of head: Secondary | ICD-10-CM | POA: Diagnosis not present

## 2023-03-12 DIAGNOSIS — F17210 Nicotine dependence, cigarettes, uncomplicated: Secondary | ICD-10-CM | POA: Insufficient documentation

## 2023-03-12 DIAGNOSIS — F17290 Nicotine dependence, other tobacco product, uncomplicated: Secondary | ICD-10-CM | POA: Insufficient documentation

## 2023-03-12 DIAGNOSIS — R220 Localized swelling, mass and lump, head: Secondary | ICD-10-CM

## 2023-03-12 DIAGNOSIS — D170 Benign lipomatous neoplasm of skin and subcutaneous tissue of head, face and neck: Secondary | ICD-10-CM

## 2023-03-12 HISTORY — PX: EXCISION MASS HEAD: SHX6702

## 2023-03-12 SURGERY — EXCISION, MASS, HEAD
Anesthesia: Monitor Anesthesia Care | Site: Head | Laterality: Right

## 2023-03-12 MED ORDER — ACETAMINOPHEN 500 MG PO TABS: 500.0000 mg | ORAL_TABLET | Freq: Four times a day (QID) | ORAL | 0 refills | Status: AC | PRN

## 2023-03-12 MED ORDER — BACITRACIN ZINC 500 UNIT/GM EX OINT
TOPICAL_OINTMENT | CUTANEOUS | Status: DC | PRN
Start: 2023-03-12 — End: 2023-03-12
  Administered 2023-03-12: 1 via TOPICAL

## 2023-03-12 MED ORDER — ACETAMINOPHEN 325 MG PO TABS: 325.0000 mg | ORAL_TABLET | ORAL | Status: DC | PRN

## 2023-03-12 MED ORDER — ONDANSETRON HCL 4 MG/2ML IJ SOLN
INTRAMUSCULAR | Status: DC | PRN
  Administered 2023-03-12: 4 mg via INTRAVENOUS

## 2023-03-12 MED ORDER — ACETAMINOPHEN 160 MG/5ML PO SOLN: 325.0000 mg | ORAL | Status: DC | PRN

## 2023-03-12 MED ORDER — FENTANYL CITRATE (PF) 250 MCG/5ML IJ SOLN
INTRAMUSCULAR | Status: DC | PRN
  Administered 2023-03-12: 25 ug via INTRAVENOUS
  Administered 2023-03-12 (×2): 50 ug via INTRAVENOUS
  Administered 2023-03-12: 25 ug via INTRAVENOUS

## 2023-03-12 MED ORDER — PROPOFOL 10 MG/ML IV BOLUS
INTRAVENOUS | Status: AC
  Filled 2023-03-12: qty 20

## 2023-03-12 MED ORDER — 0.9 % SODIUM CHLORIDE (POUR BTL) OPTIME
TOPICAL | Status: DC | PRN
  Administered 2023-03-12: 1000 mL

## 2023-03-12 MED ORDER — CEFAZOLIN SODIUM-DEXTROSE 2-4 GM/100ML-% IV SOLN
INTRAVENOUS | Status: AC
  Filled 2023-03-12: qty 100

## 2023-03-12 MED ORDER — LIDOCAINE-EPINEPHRINE 1 %-1:100000 IJ SOLN
INTRAMUSCULAR | Status: AC
  Filled 2023-03-12: qty 1

## 2023-03-12 MED ORDER — MEPERIDINE HCL 25 MG/ML IJ SOLN: 6.2500 mg | INTRAMUSCULAR | Status: DC | PRN

## 2023-03-12 MED ORDER — LACTATED RINGERS IV SOLN: INTRAVENOUS | Status: DC

## 2023-03-12 MED ORDER — BACITRACIN ZINC 500 UNIT/GM EX OINT
TOPICAL_OINTMENT | CUTANEOUS | Status: AC
  Filled 2023-03-12: qty 28.35

## 2023-03-12 MED ORDER — SODIUM BICARBONATE 8.4 % IV SOLN
INTRAVENOUS | Status: DC | PRN
Start: 2023-03-12 — End: 2023-03-12
  Administered 2023-03-12: 1 mL

## 2023-03-12 MED ORDER — ORAL CARE MOUTH RINSE: 15.0000 mL | Freq: Once | OROMUCOSAL | Status: AC

## 2023-03-12 MED ORDER — OXYCODONE HCL 5 MG PO TABS: 5.0000 mg | ORAL_TABLET | Freq: Once | ORAL | Status: DC | PRN

## 2023-03-12 MED ORDER — FENTANYL CITRATE (PF) 250 MCG/5ML IJ SOLN
INTRAMUSCULAR | Status: AC
  Filled 2023-03-12: qty 5

## 2023-03-12 MED ORDER — DEXMEDETOMIDINE HCL IN NACL 200 MCG/50ML IV SOLN
INTRAVENOUS | Status: DC | PRN
Start: 2023-03-12 — End: 2023-03-12
  Administered 2023-03-12 (×3): 10 ug via INTRAVENOUS

## 2023-03-12 MED ORDER — IBUPROFEN 200 MG PO TABS: 400.0000 mg | ORAL_TABLET | Freq: Four times a day (QID) | ORAL | 0 refills | Status: AC | PRN

## 2023-03-12 MED ORDER — CHLORHEXIDINE GLUCONATE 0.12 % MT SOLN
15.0000 mL | Freq: Once | OROMUCOSAL | Status: AC
  Administered 2023-03-12: 15 mL via OROMUCOSAL

## 2023-03-12 MED ORDER — LIDOCAINE-EPINEPHRINE 1 %-1:100000 IJ SOLN
INTRAMUSCULAR | Status: DC | PRN
  Administered 2023-03-12: 5 mL

## 2023-03-12 MED ORDER — SODIUM BICARBONATE 8.4 % IV SOLN
INTRAVENOUS | Status: AC
  Filled 2023-03-12: qty 50

## 2023-03-12 MED ORDER — ACETAMINOPHEN 500 MG PO TABS
1000.0000 mg | ORAL_TABLET | Freq: Once | ORAL | Status: AC
  Administered 2023-03-12: 1000 mg via ORAL
  Filled 2023-03-12: qty 2

## 2023-03-12 MED ORDER — CHLORHEXIDINE GLUCONATE 0.12 % MT SOLN
OROMUCOSAL | Status: AC
  Filled 2023-03-12: qty 15

## 2023-03-12 MED ORDER — MIDAZOLAM HCL 2 MG/2ML IJ SOLN
INTRAMUSCULAR | Status: DC | PRN
  Administered 2023-03-12: 2 mg via INTRAVENOUS

## 2023-03-12 MED ORDER — PROPOFOL 10 MG/ML IV BOLUS
INTRAVENOUS | Status: DC | PRN
  Administered 2023-03-12 (×2): 50 mg via INTRAVENOUS
  Administered 2023-03-12 (×5): 30 mg via INTRAVENOUS

## 2023-03-12 MED ORDER — MIDAZOLAM HCL 2 MG/2ML IJ SOLN
INTRAMUSCULAR | Status: AC
  Filled 2023-03-12: qty 2

## 2023-03-12 MED ORDER — FENTANYL CITRATE (PF) 100 MCG/2ML IJ SOLN: 25.0000 ug | INTRAMUSCULAR | Status: DC | PRN

## 2023-03-12 MED ORDER — ONDANSETRON HCL 4 MG/2ML IJ SOLN: 4.0000 mg | Freq: Once | INTRAMUSCULAR | Status: DC | PRN

## 2023-03-12 MED ORDER — DEXAMETHASONE SODIUM PHOSPHATE 10 MG/ML IJ SOLN
INTRAMUSCULAR | Status: DC | PRN
  Administered 2023-03-12: 10 mg via INTRAVENOUS

## 2023-03-12 MED ORDER — CEFAZOLIN SODIUM-DEXTROSE 2-4 GM/100ML-% IV SOLN
2.0000 g | INTRAVENOUS | Status: AC
  Administered 2023-03-12: 2 g via INTRAVENOUS

## 2023-03-12 MED ORDER — OXYCODONE HCL 5 MG/5ML PO SOLN: 5.0000 mg | Freq: Once | ORAL | Status: DC | PRN

## 2023-03-12 SURGICAL SUPPLY — 32 items
ADH SKN CLS APL DERMABOND .7 (GAUZE/BANDAGES/DRESSINGS) ×1
BAG COUNTER SPONGE SURGICOUNT (BAG) ×1 IMPLANT
BAG SPNG CNTER NS LX DISP (BAG) ×1
BLADE SURG 15 STRL LF DISP TIS (BLADE) IMPLANT
BLADE SURG 15 STRL SS (BLADE)
CLEANER TIP ELECTROSURG 2X2 (MISCELLANEOUS) ×1 IMPLANT
COVER SURGICAL LIGHT HANDLE (MISCELLANEOUS) ×1 IMPLANT
DERMABOND ADVANCED .7 DNX12 (GAUZE/BANDAGES/DRESSINGS) ×1 IMPLANT
DRAPE HALF SHEET 40X57 (DRAPES) IMPLANT
DRSG TELFA 3X8 NADH STRL (GAUZE/BANDAGES/DRESSINGS) IMPLANT
ELECT COATED BLADE 2.86 ST (ELECTRODE) ×1 IMPLANT
ELECT REM PT RETURN 9FT ADLT (ELECTROSURGICAL)
ELECTRODE REM PT RTRN 9FT ADLT (ELECTROSURGICAL) IMPLANT
GAUZE 4X4 16PLY ~~LOC~~+RFID DBL (SPONGE) ×1 IMPLANT
GLOVE BIO SURGEON STRL SZ7.5 (GLOVE) ×1 IMPLANT
GLOVE BIOGEL PI IND STRL 8 (GLOVE) ×1 IMPLANT
GOWN STRL REUS W/ TWL LRG LVL3 (GOWN DISPOSABLE) ×1 IMPLANT
GOWN STRL REUS W/ TWL XL LVL3 (GOWN DISPOSABLE) ×1 IMPLANT
GOWN STRL REUS W/TWL LRG LVL3 (GOWN DISPOSABLE) ×1
GOWN STRL REUS W/TWL XL LVL3 (GOWN DISPOSABLE) ×1
KIT BASIN OR (CUSTOM PROCEDURE TRAY) ×1 IMPLANT
KIT TURNOVER KIT B (KITS) ×1 IMPLANT
NDL 27GX1/2 REG BEVEL ECLIP (NEEDLE) ×1 IMPLANT
NEEDLE 27GX1/2 REG BEVEL ECLIP (NEEDLE) ×1 IMPLANT
NS IRRIG 1000ML POUR BTL (IV SOLUTION) ×1 IMPLANT
PAD ARMBOARD 7.5X6 YLW CONV (MISCELLANEOUS) ×2 IMPLANT
PENCIL SMOKE EVACUATOR (MISCELLANEOUS) ×1 IMPLANT
SUT VICRYL 4-0 PS2 18IN ABS (SUTURE) IMPLANT
SYR CONTROL 10ML LL (SYRINGE) ×1 IMPLANT
TAPE SURG TRANSPORE 1 IN (GAUZE/BANDAGES/DRESSINGS) IMPLANT
TOWEL GREEN STERILE (TOWEL DISPOSABLE) ×1 IMPLANT
TRAY ENT MC OR (CUSTOM PROCEDURE TRAY) ×1 IMPLANT

## 2023-03-12 NOTE — H&P (Signed)
Scott Farrell is an 42 y.o. male.    Chief Complaint:  Facial mass   HPI: Patient presents today for planned elective procedure.  He/she denies any interval change in history since office visit on 01/12/23.   Past Medical History:  Diagnosis Date   Clotting disorder [facility])    personal hx/o? etiology unclear   Costochondritis    history of   Diverticulitis    Herpes genitalia 06/2010   History of echocardiogram 01/07/2011   normal LV function, EF 60%, trace mitral, tricuspid and pulmonic regurgitation; Dr. Viann Fish   IBS (irritable bowel syndrome)    Mental disorder 2013   hospitalized for psychiatric illness prior   Normal cardiac stress test 01/07/2011   negative treadmill test, no evidence of ischemia, good exercise capacity; Viann Fish, MD;    Pre-diabetes    Prostatitis 11/2010   Dr. Vernie Ammons   Renal stone 06/2012   Urology consult   Schizophrenia [facility])    [facility] Psychiatry, [facility] - Dr. Marga Melnick   Tobacco use disorder     Past Surgical History:  Procedure Laterality Date   EUS  06/30/2012   Procedure: UPPER ENDOSCOPIC ULTRASOUND (EUS) LINEAR;  Surgeon: Rachael Fee, MD;  Location: [facility] ENDOSCOPY;  Service: Endoscopy;  Laterality: N/A;  radial linear   WISDOM TOOTH EXTRACTION      Family History  Problem Relation Age of Onset   Hypertension Mother    Stroke Mother    Depression Mother    Acne Mother    Thyroid disease Mother    Diabetes Mother    Colon cancer Mother 67   Acute lymphoblastic leukemia Father    Diabetes Father    Cancer Brother    Breast cancer Maternal Aunt    Ovarian cancer Maternal Aunt    Esophageal cancer Neg Hx    Stomach cancer Neg Hx     Social History:  reports that he has been smoking cigars and cigarettes. He has a 5.8 pack-year smoking history. He has never been exposed to tobacco smoke. He has never used smokeless tobacco. He reports current alcohol use. He reports that he does not currently use  drugs.  Allergies:  Allergies  Allergen Reactions   Metformin And Related Other (See Comments)    Chest tightness    Medications Prior to Admission  Medication Sig Dispense Refill   atorvastatin (LIPITOR) 10 MG tablet Take 1 tablet (10 mg total) by mouth daily. 30 tablet 11   divalproex (DEPAKOTE) 500 MG DR tablet Take 500 mg by mouth at bedtime.     risperidone (RISPERDAL) 4 MG tablet Take 4 mg by mouth 2 (two) times daily.     valACYclovir (VALTREX) 500 MG tablet Take 1 tablet (500 mg total) by mouth daily. (Patient taking differently: Take 500 mg by mouth daily as needed (Hsb 1&2).) 90 tablet 1   linaclotide (LINZESS) 145 MCG CAPS capsule Take 1 capsule (145 mcg total) by mouth daily before breakfast. (Patient not taking: Reported on 03/04/2023) 90 capsule 3    Results for orders placed or performed during the hospital encounter of 03/10/23 (from the past 48 hour(s))  CBC per protocol     Status: None   Collection Time: 03/10/23 11:16 AM  Result Value Ref Range   WBC 6.7 4.0 - 10.5 K/uL   RBC 4.69 4.22 - 5.81 MIL/uL   Hemoglobin 14.0 13.0 - 17.0 g/dL   HCT 16.1 09.6 - 04.5 %   MCV 88.9 80.0 -  100.0 fL   MCH 29.9 26.0 - 34.0 pg   MCHC 33.6 30.0 - 36.0 g/dL   RDW 51.7 61.6 - 07.3 %   Platelets 184 150 - 400 K/uL   nRBC 0.0 0.0 - 0.2 %    Comment: Performed at [facility], [address], [place], [place] [address]  No blood products     Status: None   Collection Time: 03/10/23 11:50 AM  Result Value Ref Range   Transfuse no blood products      TRANSFUSE NO BLOOD PRODUCTS, VERIFIED BY Halford Chessman, RN 10.09.24 1105 Performed at [facility], [address], [place], [place] [address]    No results found.  ROS: negative other than stated in HPI  Blood pressure 127/79, pulse (!) 51, temperature 98 F (36.7 C), temperature source Oral, resp. rate 18, height 6\' 5"  (1.956 m), weight 113.4 kg, SpO2 96%.  PHYSICAL EXAM: General: Resting comfortably in  NAD  Lungs: Non-labored respiratinos  Studies Reviewed:  MRI Brain w/wo contarst 09/01/2019  Addendum: Additionally, there is a 21 mm long subcutaneous focus that is hyperintense on T1 and T2-weighted images and hypointense on flair images.  This is most consistent with a lipoma.  It was not present in 2012. Richard A. Sater, MD. PhD    Assessment/Plan Right forehead lipoma Acquired facial deformity  Proceed with excision of right facial tumor under MAC anesthesia. Informed consent obtained. RBA discussed.     Electronically signed by:  Scarlette Ar, MD  Staff Physician Facial Plastic & Reconstructive Surgery Otolaryngology - Head and Neck Surgery [facility]  03/12/2023, 9:53 AM

## 2023-03-12 NOTE — Anesthesia Postprocedure Evaluation (Signed)
Anesthesia Post Note  Patient: Demetrick C Kushnir  Procedure(s) Performed: EXCISION OF FOREHEAD TUMOR (LIPOMA) (Right: Head)     Patient location during evaluation: PACU Anesthesia Type: MAC Level of consciousness: awake and alert Pain management: pain level controlled Vital Signs Assessment: post-procedure vital signs reviewed and stable Respiratory status: spontaneous breathing, nonlabored ventilation, respiratory function stable and patient connected to nasal cannula oxygen Cardiovascular status: stable and blood pressure returned to baseline Postop Assessment: no apparent nausea or vomiting Anesthetic complications: no   No notable events documented.  Last Vitals:  Vitals:   03/12/23 1130 03/12/23 1145  BP: 114/72 110/78  Pulse: (!) 58 (!) 59  Resp: 14 17  Temp:  36.5 C  SpO2: 95% 93%    Last Pain:  Vitals:   03/12/23 1145  TempSrc:   PainSc: 0-No pain                 Mckinzi Eriksen

## 2023-03-12 NOTE — Progress Notes (Signed)
Pt's HR 50-56. Dr Tacy Dura is aware.

## 2023-03-12 NOTE — Op Note (Signed)
OPERATIVE NOTE  PRENTICE [facility] Date/Time of Admission: 03/12/2023  7:32 AM  CSN: [id number];MRN:[id number] Attending Provider: Scarlette Ar, MD Room/Bed: MCPO/NONE DOB: 1981/03/08 Age: 42 y.o.   Pre-Op Diagnosis: Lipoma, face; Acquired facial deformity  Post-Op Diagnosis: * No post-op diagnosis entered *  Procedure: Procedure(s): EXCISION OF RIGHT FOREHEAD TUMOR , SUBFASCIAL (CPT 21014)  Anesthesia: Monitor Anesthesia Care  Surgeon(s): Mervin Kung, MD  Staff: Circulator: Rogers Seeds, RN Relief Circulator: Bonna Gains, RN Relief Scrub: Gillermo Murdoch Scrub Person: Karrie Doffing  Implants: * No implants in log *  Specimens: ID Type Source Tests Collected by Time Destination  1 : Right Forehead Lipoma Tissue PATH Other SURGICAL PATHOLOGY Scarlette Ar, MD 03/12/2023 1054     Complications: none  EBL: 20 ML  IVF: Per anesthesia ML  Condition: stable  Operative Findings:  3cm right forehead lipoma adjacent to right temporal hairline excised via tricophytic hairline incision  Description of Operation:  The patient was identified in the preoperative area and consent confirmed in the chart.  He was brought to the operating room by the anesthetist and a preoperative huddle was performed confirming patient identity and procedure to be performed.  Once all were in agreement we proceeded with surgery.  MAC anesthesia was induced.  The patient was turned 90 degrees from the anesthetist.  The patient's right forehead was examined with findings as noted above.  A tricophytic incision approximately 3 cm in the right temporal hairline was designed and the area was anesthetized with 1% lidocaine 1 100,000 epinephrine mixed with sodium bicarbonate.  Patient was prepped and draped in standard sterile fashion for a procedure of this kind.  Final preoperative pause was performed we proceeded with surgery.    A 15 blade was used to make the 3 cm tricophytic  hairline incision in zig-zag fashion for aesthetic closure.  Double-pronged skin hooks were applied.  Tenotomy scissors were used to dissect into the subgaleal plane here.  Bleeding was controlled with monopolar cautery.  Double-pronged and Senn retractors were used to facilitate exposure and tenotomy scissors were used to dissect sharply across the lipoma in a subgaleal fashion and then a supraperiosteal fashion the tumor was circumferentially dissected out from the subgaleal space preserving the periosteum of the underlying frontal bone.  The tumor was then removed and sent permanent pathology.  Hemostasis was achieved with monopolar cautery.  The wound was copiously irrigated with sterile saline.  The wound was closed in layered fashion using buried interrupted 4-0 Vicryl sutures for the galeal layer and interrupted 5-0 and 4-0 plain gut sutures for the skin.  A pressure dressing consisting of bacitracin, Telfa and brown paper tape was applied to the forehead.  The patient was then turned back to the anesthetist to awaken him and brought him to the recovery room in stable condition.   Mervin Kung, MD [facility] ENT  03/12/2023

## 2023-03-12 NOTE — Discharge Instructions (Signed)
 Post-operative Patient Instructions Scott Farrell. Hoshal MD  Surgery What to expect: A bandage will be placed on your surgical sites. You can leave the bandages in place until you return to the clinic. You may be scheduled for a series of wound care appointments over the next month.  Recovery/Restrictions: -No strenuous activity for at least the first week after your procedure -Bruising and swelling are expected and will take weeks to go away (consider using ice packs) -Please contact our office immediately if you experience any signs/symptoms of infection (redness, pain, or fever of 100.53F or greater)  Wound Wound care: The goal is to keep your wounds clean and moist to prevent scabs or crusts. You will keep your current post-operative dressing in place undisturbed until after your first post-operative clinic visit. The following instructions apply after your first post-operative visit.  1. Clean wound wounds with soap and water using a cue tip if any crusts are present  2. Next, apply a thin layer of Aquaphor ointment 3. Apply Telfa and cover with brown tape  4. If you had an ear surgery - please apply a thin layer of antibiotic ointment or Aquaphor ointment to your ear incision every day  Care Healing Period: For the best healing, please protect the area from the sun   You may be asked to begin massaging the scars several weeks after surgery. Scars can be massaged in horizontal, vertical, and circular motions.   Adult Post-Operative Pain Management  Pain medication is given immediately following your surgery to help with post-operative pain. Do not wake up or set an alarm to wake up and take pain medications. Sleep and rest.  Upon your discharge home, we suggest scheduled doses of Acetaminophen (Tylenol) every 6 hours and Ibuprofen (Motrin) every 6 hours, alternating between medications every 3 hours (i.e. Take Tylenol and wait 3 hours, then take Motrin and wait 3 hours, repeat) for the  first 3-4 days after surgery. If you are without significant pain, medications can be taken more infrequently. It is important to follow dosing instructions on the medication bottle or prescription.   Sample of medication dosing schedule  Give dose of: Time: Given:  Acetaminophen 12 a.m.   Ibuprofen 3 a.m.   Acetaminophen 6 a.m.   Ibuprofen 9 a.m.   Acetaminophen 12 p.m.   Ibuprofen 3 p.m.   Acetaminophen 6 p.m.   Ibuprofen 9 p.m.    If you need to call after clinic hours for a concern, call [phone] and ask for the "physician on call for ENT."  [address] [place], [place] [address] Phone: [phone]

## 2023-03-12 NOTE — Transfer of Care (Signed)
Immediate Anesthesia Transfer of Care Note  Patient: Scott Farrell  Procedure(s) Performed: EXCISION OF FOREHEAD TUMOR (LIPOMA) (Right: Head)  Patient Location: PACU  Anesthesia Type:MAC  Level of Consciousness: awake, alert , and oriented  Airway & Oxygen Therapy: Patient Spontanous Breathing and Patient connected to face mask oxygen  Post-op Assessment: Report given to RN and Post -op Vital signs reviewed and stable  Post vital signs: Reviewed and stable  Last Vitals:  Vitals Value Taken Time  BP 102/65 03/12/23 1122  Temp 97.5   Pulse 57 03/12/23 1123  Resp 17 03/12/23 1123  SpO2 93 % 03/12/23 1123  Vitals shown include unfiled device data.  Last Pain:  Vitals:   03/12/23 0804  TempSrc:   PainSc: 0-No pain         Complications: No notable events documented.

## 2023-03-13 ENCOUNTER — Encounter (HOSPITAL_COMMUNITY): Payer: Self-pay | Admitting: Otolaryngology

## 2023-03-15 LAB — SURGICAL PATHOLOGY

## 2023-03-25 DIAGNOSIS — F251 Schizoaffective disorder, depressive type: Secondary | ICD-10-CM | POA: Diagnosis not present

## 2023-04-04 NOTE — Progress Notes (Deleted)
[facility]  PATIENT: Scott Farrell DOB: 02-03-1981  REFERRING DOCTOR OR PCP:  *** SOURCE: ***  _________________________________   HISTORICAL  CHIEF COMPLAINT:  No chief complaint on file.   HISTORY OF PRESENT ILLNESS:  I had the pleasure of seeing your patient, Scott Farrell, at [facility] for neurologic consultation regarding his headaches and sleep apnea.  He is a 42 year old man with  ***He is a 42 year old man who reports severe headaches twice a month.   Pain is usually in the temples, occiput and top of head.   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   Moving makes the headache worse.  When one occurs, acetaminophen usually helps.  Pain is pressure like and severe.   Headache lasts 30-60 minutes. Vision is blurry during many episodes.   He does not note any triggers.   He will also have milder headaches once or twice a week that are not associated with other symptoms.   Headaches have generally worsened over the last few years.   The last headache was more severe and he went to the ED.   He has had headaches since he was young (age 89 or so).    Quality of headache is similar.   He used to have more lightheadedness when a headache occurred.     He has been on Depakote 250 mg and Risperdal 2 mg po bid for schizoaffective disorder.   He feels the psychiatric medications have helped.     His mother also has headaches but he is unsure how similar they are.     Imaging: MRI of the brain 09/01/2019 shows 1.  8 mm focus in the left parietal lobe consistent with a cerebral cavernous venous malformation (cavernous angioma).  It is similar in appearance compared to the previous MRI.  There have not been any acute bleeds. 2.  Couple punctate T2/FLAIR hyperintense foci in the subcortical white matter, unchanged compared to the 2012 MRI.  This could represent sequela of chronic microvascular ischemic change or  migraine headaches. 3.  Mild chronic inflammatory changes in some of the ethmoid air cells. 4.  No acute findings.  Normal enhancement pattern.  REVIEW OF SYSTEMS: Constitutional: No fevers, chills, sweats, or change in appetite Eyes: No visual changes, double vision, eye pain Ear, nose and throat: No hearing loss, ear pain, nasal congestion, sore throat Cardiovascular: No chest pain, palpitations Respiratory:  No shortness of breath at rest or with exertion.   No wheezes GastrointestinaI: No nausea, vomiting, diarrhea, abdominal pain, fecal incontinence Genitourinary:  No dysuria, urinary retention or frequency.  No nocturia. Musculoskeletal:  No neck pain, back pain Integumentary: No rash, pruritus, skin lesions Neurological: as above Psychiatric: No depression at this time.  No anxiety Endocrine: No palpitations, diaphoresis, change in appetite, change in weigh or increased thirst Hematologic/Lymphatic:  No anemia, purpura, petechiae. Allergic/Immunologic: No itchy/runny eyes, nasal congestion, recent allergic reactions, rashes  ALLERGIES: Allergies  Allergen Reactions   Metformin And Related Other (See Comments)    Chest tightness    HOME MEDICATIONS:  Current Outpatient Medications:    atorvastatin (LIPITOR) 10 MG tablet, Take 1 tablet (10 mg total) by mouth daily., Disp: 30 tablet, Rfl: 11   divalproex (DEPAKOTE) 500 MG DR tablet, Take 500 mg by mouth at bedtime., Disp: , Rfl:    linaclotide (LINZESS) 145 MCG CAPS capsule, Take 1 capsule (145 mcg total) by mouth daily before breakfast. (Patient not taking: Reported  on 03/04/2023), Disp: 90 capsule, Rfl: 3   risperidone (RISPERDAL) 4 MG tablet, Take 4 mg by mouth 2 (two) times daily., Disp: , Rfl:    valACYclovir (VALTREX) 500 MG tablet, Take 1 tablet (500 mg total) by mouth daily. (Patient taking differently: Take 500 mg by mouth daily as needed (Hsb 1&2).), Disp: 90 tablet, Rfl: 1  PAST MEDICAL HISTORY: Past Medical  History:  Diagnosis Date   Clotting disorder (HCC)    personal hx/o? etiology unclear   Costochondritis    history of   Diverticulitis    Herpes genitalia 06/2010   History of echocardiogram 01/07/2011   normal LV function, EF 60%, trace mitral, tricuspid and pulmonic regurgitation; Dr. Viann Fish   IBS (irritable bowel syndrome)    Mental disorder 2013   hospitalized for psychiatric illness prior   Normal cardiac stress test 01/07/2011   negative treadmill test, no evidence of ischemia, good exercise capacity; Viann Fish, MD;    Pre-diabetes    Prostatitis 11/2010   Dr. Vernie Ammons   Renal stone 06/2012   Urology consult   Schizophrenia [facility])    [facility] Psychiatry, [facility] - Dr. Marga Melnick   Tobacco use disorder     PAST SURGICAL HISTORY: Past Surgical History:  Procedure Laterality Date   EUS  06/30/2012   Procedure: UPPER ENDOSCOPIC ULTRASOUND (EUS) LINEAR;  Surgeon: Rachael Fee, MD;  Location: [facility];  Service: Endoscopy;  Laterality: N/A;  radial linear   EXCISION MASS HEAD Right 03/12/2023   Procedure: EXCISION OF FOREHEAD TUMOR (LIPOMA);  Surgeon: Scarlette Ar, MD;  Location: [facility] OR;  Service: ENT;  Laterality: Right;   WISDOM TOOTH EXTRACTION      FAMILY HISTORY: Family History  Problem Relation Age of Onset   Hypertension Mother    Stroke Mother    Depression Mother    Acne Mother    Thyroid disease Mother    Diabetes Mother    Colon cancer Mother 18   Acute lymphoblastic leukemia Father    Diabetes Father    Cancer Brother    Breast cancer Maternal Aunt    Ovarian cancer Maternal Aunt    Esophageal cancer Neg Hx    Stomach cancer Neg Hx     SOCIAL HISTORY: Social History   Socioeconomic History   Marital status: Single    Spouse name: Not on file   Number of children: 1   Years of education: Not on file   Highest education level: Not on file  Occupational History   Occupation: [employer]  Tobacco Use   Smoking status: Every  Day    Current packs/day: 0.25    Average packs/day: 0.3 packs/day for 23.0 years (5.8 ttl pk-yrs)    Types: Cigars, Cigarettes    Passive exposure: Never   Smokeless tobacco: Never   Tobacco comments:    07/10/2021, smokes 4 [place] cigars daily, he is using a nicotine patch  Vaping Use   Vaping status: Some Days   Substances: Nicotine  Substance and Sexual Activity   Alcohol use: Yes    Comment: occ   Drug use: Not Currently   Sexual activity: Not Currently    Partners: Female  Other Topics Concern   Not on file  Social History Narrative   Lives with mother, Marilynne Drivers, exercising with walking, running, no current relationship; unemployed   Caffeine use: 1 cup per day   Right handed   Social Determinants of Health   Financial Resource Strain: Low Risk  (  08/05/2022)   Overall Financial Resource Strain (CARDIA)    Difficulty of Paying Living Expenses: Not hard at all  Food Insecurity: No Food Insecurity (08/05/2022)   Hunger Vital Sign    Worried About Running Out of Food in the Last Year: Never true    Ran Out of Food in the Last Year: Never true  Transportation Needs: Unmet Transportation Needs (08/05/2022)   PRAPARE - Administrator, Civil Service (Medical): Yes    Lack of Transportation (Non-Medical): Yes  Physical Activity: Inactive (08/05/2022)   Exercise Vital Sign    Days of Exercise per Week: 0 days    Minutes of Exercise per Session: 0 min  Stress: Stress Concern Present (08/05/2022)   [employer] of Occupational Health - Occupational Stress Questionnaire    Feeling of Stress : To some extent  Social Connections: Not on file  Intimate Partner Violence: Not on file       PHYSICAL EXAM  There were no vitals filed for this visit.  There is no height or weight on file to calculate BMI.   General: The patient is well-developed and well-nourished and in no acute distress  HEENT:  Head is [place]/AT.  Sclera are anicteric.  Funduscopic exam shows normal  optic discs and retinal vessels.  Neck: No carotid bruits are noted.  The neck is nontender.  Cardiovascular: The heart has a regular rate and rhythm with a normal S1 and S2. There were no murmurs, gallops or rubs.    Skin: Extremities are without rash or  edema.  Musculoskeletal:  Back is nontender  Neurologic Exam  Mental status: The patient is alert and oriented x 3 at the time of the examination. The patient has apparent normal recent and remote memory, with an apparently normal attention span and concentration ability.   Speech is normal.  Cranial nerves: Extraocular movements are full. Pupils are equal, round, and reactive to light and accomodation.  Visual fields are full.  Facial symmetry is present. There is good facial sensation to soft touch bilaterally.Facial strength is normal.  Trapezius and sternocleidomastoid strength is normal. No dysarthria is noted.  The tongue is midline, and the patient has symmetric elevation of the soft palate. No obvious hearing deficits are noted.  Motor:  Muscle bulk is normal.   Tone is normal. Strength is  5 / 5 in all 4 extremities.   Sensory: Sensory testing is intact to pinprick, soft touch and vibration sensation in all 4 extremities.  Coordination: Cerebellar testing reveals good finger-nose-finger and heel-to-shin bilaterally.  Gait and station: Station is normal.   Gait is normal. Tandem gait is normal. Romberg is negative.   Reflexes: Deep tendon reflexes are symmetric and normal bilaterally.   Plantar responses are flexor.    DIAGNOSTIC DATA (LABS, IMAGING, TESTING) - I reviewed patient records, labs, notes, testing and imaging myself where available.  Lab Results  Component Value Date   WBC 6.7 03/10/2023   HGB 14.0 03/10/2023   HCT 41.7 03/10/2023   MCV 88.9 03/10/2023   PLT 184 03/10/2023      Component Value Date/Time   NA 137 01/07/2022 1535   NA 141 09/03/2020 1640   K 4.2 01/07/2022 1535   CL 104 01/07/2022 1535    CO2 24 01/07/2022 1535   GLUCOSE 94 01/07/2022 1535   BUN 13 01/07/2022 1535   BUN 12 09/03/2020 1640   CREATININE 1.05 01/07/2022 1535   CREATININE 1.09 11/11/2010 1602   CALCIUM 9.5  01/07/2022 1535   PROT 7.6 01/07/2022 1535   PROT 7.5 05/27/2020 1552   ALBUMIN 4.4 01/07/2022 1535   ALBUMIN 4.8 05/27/2020 1552   AST 25 01/07/2022 1535   ALT 22 01/07/2022 1535   ALKPHOS 58 01/07/2022 1535   BILITOT 1.0 01/07/2022 1535   BILITOT 0.4 05/27/2020 1552   GFRNONAA >60 01/07/2022 1535   GFRAA 101 05/27/2020 1552   Lab Results  Component Value Date   CHOL 103 12/09/2022   HDL 27 (L) 12/09/2022   LDLCALC 47 12/09/2022   TRIG 169 (H) 12/09/2022   CHOLHDL 3.8 12/09/2022   Lab Results  Component Value Date   HGBA1C 6.1 (H) 12/09/2022   Lab Results  Component Value Date   VITAMINB12 680 12/09/2022   Lab Results  Component Value Date   TSH 2.250 12/09/2022       ASSESSMENT AND PLAN  ***   Abhinav Mayorquin A. Epimenio Foot, MD, [facility] 04/04/2023, 4:03 PM Certified in Neurology, Clinical Neurophysiology, Sleep Medicine and Neuroimaging  [facility] [address] [place], [place] [address] [phone]

## 2023-04-05 ENCOUNTER — Institutional Professional Consult (permissible substitution): Payer: Medicare PPO | Admitting: Neurology

## 2023-05-02 ENCOUNTER — Encounter (HOSPITAL_COMMUNITY): Payer: Self-pay | Admitting: Emergency Medicine

## 2023-05-02 ENCOUNTER — Other Ambulatory Visit: Payer: Self-pay

## 2023-05-02 ENCOUNTER — Ambulatory Visit (HOSPITAL_COMMUNITY)
Admission: EM | Admit: 2023-05-02 | Discharge: 2023-05-02 | Disposition: A | Discharge status: Home / Self Care | Payer: Medicare PPO | Attending: Family Medicine | Admitting: Family Medicine

## 2023-05-02 DIAGNOSIS — K589 Irritable bowel syndrome without diarrhea: Secondary | ICD-10-CM

## 2023-05-02 DIAGNOSIS — K5900 Constipation, unspecified: Secondary | ICD-10-CM

## 2023-05-02 MED ORDER — POLYETHYLENE GLYCOL 3350 17 GM/SCOOP PO POWD: ORAL | 0 refills | Status: DC

## 2023-05-02 NOTE — ED Provider Notes (Addendum)
[facility]    CSN: [id number] Arrival date & time: 05/02/23  1412      History   Chief Complaint Chief Complaint  Patient presents with   Diarrhea    HPI Scott Farrell is a 42 y.o. male.    Diarrhea  Here for increased frequency of stooling today. Since this morning he has had 3 bowel movements.  They are solid and formed.  He has not had any loose or watery stools today.  Also there has not been any obvious blood in it. Prior to today he has been constipated for the last 3 or 4 days with harder stools and having to strain at defecation.  He does have a history of IBS.  A lot of times when his IBS is causing more trouble with diarrhea he will have loose and watery stools.  No fever or chills and no nausea or vomiting today.  He has had a little bit of right lower abdominal pain.  No dysuria or hematuria.  No urinary frequency.  Past Medical History:  Diagnosis Date   Clotting disorder [facility])    personal hx/o? etiology unclear   Costochondritis    history of   Diverticulitis    Herpes genitalia 06/2010   History of echocardiogram 01/07/2011   normal LV function, EF 60%, trace mitral, tricuspid and pulmonic regurgitation; Dr. Viann Fish   IBS (irritable bowel syndrome)    Mental disorder 2013   hospitalized for psychiatric illness prior   Normal cardiac stress test 01/07/2011   negative treadmill test, no evidence of ischemia, good exercise capacity; Viann Fish, MD;    Pre-diabetes    Prostatitis 11/2010   Dr. Vernie Ammons   Renal stone 06/2012   Urology consult   Schizophrenia [facility])    [facility], [facility] - Dr. Marga Melnick   Tobacco use disorder     Patient Active Problem List   Diagnosis Date Noted   History of herpes genitalis 12/13/2022   Snoring 12/09/2022   Other fatigue 12/09/2022   Subcutaneous nodule of head 12/09/2022   Mixed hyperlipidemia 12/09/2022   Other schizophrenia (HCC) 08/05/2022   Lumbar radiculopathy  11/27/2021   Metatarsalgia of both feet 11/27/2021   Chest pain of uncertain etiology 09/19/2020   Tobacco abuse 09/19/2020   Prediabetes 09/13/2020   High triglycerides 09/13/2020   Other headache syndrome 07/31/2019   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/31/2019   Lightheaded 07/31/2019   Callus of foot 01/27/2018   Constipation 04/27/2012   Family history of malignant neoplasm of gastrointestinal tract 04/27/2012   Gastric mass 04/21/2012    Past Surgical History:  Procedure Laterality Date   EUS  06/30/2012   Procedure: UPPER ENDOSCOPIC ULTRASOUND (EUS) LINEAR;  Surgeon: Rachael Fee, MD;  Location: [facility];  Service: Endoscopy;  Laterality: N/A;  radial linear   EXCISION MASS HEAD Right 03/12/2023   Procedure: EXCISION OF FOREHEAD TUMOR (LIPOMA);  Surgeon: Scarlette Ar, MD;  Location[facility];  Service: ENT;  Laterality: Right;   WISDOM TOOTH EXTRACTION         Home Medications    Prior to Admission medications   Medication Sig Start Date End Date Taking? Authorizing Provider  polyethylene glycol powder (GLYCOLAX/MIRALAX) 17 GM/SCOOP powder 17 g or 1 capful mixed in 6 to 8 ounces of liquid and taken by mouth once daily for constipation. 05/02/23  Yes Zenia Resides, MD  atorvastatin (LIPITOR) 10 MG tablet Take 1 tablet (10 mg total) by mouth daily. 08/05/22 08/05/23  Arnette Felts, FNP  divalproex (DEPAKOTE) 500 MG DR tablet Take 500 mg by mouth at bedtime. 08/06/20   [provider]  linaclotide Karlene Einstein) 145 MCG CAPS capsule Take 1 capsule (145 mcg total) by mouth daily before breakfast. Patient not taking: Reported on 03/04/2023 11/10/22   Doree Albee, PA-C  risperidone (RISPERDAL) 4 MG tablet Take 4 mg by mouth 2 (two) times daily. 03/24/21   [provider]  valACYclovir (VALTREX) 500 MG tablet Take 1 tablet (500 mg total) by mouth daily. Patient taking differently: Take 500 mg by mouth daily as needed (Hsb 1&2). 12/09/22 06/07/23  Arnette Felts, FNP     Family History Family History  Problem Relation Age of Onset   Hypertension Mother    Stroke Mother    Depression Mother    Acne Mother    Thyroid disease Mother    Diabetes Mother    Colon cancer Mother 109   Acute lymphoblastic leukemia Father    Diabetes Father    Cancer Brother    Breast cancer Maternal Aunt    Ovarian cancer Maternal Aunt    Esophageal cancer Neg Hx    Stomach cancer Neg Hx     Social History Social History   Tobacco Use   Smoking status: Every Day    Current packs/day: 0.25    Average packs/day: 0.3 packs/day for 23.0 years (5.8 ttl pk-yrs)    Types: Cigars, Cigarettes    Passive exposure: Never   Smokeless tobacco: Never   Tobacco comments:    07/10/2021, smokes 4 [place] cigars daily, he is using a nicotine patch  Vaping Use   Vaping status: Some Days   Substances: Nicotine  Substance Use Topics   Alcohol use: Yes    Comment: occ   Drug use: Not Currently     Allergies   Metformin and related   Review of Systems Review of Systems  Gastrointestinal:  Positive for diarrhea.     Physical Exam Triage Vital Signs ED Triage Vitals  Encounter Vitals Group     BP 05/02/23 1520 102/68     Systolic BP Percentile --      Diastolic BP Percentile --      Pulse Rate 05/02/23 1520 60     Resp 05/02/23 1520 20     Temp 05/02/23 1520 98 F (36.7 C)     Temp Source 05/02/23 1520 Oral     SpO2 05/02/23 1520 96 %     Weight --      Height --      Head Circumference --      Peak Flow --      Pain Score 05/02/23 1517 4     Pain Loc --      Pain Education --      Exclude from Growth Chart --    No data found.  Updated Vital Signs BP 102/68 (BP Location: Right Arm) Comment (BP Location): large cuff  Pulse 60   Temp 98 F (36.7 C) (Oral)   Resp 20   SpO2 96%   Visual Acuity Right Eye Distance:   Left Eye Distance:   Bilateral Distance:    Right Eye Near:   Left Eye Near:    Bilateral Near:     Physical Exam Vitals  reviewed.  Constitutional:      General: He is not in acute distress.    Appearance: He is not ill-appearing, toxic-appearing or diaphoretic.  HENT:     Mouth/Throat:  Mouth: Mucous membranes are moist.  Eyes:     Extraocular Movements: Extraocular movements intact.     Conjunctiva/sclera: Conjunctivae normal.     Pupils: Pupils are equal, round, and reactive to light.  Cardiovascular:     Rate and Rhythm: Normal rate and regular rhythm.     Heart sounds: No murmur heard. Pulmonary:     Effort: Pulmonary effort is normal.     Breath sounds: Normal breath sounds.  Abdominal:     General: There is no distension.     Palpations: Abdomen is soft. There is no mass.     Tenderness: There is no guarding.     Comments: There is some very mild tenderness in the right lower quadrant.  No rebound and no guarding.  Musculoskeletal:     Cervical back: Neck supple.  Lymphadenopathy:     Cervical: No cervical adenopathy.  Skin:    Coloration: Skin is not pale.  Neurological:     General: No focal deficit present.     Mental Status: He is oriented to person, place, and time.  Psychiatric:        Behavior: Behavior normal.      UC Treatments / Results  Labs (all labs ordered are listed, but only abnormal results are displayed) Labs Reviewed - No data to display  EKG   Radiology No results found.  Procedures Procedures (including critical care time)  Medications Ordered in UC Medications - No data to display  Initial Impression / Assessment and Plan / UC Course  I have reviewed the triage vital signs and the nursing notes.  Pertinent labs & imaging results that were available during my care of the patient were reviewed by me and considered in my medical decision making (see chart for details).     He was wanting to know if he maybe had a viral or bacterial infection.  I think that this is most likely his recovering from some recent constipation, as he does not have any  loose or watery stools.  Also he does not have any signs of a viral or bacterial infection--no fever or nausea or vomiting.  The right lower abdominal pain is mild and his tenderness is mild.  This does not seem to be indicative of anything serious like an appendicitis.  MiraLAX is sent in to help him with the constipation.  I have asked him proceed to the emergency room if he worsens in any way.  He should follow-up with his primary care Final Clinical Impressions(s) / UC Diagnoses   Final diagnoses:  Constipation, unspecified constipation type  Irritable bowel syndrome, unspecified type     Discharge Instructions      MiraLAX/polyethylene glycol--put 17 g or 1 capful in 6 to 8 ounces of water or other liquid to take by mouth once a day for constipation.  Follow-up with your primary care about this issue.  If your abdominal pain worsens in any way, please go to the emergency room for further evaluation.    ED Prescriptions     Medication Sig Dispense Auth. Provider   polyethylene glycol powder (GLYCOLAX/MIRALAX) 17 GM/SCOOP powder 17 g or 1 capful mixed in 6 to 8 ounces of liquid and taken by mouth once daily for constipation. 255 g Zenia Resides, MD      PDMP not reviewed this encounter.   Zenia Resides, MD 05/02/23 1545    Zenia Resides, MD 05/02/23 [phone]

## 2023-05-02 NOTE — ED Triage Notes (Signed)
Reports diarrhea started this morning.  Patient reports a history of IBS.  Wants to know if a virus or what.  Reports 3 episodes of diarrhea today.  Denies vomiting.  Reports abdominal pain.    Has not had any medications for symptoms

## 2023-05-02 NOTE — Discharge Instructions (Signed)
MiraLAX/polyethylene glycol--put 17 g or 1 capful in 6 to 8 ounces of water or other liquid to take by mouth once a day for constipation.  Follow-up with your primary care about this issue.  If your abdominal pain worsens in any way, please go to the emergency room for further evaluation.

## 2023-05-19 ENCOUNTER — Other Ambulatory Visit: Payer: Self-pay | Admitting: Nurse Practitioner

## 2023-05-19 DIAGNOSIS — E782 Mixed hyperlipidemia: Secondary | ICD-10-CM

## 2023-06-14 ENCOUNTER — Ambulatory Visit: Payer: Medicare PPO | Admitting: Nurse Practitioner

## 2023-06-14 ENCOUNTER — Encounter: Payer: Self-pay | Admitting: Nurse Practitioner

## 2023-06-14 VITALS — BP 100/62 | HR 74 | Temp 98.5°F | Ht 77.0 in | Wt 250.0 lb

## 2023-06-14 DIAGNOSIS — Z6829 Body mass index (BMI) 29.0-29.9, adult: Secondary | ICD-10-CM

## 2023-06-14 DIAGNOSIS — R7303 Prediabetes: Secondary | ICD-10-CM

## 2023-06-14 DIAGNOSIS — Z720 Tobacco use: Secondary | ICD-10-CM

## 2023-06-14 DIAGNOSIS — E663 Overweight: Secondary | ICD-10-CM | POA: Diagnosis not present

## 2023-06-14 DIAGNOSIS — M952 Other acquired deformity of head: Secondary | ICD-10-CM | POA: Diagnosis not present

## 2023-06-14 DIAGNOSIS — F2089 Other schizophrenia: Secondary | ICD-10-CM | POA: Diagnosis not present

## 2023-06-14 DIAGNOSIS — Z2821 Immunization not carried out because of patient refusal: Secondary | ICD-10-CM | POA: Diagnosis not present

## 2023-06-14 DIAGNOSIS — E782 Mixed hyperlipidemia: Secondary | ICD-10-CM | POA: Diagnosis not present

## 2023-06-14 DIAGNOSIS — D170 Benign lipomatous neoplasm of skin and subcutaneous tissue of head, face and neck: Secondary | ICD-10-CM | POA: Diagnosis not present

## 2023-06-14 DIAGNOSIS — M791 Myalgia, unspecified site: Secondary | ICD-10-CM | POA: Diagnosis not present

## 2023-06-14 DIAGNOSIS — F209 Schizophrenia, unspecified: Secondary | ICD-10-CM | POA: Diagnosis not present

## 2023-06-14 MED ORDER — METHOCARBAMOL 750 MG PO TABS: 750.0000 mg | ORAL_TABLET | Freq: Three times a day (TID) | ORAL | 0 refills | Status: DC | PRN

## 2023-06-14 MED ORDER — NICOTINE 14 MG/24HR TD PT24: 14.0000 mg | MEDICATED_PATCH | TRANSDERMAL | 0 refills | Status: DC

## 2023-06-14 NOTE — Progress Notes (Signed)
 LILLETTE Kristeen JINNY Gladis, CMA,acting as a neurosurgeon for [name], FNP.,have documented all relevant documentation on the behalf of [name], FNP,as directed by  [name], FNP while in the presence of [name], FNP.  Subjective:  Patient ID: Scott Farrell , male    DOB: 1980-09-01 , 43 y.o.   MRN: [id number]  Chief Complaint  Patient presents with   Hyperlipidemia    HPI  Patient presents today for a chol and pre-dm follow up, Patient reports compliance with medication. Patient denies any chest pain, SOB, or headaches. Patient reports a little neck pain. He has not been using any heat pads. He is taking aspirin or tylenol  he is not sure. He uses a memory foam pillow.  He does admit to eating an increased amount of cakes.     Past Medical History:  Diagnosis Date   Clotting disorder (HCC)    personal hx/o? etiology unclear   Costochondritis    history of   Diverticulitis    Herpes genitalia 06/2010   History of echocardiogram 01/07/2011   normal LV function, EF 60%, trace mitral, tricuspid and pulmonic regurgitation; Dr. Jacques Somerset   IBS (irritable bowel syndrome)    Mental disorder 2013   hospitalized for psychiatric illness prior   Normal cardiac stress test 01/07/2011   negative treadmill test, no evidence of ischemia, good exercise capacity; Jacques Somerset, MD;    Pre-diabetes    Prostatitis 11/2010   Dr. Ottelin   Renal stone 06/2012   Urology consult   Schizophrenia [facility])    [facility], [facility] - Dr. Zell Harbour   Tobacco use disorder      Family History  Problem Relation Age of Onset   Hypertension Mother    Stroke Mother    Depression Mother    Acne Mother    Thyroid  disease Mother    Diabetes Mother    Colon cancer Mother 78   Acute lymphoblastic leukemia Father    Diabetes Father    Cancer Brother    Breast cancer Maternal Aunt    Ovarian cancer Maternal Aunt    Esophageal cancer Neg Hx    Stomach cancer Neg Hx      Current  Outpatient Medications:    divalproex  (DEPAKOTE ) 500 MG DR tablet, Take 500 mg by mouth at bedtime., Disp: , Rfl:    methocarbamol  (ROBAXIN -750) 750 MG tablet, Take 1 tablet (750 mg total) by mouth every 8 (eight) hours as needed for muscle spasms., Disp: 30 tablet, Rfl: 0   nicotine  (NICODERM CQ  - DOSED IN MG/24 HOURS) 14 mg/24hr patch, Place 1 patch (14 mg total) onto the skin daily., Disp: 90 patch, Rfl: 0   polyethylene glycol powder (GLYCOLAX /MIRALAX ) 17 GM/SCOOP powder, 17 g or 1 capful mixed in 6 to 8 ounces of liquid and taken by mouth once daily for constipation., Disp: 255 g, Rfl: 0   risperidone (RISPERDAL) 4 MG tablet, Take 4 mg by mouth 2 (two) times daily., Disp: , Rfl:    atorvastatin  (LIPITOR) 20 MG tablet, Take 1 tablet (20 mg total) by mouth daily., Disp: 90 tablet, Rfl: 1   linaclotide  (LINZESS ) 145 MCG CAPS capsule, Take 1 capsule (145 mcg total) by mouth daily before breakfast. (Patient not taking: Reported on 03/04/2023), Disp: 90 capsule, Rfl: 3   Allergies  Allergen Reactions   Metformin  And Related Other (See Comments)    Chest tightness     Review of Systems  Constitutional: Negative.   Respiratory: Negative.  Cardiovascular: Negative.   Musculoskeletal:        Neck pain to the upper shoulder area  Neurological: Negative.   Psychiatric/Behavioral: Negative.       Today's Vitals   06/14/23 1409  BP: 100/62  Pulse: 74  Temp: 98.5 F (36.9 C)  TempSrc: Oral  Weight: 250 lb (113.4 kg)  Height: 6' 5 (1.956 m)  PainSc: 0-No pain   Body mass index is 29.65 kg/m.  Wt Readings from Last 3 Encounters:  06/14/23 250 lb (113.4 kg)  03/12/23 250 lb (113.4 kg)  03/10/23 250 lb 11.2 oz (113.7 kg)     Objective:  Physical Exam Vitals reviewed.  Constitutional:      General: He is not in acute distress.    Appearance: Normal appearance. He is obese.  HENT:     Head: Normocephalic.  Cardiovascular:     Rate and Rhythm: Normal rate and regular rhythm.      Pulses: Normal pulses.     Heart sounds: Normal heart sounds. No murmur heard. Pulmonary:     Effort: Pulmonary effort is normal. No respiratory distress.     Breath sounds: Normal breath sounds. No wheezing.  Musculoskeletal:        General: Tenderness (right neck tenderness near shoulder on palpation) present. Normal range of motion.  Skin:    General: Skin is warm and dry.     Capillary Refill: Capillary refill takes less than 2 seconds.  Neurological:     General: No focal deficit present.     Mental Status: He is alert and oriented to person, place, and time.     Cranial Nerves: No cranial nerve deficit.     Motor: No weakness.  Psychiatric:        Mood and Affect: Mood normal.        Behavior: Behavior normal.        Thought Content: Thought content normal.        Judgment: Judgment normal.      Assessment And Plan:  Mixed hyperlipidemia Assessment & Plan: Cholesterol levels have improved.  Continue focusing on low-fat diet and taking statin.  Tolerating well.  Orders: -     Lipid panel  Prediabetes Assessment & Plan: Hemoglobin A1c is stable. Will check A1c   Orders: -     Hemoglobin A1c  Other schizophrenia (HCC) Assessment & Plan: Continue follow-up with behavioral health.  No recent changes per patient.   Influenza vaccination declined Assessment & Plan: Patient declined influenza vaccination at this time. Patient is aware that influenza vaccine prevents illness in 70% of healthy people, and reduces hospitalizations to 30-70% in elderly. This vaccine is recommended annually. Education has been provided regarding the importance of this vaccine but patient still declined. Advised may receive this vaccine at local pharmacy or Health Dept.or vaccine clinic. Aware to provide a copy of the vaccination record if obtained from local pharmacy or Health Dept.  Pt is willing to accept risk associated with refusing vaccination.    COVID-19 vaccination  declined Assessment & Plan: Declines covid 19 vaccine. Discussed risk of covid 79 and if he changes her mind about the vaccine to call the office. Education has been provided regarding the importance of this vaccine but patient still declined. Advised may receive this vaccine at local pharmacy or Health Dept.or vaccine clinic. Aware to provide a copy of the vaccination record if obtained from local pharmacy or Health Dept.  Encouraged to take multivitamin, vitamin d , vitamin c and  zinc  to increase immune system. Aware can call office if would like to have vaccine here at office. Verbalized acceptance and understanding.    Overweight with body mass index (BMI) of 29 to 29.9 in adult  Tobacco abuse Assessment & Plan: Smoking cessation instruction/counseling given:  counseled patient on the dangers of tobacco use, advised patient to stop smoking, and reviewed strategies to maximize success   Orders: -     Nicotine ; Place 1 patch (14 mg total) onto the skin daily.  Dispense: 90 patch; Refill: 0  Muscle tension pain Assessment & Plan: Tension to right shoulder area near neck, advised to use heat to area and neck exercises.   Orders: -     Methocarbamol ; Take 1 tablet (750 mg total) by mouth every 8 (eight) hours as needed for muscle spasms.  Dispense: 30 tablet; Refill: 0    Return for keep AWV in March and cancel with me, schedule HM in 6 months.  Patient was given opportunity to ask questions. Patient verbalized understanding of the plan and was able to repeat key elements of the plan. All questions were answered to their satisfaction.     LILLETTE [name], FNP, have reviewed all documentation for this visit. The documentation on 06/14/23 for the exam, diagnosis, procedures, and orders are all accurate and complete.  IF YOU HAVE BEEN REFERRED TO A SPECIALIST, IT MAY TAKE 1-2 WEEKS TO SCHEDULE/PROCESS THE REFERRAL. IF YOU HAVE NOT HEARD FROM US /SPECIALIST IN TWO WEEKS, PLEASE GIVE US  A CALL  AT [phone].

## 2023-06-15 LAB — LIPID PANEL
Chol/HDL Ratio: 4.7 {ratio} (ref 0.0–5.0)
Cholesterol, Total: 127 mg/dL (ref 100–199)
HDL: 27 mg/dL — ABNORMAL LOW (ref >39)
LDL Chol Calc (NIH): 55 mg/dL (ref 0–99)
Triglycerides: 289 mg/dL — ABNORMAL HIGH (ref 0–149)
VLDL Cholesterol Cal: 45 mg/dL — ABNORMAL HIGH (ref 5–40)

## 2023-06-15 LAB — HEMOGLOBIN A1C
Est. average glucose Bld gHb Est-mCnc: 131 mg/dL
Hgb A1c MFr Bld: 6.2 % — ABNORMAL HIGH (ref 4.8–5.6)

## 2023-06-16 ENCOUNTER — Other Ambulatory Visit: Payer: Self-pay | Admitting: Nurse Practitioner

## 2023-06-16 DIAGNOSIS — E782 Mixed hyperlipidemia: Secondary | ICD-10-CM

## 2023-06-16 MED ORDER — ATORVASTATIN CALCIUM 20 MG PO TABS: 20.0000 mg | ORAL_TABLET | Freq: Every day | ORAL | 1 refills | Status: DC

## 2023-06-23 ENCOUNTER — Encounter: Payer: Self-pay | Admitting: Nurse Practitioner

## 2023-06-23 DIAGNOSIS — M791 Myalgia, unspecified site: Secondary | ICD-10-CM | POA: Insufficient documentation

## 2023-06-23 DIAGNOSIS — Z2821 Immunization not carried out because of patient refusal: Secondary | ICD-10-CM | POA: Insufficient documentation

## 2023-06-23 NOTE — Assessment & Plan Note (Signed)
Hemoglobin A1c is stable. Will check A1c

## 2023-06-23 NOTE — Assessment & Plan Note (Signed)
Tension to right shoulder area near neck, advised to use heat to area and neck exercises.

## 2023-06-23 NOTE — Assessment & Plan Note (Signed)
Smoking cessation instruction/counseling given:  counseled patient on the dangers of tobacco use, advised patient to stop smoking, and reviewed strategies to maximize success 

## 2023-06-23 NOTE — Assessment & Plan Note (Signed)

## 2023-06-23 NOTE — Assessment & Plan Note (Signed)
Continue follow-up with behavioral health.  No recent changes per patient.

## 2023-06-23 NOTE — Assessment & Plan Note (Signed)
Cholesterol levels have improved.  Continue focusing on low-fat diet and taking statin.  Tolerating well.

## 2023-06-23 NOTE — Assessment & Plan Note (Signed)

## 2023-07-07 DIAGNOSIS — F251 Schizoaffective disorder, depressive type: Secondary | ICD-10-CM | POA: Diagnosis not present

## 2023-08-11 ENCOUNTER — Ambulatory Visit (INDEPENDENT_AMBULATORY_CARE_PROVIDER_SITE_OTHER): Payer: Self-pay

## 2023-08-11 ENCOUNTER — Ambulatory Visit: Payer: Self-pay | Admitting: Nurse Practitioner

## 2023-08-11 VITALS — BP 112/62 | HR 78 | Temp 98.9°F | Ht 77.5 in | Wt 248.8 lb

## 2023-08-11 DIAGNOSIS — Z0000 Encounter for general adult medical examination without abnormal findings: Secondary | ICD-10-CM | POA: Diagnosis not present

## 2023-08-11 DIAGNOSIS — F2089 Other schizophrenia: Secondary | ICD-10-CM

## 2023-08-11 NOTE — Progress Notes (Signed)
 Subjective:   Scott Farrell is a 43 y.o. who presents for a Medicare Wellness preventive visit.  Visit Complete: In person    AWV Questionnaire: No: Patient Medicare AWV questionnaire was not completed prior to this visit.  Cardiac Risk Factors include: advanced age (>38men, >86 women);male gender     Objective:    Today's Vitals   08/11/23 1357  BP: 112/62  Pulse: 78  Temp: 98.9 F (37.2 C)  TempSrc: Oral  SpO2: 94%  Weight: 248 lb 12.8 oz (112.9 kg)  Height: 6' 5.5" (1.969 m)   Body mass index is 29.12 kg/m.     08/11/2023    2:05 PM 03/10/2023   11:03 AM 08/05/2022    3:30 PM 01/07/2022    3:38 PM 11/25/2021    8:20 PM 09/09/2021    9:17 PM 07/10/2021    3:31 PM  Advanced Directives  Does Patient Have a Medical Advance Directive? No No No No No No No  Would patient like information on creating a medical advance directive?  No - Patient declined No - Patient declined        Current Medications (verified) Outpatient Encounter Medications as of 08/11/2023  Medication Sig   atorvastatin (LIPITOR) 20 MG tablet Take 1 tablet (20 mg total) by mouth daily.   divalproex (DEPAKOTE) 500 MG DR tablet Take 500 mg by mouth at bedtime.   IBUPROFEN PO Take 1 tablet by mouth as needed.   methocarbamol (ROBAXIN-750) 750 MG tablet Take 1 tablet (750 mg total) by mouth every 8 (eight) hours as needed for muscle spasms.   polyethylene glycol powder (GLYCOLAX/MIRALAX) 17 GM/SCOOP powder 17 g or 1 capful mixed in 6 to 8 ounces of liquid and taken by mouth once daily for constipation.   risperidone (RISPERDAL) 4 MG tablet Take 4 mg by mouth 2 (two) times daily.   linaclotide (LINZESS) 145 MCG CAPS capsule Take 1 capsule (145 mcg total) by mouth daily before breakfast. (Patient not taking: Reported on 03/04/2023)   nicotine (NICODERM CQ - DOSED IN MG/24 HOURS) 14 mg/24hr patch Place 1 patch (14 mg total) onto the skin daily. (Patient not taking: Reported on 08/11/2023)   No  facility-administered encounter medications on file as of 08/11/2023.    Allergies (verified) Metformin and related   History: Past Medical History:  Diagnosis Date   Clotting disorder (HCC)    personal hx/o? etiology unclear   Costochondritis    history of   Diverticulitis    Herpes genitalia 06/2010   History of echocardiogram 01/07/2011   normal LV function, EF 60%, trace mitral, tricuspid and pulmonic regurgitation; Dr. Viann Fish   IBS (irritable bowel syndrome)    Mental disorder 2013   hospitalized for psychiatric illness prior   Normal cardiac stress test 01/07/2011   negative treadmill test, no evidence of ischemia, good exercise capacity; Viann Fish, MD;    Pre-diabetes    Prostatitis 11/2010   Dr. Vernie Ammons   Renal stone 06/2012   Urology consult   Schizophrenia [facility])    [facility], [facility] - Dr. Marga Melnick   Tobacco use disorder    Past Surgical History:  Procedure Laterality Date   EUS  06/30/2012   Procedure: UPPER ENDOSCOPIC ULTRASOUND (EUS) LINEAR;  Surgeon: Rachael Fee, MD;  Location: [facility];  Service: Endoscopy;  Laterality: N/A;  radial linear   EXCISION MASS HEAD Right 03/12/2023   Procedure: EXCISION OF FOREHEAD TUMOR (LIPOMA);  Surgeon: Scarlette Ar, MD;  Location:  [facility] OR;  Service: ENT;  Laterality: Right;   WISDOM TOOTH EXTRACTION     Family History  Problem Relation Age of Onset   Hypertension Mother    Stroke Mother    Depression Mother    Acne Mother    Thyroid disease Mother    Diabetes Mother    Colon cancer Mother 21   Acute lymphoblastic leukemia Father    Diabetes Father    Cancer Brother    Breast cancer Maternal Aunt    Ovarian cancer Maternal Aunt    Esophageal cancer Neg Hx    Stomach cancer Neg Hx    Social History   Socioeconomic History   Marital status: Single    Spouse name: Not on file   Number of children: 1   Years of education: Not on file   Highest education level: Not on file   Occupational History   Occupation: [employer]  Tobacco Use   Smoking status: Every Day    Current packs/day: 0.25    Average packs/day: 0.3 packs/day for 23.0 years (5.8 ttl pk-yrs)    Types: Cigars, Cigarettes    Passive exposure: Never   Smokeless tobacco: Never   Tobacco comments:    07/10/2021, smokes 4 Cheyenne cigars daily, he is using a nicotine patch  Vaping Use   Vaping status: Former   Substances: Nicotine  Substance and Sexual Activity   Alcohol use: Yes    Comment: occ   Drug use: Not Currently   Sexual activity: Not Currently    Partners: Female  Other Topics Concern   Not on file  Social History Narrative   Lives with mother, [place], exercising with walking, running, no current relationship; unemployed   Caffeine use: 1 cup per day   Right handed   Social Drivers of Health   Financial Resource Strain: Low Risk  (08/11/2023)   Overall Financial Resource Strain (CARDIA)    Difficulty of Paying Living Expenses: Not hard at all  Food Insecurity: No Food Insecurity (08/11/2023)   Hunger Vital Sign    Worried About Running Out of Food in the Last Year: Never true    Ran Out of Food in the Last Year: Never true  Transportation Needs: No Transportation Needs (08/11/2023)   PRAPARE - Administrator, Civil Service (Medical): No    Lack of Transportation (Non-Medical): No  Physical Activity: Inactive (08/11/2023)   Exercise Vital Sign    Days of Exercise per Week: 0 days    Minutes of Exercise per Session: 0 min  Stress: No Stress Concern Present (08/11/2023)   [employer] of Occupational Health - Occupational Stress Questionnaire    Feeling of Stress : Not at all  Social Connections: Socially Isolated (08/11/2023)   Social Connection and Isolation Panel [NHANES]    Frequency of Communication with Friends and Family: More than three times a week    Frequency of Social Gatherings with Friends and Family: Never    Attends Religious Services: Never     Database administrator or Organizations: No    Attends Banker Meetings: Never    Marital Status: Never married    Tobacco Counseling Ready to quit: Yes Counseling given: Not Answered Tobacco comments: 07/10/2021, smokes 4 Cheyenne cigars daily, he is using a nicotine patch    Clinical Intake:  Pre-visit preparation completed: Yes  Pain : No/denies pain     Nutritional Risks: Nausea/ vomitting/ diarrhea Diabetes: No  How often do you  need to have someone help you when you read instructions, pamphlets, or other written materials from your doctor or pharmacy?: 1 - Never  Interpreter Needed?: No  Information entered by :: NAllen LPN   Activities of Daily Living     08/11/2023    1:58 PM 03/10/2023   11:08 AM  In your present state of health, do you have any difficulty performing the following activities:  Hearing? 0   Vision? 0   Difficulty concentrating or making decisions? 1   Walking or climbing stairs? 0   Dressing or bathing? 0   Doing errands, shopping? 0 0  Preparing Food and eating ? N   Using the Toilet? N   In the past six months, have you accidently leaked urine? N   Do you have problems with loss of bowel control? N   Managing your Medications? N   Managing your Finances? N   Housekeeping or managing your Housekeeping? N     Patient Care Team: Arnette Felts, FNP as PCP - General (General Practice) Harlan Stains, [facility] (Inactive) (Pharmacist)  Indicate any recent Medical Services you may have received from other than [facility] providers in the past year (date may be approximate).     Assessment:   This is a routine wellness examination for Scott Farrell.  Hearing/Vision screen Hearing Screening - Comments:: Denies hearing issues Vision Screening - Comments:: No regular eye exams   Goals Addressed             This Visit's Progress    Patient Stated       08/11/2023, wants to lose weight       Depression Screen     08/11/2023     2:07 PM 08/05/2022    3:31 PM 08/05/2022    2:56 PM 12/15/2021    2:53 PM 07/10/2021    3:32 PM 09/03/2020    3:03 PM 07/03/2020    3:52 PM  PHQ 2/9 Scores  PHQ - 2 Score 0 0 0 2 0 0 2  PHQ- 9 Score 6   7   6     Fall Risk     08/11/2023    2:06 PM 08/05/2022    3:30 PM 08/05/2022    2:56 PM 12/15/2021    2:53 PM 07/10/2021    3:32 PM  Fall Risk   Falls in the past year? 0 0 0 0 0  Number falls in past yr: 0 0  0   Injury with Fall? 0 0  0   Risk for fall due to : Medication side effect Medication side effect   Medication side effect  Follow up Falls prevention discussed;Falls evaluation completed Falls prevention discussed;Education provided;Falls evaluation completed  Falls evaluation completed Falls evaluation completed;Education provided;Falls prevention discussed    MEDICARE RISK AT HOME:  Medicare Risk at Home Any stairs in or around the home?: Yes If so, are there any without handrails?: No Home free of loose throw rugs in walkways, pet beds, electrical cords, etc?: Yes Adequate lighting in your home to reduce risk of falls?: Yes Life alert?: No Use of a cane, walker or w/c?: No Grab bars in the bathroom?: No Shower chair or bench in shower?: No Elevated toilet seat or a handicapped toilet?: Yes  TIMED UP AND GO:  Was the test performed?  Yes  Length of time to ambulate 10 feet: 5 sec Gait steady and fast without use of assistive device  Cognitive Function: 6CIT completed  08/11/2023    2:10 PM 08/05/2022    3:32 PM 07/10/2021    3:34 PM 07/03/2020    3:56 PM  6CIT Screen  What Year? 0 points 0 points 0 points 0 points  What month? 0 points 0 points 0 points 0 points  What time? 0 points 0 points 0 points 0 points  Count back from 20 0 points 0 points 0 points 0 points  Months in reverse 0 points 0 points 4 points 0 points  Repeat phrase 0 points 0 points 0 points 0 points  Total Score 0 points 0 points 4 points 0 points    Immunizations Immunization History   Administered Date(s) Administered   Influenza Inj Mdck Quad Pf 06/02/2018   Influenza-Unspecified 03/02/2020, 05/28/2021, 04/07/2022   Moderna Sars-Covid-2 Vaccination 09/18/2019, 10/19/2019, 05/18/2020, 05/28/2021, 04/07/2022   Pfizer Covid-19 Vaccine Bivalent Booster 59yrs & up 05/28/2021   Pfizer(Comirnaty)Fall Seasonal Vaccine 12 years and older 04/07/2022   Tdap 12/15/2021    Screening Tests Health Maintenance  Topic Date Due   COVID-19 Vaccine (6 - 2024-25 season) 01/31/2023   INFLUENZA VACCINE  08/30/2023 (Originally 12/31/2022)   Pneumococcal Vaccine 31-41 Years old (1 of 2 - PCV) 06/13/2024 (Originally 06/19/1986)   Medicare Annual Wellness (AWV)  08/10/2024   Colonoscopy  10/31/2024   DTaP/Tdap/Td (2 - Td or Tdap) 12/16/2031   Hepatitis C Screening  Completed   HIV Screening  Completed   HPV VACCINES  Aged Out    Health Maintenance  Health Maintenance Due  Topic Date Due   COVID-19 Vaccine (6 - 2024-25 season) 01/31/2023   Health Maintenance Items Addressed: Due for covid vaccine  Additional Screening:  Vision Screening: Recommended annual ophthalmology exams for early detection of glaucoma and other disorders of the eye.  Dental Screening: Recommended annual dental exams for proper oral hygiene  Community Resource Referral / Chronic Care Management: CRR required this visit?  No   CCM required this visit?  No     Plan:     I have personally reviewed and noted the following in the patient's chart:   Medical and social history Use of alcohol, tobacco or illicit drugs  Current medications and supplements including opioid prescriptions. Patient is not currently taking opioid prescriptions. Functional ability and status Nutritional status Physical activity Advanced directives List of other physicians Hospitalizations, surgeries, and ER visits in previous 12 months Vitals Screenings to include cognitive, depression, and falls Referrals and  appointments  In addition, I have reviewed and discussed with patient certain preventive protocols, quality metrics, and best practice recommendations. A written personalized care plan for preventive services as well as general preventive health recommendations were provided to patient.     Barb Merino, LPN   1/61/0960   After Visit Summary: (In Person-Printed) AVS printed and given to the patient  Notes: Nothing significant to report at this time.

## 2023-08-11 NOTE — Patient Instructions (Addendum)
 [name] , Thank you for taking time to come for your Medicare Wellness Visit. I appreciate your ongoing commitment to your health goals. Please review the following plan we discussed and let me know if I can assist you in the future.   Referrals/Orders/Follow-Ups/Clinician Recommendations: none  This is a list of the screening recommended for you and due dates:  Health Maintenance  Topic Date Due   COVID-19 Vaccine (6 - 2024-25 season) 01/31/2023   Flu Shot  08/30/2023*   Pneumococcal Vaccination (1 of 2 - PCV) 06/13/2024*   Medicare Annual Wellness Visit  08/10/2024   Colon Cancer Screening  10/31/2024   DTaP/Tdap/Td vaccine (2 - Td or Tdap) 12/16/2031   Hepatitis C Screening  Completed   HIV Screening  Completed   HPV Vaccine  Aged Out  *Topic was postponed. The date shown is not the original due date.    Advanced directives: (Declined) Advance directive discussed with you today. Even though you declined this today, please call our office should you change your mind, and we can give you the proper paperwork for you to fill out.0  Next Medicare Annual Wellness Visit scheduled for next year: No, office will schedule  insert Preventive Care attachment Insert FALL PREVENTION attachment if needed

## 2023-08-12 ENCOUNTER — Telehealth: Payer: Self-pay | Admitting: *Deleted

## 2023-08-12 NOTE — Progress Notes (Signed)
 Complex Care Management Note  Care Guide Note 08/12/2023 Name: Scott Farrell MRN: [id number] DOB: 1980/06/17  Scott Farrell is a 43 y.o. year old male who sees Arnette Felts, FNP for primary care. I reached out to [employer] by phone today to offer complex care management services.  Scott Farrell was given information about Complex Care Management services today including:   The Complex Care Management services include support from the care team which includes your Nurse Care Manager, Clinical Social Worker, or Pharmacist.  The Complex Care Management team is here to help remove barriers to the health concerns and goals most important to you. Complex Care Management services are voluntary, and the patient may decline or stop services at any time by request to their care team member.   Complex Care Management Consent Status: Patient agreed to services and verbal consent obtained.   Follow up plan:  Telephone appointment with complex care management team member scheduled for:  3/24  Encounter Outcome:  Patient Scheduled  Gwenevere Ghazi  [facility]  [facility] Guide  Direct Dial: [phone]  Fax [phone]

## 2023-08-23 ENCOUNTER — Encounter: Payer: Self-pay | Admitting: Licensed Clinical Social Worker

## 2023-08-24 ENCOUNTER — Telehealth: Payer: Self-pay | Admitting: Licensed Clinical Social Worker

## 2023-08-24 NOTE — Patient Outreach (Signed)
 Care Coordination   08/23/2023 Name: Scott Farrell MRN: [id number] DOB: Jan 19, 1981   Care Coordination Outreach Attempts:  An unsuccessful outreach was attempted for an appointment today.  Follow Up Plan:  Additional outreach attempts will be made to offer the patient complex care management information and services.   Encounter Outcome:  No Answer   Care Coordination Interventions:  No, not indicated    Jenel Lucks, LCSW [place]  [facility], [facility] Clinical Social Worker Direct Dial: [phone]  Fax: [phone] Website: [email or web address] 5:45 PM

## 2023-08-30 ENCOUNTER — Telehealth: Payer: Self-pay | Admitting: *Deleted

## 2023-08-30 NOTE — Progress Notes (Signed)
 Complex Care Management Care Guide Note  08/30/2023 Name: Scott Farrell MRN: [id number] DOB: 12-25-1980  Scott Farrell is a 43 y.o. year old male who is a primary care patient of Arnette Felts, FNP and is actively engaged with the care management team. I reached out to Scott Farrell by phone today to assist with re-scheduling  with the Licensed Clinical Child psychotherapist.  Follow up plan: Unsuccessful telephone outreach attempt made. A HIPAA compliant phone message was left for the patient providing contact information and requesting a return call.  Gwenevere Ghazi  [facility]  [facility], [facility] Guide  Direct Dial: [phone]  Fax [phone]

## 2023-08-30 NOTE — Progress Notes (Signed)
 Complex Care Management Care Guide Note  08/30/2023 Name: JAIMIN KRUPKA MRN: [id number] DOB: [birth year]  Azzie Glatter is a 42 y.o. year old male who is a primary care patient of Arnette Felts, FNP and is actively engaged with the care management team.  Azzie Glatter called by phone today to assist with re-scheduling  with the Licensed Clinical Social Worker.  Follow up plan: Telephone appointment with complex care management team member scheduled for:  09/07/23  Gwenevere Ghazi  [facility]  Value[facility]Based Care Institute, [facility] Guide  Direct Dial: [phone]  Fax [phone]

## 2023-08-31 DIAGNOSIS — F251 Schizoaffective disorder, depressive type: Secondary | ICD-10-CM | POA: Diagnosis not present

## 2023-09-07 ENCOUNTER — Ambulatory Visit: Payer: Self-pay | Admitting: Licensed Clinical Social Worker

## 2023-09-07 NOTE — Patient Instructions (Signed)
 Visit Information  Thank you for taking time to visit with me today. Please don't hesitate to contact me if I can be of assistance to you before our next scheduled appointment.  Our next appointment is by telephone on 4/22 at 12:30 PM Please call the care guide team at [phone] if you need to cancel or reschedule your appointment.   Following is a copy of your care plan:   Goals Addressed             This Visit's Progress    LCSW VBCI Social Work Care Plan   On track    Problems:   Disease Management support and education needs related to Depression: anxiety, depressed mood, and loss of energy/fatigue and Stress at financial strain  CSW Clinical Goal(s):   Over the next 90 days the Patient will attend all scheduled medical appointments as evidenced by patient report and care team review of appointment completion in EMR:   demonstrate a reduction in symptoms related to Depression: anxiety  will follow up with [facility] as directed by Social Work.  Interventions:  Mental Health:  Evaluation of current treatment plan related to Depression: anxiety Active listening / Reflection utilized Behavioral Activation reviewed Discussed referral options to connect for ongoing therapy: Patient participates in med management at [employer]. Pt agreed to f/up with them to schedule counseling PHQ2/PHQ9 completed Provided general psycho-education for mental health needs  Patient Goals/Self-Care Activities:  Call [facility] to schedule your counseling appointment.  Plan:   Telephone follow up appointment with care management team member scheduled for:  2 weeks        Please call the Suicide and Crisis Lifeline: 988 go to [facility] Urgent [facility] [address], [place] [phone]) call 911 if you are experiencing a Mental Health or Behavioral Health Crisis or need someone to talk to.  Patient verbalizes understanding of instructions and care plan  provided today and agrees to view in MyChart. Active MyChart status and patient understanding of how to access instructions and care plan via MyChart confirmed with patient.     Windy Fast [facility]  [facility], [facility] Clinical Social Worker Direct Dial: [phone]  Fax: [phone] Website: [email or web address] 2:15 PM

## 2023-09-07 NOTE — Patient Outreach (Signed)
 Complex Care Management   Visit Note  09/07/2023  Name:  Scott Farrell MRN: [id number] DOB: 03/25/1981  Situation: Referral received for Complex Care Management related to [place]/Behavioral Health diagnosis Depression/Anxiety  I obtained verbal consent from pt.  Visit completed with pt  on the phone  Background:   Past Medical History:  Diagnosis Date   Clotting disorder [facility])    personal hx/o? etiology unclear   Costochondritis    history of   Diverticulitis    Herpes genitalia 06/2010   History of echocardiogram 01/07/2011   normal LV function, EF 60%, trace mitral, tricuspid and pulmonic regurgitation; Dr. Viann Fish   IBS (irritable bowel syndrome)    Mental disorder 2013   hospitalized for psychiatric illness prior   Normal cardiac stress test 01/07/2011   negative treadmill test, no evidence of ischemia, good exercise capacity; Viann Fish, MD;    Pre-diabetes    Prostatitis 11/2010   Dr. Vernie Ammons   Renal stone 06/2012   Urology consult   Schizophrenia [facility])    [facility], [facility] - Dr. Marga Melnick   Tobacco use disorder     Assessment: Patient Reported Symptoms:  Cognitive    Neurological      HEENT      Cardiovascular      Respiratory      Endocrine      Gastrointestinal      Genitourinary      Integumentary      Musculoskeletal      Psychosocial       There were no vitals filed for this visit.  Medications Reviewed Today     Reviewed by Bridgett Larsson, LCSW (Social Worker) on 09/07/23 at 1409  Med List Status: <None>   Medication Order Taking? Sig Documenting Provider Last Dose Status Informant  atorvastatin (LIPITOR) 20 MG tablet [id number] Yes Take 1 tablet (20 mg total) by mouth daily. Arnette Felts, FNP Taking Active   divalproex (DEPAKOTE) 500 MG DR tablet [id number] Yes Take 500 mg by mouth at bedtime. [provider] Taking Active Self  IBUPROFEN PO [id number] No Take 1 tablet by mouth as needed.  Patient  not taking: Reported on 09/07/2023   [provider] Not Taking Active Self  linaclotide [facility]) 145 MCG CAPS capsule [id number] No Take 1 capsule (145 mcg total) by mouth daily before breakfast.  Patient not taking: Reported on 03/04/2023   Doree Albee, PA-C Not Taking Active Self  methocarbamol (ROBAXIN-750) 750 MG tablet [id number] Yes Take 1 tablet (750 mg total) by mouth every 8 (eight) hours as needed for muscle spasms. Arnette Felts, FNP Taking Active   nicotine (NICODERM CQ - DOSED IN MG/24 HOURS) 14 mg/24hr patch [id number] No Place 1 patch (14 mg total) onto the skin daily.  Patient not taking: Reported on 09/07/2023   Arnette Felts, FNP Not Taking Active            Med Note Melvyn Neth, Argil Mahl D   Tue Sep 07, 2023 12:58 PM) Pt states he needs refill   polyethylene glycol powder (GLYCOLAX/MIRALAX) 17 GM/SCOOP powder [id number] Yes 17 g or 1 capful mixed in 6 to 8 ounces of liquid and taken by mouth once daily for constipation. Zenia Resides, MD Taking Active   risperidone (RISPERDAL) 4 MG tablet [id number] Yes Take 4 mg by mouth 2 (two) times daily. [provider] Taking Active Self            Recommendation:   Specialty provider follow-up  [facility]  Follow Up Plan:   Telephone follow-up 2 weeks  Jenel Lucks, LCSW [facility]  [facility], [facility] Clinical Social Worker Direct Dial: [phone]  Fax: [phone] Website: [email or web address] 2:15 PM

## 2023-09-21 ENCOUNTER — Other Ambulatory Visit: Payer: Self-pay | Admitting: Licensed Clinical Social Worker

## 2023-09-21 NOTE — Patient Instructions (Signed)
 Visit Information  Thank you for taking time to visit with me today. Please don't hesitate to contact me if I can be of assistance to you before our next scheduled appointment.  Your next care management appointment is by telephone on 4/30 at 1:30 PM   Please call the care guide team at [phone] if you need to cancel, schedule, or reschedule an appointment.   Please call the Suicide and Crisis Lifeline: 988 go to [facility] Urgent [facility] [address] [place] [phone]) call 911 if you are experiencing a Mental Health or Behavioral Health Crisis or need someone to talk to.  Alease Hunter, LCSW [place]  [facility], [facility] Clinical Social Worker Direct Dial: [phone]  Fax: [phone] Website: [email or web address] 3:38 PM

## 2023-09-21 NOTE — Patient Outreach (Signed)
 Complex Care Management   Visit Note  09/21/2023  Name:  Scott Farrell MRN: [id number] DOB: Nov 17, 1980  Situation: Referral received for Complex Care Management related to Menta/Behavioral Health diagnosis Schizophrenia  I obtained verbal consent from Patient.  Visit completed with pt  on the phone  Background:   Past Medical History:  Diagnosis Date   Clotting disorder (HCC)    personal hx/o? etiology unclear   Costochondritis    history of   Diverticulitis    Herpes genitalia 06/2010   History of echocardiogram 01/07/2011   normal LV function, EF 60%, trace mitral, tricuspid and pulmonic regurgitation; Dr. Janie Meier   IBS (irritable bowel syndrome)    Mental disorder 2013   hospitalized for psychiatric illness prior   Normal cardiac stress test 01/07/2011   negative treadmill test, no evidence of ischemia, good exercise capacity; Janie Meier, MD;    Pre-diabetes    Prostatitis 11/2010   Dr. Ottelin   Renal stone 06/2012   Urology consult   Schizophrenia [facility])    [facility], [facility] - Dr. Darla Edward   Tobacco use disorder     Assessment: Patient Reported Symptoms:  Cognitive Cognitive Status: Alert and oriented to person, place, and time, Normal speech and language skills      Neurological Neurological Review of Symptoms: No symptoms reported    HEENT HEENT Symptoms Reported: No symptoms reported      Cardiovascular Cardiovascular Symptoms Reported: No symptoms reported    Respiratory Respiratory Symptoms Reported: No symptoms reported    Endocrine Patient reports the following symptoms related to hypoglycemia or hyperglycemia : No symptoms reported    Gastrointestinal Gastrointestinal Symptoms Reported: Abdominal pain or discomfort Additional Gastrointestinal Details: Patient reports having pain in middle of stomache, states minimal relief when he moves bowels Gastrointestinal Conditions: Abdominal pain Nutrition Risk Screen (CP): No  indicators present  Genitourinary Genitourinary Symptoms Reported: No symptoms reported    Integumentary Integumentary Symptoms Reported: No symptoms reported    Musculoskeletal Musculoskelatal Symptoms Reviewed: No symptoms reported   Falls in the past year?: No Number of falls in past year: 1 or less Was there an injury with Fall?: No Fall Risk Category Calculator: 0 Patient Fall Risk Level: Low Fall Risk    Psychosocial Psychosocial Symptoms Reported: Anxiety - if selected complete GAD, Depression - if selected complete PHQ 2-9 Additional Psychological Details: Pt reports decrease in gambling and alcohol use. Continues to endorse low energy/motivation and depressed mood with no SI/HI Behavioral Health Conditions: Other (Schizophrenia) Behavioral Management Strategies: Coping strategies, Counseling, Adequate rest, Support system Major Change/Loss/Stressor/Fears (CP): Medical condition, self Behaviors When Feeling Stressed/Fearful: alcohol/gambling Techniques to Cope with Loss/Stress/Change: Counseling, Diversional activities, Substance use, Medication Quality of Family Relationships: helpful, involved Do you feel physically threatened by others?: No      09/07/2023   12:51 PM  Depression screen PHQ 2/9  Decreased Interest 1  Down, Depressed, Hopeless 1  PHQ - 2 Score 2  Altered sleeping 3  Tired, decreased energy 3  Change in appetite 0  Feeling bad or failure about yourself  1  Trouble concentrating 1  Moving slowly or fidgety/restless 1  Suicidal thoughts 0  PHQ-9 Score 11    There were no vitals filed for this visit.  Medications Reviewed Today     Reviewed by Adriana Albany, LCSW (Social Worker) on 09/21/23 at 1245  Med List Status: <None>   Medication Order Taking? Sig Documenting Provider Last Dose Status Informant  atorvastatin  (LIPITOR)  20 MG tablet [id number] Yes Take 1 tablet (20 mg total) by mouth daily. Susanna Epley, FNP Taking Active   divalproex   (DEPAKOTE ) 500 MG DR tablet [id number] Yes Take 500 mg by mouth at bedtime. [provider] Taking Active Self  IBUPROFEN  PO [id number] No Take 1 tablet by mouth as needed.  Patient not taking: Reported on 09/07/2023   [provider] Not Taking Active Self  linaclotide  (LINZESS ) 145 MCG CAPS capsule [id number] No Take 1 capsule (145 mcg total) by mouth daily before breakfast.  Patient not taking: Reported on 03/04/2023   Edmonia Gottron, PA-C Not Taking Active Self  methocarbamol  (ROBAXIN -750) 750 MG tablet [id number] Yes Take 1 tablet (750 mg total) by mouth every 8 (eight) hours as needed for muscle spasms. Susanna Epley, FNP Taking Active   nicotine  (NICODERM CQ  - DOSED IN MG/24 HOURS) 14 mg/24hr patch [id number] No Place 1 patch (14 mg total) onto the skin daily.  Patient not taking: Reported on 08/11/2023   Susanna Epley, FNP Not Taking Active            Med Note Harles Lied, Davian Wollenberg D   Tue Sep 07, 2023 12:58 PM) Pt states he needs refill   polyethylene glycol powder (GLYCOLAX /MIRALAX ) 17 GM/SCOOP powder [id number] Yes 17 g or 1 capful mixed in 6 to 8 ounces of liquid and taken by mouth once daily for constipation. Ann Keto, MD Taking Active   risperidone (RISPERDAL) 4 MG tablet [id number] Yes Take 4 mg by mouth 2 (two) times daily. [provider] Taking Active Self            Recommendation:   Continue utilizing strategies discussed to assist with symptom management  Follow Up Plan:   Telephone follow-up in 1 week  Alease Hunter, LCSW [facility]  [facility], [facility] Clinical Social Worker Direct Dial: [phone]  Fax: [phone] Website: [email or web address] 3:37 PM

## 2023-09-29 ENCOUNTER — Other Ambulatory Visit: Payer: Self-pay | Admitting: Licensed Clinical Social Worker

## 2023-10-01 NOTE — Patient Outreach (Signed)
 Complex Care Management   Visit Note  09/29/2023  Name:  Scott Farrell MRN: [id number] DOB: 10-04-1980  Situation: Referral received for Complex Care Management related to Menta/Behavioral Health diagnosis Schizophrenia  I obtained verbal consent from Patient.  Visit completed with pt  on the phone  Background:   Past Medical History:  Diagnosis Date   Clotting disorder (HCC)    personal hx/o? etiology unclear   Costochondritis    history of   Diverticulitis    Herpes genitalia 06/2010   History of echocardiogram 01/07/2011   normal LV function, EF 60%, trace mitral, tricuspid and pulmonic regurgitation; Dr. Janie Meier   IBS (irritable bowel syndrome)    Mental disorder 2013   hospitalized for psychiatric illness prior   Normal cardiac stress test 01/07/2011   negative treadmill test, no evidence of ischemia, good exercise capacity; Janie Meier, MD;    Pre-diabetes    Prostatitis 11/2010   Dr. Ottelin   Renal stone 06/2012   Urology consult   Schizophrenia [facility])    [facility], [facility] - Dr. Darla Edward   Tobacco use disorder     Assessment: Patient Reported Symptoms:  Cognitive Cognitive Status: Alert and oriented to person, place, and time, Normal speech and language skills      Neurological Neurological Review of Symptoms: No symptoms reported    HEENT HEENT Symptoms Reported: No symptoms reported      Cardiovascular Cardiovascular Symptoms Reported: No symptoms reported    Respiratory Respiratory Symptoms Reported: No symptoms reported    Endocrine Patient reports the following symptoms related to hypoglycemia or hyperglycemia : No symptoms reported    Gastrointestinal Gastrointestinal Symptoms Reported: No symptoms reported Additional Gastrointestinal Details: Patient reports stomache aches have decreased with the use of Miralax       Genitourinary Genitourinary Symptoms Reported: No symptoms reported    Integumentary Integumentary  Symptoms Reported: No symptoms reported    Musculoskeletal Musculoskelatal Symptoms Reviewed: No symptoms reported        Psychosocial Psychosocial Symptoms Reported: Depression - if selected complete PHQ 2-9, Anxiety - if selected complete GAD Additional Psychological Details: Patient reports symptoms of depression and anxiety have remained the same Behavioral Health Conditions: Other (Schizophrenia) Behavioral Management Strategies: Adequate rest, Coping strategies Major Change/Loss/Stressor/Fears (CP): Medical condition, self Quality of Family Relationships: helpful, involved Do you feel physically threatened by others?: No      09/07/2023   12:51 PM  Depression screen PHQ 2/9  Decreased Interest 1  Down, Depressed, Hopeless 1  PHQ - 2 Score 2  Altered sleeping 3  Tired, decreased energy 3  Change in appetite 0  Feeling bad or failure about yourself  1  Trouble concentrating 1  Moving slowly or fidgety/restless 1  Suicidal thoughts 0  PHQ-9 Score 11    There were no vitals filed for this visit.  Medications Reviewed Today     Reviewed by Adriana Albany, LCSW (Social Worker) on 10/01/23 at 1711  Med List Status: <None>   Medication Order Taking? Sig Documenting Provider Last Dose Status Informant  atorvastatin  (LIPITOR) 20 MG tablet [id number] No Take 1 tablet (20 mg total) by mouth daily. Susanna Epley, FNP Taking Active   divalproex  (DEPAKOTE ) 500 MG DR tablet [id number] No Take 500 mg by mouth at bedtime. [provider] Taking Active Self  IBUPROFEN  PO [id number] No Take 1 tablet by mouth as needed.  Patient not taking: Reported on 09/07/2023   [provider] Not Taking Active Self  linaclotide  (LINZESS ) 145 MCG CAPS capsule [id number] No Take 1 capsule (145 mcg total) by mouth daily before breakfast.  Patient not taking: Reported on 03/04/2023   Edmonia Gottron, PA-C Not Taking Active Self  methocarbamol  (ROBAXIN -750) 750 MG tablet [id number] No Take  1 tablet (750 mg total) by mouth every 8 (eight) hours as needed for muscle spasms. Susanna Epley, FNP Taking Active   nicotine  (NICODERM CQ  - DOSED IN MG/24 HOURS) 14 mg/24hr patch [id number] No Place 1 patch (14 mg total) onto the skin daily.  Patient not taking: Reported on 08/11/2023   Susanna Epley, FNP Not Taking Active            Med Note Harles Lied, Yulanda Diggs D   Tue Sep 07, 2023 12:58 PM) Pt states he needs refill   polyethylene glycol powder (GLYCOLAX /MIRALAX ) 17 GM/SCOOP powder [id number] No 17 g or 1 capful mixed in 6 to 8 ounces of liquid and taken by mouth once daily for constipation. Ann Keto, MD Taking Active   risperidone (RISPERDAL) 4 MG tablet [id number] No Take 4 mg by mouth 2 (two) times daily. [provider] Taking Active Self            Recommendation:   Continue utilizing strategies discussed to assist with symptom management  Follow Up Plan:   Telephone follow-up 2-4 weeks  Alease Hunter, LCSW [place]  [facility], [facility] Clinical Social Worker Direct Dial: [phone]  Fax: [phone] Website: [email or web address] 5:31 PM

## 2023-10-01 NOTE — Patient Instructions (Signed)
 Visit Information  Thank you for taking time to visit with me today. Please don't hesitate to contact me if I can be of assistance to you before our next scheduled appointment.  Your next care management appointment is by telephone on 5/14 at 12:30 PM   Please call the care guide team at [phone] if you need to cancel, schedule, or reschedule an appointment.   Please call the Suicide and Crisis Lifeline: 988 go to [facility] Urgent [facility] [address], [place] [phone]) call 911 if you are experiencing a Mental Health or Behavioral Health Crisis or need someone to talk to.  Alease Hunter, LCSW   [facility], [facility] Clinical Social Worker Direct Dial: [phone]  Fax: [phone] Website: [email or web address] 5:31 PM

## 2023-10-13 ENCOUNTER — Encounter: Payer: Self-pay | Admitting: Licensed Clinical Social Worker

## 2023-10-13 ENCOUNTER — Telehealth: Payer: Self-pay | Admitting: Licensed Clinical Social Worker

## 2023-11-03 ENCOUNTER — Other Ambulatory Visit: Payer: Self-pay | Admitting: Licensed Clinical Social Worker

## 2023-11-08 NOTE — Patient Instructions (Signed)
 Visit Information  Thank you for taking time to visit with me today. Please don't hesitate to contact me if I can be of assistance to you before our next scheduled appointment.  Your next care management appointment is by telephone on 06/17 at 3:30 PM  Please call the care guide team at [phone] if you need to cancel, schedule, or reschedule an appointment.   Please call the Suicide and Crisis Lifeline: 988 call the [place]  National Suicide Prevention Lifeline: 364-143-0525 or TTY: 971-564-7302 TTY [phone]) to talk to a trained counselor call 911 if you are experiencing a Mental Health or Behavioral Health Crisis or need someone to talk to.  Alease Hunter, LCSW   [facility], [facility] Clinical Social Worker Direct Dial: [phone]  Fax: [phone] Website: [email or web address] 9:28 AM

## 2023-11-08 NOTE — Patient Outreach (Signed)
 Complex Care Management   Visit Note  11/03/2023  Name:  Scott Farrell MRN: [id number] DOB: 1980/07/17  Situation: Referral received for Complex Care Management related to Mental/Behavioral Health diagnosis Schizophrenia I obtained verbal consent from Patient.  Visit completed with pt  on the phone  Background:   Past Medical History:  Diagnosis Date   Clotting disorder (HCC)    personal hx/o? etiology unclear   Costochondritis    history of   Diverticulitis    Herpes genitalia 06/2010   History of echocardiogram 01/07/2011   normal LV function, EF 60%, trace mitral, tricuspid and pulmonic regurgitation; Dr. Janie Meier   IBS (irritable bowel syndrome)    Mental disorder 2013   hospitalized for psychiatric illness prior   Normal cardiac stress test 01/07/2011   negative treadmill test, no evidence of ischemia, good exercise capacity; Janie Meier, MD;    Pre-diabetes    Prostatitis 11/2010   Dr. Ottelin   Renal stone 06/2012   Urology consult   Schizophrenia [facility])    [facility], [facility] - Dr. Darla Edward   Tobacco use disorder     Assessment: Patient Reported Symptoms:  Cognitive Cognitive Status: Alert and oriented to person, place, and time, Normal speech and language skills Cognitive/Intellectual Conditions Management [RPT]: None reported or documented in medical history or problem list      Neurological Neurological Review of Symptoms: Not assessed    HEENT HEENT Symptoms Reported: Not assessed      Cardiovascular Cardiovascular Symptoms Reported: Not assessed    Respiratory Respiratory Symptoms Reported: Not assesed    Endocrine Patient reports the following symptoms related to hypoglycemia or hyperglycemia : Not assessed    Gastrointestinal Gastrointestinal Symptoms Reported: Not assessed      Genitourinary Genitourinary Symptoms Reported: Not assessed    Integumentary Integumentary Symptoms Reported: Not assessed    Musculoskeletal  Musculoskelatal Symptoms Reviewed: Not assessed        Psychosocial Additional Psychological Details: Patient reports he is doing well. Reported to LCSW that he has a job interview. LCSW commended pt for his hard work and provided Agricultural consultant Health Conditions: Other Other Behavorial Health Conditions: Schizophrenia Behavioral Management Strategies: Adequate rest, Coping strategies Major Change/Loss/Stressor/Fears (CP): Medical condition, self Behaviors When Feeling Stressed/Fearful: alcohol/gambling Techniques to Cope with Loss/Stress/Change: Diversional activities, Medication Quality of Family Relationships: involved, helpful      09/07/2023   12:51 PM  Depression screen PHQ 2/9  Decreased Interest 1  Down, Depressed, Hopeless 1  PHQ - 2 Score 2  Altered sleeping 3  Tired, decreased energy 3  Change in appetite 0  Feeling bad or failure about yourself  1  Trouble concentrating 1  Moving slowly or fidgety/restless 1  Suicidal thoughts 0  PHQ-9 Score 11    There were no vitals filed for this visit.  Medications Reviewed Today   Medications were not reviewed in this encounter     Recommendation:   Continue Current Plan of Care  Follow Up Plan:   Telephone follow-up 1-2 weeks  Alease Hunter, LCSW [place]  [facility], [facility] Clinical Social Worker Direct Dial: [phone]  Fax: [phone] Website: [email or web address] 9:27 AM

## 2023-11-09 DIAGNOSIS — F251 Schizoaffective disorder, depressive type: Secondary | ICD-10-CM | POA: Diagnosis not present

## 2023-11-16 ENCOUNTER — Other Ambulatory Visit: Payer: Self-pay | Admitting: Licensed Clinical Social Worker

## 2023-11-16 NOTE — Patient Outreach (Signed)
 Complex Care Management   Visit Note  11/16/2023  Name:  Scott Farrell MRN: [id number] DOB: 1980-09-03  Situation: Referral received for Complex Care Management related to Mental/Behavioral Health diagnosis Schizophrenia I obtained verbal consent from Patient.  Visit completed with pt  on the phone  Background:   Past Medical History:  Diagnosis Date   Clotting disorder (HCC)    personal hx/o? etiology unclear   Costochondritis    history of   Diverticulitis    Herpes genitalia 06/2010   History of echocardiogram 01/07/2011   normal LV function, EF 60%, trace mitral, tricuspid and pulmonic regurgitation; Dr. Janie Meier   IBS (irritable bowel syndrome)    Mental disorder 2013   hospitalized for psychiatric illness prior   Normal cardiac stress test 01/07/2011   negative treadmill test, no evidence of ischemia, good exercise capacity; Janie Meier, MD;    Pre-diabetes    Prostatitis 11/2010   Dr. Ottelin   Renal stone 06/2012   Urology consult   Schizophrenia [facility])    [facility], [facility] - Dr. Darla Edward   Tobacco use disorder     Assessment: Patient Reported Symptoms:  Cognitive Cognitive Status: Alert and oriented to person, place, and time, Normal speech and language skills Cognitive/Intellectual Conditions Management [RPT]: None reported or documented in medical history or problem list   Health Maintenance Behaviors: Stress management  Neurological Neurological Review of Symptoms: No symptoms reported    HEENT HEENT Symptoms Reported: No symptoms reported      Cardiovascular Cardiovascular Symptoms Reported: No symptoms reported    Respiratory Respiratory Symptoms Reported: No symptoms reported    Endocrine Patient reports the following symptoms related to hypoglycemia or hyperglycemia : No symptoms reported Is patient diabetic?: No    Gastrointestinal Gastrointestinal Symptoms Reported: Abdominal pain or discomfort Additional  Gastrointestinal Details: Patient reports having sharp pains occassionally. LCSW inquired about any patterns and pt identified it occurs after ingesting dairy products. Pt will address with PCP at upcoming appt      Genitourinary Genitourinary Symptoms Reported: No symptoms reported    Integumentary Integumentary Symptoms Reported: No symptoms reported    Musculoskeletal Musculoskelatal Symptoms Reviewed: No symptoms reported        Psychosocial Psychosocial Symptoms Reported: Other Other Psychosocial Conditions: Stress Additional Psychological Details: Patient reports interest in looking for employment. LCSW commended pt and encouraged him to use strategies to assist with fostering self-compassion and promoting healthy self-talk. He is doing well with decreasing substance use and gambling. Behavioral Health Conditions: Other Other Behavorial Health Conditions: Schizophrenia Behavioral Management Strategies: Adequate rest, Coping strategies, Support system, Medication therapy Behavioral Health Self-Management Outcome: 4 (good) Major Change/Loss/Stressor/Fears (CP): Medical condition, self        09/07/2023   12:51 PM  Depression screen PHQ 2/9  Decreased Interest 1  Down, Depressed, Hopeless 1  PHQ - 2 Score 2  Altered sleeping 3  Tired, decreased energy 3  Change in appetite 0  Feeling bad or failure about yourself  1  Trouble concentrating 1  Moving slowly or fidgety/restless 1  Suicidal thoughts 0  PHQ-9 Score 11    There were no vitals filed for this visit.  Medications Reviewed Today     Reviewed by Adriana Albany, LCSW (Social Worker) on 11/16/23 at 1545  Med List Status: <None>   Medication Order Taking? Sig Documenting Provider Last Dose Status Informant  atorvastatin  (LIPITOR) 20 MG tablet [id number] No Take 1 tablet (20 mg total) by mouth daily. Sulema Endo,  Abelino Able, FNP Taking Active   divalproex  (DEPAKOTE ) 500 MG DR tablet [id number] No Take 500 mg by mouth at  bedtime. [provider] Taking Active Self  IBUPROFEN  PO [id number] No Take 1 tablet by mouth as needed.  Patient not taking: Reported on 09/07/2023   [provider] Not Taking Active Self  linaclotide  (LINZESS ) 145 MCG CAPS capsule [id number] No Take 1 capsule (145 mcg total) by mouth daily before breakfast.  Patient not taking: Reported on 03/04/2023   Edmonia Gottron, PA-C Not Taking Active Self  methocarbamol  (ROBAXIN -750) 750 MG tablet [id number] No Take 1 tablet (750 mg total) by mouth every 8 (eight) hours as needed for muscle spasms. Susanna Epley, FNP Taking Active   nicotine  (NICODERM CQ  - DOSED IN MG/24 HOURS) 14 mg/24hr patch [id number] No Place 1 patch (14 mg total) onto the skin daily.  Patient not taking: Reported on 08/11/2023   Susanna Epley, FNP Not Taking Active            Med Note Harles Lied, Hogan Hoobler D   Tue Sep 07, 2023 12:58 PM) Pt states he needs refill   polyethylene glycol powder (GLYCOLAX /MIRALAX ) 17 GM/SCOOP powder [id number] No 17 g or 1 capful mixed in 6 to 8 ounces of liquid and taken by mouth once daily for constipation. Ann Keto, MD Taking Active   risperidone (RISPERDAL) 4 MG tablet [id number] No Take 4 mg by mouth 2 (two) times daily. [provider] Taking Active Self            Recommendation:   PCP Follow-up Continue Current Plan of Care  Follow Up Plan:   Telephone follow-up in 1 month  Alease Hunter, LCSW [facility]  [facility], [facility] Clinical Social Worker Direct Dial: [phone]  Fax: [phone] Website: [email or web address] 4:08 PM

## 2023-11-16 NOTE — Patient Instructions (Signed)
 Visit Information  Thank you for taking time to visit with me today. Please don't hesitate to contact me if I can be of assistance to you before our next scheduled appointment.  Your next care management appointment is by telephone on 07/16 at 3:30 PM  Please call the care guide team at [phone] if you need to cancel, schedule, or reschedule an appointment.   Please call the Suicide and Crisis Lifeline: 988 go to [facility] Urgent [facility] [address], [place] [phone]) call 911 if you are experiencing a Mental Health or Behavioral Health Crisis or need someone to talk to.  Alease Hunter, LCSW [place]  [facility], [facility] Clinical Social Worker Direct Dial: [phone]  Fax: [phone] Website: [email or web address] 4:09 PM

## 2023-11-19 ENCOUNTER — Encounter (HOSPITAL_COMMUNITY): Payer: Self-pay

## 2023-11-19 ENCOUNTER — Ambulatory Visit (HOSPITAL_COMMUNITY)
Admission: EM | Admit: 2023-11-19 | Discharge: 2023-11-19 | Disposition: A | Discharge status: Home / Self Care | Attending: Family Medicine | Admitting: Family Medicine

## 2023-11-19 DIAGNOSIS — R350 Frequency of micturition: Secondary | ICD-10-CM | POA: Diagnosis not present

## 2023-11-19 DIAGNOSIS — R300 Dysuria: Secondary | ICD-10-CM | POA: Diagnosis not present

## 2023-11-19 DIAGNOSIS — R7303 Prediabetes: Secondary | ICD-10-CM | POA: Diagnosis not present

## 2023-11-19 DIAGNOSIS — F17210 Nicotine dependence, cigarettes, uncomplicated: Secondary | ICD-10-CM | POA: Insufficient documentation

## 2023-11-19 DIAGNOSIS — R309 Painful micturition, unspecified: Secondary | ICD-10-CM | POA: Insufficient documentation

## 2023-11-19 DIAGNOSIS — F17290 Nicotine dependence, other tobacco product, uncomplicated: Secondary | ICD-10-CM | POA: Insufficient documentation

## 2023-11-19 DIAGNOSIS — N410 Acute prostatitis: Secondary | ICD-10-CM | POA: Diagnosis not present

## 2023-11-19 LAB — POCT URINALYSIS DIP (MANUAL ENTRY)
Bilirubin, UA: NEGATIVE
Glucose, UA: NEGATIVE mg/dL
Ketones, POC UA: NEGATIVE mg/dL
Leukocytes, UA: NEGATIVE
Nitrite, UA: NEGATIVE
Protein Ur, POC: NEGATIVE mg/dL
Spec Grav, UA: 1.015 (ref 1.010–1.025)
Urobilinogen, UA: 1 U/dL
pH, UA: 7 (ref 5.0–8.0)

## 2023-11-19 MED ORDER — CIPROFLOXACIN HCL 500 MG PO TABS: 500.0000 mg | ORAL_TABLET | Freq: Two times a day (BID) | ORAL | 0 refills | Status: AC

## 2023-11-19 NOTE — ED Provider Notes (Signed)
 [facility]    CSN: [id number] Arrival date & time: 11/19/23  1545      History   Chief Complaint Chief Complaint  Patient presents with   Urinary Frequency    HPI Scott Farrell is a 43 y.o. male.    Urinary Frequency  Here for urinary frequency that has been going on for 2 months.  He also has had some stinging or tingling/burning when he urinates.  The pain on urination has been bothering him most of the time but not exactly every time he urinates.  No penile itching or discharge.  He does have some pelvic pressure and low back pain.  He has had some polyphagia and polydipsia also.  No fever or chills  He denies history of unprotected sex recently.  He does have prediabetes  He is allergic to metformin    Past Medical History:  Diagnosis Date   Clotting disorder (HCC)    personal hx/o? etiology unclear   Costochondritis    history of   Diverticulitis    Herpes genitalia 06/2010   History of echocardiogram 01/07/2011   normal LV function, EF 60%, trace mitral, tricuspid and pulmonic regurgitation; Dr. Janie Meier   IBS (irritable bowel syndrome)    Mental disorder 2013   hospitalized for psychiatric illness prior   Normal cardiac stress test 01/07/2011   negative treadmill test, no evidence of ischemia, good exercise capacity; Janie Meier, MD;    Pre-diabetes    Prostatitis 11/2010   Dr. Ottelin   Renal stone 06/2012   Urology consult   Schizophrenia [facility])    [facility], [facility] - Dr. Darla Edward   Tobacco use disorder     Patient Active Problem List   Diagnosis Date Noted   Influenza vaccination declined 06/23/2023   Muscle tension pain 06/23/2023   COVID-19 vaccination declined 06/23/2023   History of herpes genitalis 12/13/2022   Snoring 12/09/2022   Other fatigue 12/09/2022   Subcutaneous nodule of head 12/09/2022   Mixed hyperlipidemia 12/09/2022   Other schizophrenia (HCC) 08/05/2022   Lumbar radiculopathy  11/27/2021   Metatarsalgia of both feet 11/27/2021   Chest pain of uncertain etiology 09/19/2020   Tobacco abuse 09/19/2020   Prediabetes 09/13/2020   High triglycerides 09/13/2020   Other headache syndrome 07/31/2019   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/31/2019   Lightheaded 07/31/2019   Callus of foot 01/27/2018   Constipation 04/27/2012   Family history of malignant neoplasm of gastrointestinal tract 04/27/2012   Gastric mass 04/21/2012    Past Surgical History:  Procedure Laterality Date   EUS  06/30/2012   Procedure: UPPER ENDOSCOPIC ULTRASOUND (EUS) LINEAR;  Surgeon: Janel Medford, MD;  Location: [facility];  Service: Endoscopy;  Laterality: N/A;  radial linear   EXCISION MASS HEAD Right 03/12/2023   Procedure: EXCISION OF FOREHEAD TUMOR (LIPOMA);  Surgeon: Rush Coupe, MD;  Location: [facility] OR;  Service: ENT;  Laterality: Right;   WISDOM TOOTH EXTRACTION         Home Medications    Prior to Admission medications   Medication Sig Start Date End Date Taking? Authorizing Provider  ciprofloxacin  (CIPRO ) 500 MG tablet Take 1 tablet (500 mg total) by mouth 2 (two) times daily for 10 days. 11/19/23 11/29/23 Yes Catharine Kettlewell, Paige Boatman, MD  atorvastatin  (LIPITOR) 20 MG tablet Take 1 tablet (20 mg total) by mouth daily. 06/16/23   Susanna Epley, FNP  divalproex  (DEPAKOTE ) 500 MG DR tablet Take 500 mg by mouth at bedtime. 08/06/20  [provider]  IBUPROFEN  PO Take 1 tablet by mouth as needed. Patient not taking: Reported on 09/07/2023    [provider]  methocarbamol  (ROBAXIN -750) 750 MG tablet Take 1 tablet (750 mg total) by mouth every 8 (eight) hours as needed for muscle spasms. 06/14/23   Susanna Epley, FNP  nicotine  (NICODERM CQ  - DOSED IN MG/24 HOURS) 14 mg/24hr patch Place 1 patch (14 mg total) onto the skin daily. Patient not taking: Reported on 08/11/2023 06/14/23 06/13/24  Moore, Janece, FNP  risperidone (RISPERDAL) 4 MG tablet Take 4 mg by mouth 2 (two) times daily. 03/24/21    [provider]    Family History Family History  Problem Relation Age of Onset   Hypertension Mother    Stroke Mother    Depression Mother    Acne Mother    Thyroid  disease Mother    Diabetes Mother    Colon cancer Mother 59   Acute lymphoblastic leukemia Father    Diabetes Father    Cancer Brother    Breast cancer Maternal Aunt    Ovarian cancer Maternal Aunt    Esophageal cancer Neg Hx    Stomach cancer Neg Hx     Social History Social History   Tobacco Use   Smoking status: Every Day    Current packs/day: 0.25    Average packs/day: 0.3 packs/day for 23.0 years (5.8 ttl pk-yrs)    Types: Cigars, Cigarettes    Passive exposure: Never   Smokeless tobacco: Never   Tobacco comments:    07/10/2021, smokes 4 [place] cigars daily, he is using a nicotine  patch  Vaping Use   Vaping status: Former   Substances: Nicotine   Substance Use Topics   Alcohol use: Yes    Comment: occ   Drug use: Not Currently     Allergies   Metformin  and related   Review of Systems Review of Systems  Genitourinary:  Positive for frequency.     Physical Exam Triage Vital Signs ED Triage Vitals  Encounter Vitals Group     BP 11/19/23 1606 107/68     Girls Systolic BP Percentile --      Girls Diastolic BP Percentile --      Boys Systolic BP Percentile --      Boys Diastolic BP Percentile --      Pulse Rate 11/19/23 1606 86     Resp 11/19/23 1606 16     Temp 11/19/23 1606 98.1 F (36.7 C)     Temp Source 11/19/23 1606 Oral     SpO2 11/19/23 1606 92 %     Weight --      Height --      Head Circumference --      Peak Flow --      Pain Score 11/19/23 1608 5     Pain Loc --      Pain Education --      Exclude from Growth Chart --    No data found.  Updated Vital Signs BP 107/68 (BP Location: Left Arm)   Pulse 86   Temp 98.1 F (36.7 C) (Oral)   Resp 16   SpO2 92%   Visual Acuity Right Eye Distance:   Left Eye Distance:   Bilateral Distance:    Right Eye  Near:   Left Eye Near:    Bilateral Near:     Physical Exam Vitals reviewed.  Constitutional:      General: He is not in acute distress.  Appearance: He is not ill-appearing, toxic-appearing or diaphoretic.  HENT:     Mouth/Throat:     Mouth: Mucous membranes are moist.  Abdominal:     Comments: There is maybe a little tenderness in the left lower quadrant.   Skin:    Coloration: Skin is not pale.   Neurological:     Mental Status: He is alert.      UC Treatments / Results  Labs (all labs ordered are listed, but only abnormal results are displayed) Labs Reviewed  POCT URINALYSIS DIP (MANUAL ENTRY) - Abnormal; Notable for the following components:      Result Value   Color, UA light yellow (*)    Blood, UA trace-intact (*)    All other components within normal limits  URINE CULTURE  CYTOLOGY, (ORAL, ANAL, URETHRAL) ANCILLARY ONLY    EKG   Radiology No results found.  Procedures Procedures (including critical care time)  Medications Ordered in UC Medications - No data to display  Initial Impression / Assessment and Plan / UC Course  I have reviewed the triage vital signs and the nursing notes.  Pertinent labs & imaging results that were available during my care of the patient were reviewed by me and considered in my medical decision making (see chart for details).     Urinalysis shows only a trace of blood.  There are no leukocytes and there is no sugar  Urethral self swab is done and staff will notify him of any positives and treat per protocol.  Since he has some pelvic pressure and frequency, I am going to go ahead and send in Cipro  for possible prostatitis.  If he has something positive on his swab, we can change antibiotic then.   Final Clinical Impressions(s) / UC Diagnoses   Final diagnoses:  Urinary frequency  Acute prostatitis  Dysuria     Discharge Instructions      The urinalysis showed just a trace of blood, but no white blood  cells and no sugar.  Take Cipro  500 mg--1 tablet 2 times daily for 10 days  Urine culture is sent, and staff will notify you that it looks like the antibiotic needs to be changed.  Staff will notify you if there is anything positive on the swab.  Follow-up with your primary care about this issue      ED Prescriptions     Medication Sig Dispense Auth. Provider   ciprofloxacin  (CIPRO ) 500 MG tablet Take 1 tablet (500 mg total) by mouth 2 (two) times daily for 10 days. 20 tablet Everhett Bozard K, MD      PDMP not reviewed this encounter.   Ann Keto, MD 11/19/23 1640

## 2023-11-19 NOTE — Discharge Instructions (Signed)
 The urinalysis showed just a trace of blood, but no white blood cells and no sugar.  Take Cipro  500 mg--1 tablet 2 times daily for 10 days  Urine culture is sent, and staff will notify you that it looks like the antibiotic needs to be changed.  Staff will notify you if there is anything positive on the swab.  Follow-up with your primary care about this issue

## 2023-11-19 NOTE — ED Triage Notes (Signed)
 Patient here today with c/o frequent urination X 2 months. Patient states that he also has some tingling in urination. Patient also has some fatigue off and on.

## 2023-11-20 LAB — URINE CULTURE: Culture: NO GROWTH

## 2023-11-22 ENCOUNTER — Ambulatory Visit (HOSPITAL_COMMUNITY): Payer: Self-pay

## 2023-11-22 LAB — CYTOLOGY, (ORAL, ANAL, URETHRAL) ANCILLARY ONLY
Chlamydia: NEGATIVE
Comment: NEGATIVE
Comment: NEGATIVE
Comment: NORMAL
Neisseria Gonorrhea: NEGATIVE
Trichomonas: NEGATIVE

## 2023-12-14 ENCOUNTER — Encounter: Payer: Self-pay | Admitting: Nurse Practitioner

## 2023-12-14 ENCOUNTER — Ambulatory Visit: Payer: Medicare PPO | Admitting: Nurse Practitioner

## 2023-12-14 VITALS — BP 110/70 | HR 61 | Temp 98.0°F | Ht 77.0 in | Wt 249.0 lb

## 2023-12-14 DIAGNOSIS — Z87438 Personal history of other diseases of male genital organs: Secondary | ICD-10-CM | POA: Diagnosis not present

## 2023-12-14 DIAGNOSIS — K5900 Constipation, unspecified: Secondary | ICD-10-CM | POA: Diagnosis not present

## 2023-12-14 DIAGNOSIS — Z79899 Other long term (current) drug therapy: Secondary | ICD-10-CM | POA: Diagnosis not present

## 2023-12-14 DIAGNOSIS — E782 Mixed hyperlipidemia: Secondary | ICD-10-CM

## 2023-12-14 DIAGNOSIS — R1012 Left upper quadrant pain: Secondary | ICD-10-CM | POA: Diagnosis not present

## 2023-12-14 DIAGNOSIS — M791 Myalgia, unspecified site: Secondary | ICD-10-CM | POA: Diagnosis not present

## 2023-12-14 DIAGNOSIS — R7303 Prediabetes: Secondary | ICD-10-CM | POA: Diagnosis not present

## 2023-12-14 DIAGNOSIS — Z0000 Encounter for general adult medical examination without abnormal findings: Secondary | ICD-10-CM

## 2023-12-14 DIAGNOSIS — Z139 Encounter for screening, unspecified: Secondary | ICD-10-CM | POA: Diagnosis not present

## 2023-12-14 DIAGNOSIS — F2089 Other schizophrenia: Secondary | ICD-10-CM | POA: Diagnosis not present

## 2023-12-14 DIAGNOSIS — Z125 Encounter for screening for malignant neoplasm of prostate: Secondary | ICD-10-CM | POA: Diagnosis not present

## 2023-12-14 NOTE — Progress Notes (Signed)
 LILLETTE Kristeen JINNY Gladis, CMA,acting as a Neurosurgeon for Scott Ada, FNP.,have documented all relevant documentation on the behalf of Scott Ada, FNP,as directed by  Scott Ada, FNP while in the presence of Scott Ada, FNP.  Subjective:   Patient ID: Scott Farrell , male    DOB: Jul 18, 1980 , 43 y.o.   MRN: [id number]  Chief Complaint  Patient presents with   Annual Exam    Patient presents today for HM, Patient reports compliance with medication. Patient denies any chest pain, SOB, or headaches. Patient has no concerns today.     HPI Discussed the use of AI scribe software for clinical note transcription with the patient, who gave verbal consent to proceed.  History of Present Illness Scott Farrell is a 43 year old male who presents for an annual physical exam.  He has been experiencing significant constipation, consuming two to three bottles of water daily, and eating a diet high in pasta with limited vegetable intake.  He was seen at [facility] on June 20th for urinary frequency and was prescribed ciprofloxacin  for prostatitis, which he refers to as 'prostatitis'.  He experiences neck tightness and uses methocarbamol  as needed for muscle tension pain. He has not sought physical therapy or chiropractic care for this issue.  He has a concern regarding his atorvastatin  medication, noting that the pills received via mail order are of different sizes compared to previous prescriptions, possibly due to a change in manufacturer.  He is currently seeing behavioral health professionals, including a psychiatrist, with no changes to his medications. He describes himself as 'pretty active' upon waking but is not exercising regularly. He is not currently working and does not follow a specific diet, though he consumes a regular diet without avoiding any particular foods.  No trouble swallowing, chest pain, or shortness of breath.  Past Medical History:  Diagnosis Date   Clotting  disorder (HCC)    personal hx/o? etiology unclear   Costochondritis    history of   Diverticulitis    Herpes genitalia 06/2010   History of echocardiogram 01/07/2011   normal LV function, EF 60%, trace mitral, tricuspid and pulmonic regurgitation; Dr. Jacques Somerset   IBS (irritable bowel syndrome)    Mental disorder 2013   hospitalized for psychiatric illness prior   Normal cardiac stress test 01/07/2011   negative treadmill test, no evidence of ischemia, good exercise capacity; Jacques Somerset, MD;    Pre-diabetes    Prostatitis 11/2010   Dr. Ottelin   Renal stone 06/2012   Urology consult   Schizophrenia [facility])    [facility], [facility] - Dr. Zell Harbour   Tobacco use disorder      Family History  Problem Relation Age of Onset   Hypertension Mother    Stroke Mother    Depression Mother    Acne Mother    Thyroid  disease Mother    Diabetes Mother    Colon cancer Mother 64   Acute lymphoblastic leukemia Father    Diabetes Father    Cancer Brother    Breast cancer Maternal Aunt    Ovarian cancer Maternal Aunt    Esophageal cancer Neg Hx    Stomach cancer Neg Hx      Current Outpatient Medications:    atorvastatin  (LIPITOR) 20 MG tablet, Take 1 tablet (20 mg total) by mouth daily., Disp: 90 tablet, Rfl: 1   divalproex  (DEPAKOTE ) 500 MG DR tablet, Take 500 mg by mouth at bedtime., Disp: , Rfl:  IBUPROFEN  PO, Take 1 tablet by mouth as needed., Disp: , Rfl:    methocarbamol  (ROBAXIN -750) 750 MG tablet, Take 1 tablet (750 mg total) by mouth every 8 (eight) hours as needed for muscle spasms., Disp: 30 tablet, Rfl: 0   risperidone (RISPERDAL) 4 MG tablet, Take 4 mg by mouth 2 (two) times daily., Disp: , Rfl:    nicotine  (NICODERM CQ  - DOSED IN MG/24 HOURS) 14 mg/24hr patch, Place 1 patch (14 mg total) onto the skin daily. (Patient not taking: Reported on 12/14/2023), Disp: 90 patch, Rfl: 0   Allergies  Allergen Reactions   Metformin  And Related Other (See Comments)     Chest tightness     Men's preventive visit. Patient Health Questionnaire (PHQ-2) is  Flowsheet Row Office Visit from 12/14/2023 in [facility]  PHQ-2 Total Score 2  Patient is on a Regular diet. Exercising - minimal but admits to being active. He is not working at this time. Marital status: Single. Relevant history for alcohol use is:  Social History   Substance and Sexual Activity  Alcohol Use Yes   Comment: occ  Relevant history for tobacco use is:  Social History   Tobacco Use  Smoking Status Every Day   Current packs/day: 0.25   Average packs/day: 0.3 packs/day for 23.0 years (5.8 ttl pk-yrs)   Types: Cigars, Cigarettes   Passive exposure: Never  Smokeless Tobacco Never  Tobacco Comments   07/10/2021, smokes 4 [place] cigars daily, he is using a nicotine  patch  .   Review of Systems  Constitutional: Negative.   HENT: Negative.    Respiratory: Negative.    Cardiovascular: Negative.   Skin: Negative.   Allergic/Immunologic: Negative.   Neurological: Negative.   Hematological: Negative.      Today's Vitals   12/14/23 1107  BP: 110/70  Pulse: 61  Temp: 98 F (36.7 C)  SpO2: 98%  Weight: 249 lb (112.9 kg)  Height: 6' 5 (1.956 m)   Body mass index is 29.53 kg/m.  Wt Readings from Last 3 Encounters:  12/14/23 249 lb (112.9 kg)  08/11/23 248 lb 12.8 oz (112.9 kg)  06/14/23 250 lb (113.4 kg)    Objective:  Physical Exam Vitals and nursing note reviewed.  Constitutional:      General: He is not in acute distress.    Appearance: Normal appearance. He is obese.  HENT:     Head: Normocephalic and atraumatic.     Right Ear: Tympanic membrane, ear canal and external ear normal. There is no impacted cerumen.     Left Ear: Tympanic membrane, ear canal and external ear normal. There is no impacted cerumen.     Nose: Nose normal.     Mouth/Throat:     Mouth: Mucous membranes are moist.  Cardiovascular:     Rate and Rhythm:  Normal rate and regular rhythm.     Pulses: Normal pulses.     Heart sounds: Normal heart sounds. No murmur heard. Pulmonary:     Effort: Pulmonary effort is normal. No respiratory distress.     Breath sounds: Normal breath sounds. No wheezing.  Abdominal:     General: Abdomen is flat. Bowel sounds are normal. There is no distension.     Palpations: Abdomen is soft.     Tenderness: There is abdominal tenderness (left upper quadrant).  Genitourinary:    Prostate: Normal.     Rectum: Guaiac result negative.  Musculoskeletal:        General:  Normal range of motion.     Cervical back: Normal range of motion and neck supple.  Skin:    General: Skin is warm.     Capillary Refill: Capillary refill takes less than 2 seconds.  Neurological:     General: No focal deficit present.     Mental Status: He is alert and oriented to person, place, and time.     Cranial Nerves: No cranial nerve deficit.  Psychiatric:        Mood and Affect: Mood normal.        Behavior: Behavior normal.        Thought Content: Thought content normal.        Judgment: Judgment normal.         Assessment And Plan:    Encounter for annual health examination Assessment & Plan: Not exercising regularly, consumes fast food impacting health. - Encourage regular physical activity. - Advise reducing fast food intake and increasing consumption of a balanced diet with more vegetables and fruits. - vaccines are up to date   Mixed hyperlipidemia Assessment & Plan: Cholesterol levels have improved.  Continue focusing on low-fat diet and taking statin.  Tolerating well. Received atorvastatin  pills of different sizes, advised to verify manufacturer change with pharmacy. - Contact mail order pharmacy to verify manufacturer change.  Orders: -     CMP14+EGFR -     Lipid panel  Other schizophrenia (HCC) Assessment & Plan: Continue follow-up with behavioral health.  No recent changes per  patient.   Prediabetes Assessment & Plan: Hemoglobin A1c is stable. Will check A1c   Orders: -     CMP14+EGFR -     Hemoglobin A1c  History of prostatitis Assessment & Plan: Urinary frequency treated with cipro  or similar for suspected prostatitis.   Encounter for screening -     Hepatitis B surface antibody,qualitative  Other long term (current) drug therapy -     CBC with Differential/Platelet  Left upper quadrant abdominal pain Assessment & Plan: Will check labs. Tenderness with palpation.   Orders: -     Lipase -     Amylase  Constipation, unspecified constipation type Assessment & Plan: Severe constipation with abdominal pain due to low water intake and high pasta diet. - Increase water intake to four to five bottles per day. - Increase intake of vegetables and fruits. - Order liver function tests and hemoglobin. - Consider abdominal X-ray if lab results are normal.   Encounter for prostate cancer screening -     PSA  Muscle tension pain Assessment & Plan: Neck tightness managed with methocarbamol  as needed.        Return for 6 month chol check, 1 year physical. Patient was given opportunity to ask questions. Patient verbalized understanding of the plan and was able to repeat key elements of the plan. All questions were answered to their satisfaction.   Scott Ada, FNP  I, Scott Ada, FNP, have reviewed all documentation for this visit. The documentation on 12/21/23 for the exam, diagnosis, procedures, and orders are all accurate and complete.

## 2023-12-15 ENCOUNTER — Other Ambulatory Visit: Payer: Self-pay | Admitting: Licensed Clinical Social Worker

## 2023-12-15 LAB — CBC WITH DIFFERENTIAL/PLATELET
Basophils Absolute: 0.1 x10E3/uL (ref 0.0–0.2)
Basos: 1 %
EOS (ABSOLUTE): 0.1 x10E3/uL (ref 0.0–0.4)
Eos: 2 %
Hematocrit: 43.9 % (ref 37.5–51.0)
Hemoglobin: 14.5 g/dL (ref 13.0–17.7)
Immature Grans (Abs): 0 x10E3/uL (ref 0.0–0.1)
Immature Granulocytes: 0 %
Lymphocytes Absolute: 2.8 x10E3/uL (ref 0.7–3.1)
Lymphs: 56 %
MCH: 29.1 pg (ref 26.6–33.0)
MCHC: 33 g/dL (ref 31.5–35.7)
MCV: 88 fL (ref 79–97)
Monocytes Absolute: 0.5 x10E3/uL (ref 0.1–0.9)
Monocytes: 9 %
Neutrophils Absolute: 1.7 x10E3/uL (ref 1.4–7.0)
Neutrophils: 32 %
Platelets: 177 x10E3/uL (ref 150–450)
RBC: 4.99 x10E6/uL (ref 4.14–5.80)
RDW: 14.2 % (ref 11.6–15.4)
WBC: 5.1 x10E3/uL (ref 3.4–10.8)

## 2023-12-15 LAB — CMP14+EGFR
ALT: 23 IU/L (ref 0–44)
AST: 24 IU/L (ref 0–40)
Albumin: 4.9 g/dL (ref 4.1–5.1)
Alkaline Phosphatase: 86 IU/L (ref 44–121)
BUN/Creatinine Ratio: 12 (ref 9–20)
BUN: 12 mg/dL (ref 6–24)
Bilirubin Total: 0.6 mg/dL (ref 0.0–1.2)
CO2: 17 mmol/L — ABNORMAL LOW (ref 20–29)
Calcium: 9.9 mg/dL (ref 8.7–10.2)
Chloride: 101 mmol/L (ref 96–106)
Creatinine, Ser: 1 mg/dL (ref 0.76–1.27)
Globulin, Total: 2.7 g/dL (ref 1.5–4.5)
Glucose: 95 mg/dL (ref 70–99)
Potassium: 4.2 mmol/L (ref 3.5–5.2)
Sodium: 139 mmol/L (ref 134–144)
Total Protein: 7.6 g/dL (ref 6.0–8.5)
eGFR: 96 mL/min/1.73 (ref >59)

## 2023-12-15 LAB — LIPID PANEL
Chol/HDL Ratio: 4.3 ratio (ref 0.0–5.0)
Cholesterol, Total: 124 mg/dL (ref 100–199)
HDL: 29 mg/dL — ABNORMAL LOW (ref >39)
LDL Chol Calc (NIH): 63 mg/dL (ref 0–99)
Triglycerides: 189 mg/dL — ABNORMAL HIGH (ref 0–149)
VLDL Cholesterol Cal: 32 mg/dL (ref 5–40)

## 2023-12-15 LAB — HEMOGLOBIN A1C
Est. average glucose Bld gHb Est-mCnc: 126 mg/dL
Hgb A1c MFr Bld: 6 % — ABNORMAL HIGH (ref 4.8–5.6)

## 2023-12-15 LAB — HEPATITIS B SURFACE ANTIBODY,QUALITATIVE: Hep B Surface Ab, Qual: NONREACTIVE

## 2023-12-15 LAB — LIPASE: Lipase: 17 U/L (ref 13–78)

## 2023-12-15 LAB — PSA: Prostate Specific Ag, Serum: 0.3 ng/mL (ref 0.0–4.0)

## 2023-12-15 LAB — AMYLASE: Amylase: 70 U/L (ref 31–110)

## 2023-12-21 ENCOUNTER — Ambulatory Visit: Payer: Self-pay | Admitting: Nurse Practitioner

## 2023-12-21 DIAGNOSIS — Z0000 Encounter for general adult medical examination without abnormal findings: Secondary | ICD-10-CM | POA: Insufficient documentation

## 2023-12-21 DIAGNOSIS — Z87438 Personal history of other diseases of male genital organs: Secondary | ICD-10-CM | POA: Insufficient documentation

## 2023-12-21 NOTE — Assessment & Plan Note (Signed)
Continue follow-up with behavioral health.  No recent changes per patient.

## 2023-12-21 NOTE — Assessment & Plan Note (Signed)
 Hemoglobin A1c is stable. Will check A1c

## 2023-12-21 NOTE — Assessment & Plan Note (Addendum)
 Cholesterol levels have improved.  Continue focusing on low-fat diet and taking statin.  Tolerating well. Received atorvastatin  pills of different sizes, advised to verify manufacturer change with pharmacy. - Contact mail order pharmacy to verify manufacturer change.

## 2023-12-21 NOTE — Assessment & Plan Note (Signed)
 Not exercising regularly, consumes fast food impacting health. - Encourage regular physical activity. - Advise reducing fast food intake and increasing consumption of a balanced diet with more vegetables and fruits. - vaccines are up to date

## 2023-12-21 NOTE — Assessment & Plan Note (Signed)
 Will check labs. Tenderness with palpation.

## 2023-12-21 NOTE — Assessment & Plan Note (Signed)
 Neck tightness managed with methocarbamol  as needed.

## 2023-12-21 NOTE — Assessment & Plan Note (Signed)
 Severe constipation with abdominal pain due to low water intake and high pasta diet. - Increase water intake to four to five bottles per day. - Increase intake of vegetables and fruits. - Order liver function tests and hemoglobin. - Consider abdominal X-ray if lab results are normal.

## 2023-12-21 NOTE — Assessment & Plan Note (Signed)
 Urinary frequency treated with cipro  or similar for suspected prostatitis.

## 2023-12-22 ENCOUNTER — Encounter (HOSPITAL_COMMUNITY): Payer: Self-pay | Admitting: Emergency Medicine

## 2023-12-22 ENCOUNTER — Ambulatory Visit (HOSPITAL_COMMUNITY)
Admission: EM | Admit: 2023-12-22 | Discharge: 2023-12-22 | Disposition: A | Discharge status: Home / Self Care | Payer: Self-pay | Attending: Family Medicine | Admitting: Family Medicine

## 2023-12-22 DIAGNOSIS — J069 Acute upper respiratory infection, unspecified: Secondary | ICD-10-CM | POA: Diagnosis not present

## 2023-12-22 DIAGNOSIS — R3129 Other microscopic hematuria: Secondary | ICD-10-CM | POA: Diagnosis not present

## 2023-12-22 DIAGNOSIS — R1031 Right lower quadrant pain: Secondary | ICD-10-CM

## 2023-12-22 DIAGNOSIS — Z113 Encounter for screening for infections with a predominantly sexual mode of transmission: Secondary | ICD-10-CM | POA: Insufficient documentation

## 2023-12-22 DIAGNOSIS — R350 Frequency of micturition: Secondary | ICD-10-CM

## 2023-12-22 LAB — POCT URINALYSIS DIP (MANUAL ENTRY)
Bilirubin, UA: NEGATIVE
Glucose, UA: NEGATIVE mg/dL
Ketones, POC UA: NEGATIVE mg/dL
Leukocytes, UA: NEGATIVE
Nitrite, UA: NEGATIVE
Protein Ur, POC: NEGATIVE mg/dL
Spec Grav, UA: 1.01 (ref 1.010–1.025)
Urobilinogen, UA: 0.2 U/dL
pH, UA: 5.5 (ref 5.0–8.0)

## 2023-12-22 LAB — POCT FASTING CBG KUC MANUAL ENTRY: POCT Glucose (KUC): 98 mg/dL (ref 70–99)

## 2023-12-22 LAB — POC COVID19/FLU A&B COMBO
Covid Antigen, POC: NEGATIVE
Influenza A Antigen, POC: NEGATIVE
Influenza B Antigen, POC: NEGATIVE

## 2023-12-22 NOTE — ED Triage Notes (Signed)
 Pt noticed right groin swelling today that is painful.   Pt also reports having itchy throat, coughing, headache that started last night. Took ibuprofen .

## 2023-12-23 LAB — CYTOLOGY, (ORAL, ANAL, URETHRAL) ANCILLARY ONLY
Chlamydia: NEGATIVE
Comment: NEGATIVE
Comment: NEGATIVE
Comment: NORMAL
Neisseria Gonorrhea: NEGATIVE
Trichomonas: NEGATIVE

## 2023-12-23 LAB — URINE CULTURE: Culture: NO GROWTH

## 2023-12-24 ENCOUNTER — Ambulatory Visit (HOSPITAL_COMMUNITY): Payer: Self-pay

## 2023-12-24 NOTE — Patient Instructions (Signed)
 Visit Information  Thank you for taking time to visit with me today. Please don't hesitate to contact me if I can be of assistance to you before our next scheduled appointment.  Your next care management appointment is by telephone on 08/6 at 1 PM   Please call the care guide team at [phone] if you need to cancel, schedule, or reschedule an appointment.   Please call the Suicide and Crisis Lifeline: 988 go to [facility] Urgent [facility] [address], [place] [phone]) call 911 if you are experiencing a Mental Health or Behavioral Health Crisis or need someone to talk to.  Rolin Kerns, LCSW [place]  [facility], [facility] Clinical Social Worker Direct Dial: [phone]  Fax: [phone] Website: [email or web address] 11:54 AM

## 2023-12-24 NOTE — Patient Outreach (Signed)
 Complex Care Management   Visit Note  12/15/2023  Name:  Scott Farrell MRN: [id number] DOB: 1981-05-10  Situation: Referral received for Complex Care Management related to Mental/Behavioral Health diagnosis Schizophrenia I obtained verbal consent from Patient.  Visit completed with pt  on the phone  Background:   Past Medical History:  Diagnosis Date   Clotting disorder (HCC)    personal hx/o? etiology unclear   Costochondritis    history of   Diverticulitis    Herpes genitalia 06/2010   History of echocardiogram 01/07/2011   normal LV function, EF 60%, trace mitral, tricuspid and pulmonic regurgitation; Dr. Jacques Somerset   IBS (irritable bowel syndrome)    Mental disorder 2013   hospitalized for psychiatric illness prior   Normal cardiac stress test 01/07/2011   negative treadmill test, no evidence of ischemia, good exercise capacity; Jacques Somerset, MD;    Pre-diabetes    Prostatitis 11/2010   Dr. Ottelin   Renal stone 06/2012   Urology consult   Schizophrenia [facility])    [facility], [facility] - Dr. Zell Harbour   Tobacco use disorder     Assessment: Patient Reported Symptoms:  Cognitive Cognitive Status: Alert and oriented to person, place, and time, Normal speech and language skills Cognitive/Intellectual Conditions Management [RPT]: None reported or documented in medical history or problem list      Neurological Neurological Review of Symptoms: Not assessed    HEENT        Cardiovascular Cardiovascular Symptoms Reported: Not assessed    Respiratory Respiratory Symptoms Reported: Not assesed    Endocrine Endocrine Symptoms Reported: Not assessed    Gastrointestinal Gastrointestinal Symptoms Reported: Not assessed      Genitourinary Genitourinary Symptoms Reported: Not assessed    Integumentary Integumentary Symptoms Reported: Not assessed    Musculoskeletal Musculoskelatal Symptoms Reviewed: Not assessed        Psychosocial Psychosocial  Symptoms Reported: Alteration in sleep habits, Other Other Psychosocial Conditions: Stress Additional Psychological Details: Patient reports difficulty obtaining quality sleep and strain between family. Strategies discussed to assist with symptom manaagement and promote positive mood Behavioral Management Strategies: Adequate rest, Coping strategies, Support system Major Change/Loss/Stressor/Fears (CP): Medical condition, self        12/14/2023   11:58 AM  Depression screen PHQ 2/9  Decreased Interest 1  Down, Depressed, Hopeless 1  PHQ - 2 Score 2  Altered sleeping 1  Tired, decreased energy 1  Change in appetite 0  Feeling bad or failure about yourself  1  Trouble concentrating 0  Moving slowly or fidgety/restless 0  Suicidal thoughts 0  PHQ-9 Score 5  Difficult doing work/chores Somewhat difficult    There were no vitals filed for this visit.  Medications Reviewed Today     Reviewed by Ezzard Rolin BIRCH, LCSW (Social Worker) on 12/24/23 at 1146  Med List Status: <None>   Medication Order Taking? Sig Documenting Provider Last Dose Status Informant  atorvastatin  (LIPITOR) 20 MG tablet [id number]  Take 1 tablet (20 mg total) by mouth daily. Georgina Speaks, FNP  Active   divalproex  (DEPAKOTE ) 500 MG DR tablet [id number]  Take 500 mg by mouth at bedtime. [provider]  Active Self  IBUPROFEN  PO [id number]  Take 1 tablet by mouth as needed. [provider]  Active Self  methocarbamol  (ROBAXIN -750) 750 MG tablet [id number]  Take 1 tablet (750 mg total) by mouth every 8 (eight) hours as needed for muscle spasms. Georgina Speaks, FNP  Active   nicotine  (NICODERM CQ  -  DOSED IN MG/24 HOURS) 14 mg/24hr patch [id number]  Place 1 patch (14 mg total) onto the skin daily.  Patient not taking: Reported on 12/14/2023   Georgina Speaks, FNP  Active            Med Note ANGELICA, Dustee Bottenfield D   Tue Sep 07, 2023 12:58 PM) Pt states he needs refill   risperidone (RISPERDAL) 4 MG tablet  [id number]  Take 4 mg by mouth 2 (two) times daily. [provider]  Active Self            Recommendation:   Continue Current Plan of Care  Follow Up Plan:   Telephone follow-up in 1 month  Rolin Kerns, LCSW [facility]  [facility], [facility] Clinical Social Worker Direct Dial: [phone]  Fax: [phone] Website: [email or web address] 11:54 AM

## 2023-12-25 NOTE — ED Provider Notes (Signed)
 [facility]ARE [facility]ENTER   [id number] 12/22/23 Arrival Time: 1336  ASSESSMENT & PLAN:  1. Groin pain, right   2. Viral URI with cough   3. Urinary frequency   4. Other microscopic hematuria     Follow-up Information     Schedule an appointment as soon as possible for a visit  with Georgina Speaks, FNP.   Specialty: General Practice Why: To discuss getting an ultrasound to rule out what we call an inguinal hernia. Contact information: [address] [place] [address] [phone]         [facility] Emergency Department at [facility].   Specialty: Emergency Medicine Why: If symptoms worsen in any way. Contact information: [address] [place]  [phone]              Can also recheck U/A with PCP. Benign groin exam; slight bulging that may suggest inguinal hernia. Discussed.  Discussed typical duration of likely viral illness. Results for orders placed or performed during the hospital encounter of 12/22/23  POC Covid19/Flu A&B Antigen   Collection Time: 12/22/23  2:33 PM  Result Value Ref Range   Influenza A Antigen, POC Negative Negative   Influenza B Antigen, POC Negative Negative   Covid Antigen, POC Negative Negative  POC urinalysis dipstick   Collection Time: 12/22/23  3:03 PM  Result Value Ref Range   Color, UA yellow yellow   Clarity, UA clear clear   Glucose, UA negative negative mg/dL   Bilirubin, UA negative negative   Ketones, POC UA negative negative mg/dL   Spec Grav, UA 8.989 8.989 - 1.025   Blood, UA trace-lysed (A) negative   pH, UA 5.5 5.0 - 8.0   Protein Ur, POC negative negative mg/dL   Urobilinogen, UA 0.2 0.2 or 1.0 E.U./dL   Nitrite, UA Negative Negative   Leukocytes, UA Negative Negative  Cytology Ancillary Only -Urethral; GC / Chlamydia, Trichomonas   Collection Time: 12/22/23  3:03 PM  Result Value Ref Range   Neisseria Gonorrhea Negative    Chlamydia Negative     Trichomonas Negative    Comment Normal Reference Range Trichomonas - Negative    Comment Normal Reference Ranger Chlamydia - Negative    Comment      Normal Reference Range Neisseria Gonorrhea - Negative  POC CBG monitoring   Collection Time: 12/22/23  3:09 PM  Result Value Ref Range   POCT Glucose ([facility]) 98 70 - 99 mg/dL  Urine Culture   Collection Time: 12/22/23  3:24 PM   Specimen: Urine, Clean Catch  Result Value Ref Range   Specimen Description URINE, CLEAN CATCH    Special Requests NONE    Culture      NO GROWTH Performed at [facility], [address], [place], [place] [address]    Report Status 12/23/2023 FINAL     Follow-up Information     Schedule an appointment as soon as possible for a visit  with Georgina Speaks, FNP.   Specialty: General Practice Why: To discuss getting an ultrasound to rule out what we call an inguinal hernia. Contact information: [address] [place] [address] [phone]         [facility] Emergency Department at [facility].   Specialty: Emergency Medicine Why: If symptoms worsen in any way. Contact information: [address] [place]  [phone]  Reviewed expectations re: course of current medical issues. Questions answered. Outlined signs and symptoms indicating need for more acute intervention. Understanding verbalized. After Visit Summary given.   SUBJECTIVE: History from: Patient. Scott Farrell is a 43 y.o. male. Pt noticed right groin swelling today that is painful. Has eased off somewhat now. Denies dysuria/urinary freq/hematuria. Denies groin trauma. Denies specific scrotal pain. Pt also reports having itchy throat, coughing, headache that started last night. Took ibuprofen .  Denies: fever and difficulty breathing. Normal PO intake without n/v/d.  OBJECTIVE:  Vitals:   12/22/23 1353  BP: 107/66  Pulse: 70  Resp: 17  Temp:  98.1 F (36.7 C)  TempSrc: Oral  SpO2: 93%    General appearance: alert; no distress Eyes: PERRLA; EOMI; conjunctiva normal HENT: [place]; AT; with mild nasal congestion Neck: supple  Lungs: speaks full sentences without difficulty; unlabored; CTAB; dry cough Extremities: no edema GU: normal appearing genitalia; slight bulging just above and to R of penis; non-tender; skin appears normal; without scrotal/testicular swelling/masses Skin: warm and dry Neurologic: normal gait Psychological: alert and cooperative; normal mood and affect   Allergies  Allergen Reactions   Metformin  And Related Other (See Comments)    Chest tightness    Past Medical History:  Diagnosis Date   Clotting disorder (HCC)    personal hx/o? etiology unclear   Costochondritis    history of   Diverticulitis    Herpes genitalia 06/2010   History of echocardiogram 01/07/2011   normal LV function, EF 60%, trace mitral, tricuspid and pulmonic regurgitation; Dr. Jacques Somerset   IBS (irritable bowel syndrome)    Mental disorder 2013   hospitalized for psychiatric illness prior   Normal cardiac stress test 01/07/2011   negative treadmill test, no evidence of ischemia, good exercise capacity; Jacques Somerset, MD;    Pre-diabetes    Prostatitis 11/2010   Dr. Ottelin   Renal stone 06/2012   Urology consult   Schizophrenia [facility])    [facility], [facility] - Dr. Zell Harbour   Tobacco use disorder    Social History   Socioeconomic History   Marital status: Single    Spouse name: Not on file   Number of children: 1   Years of education: Not on file   Highest education level: Not on file  Occupational History   Occupation: [employer]  Tobacco Use   Smoking status: Every Day    Current packs/day: 0.25    Average packs/day: 0.3 packs/day for 23.0 years (5.8 ttl pk-yrs)    Types: Cigars, Cigarettes    Passive exposure: Never   Smokeless tobacco: Never   Tobacco comments:    07/10/2021, smokes 4 [place]  cigars daily, he is using a nicotine  patch  Vaping Use   Vaping status: Former   Substances: Nicotine   Substance and Sexual Activity   Alcohol use: Yes    Comment: occ   Drug use: Not Currently   Sexual activity: Not Currently    Partners: Female  Other Topics Concern   Not on file  Social History Narrative   Lives with mother, Morgan, exercising with walking, running, no current relationship; unemployed   Caffeine  use: 1 cup per day   Right handed   Social Drivers of Health   Financial Resource Strain: Low Risk  (08/11/2023)   Overall Financial Resource Strain (CARDIA)    Difficulty of Paying Living Expenses: Not hard at all  Food Insecurity: No Food Insecurity (09/07/2023)   Hunger Vital Sign  Worried About Programme researcher, broadcasting/film/video in the Last Year: Never true    Ran Out of Food in the Last Year: Never true  Transportation Needs: No Transportation Needs (08/11/2023)   PRAPARE - Administrator, Civil Service (Medical): No    Lack of Transportation (Non-Medical): No  Physical Activity: Inactive (08/11/2023)   Exercise Vital Sign    Days of Exercise per Week: 0 days    Minutes of Exercise per Session: 0 min  Stress: No Stress Concern Present (08/11/2023)   [employer] of Occupational Health - Occupational Stress Questionnaire    Feeling of Stress : Not at all  Social Connections: Socially Isolated (08/11/2023)   Social Connection and Isolation Panel    Frequency of Communication with Friends and Family: More than three times a week    Frequency of Social Gatherings with Friends and Family: Never    Attends Religious Services: Never    Database administrator or Organizations: No    Attends Banker Meetings: Never    Marital Status: Never married  Intimate Partner Violence: Not At Risk (09/07/2023)   Humiliation, Afraid, Rape, and Kick questionnaire    Fear of Current or Ex-Partner: No    Emotionally Abused: No    Physically Abused: No    Sexually  Abused: No   Family History  Problem Relation Age of Onset   Hypertension Mother    Stroke Mother    Depression Mother    Acne Mother    Thyroid  disease Mother    Diabetes Mother    Colon cancer Mother 45   Acute lymphoblastic leukemia Father    Diabetes Father    Cancer Brother    Breast cancer Maternal Aunt    Ovarian cancer Maternal Aunt    Esophageal cancer Neg Hx    Stomach cancer Neg Hx    Past Surgical History:  Procedure Laterality Date   EUS  06/30/2012   Procedure: UPPER ENDOSCOPIC ULTRASOUND (EUS) LINEAR;  Surgeon: Toribio SHAUNNA Cedar, MD;  Location: [facility];  Service: Endoscopy;  Laterality: N/A;  radial linear   EXCISION MASS HEAD Right 03/12/2023   Procedure: EXCISION OF FOREHEAD TUMOR (LIPOMA);  Surgeon: Luciano Standing, MD;  Location: [facility] OR;  Service: ENT;  Laterality: Right;   WISDOM TOOTH EXTRACTION       Rolinda Rogue, MD 12/25/23 1425

## 2023-12-30 ENCOUNTER — Ambulatory Visit: Admitting: Nurse Practitioner

## 2023-12-30 ENCOUNTER — Encounter: Payer: Self-pay | Admitting: Family Medicine

## 2023-12-30 ENCOUNTER — Ambulatory Visit (INDEPENDENT_AMBULATORY_CARE_PROVIDER_SITE_OTHER): Admitting: Family Medicine

## 2023-12-30 VITALS — BP 110/70 | HR 69 | Temp 99.1°F | Ht 77.0 in | Wt 248.0 lb

## 2023-12-30 DIAGNOSIS — F2089 Other schizophrenia: Secondary | ICD-10-CM | POA: Diagnosis not present

## 2023-12-30 DIAGNOSIS — J069 Acute upper respiratory infection, unspecified: Secondary | ICD-10-CM | POA: Diagnosis not present

## 2023-12-30 DIAGNOSIS — R1031 Right lower quadrant pain: Secondary | ICD-10-CM | POA: Diagnosis not present

## 2023-12-30 DIAGNOSIS — R1032 Left lower quadrant pain: Secondary | ICD-10-CM

## 2023-12-30 DIAGNOSIS — N644 Mastodynia: Secondary | ICD-10-CM | POA: Diagnosis not present

## 2023-12-30 MED ORDER — AZITHROMYCIN 250 MG PO TABS: ORAL_TABLET | ORAL | 0 refills | Status: DC

## 2023-12-30 MED ORDER — BENZONATATE 100 MG PO CAPS: 100.0000 mg | ORAL_CAPSULE | Freq: Three times a day (TID) | ORAL | 0 refills | Status: DC | PRN

## 2023-12-30 MED ORDER — TRIAMCINOLONE ACETONIDE 40 MG/ML IJ SUSP
60.0000 mg | Freq: Once | INTRAMUSCULAR | Status: AC
  Administered 2023-12-30: 60 mg via INTRAMUSCULAR

## 2023-12-30 NOTE — Progress Notes (Signed)
 I,Scott Farrell, CMA,acting as a Neurosurgeon for [name], NP.,have documented all relevant documentation on the behalf of Scott Creighton, NP,as directed by  Scott Creighton, NP while in the presence of Scott Creighton, NP.  Subjective:  Patient ID: Scott Farrell , male    DOB: 11-22-1980 , 43 y.o.   MRN: [id number]  Chief Complaint  Patient presents with   URI    Patient presents today for cold symptoms. He would like some medicine he is still coughing and spitting up a lot of green mucus.   Mass    Patient reports he also has a lump in his chest. He reports it is a little bit painful. Patient denies any changes in the lump.    Groin Pain    Patient reports he is still having groin pain. He reports he did go to urgent care and they told him to come see us  because he may have a possible hernia.     HPI Discussed the use of AI scribe software for clinical note transcription with the patient, who gave verbal consent to proceed.  History of Present Illness     Scott Farrell is a 43 year old male who presents with right groin pain and a lump on the right nipple.  He has been experiencing right groin pain for approximately a week and a half. Initially, he sought care at an urgent care facility where he was told he might have a hernia, but no ultrasound was performed. The pain sometimes reaches a level of four out of ten on the pain scale, with occasional increases in severity. He also notes swelling on the left side of the groin, described as feeling like a 'little ball'.  He has noticed a pain on his right nipple for about a week, which is sore to the touch but not draining. He recalls a past incident several years ago when his nipple was draining pus, which he attributes to a possible infection. He is unsure about any family history of breast cancer but mentions his mother had some type of cancer and his brother died of sarcoma cancer.  He reports a cough with clear and greenish mucus,  nasal congestion, and body aches for the past week. He has experienced a slight fever but did not measure his temperature. He describes feeling chills and sweating. He has been taking ibuprofen  but feels it is not addressing his symptoms. He also reports wheezing, which he associates with taking metformin .  He smokes about ten cigarettes a day and is considering using nicotine  patches once he is financially stable.  He is currently taking Depakote  for schizophrenia and continues to attend a mental health clinic. He recalls having a urinary tract infection a month or two ago, for which he was prescribed either amoxicillin  or ciprofloxacin .      Past Medical History:  Diagnosis Date   Clotting disorder [facility])    personal hx/o? etiology unclear   Costochondritis    history of   Diverticulitis    Herpes genitalia 06/2010   History of echocardiogram 01/07/2011   normal LV function, EF 60%, trace mitral, tricuspid and pulmonic regurgitation; Dr. Jacques Somerset   IBS (irritable bowel syndrome)    Mental disorder 2013   hospitalized for psychiatric illness prior   Normal cardiac stress test 01/07/2011   negative treadmill test, no evidence of ischemia, good exercise capacity; Jacques Somerset, MD;    Pre-diabetes    Prostatitis 11/2010   Dr. Ottelin  Renal stone 06/2012   Urology consult   Schizophrenia [facility])    [facility], [facility] - Dr. Zell Harbour   Tobacco use disorder      Family History  Problem Relation Age of Onset   Hypertension Mother    Stroke Mother    Depression Mother    Acne Mother    Thyroid  disease Mother    Diabetes Mother    Colon cancer Mother 73   Acute lymphoblastic leukemia Father    Diabetes Father    Cancer Brother    Breast cancer Maternal Aunt    Ovarian cancer Maternal Aunt    Esophageal cancer Neg Hx    Stomach cancer Neg Hx      Current Outpatient Medications:    atorvastatin  (LIPITOR) 20 MG tablet, Take 1 tablet (20 mg total) by mouth  daily., Disp: 90 tablet, Rfl: 1   azithromycin  (ZITHROMAX ) 250 MG tablet, Take 2 tablets today, then 1 tablet daily afterwards, Disp: 6 each, Rfl: 0   benzonatate  (TESSALON  PERLES) 100 MG capsule, Take 1 capsule (100 mg total) by mouth 3 (three) times daily as needed., Disp: 90 capsule, Rfl: 0   divalproex  (DEPAKOTE ) 500 MG DR tablet, Take 500 mg by mouth at bedtime., Disp: , Rfl:    IBUPROFEN  PO, Take 1 tablet by mouth as needed., Disp: , Rfl:    methocarbamol  (ROBAXIN -750) 750 MG tablet, Take 1 tablet (750 mg total) by mouth every 8 (eight) hours as needed for muscle spasms., Disp: 30 tablet, Rfl: 0   nicotine  (NICODERM CQ  - DOSED IN MG/24 HOURS) 14 mg/24hr patch, Place 1 patch (14 mg total) onto the skin daily., Disp: 90 patch, Rfl: 0   risperidone (RISPERDAL) 4 MG tablet, Take 4 mg by mouth 2 (two) times daily., Disp: , Rfl:    Allergies  Allergen Reactions   Metformin  And Related Other (See Comments)    Chest tightness     Review of Systems  Constitutional: Negative.   Respiratory:  Positive for cough. Negative for shortness of breath and wheezing.   Cardiovascular: Negative.   Musculoskeletal: Negative.   Skin: Negative.   Neurological: Negative.  Negative for headaches.  Psychiatric/Behavioral:  Positive for behavioral problems.      Today's Vitals   12/30/23 1409  BP: 110/70  Pulse: 69  Temp: 99.1 F (37.3 C)  TempSrc: Oral  Weight: 248 lb (112.5 kg)  Height: 6' 5 (1.956 m)   Body mass index is 29.41 kg/m.  Wt Readings from Last 3 Encounters:  12/30/23 248 lb (112.5 kg)  12/14/23 249 lb (112.9 kg)  08/11/23 248 lb 12.8 oz (112.9 kg)    The ASCVD Risk score (Arnett DK, et al., 2019) failed to calculate for the following reasons:   The valid total cholesterol range is 130 to 320 mg/dL  Objective:  Physical Exam HENT:     Head: Normocephalic.     Nose: Rhinorrhea present.  Cardiovascular:     Rate and Rhythm: Normal rate and regular rhythm.  Pulmonary:      Breath sounds: Wheezing present.  Chest:  Breasts:    Right: Normal. No swelling or inverted nipple.     Left: Normal. No swelling or inverted nipple.     Comments: No swelling or discharge noted to breasts Neurological:     Mental Status: He is alert. Mental status is at baseline.         Assessment And Plan:  URI with cough and congestion -  Azithromycin ; Take 2 tablets today, then 1 tablet daily afterwards  Dispense: 6 each; Refill: 0 -     Benzonatate ; Take 1 capsule (100 mg total) by mouth 3 (three) times daily as needed.  Dispense: 90 capsule; Refill: 0 -     Triamcinolone  Acetonide  Inguinal pain of both sides Assessment & Plan:  Order ultrasound of the groin to confirm hernia diagnosis. - Advise against heavy lifting. - Refer to general surgery if hernia is confirmed and becomes bothersome.  Orders: -     CT ABDOMEN PELVIS WO CONTRAST; Future  Other schizophrenia (HCC) Assessment & Plan: Schizophrenia, stable on medication Condition stable on Depakote  with regular mental health clinic attendance   Soreness breast Assessment & Plan: Soreneness right nipple without significant lump or drainage. No family history of breast cancer. - Advise to monitor for any changes or drainage. - Educated on self-examination for lumps.   Assessment and Plan Assessment & Plan Right groin pain and swelling, possible hernia Pain and swelling in right groin suggest possible hernia. Differential includes hernia, which may require surgery if symptoms worsen. -   Acute cough with congestion and wheezing Cough with nasal discharge, fever, body aches, and wheezing. Smoking may exacerbate symptoms. - Prescribe antibiotics with dosing of two tablets today, then one tablet daily for the next four days. - Prescribe Tessalon  for cough. - Administer steroid injection for wheezing. - Advise on smoking cessation to improve respiratory symptoms.  .  Tobacco use disorder Currently  smoking ten cigarettes daily. Interested in quitting once financially stable. Discussed cost-effectiveness of cessation aids. - Encourage gradual reduction in cigarette use. - Discuss benefits of smoking cessation and use of nicotine  patches.    Return if symptoms worsen or fail to improve, for keep next appt.  Patient was given opportunity to ask questions. Patient verbalized understanding of the plan and was able to repeat key elements of the plan. All questions were answered to their satisfaction.    I, Scott Creighton, NP, have reviewed all documentation for this visit. The documentation on 01/03/2024 for the exam, diagnosis, procedures, and orders are all accurate and complete.    IF YOU HAVE BEEN REFERRED TO A SPECIALIST, IT MAY TAKE 1-2 WEEKS TO SCHEDULE/PROCESS THE REFERRAL. IF YOU HAVE NOT HEARD FROM US /SPECIALIST IN TWO WEEKS, PLEASE GIVE US  A CALL AT [phone].

## 2024-01-04 ENCOUNTER — Encounter: Payer: Self-pay | Admitting: Family Medicine

## 2024-01-04 DIAGNOSIS — R1031 Right lower quadrant pain: Secondary | ICD-10-CM | POA: Insufficient documentation

## 2024-01-04 DIAGNOSIS — N644 Mastodynia: Secondary | ICD-10-CM | POA: Insufficient documentation

## 2024-01-04 DIAGNOSIS — J069 Acute upper respiratory infection, unspecified: Secondary | ICD-10-CM | POA: Insufficient documentation

## 2024-01-04 NOTE — Assessment & Plan Note (Signed)
 Soreneness right nipple without significant lump or drainage. No family history of breast cancer. - Advise to monitor for any changes or drainage. - Educated on self-examination for lumps.

## 2024-01-04 NOTE — Assessment & Plan Note (Signed)
 Schizophrenia, stable on medication Condition stable on Depakote  with regular mental health clinic attendance

## 2024-01-04 NOTE — Assessment & Plan Note (Signed)
 Order ultrasound of the groin to confirm hernia diagnosis. - Advise against heavy lifting. - Refer to general surgery if hernia is confirmed and becomes bothersome.

## 2024-01-05 ENCOUNTER — Other Ambulatory Visit: Payer: Self-pay | Admitting: Licensed Clinical Social Worker

## 2024-01-10 ENCOUNTER — Other Ambulatory Visit

## 2024-01-10 NOTE — Patient Outreach (Signed)
 Complex Care Management   Visit Note  01/05/2024  Name:  Scott Farrell MRN: [id number] DOB: Jan 29, 1981  Situation: Referral received for Complex Care Management related to Mental/Behavioral Health diagnosis Schizophrenia I obtained verbal consent from Patient.  Visit completed with pt  on the phone  Background:   Past Medical History:  Diagnosis Date   Clotting disorder (HCC)    personal hx/o? etiology unclear   Costochondritis    history of   Diverticulitis    Herpes genitalia 06/2010   History of echocardiogram 01/07/2011   normal LV function, EF 60%, trace mitral, tricuspid and pulmonic regurgitation; Dr. Jacques Somerset   IBS (irritable bowel syndrome)    Mental disorder 2013   hospitalized for psychiatric illness prior   Normal cardiac stress test 01/07/2011   negative treadmill test, no evidence of ischemia, good exercise capacity; Jacques Somerset, MD;    Pre-diabetes    Prostatitis 11/2010   Dr. Ottelin   Renal stone 06/2012   Urology consult   Schizophrenia [facility])    [facility], [facility] - Dr. Zell Harbour   Tobacco use disorder     Assessment: Patient Reported Symptoms:  Cognitive Cognitive Status: Alert and oriented to person, place, and time, Normal speech and language skills   Health Maintenance Behaviors: Annual physical exam, Sleep adequate  Neurological Neurological Review of Symptoms: Not assessed    HEENT HEENT Symptoms Reported: Not assessed      Cardiovascular Cardiovascular Symptoms Reported: Not assessed    Respiratory Respiratory Symptoms Reported: Not assesed    Endocrine Endocrine Symptoms Reported: Not assessed    Gastrointestinal Gastrointestinal Symptoms Reported: Not assessed      Genitourinary Genitourinary Symptoms Reported: Not assessed    Integumentary Integumentary Symptoms Reported: Not assessed    Musculoskeletal Musculoskelatal Symptoms Reviewed: Not assessed        Psychosocial Psychosocial Symptoms Reported:  Substance use Other Psychosocial Conditions: Increase in alcohol use Behavioral Management Strategies: Coping strategies, Medication therapy Major Change/Loss/Stressor/Fears (CP): Medical condition, self Techniques to Cope with Loss/Stress/Change: Diversional activities, Medication        12/14/2023   11:58 AM  Depression screen PHQ 2/9  Decreased Interest 1  Down, Depressed, Hopeless 1  PHQ - 2 Score 2  Altered sleeping 1  Tired, decreased energy 1  Change in appetite 0  Feeling bad or failure about yourself  1  Trouble concentrating 0  Moving slowly or fidgety/restless 0  Suicidal thoughts 0  PHQ-9 Score 5  Difficult doing work/chores Somewhat difficult    There were no vitals filed for this visit.  Medications Reviewed Today     Reviewed by Jimmye Wisnieski D, LCSW (Social Worker) on 01/10/24 at [phone]  Med List Status: <None>   Medication Order Taking? Sig Documenting Provider Last Dose Status Informant  atorvastatin  (LIPITOR) 20 MG tablet [id number]  Take 1 tablet (20 mg total) by mouth daily. Georgina Speaks, FNP  Active   azithromycin  (ZITHROMAX ) 250 MG tablet [id number]  Take 2 tablets today, then 1 tablet daily afterwards Petrina Pries, NP  Active   benzonatate  (TESSALON  PERLES) 100 MG capsule [id number]  Take 1 capsule (100 mg total) by mouth 3 (three) times daily as needed. Petrina Pries, NP  Active   divalproex  (DEPAKOTE ) 500 MG DR tablet [id number]  Take 500 mg by mouth at bedtime. [provider]  Active Self  IBUPROFEN  PO [id number]  Take 1 tablet by mouth as needed. [provider]  Active Self  methocarbamol  (ROBAXIN -750) 750 MG tablet  [id number]  Take 1 tablet (750 mg total) by mouth every 8 (eight) hours as needed for muscle spasms. Georgina Speaks, FNP  Active   nicotine  (NICODERM CQ  - DOSED IN MG/24 HOURS) 14 mg/24hr patch [id number]  Place 1 patch (14 mg total) onto the skin daily. Georgina Speaks, FNP  Active            Med Note ANGELICA, Nicole Defino D   Tue Sep 07, 2023 12:58 PM) Pt states he needs refill   risperidone (RISPERDAL) 4 MG tablet [id number]  Take 4 mg by mouth 2 (two) times daily. [provider]  Active Self            Recommendation:   Continue Current Plan of Care  Follow Up Plan:   Telephone follow-up in 1 month  Rolin Kerns, LCSW [facility]  [facility], [facility] Clinical Social Worker Direct Dial: [phone]  Fax: [phone] Website: [email or web address] 6:48 AM

## 2024-01-10 NOTE — Patient Instructions (Signed)
 Visit Information  Thank you for taking time to visit with me today. Please don't hesitate to contact me if I can be of assistance to you before our next scheduled appointment.  Your next care management appointment is by telephone on 09/04 at 1 PM  Please call the care guide team at [phone] if you need to cancel, schedule, or reschedule an appointment.   Please call the Suicide and Crisis Lifeline: 988 go to [facility] Urgent [facility] [address] [place] [phone]) call 911 if you are experiencing a Mental Health or Behavioral Health Crisis or need someone to talk to.  Rolin Kerns, LCSW   [facility], [facility] Clinical Social Worker Direct Dial: [phone]  Fax: [phone] Website: [email or web address] 6:48 AM

## 2024-01-16 ENCOUNTER — Other Ambulatory Visit: Payer: Self-pay | Admitting: Nurse Practitioner

## 2024-01-16 DIAGNOSIS — E782 Mixed hyperlipidemia: Secondary | ICD-10-CM

## 2024-01-20 ENCOUNTER — Inpatient Hospital Stay
Admission: RE | Admit: 2024-01-20 | Discharge: 2024-01-20 | Discharge status: Home / Self Care | Source: Ambulatory Visit | Attending: Family Medicine | Admitting: Family Medicine

## 2024-01-20 DIAGNOSIS — K5730 Diverticulosis of large intestine without perforation or abscess without bleeding: Secondary | ICD-10-CM | POA: Diagnosis not present

## 2024-01-20 DIAGNOSIS — N200 Calculus of kidney: Secondary | ICD-10-CM | POA: Diagnosis not present

## 2024-01-20 DIAGNOSIS — R1031 Right lower quadrant pain: Secondary | ICD-10-CM

## 2024-01-24 ENCOUNTER — Ambulatory Visit (INDEPENDENT_AMBULATORY_CARE_PROVIDER_SITE_OTHER): Admitting: Nurse Practitioner

## 2024-01-24 ENCOUNTER — Ambulatory Visit: Payer: Self-pay

## 2024-01-24 ENCOUNTER — Encounter: Payer: Self-pay | Admitting: Nurse Practitioner

## 2024-01-24 VITALS — BP 120/80 | HR 62 | Temp 97.8°F | Ht 77.0 in | Wt 237.0 lb

## 2024-01-24 DIAGNOSIS — Z720 Tobacco use: Secondary | ICD-10-CM

## 2024-01-24 DIAGNOSIS — R300 Dysuria: Secondary | ICD-10-CM

## 2024-01-24 DIAGNOSIS — M791 Myalgia, unspecified site: Secondary | ICD-10-CM

## 2024-01-24 LAB — POCT URINALYSIS DIP (CLINITEK)
Bilirubin, UA: NEGATIVE
Glucose, UA: NEGATIVE mg/dL
Ketones, POC UA: NEGATIVE mg/dL
Leukocytes, UA: NEGATIVE
Nitrite, UA: NEGATIVE
Spec Grav, UA: 1.025 (ref 1.010–1.025)
Urobilinogen, UA: 0.2 U/dL
pH, UA: 5.5 (ref 5.0–8.0)

## 2024-01-24 MED ORDER — METHOCARBAMOL 750 MG PO TABS: 750.0000 mg | ORAL_TABLET | Freq: Three times a day (TID) | ORAL | 0 refills | Status: DC | PRN

## 2024-01-24 MED ORDER — NICOTINE 14 MG/24HR TD PT24: 14.0000 mg | MEDICATED_PATCH | TRANSDERMAL | 1 refills | Status: AC

## 2024-01-24 NOTE — Telephone Encounter (Signed)
 FYI Only or Action Required?: FYI only for provider.  Patient was last seen in primary care on 12/30/2023 by Petrina Pries, NP.  Called Nurse Triage reporting Dysuria.  Symptoms began patient is unsure of when symptoms started.  Interventions attempted: Rest, hydration, or home remedies.  Symptoms are: unchanged.  Triage Disposition: See HCP Within 4 Hours (Or PCP Triage)  Patient/caregiver understands and will follow disposition?: Yes  Copied from CRM [id number]. Topic: Clinical - Red Word Triage >> Jan 24, 2024  2:31 PM [place] MATSU wrote: Red Word that prompted transfer to Nurse Triage: pain and pressure comes and goes; patient states he has kidney stone urine fliow is inconstant and can't hold urine at all. Reason for Disposition  [1] SEVERE pain with urination (e.g., excruciating) AND [2] not improved after 2 hours of pain medicine (e.g., acetaminophen  or ibuprofen )  Answer Assessment - Initial Assessment Questions 1. SEVERITY: How bad is the pain?  (e.g., Scale 1-10; mild, moderate, or severe)     2 out of 10 as he is sitting. Patient states pain level when trying to move is severe.  2. FREQUENCY: How many times have you had painful urination today?      1 3. PATTERN: Is pain present every time you urinate or just sometimes?      no 4. ONSET: When did the painful urination start?      Patient is unsure.  5. FEVER: Do you have a fever? If Yes, ask: What is your temperature, how was it measured, and when did it start?     no 6. PAST UTI: Have you had a urine infection before? If Yes, ask: When was the last time? and What happened that time?      no 7. CAUSE: What do you think is causing the painful urination?      Patient had a CT scan done on 8/21 that shows 2 mm kidney stone 8. OTHER SYMPTOMS: Do you have any other symptoms? (e.g., flank pain, penis discharge, scrotal pain, blood in urine)     Flank pain, headache  Protocols used: Urination Pain -  Male-A-AH

## 2024-01-24 NOTE — Progress Notes (Signed)
LILLETTE Kristeen JINNY Gladis, CMA,acting as a Neurosurgeon for Scott Ada, FNP.,have documented all relevant documentation on the behalf of Scott Ada, FNP,as directed by  Scott Ada, FNP while in the presence of Scott Ada, FNP.  Subjective:  Patient ID: Scott Farrell , male    DOB: Jan 01, 1981 , 43 y.o.   MRN: [id number]  Chief Complaint  Patient presents with   Dysuria    Patient presents today for pain when urinating he reports he doesn't know when the pain started. He reports he has back pain and sometimes when he urinates its only a little. He reports the pain is getting worse.  Patient reports he was told he has kidney stones about a week ago.    Headache    Patient reports he also has head pain/pressure in the back of his head that travels to the sides of his head. This started about 3 weeks ago.     HPI  Complains of headache that feels like pressure to the back and radiates around to the front.      Past Medical History:  Diagnosis Date   Clotting disorder (HCC)    personal hx/o? etiology unclear   Costochondritis    history of   Diverticulitis    Herpes genitalia 06/2010   History of echocardiogram 01/07/2011   normal LV function, EF 60%, trace mitral, tricuspid and pulmonic regurgitation; Dr. Jacques Somerset   IBS (irritable bowel syndrome)    Mental disorder 2013   hospitalized for psychiatric illness prior   Normal cardiac stress test 01/07/2011   negative treadmill test, no evidence of ischemia, good exercise capacity; Jacques Somerset, MD;    Pre-diabetes    Prostatitis 11/2010   Dr. Ottelin   Renal stone 06/2012   Urology consult   Schizophrenia [facility])    [facility], [facility] - Dr. Zell Harbour   Tobacco use disorder      Family History  Problem Relation Age of Onset   Hypertension Mother    Stroke Mother    Depression Mother    Acne Mother    Thyroid  disease Mother    Diabetes Mother    Colon cancer Mother 19   Acute lymphoblastic leukemia Father     Diabetes Father    Cancer Brother    Breast cancer Maternal Aunt    Ovarian cancer Maternal Aunt    Esophageal cancer Neg Hx    Stomach cancer Neg Hx      Current Outpatient Medications:    atorvastatin  (LIPITOR) 20 MG tablet, TAKE 1 TABLET EVERY DAY, Disp: 90 tablet, Rfl: 3   benzonatate  (TESSALON  PERLES) 100 MG capsule, Take 1 capsule (100 mg total) by mouth 3 (three) times daily as needed., Disp: 90 capsule, Rfl: 0   divalproex  (DEPAKOTE ) 500 MG DR tablet, Take 500 mg by mouth at bedtime., Disp: , Rfl:    IBUPROFEN  PO, Take 1 tablet by mouth as needed., Disp: , Rfl:    risperidone (RISPERDAL) 4 MG tablet, Take 4 mg by mouth 2 (two) times daily., Disp: , Rfl:    methocarbamol  (ROBAXIN -750) 750 MG tablet, Take 1 tablet (750 mg total) by mouth every 8 (eight) hours as needed for muscle spasms., Disp: 30 tablet, Rfl: 0   nicotine  (NICODERM CQ  - DOSED IN MG/24 HOURS) 14 mg/24hr patch, Place 1 patch (14 mg total) onto the skin daily., Disp: 90 patch, Rfl: 1   Allergies  Allergen Reactions   Metformin  And Related Other (See Comments)    Chest  tightness     Review of Systems  Constitutional: Negative.   Eyes:  Positive for visual disturbance.  Respiratory: Negative.    Cardiovascular: Negative.   Musculoskeletal:        Neck pain to the upper shoulder area  Neurological:  Positive for dizziness.  Psychiatric/Behavioral: Negative.       Today's Vitals   01/24/24 1613  BP: 120/80  Pulse: 62  Temp: 97.8 F (36.6 C)  TempSrc: Oral  Weight: 237 lb (107.5 kg)  Height: 6' 5 (1.956 m)  PainSc: 4   PainLoc: Head   Body mass index is 28.1 kg/m.  Wt Readings from Last 3 Encounters:  01/24/24 237 lb (107.5 kg)  12/30/23 248 lb (112.5 kg)  12/14/23 249 lb (112.9 kg)     Objective:  Physical Exam Vitals and nursing note reviewed.  Constitutional:      General: He is not in acute distress.    Appearance: Normal appearance. He is obese.  HENT:     Head: Normocephalic.   Cardiovascular:     Rate and Rhythm: Normal rate and regular rhythm.     Pulses: Normal pulses.     Heart sounds: Normal heart sounds. No murmur heard. Pulmonary:     Effort: Pulmonary effort is normal. No respiratory distress.     Breath sounds: Normal breath sounds. No wheezing.  Abdominal:     Tenderness: There is no right CVA tenderness or left CVA tenderness.  Musculoskeletal:        General: Tenderness (to base of neck on palpation) present.  Skin:    General: Skin is warm and dry.     Capillary Refill: Capillary refill takes less than 2 seconds.  Neurological:     General: No focal deficit present.     Mental Status: He is alert and oriented to person, place, and time.     Cranial Nerves: No cranial nerve deficit.     Motor: No weakness.  Psychiatric:        Mood and Affect: Mood normal.        Behavior: Behavior normal.        Thought Content: Thought content normal.        Judgment: Judgment normal.     Assessment And Plan:  Dysuria Assessment & Plan: Normal urinalysis. Encouraged to stay well hydrated with water  Orders: -     POCT URINALYSIS DIP (CLINITEK)  Tobacco abuse -     Nicotine ; Place 1 patch (14 mg total) onto the skin daily.  Dispense: 90 patch; Refill: 1  Muscle tension pain Assessment & Plan: Neck tension to base of neck will treat with muscle relaxer.   Orders: -     Methocarbamol ; Take 1 tablet (750 mg total) by mouth every 8 (eight) hours as needed for muscle spasms.  Dispense: 30 tablet; Refill: 0    Return if symptoms worsen or fail to improve.  Patient was given opportunity to ask questions. Patient verbalized understanding of the plan and was able to repeat key elements of the plan. All questions were answered to their satisfaction.    LILLETTE Scott Ada, FNP, have reviewed all documentation for this visit. The documentation on 01/24/24 for the exam, diagnosis, procedures, and orders are all accurate and complete.   IF YOU HAVE BEEN  REFERRED TO A SPECIALIST, IT MAY TAKE 1-2 WEEKS TO SCHEDULE/PROCESS THE REFERRAL. IF YOU HAVE NOT HEARD FROM US /SPECIALIST IN TWO WEEKS, PLEASE GIVE US  A CALL AT [phone] X  [phone].  

## 2024-01-30 DIAGNOSIS — R300 Dysuria: Secondary | ICD-10-CM | POA: Insufficient documentation

## 2024-01-30 NOTE — Assessment & Plan Note (Signed)
 Normal urinalysis. Encouraged to stay well hydrated with water

## 2024-01-30 NOTE — Assessment & Plan Note (Signed)
 Neck tension to base of neck will treat with muscle relaxer.

## 2024-02-03 ENCOUNTER — Other Ambulatory Visit: Payer: Self-pay | Admitting: Licensed Clinical Social Worker

## 2024-02-03 NOTE — Patient Outreach (Signed)
 Complex Care Management   Visit Note  02/03/2024  Name:  Scott Farrell MRN: [id number] DOB: 24-Jan-1981  Situation: Referral received for Complex Care Management related to Mental/Behavioral Health diagnosis Schizophrenia I obtained verbal consent from Patient.  Visit completed with Patient  on the phone  Background:   Past Medical History:  Diagnosis Date   Clotting disorder [facility])    personal hx/o? etiology unclear   Costochondritis    history of   Diverticulitis    Herpes genitalia 06/2010   History of echocardiogram 01/07/2011   normal LV function, EF 60%, trace mitral, tricuspid and pulmonic regurgitation; Dr. Jacques Somerset   IBS (irritable bowel syndrome)    Mental disorder 2013   hospitalized for psychiatric illness prior   Normal cardiac stress test 01/07/2011   negative treadmill test, no evidence of ischemia, good exercise capacity; Jacques Somerset, MD;    Pre-diabetes    Prostatitis 11/2010   Dr. Ottelin   Renal stone 06/2012   Urology consult   Schizophrenia [facility])    [facility], [facility] - Dr. Zell Harbour   Tobacco use disorder     Assessment: Patient Reported Symptoms:  Cognitive Cognitive Status: Alert and oriented to person, place, and time, Normal speech and language skills Cognitive/Intellectual Conditions Management [RPT]: None reported or documented in medical history or problem list   Health Maintenance Behaviors: Annual physical exam  Neurological Neurological Review of Symptoms: Headaches Neurological Management Strategies: Routine screening  HEENT HEENT Symptoms Reported: Not assessed      Cardiovascular Cardiovascular Symptoms Reported: No symptoms reported    Respiratory Respiratory Symptoms Reported: No symptoms reported    Endocrine      Gastrointestinal Gastrointestinal Symptoms Reported: Abdominal pain or discomfort Gastrointestinal Management Strategies: Coping strategies    Genitourinary Genitourinary Symptoms Reported:  Other Additional Genitourinary Details: Pt states he was diagnosed with kidney stones. Priotizing drinking water Genitourinary Management Strategies: Adequate rest, Coping strategies  Integumentary Integumentary Symptoms Reported: No symptoms reported    Musculoskeletal Musculoskelatal Symptoms Reviewed: Back pain Additional Musculoskeletal Details: Pt reports it's from kidney stones Musculoskeletal Management Strategies: Coping strategies, Routine screening Falls in the past year?: No Number of falls in past year: 1 or less Was there an injury with Fall?: No Fall Risk Category Calculator: 0 Patient Fall Risk Level: Low Fall Risk    Psychosocial Psychosocial Symptoms Reported: Substance use, Depression - if selected complete PHQ 2-9 Additional Psychological Details: Patient continues to receive services through [facility] to assist with management of depression symptoms. PCP has prescribed nicotine  patches to assist with tobacco cessation Behavioral Management Strategies: Abstinence from substances, Coping strategies, Medication therapy Major Change/Loss/Stressor/Fears (CP): Medical condition, self Techniques to Cope with Loss/Stress/Change: Diversional activities, Medication      02/03/2024    PHQ2-9 Depression Screening   Little interest or pleasure in doing things    Feeling down, depressed, or hopeless    PHQ-2 - Total Score    Trouble falling or staying asleep, or sleeping too much    Feeling tired or having little energy    Poor appetite or overeating     Feeling bad about yourself - or that you are a failure or have let yourself or your family down    Trouble concentrating on things, such as reading the newspaper or watching television    Moving or speaking so slowly that other people could have noticed.  Or the opposite - being so fidgety or restless that you have been moving around a lot more  than usual    Thoughts that you would be better off dead, or hurting yourself in some  way    PHQ2-9 Total Score    If you checked off any problems, how difficult have these problems made it for you to do your work, take care of things at home, or get along with other people    Depression Interventions/Treatment      There were no vitals filed for this visit.  Medications Reviewed Today     Reviewed by Ezzard Rolin BIRCH, LCSW (Social Worker) on 02/03/24 at 1307  Med List Status: <None>   Medication Order Taking? Sig Documenting Provider Last Dose Status Informant  atorvastatin  (LIPITOR) 20 MG tablet [id number]  TAKE 1 TABLET EVERY DAY Moore, Janece, FNP  Active   benzonatate  (TESSALON  PERLES) 100 MG capsule [id number]  Take 1 capsule (100 mg total) by mouth 3 (three) times daily as needed. Petrina Pries, NP  Active   divalproex  (DEPAKOTE ) 500 MG DR tablet [id number]  Take 500 mg by mouth at bedtime. [provider]  Active Self  IBUPROFEN  PO [id number]  Take 1 tablet by mouth as needed. [provider]  Active Self  methocarbamol  (ROBAXIN -750) 750 MG tablet [id number]  Take 1 tablet (750 mg total) by mouth every 8 (eight) hours as needed for muscle spasms. Georgina Speaks, FNP  Active   nicotine  (NICODERM CQ  - DOSED IN MG/24 HOURS) 14 mg/24hr patch [id number]  Place 1 patch (14 mg total) onto the skin daily. Georgina Speaks, FNP  Active   risperidone (RISPERDAL) 4 MG tablet [id number]  Take 4 mg by mouth 2 (two) times daily. [provider]  Active Self            Recommendation:   Continue Current Plan of Care  Follow Up Plan:   Telephone follow-up in 1 month  Rolin Ezzard, LCSW [facility]  [facility], [facility] Clinical Social Worker Direct Dial: [phone]  Fax: [phone] Website: [email or web address] 1:30 PM

## 2024-02-03 NOTE — Patient Instructions (Signed)
 Visit Information  Thank you for taking time to visit with me today. Please don't hesitate to contact me if I can be of assistance to you before our next scheduled appointment.  Your next care management appointment is by telephone on 10/02 at 1 PM  Please call the care guide team at [phone] if you need to cancel, schedule, or reschedule an appointment.   Please call the Suicide and Crisis Lifeline: 988 go to [facility] Urgent [facility] [address] [place] [phone]) call 911 if you are experiencing a Mental Health or Behavioral Health Crisis or need someone to talk to.  Rolin Kerns, LCSW [place]  [facility], [facility] Clinical Social Worker Direct Dial: [phone]  Fax: [phone] Website: [email or web address] 1:31 PM

## 2024-02-16 ENCOUNTER — Ambulatory Visit: Payer: Self-pay | Admitting: Family Medicine

## 2024-02-16 NOTE — Progress Notes (Signed)
 CT Abdomen showed the following:  No hernia noted.  Diverticulosis without evidence of diverticulitis noted  Left kidney stone without obstruction   Thank you!

## 2024-03-02 ENCOUNTER — Other Ambulatory Visit: Payer: Self-pay | Admitting: Licensed Clinical Social Worker

## 2024-03-02 DIAGNOSIS — R7303 Prediabetes: Secondary | ICD-10-CM

## 2024-03-02 NOTE — Patient Outreach (Signed)
 Complex Care Management   Visit Note  03/02/2024  Name:  Scott Farrell MRN: [id number] DOB: May 21, 1981  Situation: Referral received for Complex Care Management related to Mental/Behavioral Health diagnosis Schizophrenia and SDOH Barriers:  Food insecurity I obtained verbal consent from Patient.  Visit completed with Patient  on the phone  Background:   Past Medical History:  Diagnosis Date   Clotting disorder    personal hx/o? etiology unclear   Costochondritis    history of   Diverticulitis    Herpes genitalia 06/2010   History of echocardiogram 01/07/2011   normal LV function, EF 60%, trace mitral, tricuspid and pulmonic regurgitation; Dr. Jacques Somerset   IBS (irritable bowel syndrome)    Mental disorder 2013   hospitalized for psychiatric illness prior   Normal cardiac stress test 01/07/2011   negative treadmill test, no evidence of ischemia, good exercise capacity; Jacques Somerset, MD;    Pre-diabetes    Prostatitis 11/2010   Dr. Ottelin   Renal stone 06/2012   Urology consult   Schizophrenia [facility])    [facility], [facility] - Dr. Zell Harbour   Tobacco use disorder     Assessment: Patient Reported Symptoms:  Cognitive Cognitive Status: No symptoms reported, Alert and oriented to person, place, and time, Normal speech and language skills Cognitive/Intellectual Conditions Management [RPT]: None reported or documented in medical history or problem list   Health Maintenance Behaviors: Annual physical exam  Neurological Neurological Review of Symptoms: No symptoms reported    HEENT HEENT Symptoms Reported: No symptoms reported      Cardiovascular Cardiovascular Symptoms Reported: No symptoms reported    Respiratory Respiratory Symptoms Reported: No symptoms reported    Endocrine Endocrine Symptoms Reported: No symptoms reported Is patient diabetic?: No    Gastrointestinal Gastrointestinal Symptoms Reported: No symptoms reported      Genitourinary  Genitourinary Symptoms Reported: Other Other Genitourinary Symptoms: Kidney stones Genitourinary Management Strategies: Coping strategies  Integumentary Integumentary Symptoms Reported: No symptoms reported    Musculoskeletal Musculoskelatal Symptoms Reviewed: Back pain, Other Other Musculoskeletal Symptoms: Pt endorses back and foot pain. Reports slight decrease in symptoms after purchasing new shoes and agreed to f/up with PCP if pain worsens Musculoskeletal Management Strategies: Coping strategies, Routine screening Falls in the past year?: No Number of falls in past year: 1 or less Was there an injury with Fall?: No Fall Risk Category Calculator: 0 Patient Fall Risk Level: Low Fall Risk    Psychosocial Psychosocial Symptoms Reported: No symptoms reported Additional Psychological Details: Pt reports better management of mental and substance use resources Behavioral Management Strategies: Abstinence from substances, Coping strategies, Adequate rest, Community resources, Support system, Medication therapy Major Change/Loss/Stressor/Fears (CP): Resources, Medical condition, self Techniques to [employer] with Loss/Stress/Change: Diversional activities, Medication      03/02/2024    PHQ2-9 Depression Screening   Little interest or pleasure in doing things    Feeling down, depressed, or hopeless    PHQ-2 - Total Score    Trouble falling or staying asleep, or sleeping too much    Feeling tired or having little energy    Poor appetite or overeating     Feeling bad about yourself - or that you are a failure or have let yourself or your family down    Trouble concentrating on things, such as reading the newspaper or watching television    Moving or speaking so slowly that other people could have noticed.  Or the opposite - being so fidgety or restless that you  have been moving around a lot more than usual    Thoughts that you would be better off dead, or hurting yourself in some way    PHQ2-9  Total Score    If you checked off any problems, how difficult have these problems made it for you to do your work, take care of things at home, or get along with other people    Depression Interventions/Treatment      There were no vitals filed for this visit.  Medications Reviewed Today     Reviewed by Ocia Simek D, LCSW (Social Worker) on 03/02/24 at 1310  Med List Status: <None>   Medication Order Taking? Sig Documenting Provider Last Dose Status Informant  atorvastatin  (LIPITOR) 20 MG tablet [id number]  TAKE 1 TABLET EVERY DAY Georgina Speaks, FNP  Active   benzonatate  (TESSALON  PERLES) 100 MG capsule [id number]  Take 1 capsule (100 mg total) by mouth 3 (three) times daily as needed. Petrina Pries, NP  Active   divalproex  (DEPAKOTE ) 500 MG DR tablet [id number]  Take 500 mg by mouth at bedtime. [provider]  Active Self  IBUPROFEN  PO [id number]  Take 1 tablet by mouth as needed. [provider]  Active Self  methocarbamol  (ROBAXIN -750) 750 MG tablet [id number]  Take 1 tablet (750 mg total) by mouth every 8 (eight) hours as needed for muscle spasms. Georgina Speaks, FNP  Active   nicotine  (NICODERM CQ  - DOSED IN MG/24 HOURS) 14 mg/24hr patch [id number]  Place 1 patch (14 mg total) onto the skin daily. Georgina Speaks, FNP  Active   risperidone (RISPERDAL) 4 MG tablet [id number]  Take 4 mg by mouth 2 (two) times daily. [provider]  Active Self            Recommendation:   Continue Current Plan of Care  Follow Up Plan:   Telephone follow-up in 1 month  Rolin Kerns, LCSW [facility]  [facility], [facility] Clinical Social Worker Direct Dial: [phone]  Fax: [phone] Website: [email or web address] 1:57 PM

## 2024-03-02 NOTE — Patient Instructions (Signed)
 Visit Information  Thank you for taking time to visit with me today. Please don't hesitate to contact me if I can be of assistance to you before our next scheduled appointment.  Your next care management appointment is by telephone on 11/6 at 1 PM  Please call the care guide team at [phone] if you need to cancel, schedule, or reschedule an appointment.   Please call the Suicide and Crisis Lifeline: 988 go to [facility] Urgent [facility] [address], [place] [phone]) call 911 if you are experiencing a Mental Health or Behavioral Health Crisis or need someone to talk to.  Rolin Kerns, LCSW [place]  [facility], [facility] Clinical Social Worker Direct Dial: [phone]  Fax: [phone] Website: [email or web address] 1:58 PM

## 2024-03-03 ENCOUNTER — Telehealth: Payer: Self-pay

## 2024-03-03 NOTE — Progress Notes (Signed)
 Complex Care Management Note Care Guide Note  03/03/2024 Name: [name] MRN: [id number] DOB: 1980-08-06  Scott Farrell is a 43 y.o. year old male who is a primary care patient of Georgina Speaks, FNP . The community resource team was consulted for assistance with Food Insecurity  SDOH screenings and interventions completed:  Yes  Social Drivers of Health From This Encounter   Food Insecurity: Food Insecurity Present (03/03/2024)   Hunger Vital Sign    Worried About Running Out of Food in the Last Year: Sometimes true    Ran Out of Food in the Last Year: Sometimes true  Housing: Low Risk  (03/03/2024)   Housing Stability Vital Sign    Unable to Pay for Housing in the Last Year: No    Number of Times Moved in the Last Year: 0    Homeless in the Last Year: No  Financial Resource Strain: Patient Declined (03/03/2024)   Overall Financial Resource Strain (CARDIA)    Difficulty of Paying Living Expenses: Patient declined  Transportation Needs: No Transportation Needs (03/03/2024)   PRAPARE - Administrator, Civil Service (Medical): No    Lack of Transportation (Non-Medical): No  Utilities: Not At Risk (03/03/2024)   Utilities    Threatened with loss of utilities: No    SDOH Interventions Today    Flowsheet Row Most Recent Value  SDOH Interventions   Food Insecurity Interventions Other (Comment), [facility] [employer] Referral  [Verified email to send [employer] list.]  Financial Strain Interventions Patient Declined     Care guide performed the following interventions: Patient provided with information about care guide support team and interviewed to confirm resource needs.  Follow Up Plan:  No further follow up planned at this time. The patient has been provided with needed resources.  Encounter Outcome:  Patient Visit Completed  [facility]  [facility] Guide Direct Dial: [phone]   Fax: [phone] Website: [email or web address]

## 2024-04-06 ENCOUNTER — Other Ambulatory Visit: Payer: Self-pay | Admitting: Licensed Clinical Social Worker

## 2024-04-09 DIAGNOSIS — Z8249 Family history of ischemic heart disease and other diseases of the circulatory system: Secondary | ICD-10-CM | POA: Diagnosis not present

## 2024-04-09 DIAGNOSIS — K5900 Constipation, unspecified: Secondary | ICD-10-CM | POA: Diagnosis not present

## 2024-04-09 DIAGNOSIS — F411 Generalized anxiety disorder: Secondary | ICD-10-CM | POA: Diagnosis not present

## 2024-04-09 DIAGNOSIS — Z833 Family history of diabetes mellitus: Secondary | ICD-10-CM | POA: Diagnosis not present

## 2024-04-09 DIAGNOSIS — Z888 Allergy status to other drugs, medicaments and biological substances status: Secondary | ICD-10-CM | POA: Diagnosis not present

## 2024-04-09 DIAGNOSIS — E785 Hyperlipidemia, unspecified: Secondary | ICD-10-CM | POA: Diagnosis not present

## 2024-04-09 DIAGNOSIS — F324 Major depressive disorder, single episode, in partial remission: Secondary | ICD-10-CM | POA: Diagnosis not present

## 2024-04-09 DIAGNOSIS — F259 Schizoaffective disorder, unspecified: Secondary | ICD-10-CM | POA: Diagnosis not present

## 2024-04-09 DIAGNOSIS — Z809 Family history of malignant neoplasm, unspecified: Secondary | ICD-10-CM | POA: Diagnosis not present

## 2024-04-14 NOTE — Patient Instructions (Signed)
 Visit Information  Thank you for taking time to visit with me today. Please don't hesitate to contact me if I can be of assistance to you before our next scheduled appointment.  Patient has met all care management goals. Care Management case will be closed. Patient has been provided contact information should new needs arise.   Please call the care guide team at [phone] if you need to cancel, schedule, or reschedule an appointment.   Please call the Suicide and Crisis Lifeline: 988 call 1-800-273-TALK (toll free, 24 hour hotline) go to [facility] Urgent [facility] [address], [place] [id number]) call 911 if you are experiencing a Mental Health or Behavioral Health Crisis or need someone to talk to.  Rolin Kerns, LCSW [place]  [facility], [facility] Clinical Social Worker Direct Dial: [phone]  Fax: [phone] Website: [email or web address] 6:11 AM

## 2024-04-14 NOTE — Patient Outreach (Addendum)
 Complex Care Management   Visit Note  04/06/2024  Name:  Scott Farrell MRN: [id number] DOB: 1981-02-15  Situation: Referral received for Complex Care Management related to Mental/Behavioral Health diagnosis Schizophrenia I obtained verbal consent from Patient.  Visit completed with Patient  on the phone  Background:   Past Medical History:  Diagnosis Date   Clotting disorder    personal hx/o? etiology unclear   Costochondritis    history of   Diverticulitis    Herpes genitalia 06/2010   History of echocardiogram 01/07/2011   normal LV function, EF 60%, trace mitral, tricuspid and pulmonic regurgitation; Dr. Jacques Somerset   IBS (irritable bowel syndrome)    Mental disorder 2013   hospitalized for psychiatric illness prior   Normal cardiac stress test 01/07/2011   negative treadmill test, no evidence of ischemia, good exercise capacity; Jacques Somerset, MD;    Pre-diabetes    Prostatitis 11/2010   Dr. Ottelin   Renal stone 06/2012   Urology consult   Schizophrenia [facility] [place])    [facility], [facility] - Dr. Zell Harbour   Tobacco use disorder     Assessment: Patient Reported Symptoms:  Cognitive Cognitive Status: No symptoms reported, Alert and oriented to person, place, and time, Normal speech and language skills Cognitive/Intellectual Conditions Management [RPT]: None reported or documented in medical history or problem list      Neurological Neurological Review of Symptoms: Not assessed    HEENT HEENT Symptoms Reported: Not assessed      Cardiovascular Cardiovascular Symptoms Reported: Not assessed    Respiratory Respiratory Symptoms Reported: Not assesed    Endocrine Endocrine Symptoms Reported: Not assessed    Gastrointestinal Gastrointestinal Symptoms Reported: Not assessed      Genitourinary Genitourinary Symptoms Reported: Not assessed    Integumentary Integumentary Symptoms Reported: Not assessed    Musculoskeletal Musculoskelatal Symptoms  Reviewed: Back pain, Other Musculoskeletal Management Strategies: Coping strategies, Routine screening      Psychosocial Psychosocial Symptoms Reported: No symptoms reported          04/14/2024    PHQ2-9 Depression Screening   Little interest or pleasure in doing things    Feeling down, depressed, or hopeless    PHQ-2 - Total Score    Trouble falling or staying asleep, or sleeping too much    Feeling tired or having little energy    Poor appetite or overeating     Feeling bad about yourself - or that you are a failure or have let yourself or your family down    Trouble concentrating on things, such as reading the newspaper or watching television    Moving or speaking so slowly that other people could have noticed.  Or the opposite - being so fidgety or restless that you have been moving around a lot more than usual    Thoughts that you would be better off dead, or hurting yourself in some way    PHQ2-9 Total Score    If you checked off any problems, how difficult have these problems made it for you to do your work, take care of things at home, or get along with other people    Depression Interventions/Treatment      There were no vitals filed for this visit.    Medications Reviewed Today     Reviewed by Ezzard Rolin BIRCH, LCSW (Social Worker) on 04/14/24 at 0605  Med List Status: <None>   Medication Order Taking? Sig Documenting Provider Last Dose Status Informant  atorvastatin  (LIPITOR) 20  MG tablet [id number]  TAKE 1 TABLET EVERY DAY Georgina Speaks, FNP  Active   benzonatate  (TESSALON  PERLES) 100 MG capsule [id number]  Take 1 capsule (100 mg total) by mouth 3 (three) times daily as needed. Petrina Pries, NP  Active   divalproex  (DEPAKOTE ) 500 MG DR tablet [id number]  Take 500 mg by mouth at bedtime. [provider]  Active Self  IBUPROFEN  PO [id number]  Take 1 tablet by mouth as needed. [provider]  Active Self  methocarbamol  (ROBAXIN -750) 750 MG tablet  [id number]  Take 1 tablet (750 mg total) by mouth every 8 (eight) hours as needed for muscle spasms. Georgina Speaks, FNP  Active   nicotine  (NICODERM CQ  - DOSED IN MG/24 HOURS) 14 mg/24hr patch [id number]  Place 1 patch (14 mg total) onto the skin daily. Georgina Speaks, FNP  Active   risperidone (RISPERDAL) 4 MG tablet [id number]  Take 4 mg by mouth 2 (two) times daily. [provider]  Active Self            Recommendation:   Continue Current Plan of Care  Follow Up Plan:   Patient has met all care management goals. Care Management case will be closed. Patient has been provided contact information should new needs arise.   Rolin Ezzard HUGHS [facility]  [facility], [facility] Clinical Social Worker Direct Dial: [phone]  Fax: [phone] Website: [email or web address] 6:10 AM

## 2024-06-15 ENCOUNTER — Ambulatory Visit: Payer: Self-pay | Admitting: Nurse Practitioner

## 2024-06-28 NOTE — Progress Notes (Unsigned)
 LILLETTE Kristeen JINNY Gladis, CMA,acting as a neurosurgeon for Scott Ada, FNP.,have documented all relevant documentation on the behalf of Scott Ada, FNP,as directed by  Scott Ada, FNP while in the presence of Scott Ada, FNP.  Subjective:  Patient ID: Scott Farrell , male    DOB: 1980-11-11 , 44 y.o.   MRN: [id number]  No chief complaint on file.   HPI  HPI   Past Medical History:  Diagnosis Date   Clotting disorder    personal hx/o? etiology unclear   Costochondritis    history of   Diverticulitis    Herpes genitalia 06/2010   History of echocardiogram 01/07/2011   normal LV function, EF 60%, trace mitral, tricuspid and pulmonic regurgitation; Dr. Jacques Somerset   IBS (irritable bowel syndrome)    Mental disorder 2013   hospitalized for psychiatric illness prior   Normal cardiac stress test 01/07/2011   negative treadmill test, no evidence of ischemia, good exercise capacity; Jacques Somerset, MD;    Pre-diabetes    Prostatitis 11/2010   Dr. Ottelin   Renal stone 06/2012   Urology consult   Schizophrenia [facility])    [facility], [facility] - Dr. Zell Harbour   Tobacco use disorder      Family History  Problem Relation Age of Onset   Hypertension Mother    Stroke Mother    Depression Mother    Acne Mother    Thyroid  disease Mother    Diabetes Mother    Colon cancer Mother 58   Acute lymphoblastic leukemia Father    Diabetes Father    Cancer Brother    Breast cancer Maternal Aunt    Ovarian cancer Maternal Aunt    Esophageal cancer Neg Hx    Stomach cancer Neg Hx     Current Medications[1]   Allergies[2]   Review of Systems   There were no vitals filed for this visit. There is no height or weight on file to calculate BMI.  Wt Readings from Last 3 Encounters:  01/24/24 237 lb (107.5 kg)  12/30/23 248 lb (112.5 kg)  12/14/23 249 lb (112.9 kg)    The ASCVD Risk score (Arnett DK, et al., 2019) failed to calculate for the following reasons:   The valid  total cholesterol range is 130 to 320 mg/dL  Objective:  Physical Exam      Assessment And Plan:   Assessment & Plan Mixed hyperlipidemia  Prediabetes   No orders of the defined types were placed in this encounter.    No follow-ups on file.  Patient was given opportunity to ask questions. Patient verbalized understanding of the plan and was able to repeat key elements of the plan. All questions were answered to their satisfaction.    LILLETTE Scott Ada, FNP, have reviewed all documentation for this visit. The documentation on 06/28/24 for the exam, diagnosis, procedures, and orders are all accurate and complete.   IF YOU HAVE BEEN REFERRED TO A SPECIALIST, IT MAY TAKE 1-2 WEEKS TO SCHEDULE/PROCESS THE REFERRAL. IF YOU HAVE NOT HEARD FROM US /SPECIALIST IN TWO WEEKS, PLEASE GIVE US  A CALL AT [phone] X [phone].     [1]  Current Outpatient Medications:    atorvastatin  (LIPITOR) 20 MG tablet, TAKE 1 TABLET EVERY DAY, Disp: 90 tablet, Rfl: 3   benzonatate  (TESSALON  PERLES) 100 MG capsule, Take 1 capsule (100 mg total) by mouth 3 (three) times daily as needed., Disp: 90 capsule, Rfl: 0   divalproex  (DEPAKOTE ) 500 MG DR tablet, Take 500  mg by mouth at bedtime., Disp: , Rfl:    IBUPROFEN  PO, Take 1 tablet by mouth as needed., Disp: , Rfl:    methocarbamol  (ROBAXIN -750) 750 MG tablet, Take 1 tablet (750 mg total) by mouth every 8 (eight) hours as needed for muscle spasms., Disp: 30 tablet, Rfl: 0   nicotine  (NICODERM CQ  - DOSED IN MG/24 HOURS) 14 mg/24hr patch, Place 1 patch (14 mg total) onto the skin daily., Disp: 90 patch, Rfl: 1   risperidone (RISPERDAL) 4 MG tablet, Take 4 mg by mouth 2 (two) times daily., Disp: , Rfl:  [2]  Allergies Allergen Reactions   Metformin  And Related Other (See Comments)    Chest tightness

## 2024-06-29 ENCOUNTER — Encounter: Payer: Self-pay | Admitting: Nurse Practitioner

## 2024-06-29 ENCOUNTER — Ambulatory Visit: Admitting: Nurse Practitioner

## 2024-06-29 VITALS — BP 100/60 | HR 60 | Temp 98.1°F | Ht 77.0 in | Wt 251.8 lb

## 2024-06-29 DIAGNOSIS — F2089 Other schizophrenia: Secondary | ICD-10-CM | POA: Diagnosis not present

## 2024-06-29 DIAGNOSIS — M791 Myalgia, unspecified site: Secondary | ICD-10-CM

## 2024-06-29 DIAGNOSIS — E782 Mixed hyperlipidemia: Secondary | ICD-10-CM | POA: Diagnosis not present

## 2024-06-29 DIAGNOSIS — R7303 Prediabetes: Secondary | ICD-10-CM

## 2024-06-29 DIAGNOSIS — L84 Corns and callosities: Secondary | ICD-10-CM | POA: Diagnosis not present

## 2024-06-29 LAB — LIPID PANEL
Chol/HDL Ratio: 3.4 ratio (ref 0.0–5.0)
Cholesterol, Total: 104 mg/dL (ref 100–199)
HDL: 31 mg/dL — ABNORMAL LOW
LDL Chol Calc (NIH): 57 mg/dL (ref 0–99)
Triglycerides: 80 mg/dL (ref 0–149)
VLDL Cholesterol Cal: 16 mg/dL (ref 5–40)

## 2024-06-29 LAB — HEMOGLOBIN A1C
Est. average glucose Bld gHb Est-mCnc: 126 mg/dL
Hgb A1c MFr Bld: 6 % — ABNORMAL HIGH (ref 4.8–5.6)

## 2024-06-29 LAB — CMP14+EGFR
ALT: 21 [IU]/L (ref 0–44)
AST: 23 [IU]/L (ref 0–40)
Albumin: 4.4 g/dL (ref 4.1–5.1)
Alkaline Phosphatase: 68 [IU]/L (ref 47–123)
BUN/Creatinine Ratio: 10 (ref 9–20)
BUN: 10 mg/dL (ref 6–24)
Bilirubin Total: 0.4 mg/dL (ref 0.0–1.2)
CO2: 20 mmol/L (ref 20–29)
Calcium: 9.5 mg/dL (ref 8.7–10.2)
Chloride: 101 mmol/L (ref 96–106)
Creatinine, Ser: 1.01 mg/dL (ref 0.76–1.27)
Globulin, Total: 2.7 g/dL (ref 1.5–4.5)
Glucose: 96 mg/dL (ref 70–99)
Potassium: 4.2 mmol/L (ref 3.5–5.2)
Sodium: 139 mmol/L (ref 134–144)
Total Protein: 7.1 g/dL (ref 6.0–8.5)
eGFR: 94 mL/min/{1.73_m2}

## 2024-06-29 MED ORDER — METHOCARBAMOL 750 MG PO TABS: 750.0000 mg | ORAL_TABLET | Freq: Three times a day (TID) | ORAL | 0 refills | Status: AC | PRN

## 2024-06-29 NOTE — Assessment & Plan Note (Addendum)
 Tenderness to posterior neck. Good range of motion. Neck pain likely due to tension or job-related activities. Stress may contribute.  - Evaluated need for muscle relaxer refill.

## 2024-06-29 NOTE — Assessment & Plan Note (Signed)
 Persistent callus causing discomfort. Previous podiatrist evaluation. Family history of diabetes-related foot issues. - Referred to podiatrist for further evaluation.

## 2024-06-29 NOTE — Assessment & Plan Note (Signed)
 A1c was 6, indicating prediabetes. Metformin  previously caused cardiac discomfort. Insurance limits coverage for non-diabetic medications. - Rechecked A1c today. - Consider metformin  if A1c elevated and willing to retry. - Referred to nutritionist for dietary guidance.

## 2024-06-29 NOTE — Assessment & Plan Note (Signed)
 Continue f/u with Behavioral Health

## 2024-06-29 NOTE — Assessment & Plan Note (Signed)
 Continue focusing on low fat diet and increasing intake of fiber.

## 2024-07-03 ENCOUNTER — Ambulatory Visit: Payer: Self-pay | Admitting: Nurse Practitioner

## 2024-07-05 ENCOUNTER — Ambulatory Visit: Payer: Self-pay
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
## 2024-07-06 ENCOUNTER — Encounter: Payer: Self-pay | Admitting: Nurse Practitioner

## 2024-07-06 ENCOUNTER — Ambulatory Visit: Payer: Self-pay | Admitting: Nurse Practitioner

## 2024-07-06 VITALS — BP 130/70 | HR 73 | Temp 97.9°F | Ht 77.0 in | Wt 247.8 lb

## 2024-07-06 DIAGNOSIS — G44209 Tension-type headache, unspecified, not intractable: Secondary | ICD-10-CM | POA: Insufficient documentation

## 2024-07-06 DIAGNOSIS — Z111 Encounter for screening for respiratory tuberculosis: Secondary | ICD-10-CM

## 2024-07-06 DIAGNOSIS — G4489 Other headache syndrome: Secondary | ICD-10-CM

## 2024-07-06 DIAGNOSIS — R0981 Nasal congestion: Secondary | ICD-10-CM | POA: Insufficient documentation

## 2024-07-06 DIAGNOSIS — Z113 Encounter for screening for infections with a predominantly sexual mode of transmission: Secondary | ICD-10-CM

## 2024-07-06 DIAGNOSIS — R61 Generalized hyperhidrosis: Secondary | ICD-10-CM | POA: Insufficient documentation

## 2024-07-06 LAB — POC SOFIA 2 FLU + SARS ANTIGEN FIA
Influenza A, POC: NEGATIVE
Influenza B, POC: NEGATIVE
SARS Coronavirus 2 Ag: NEGATIVE

## 2024-07-06 NOTE — Progress Notes (Unsigned)
 LILLETTE Kristeen JINNY Gladis, CMA,acting as a neurosurgeon for [name], FNP.,have documented all relevant documentation on the behalf of [name], FNP,as directed by  [name], FNP while in the presence of [name], FNP.  Subjective:  Patient ID: Scott Farrell , male    DOB: 30-Oct-1980 , 44 y.o.   MRN: [id number]  Chief Complaint  Patient presents with   Headache    Patient presents today for headaches and night sweats. Patient reports when he gets a headache his vision is blurry for months. Patient reports he feels feverish and night for the past 3 weeks.   Patient would like to be tested for STDs.   Nasal Congestion    Patient reports nasal congestion as well unsure of how long.     HPI   Past Medical History:  Diagnosis Date   Clotting disorder    personal hx/o? etiology unclear   Costochondritis    history of   Diverticulitis    Herpes genitalia 06/2010   History of echocardiogram 01/07/2011   normal LV function, EF 60%, trace mitral, tricuspid and pulmonic regurgitation; Dr. Jacques Somerset   IBS (irritable bowel syndrome)    Mental disorder 2013   hospitalized for psychiatric illness prior   Normal cardiac stress test 01/07/2011   negative treadmill test, no evidence of ischemia, good exercise capacity; Jacques Somerset, MD;    Pre-diabetes    Prostatitis 11/2010   Dr. Ottelin   Renal stone 06/2012   Urology consult   Schizophrenia [facility])    [facility], [facility] - Dr. Zell Harbour   Tobacco use disorder      Family History  Problem Relation Age of Onset   Hypertension Mother    Stroke Mother    Depression Mother    Acne Mother    Thyroid  disease Mother    Diabetes Mother    Colon cancer Mother 50   Acute lymphoblastic leukemia Father    Diabetes Father    Cancer Brother    Breast cancer Maternal Aunt    Ovarian cancer Maternal Aunt    Esophageal cancer Neg Hx    Stomach cancer Neg Hx     Current Medications[1]   Allergies[2]   Review of  Systems  Constitutional: Negative.   HENT: Negative.    Respiratory: Negative.    Cardiovascular: Negative.   Musculoskeletal:  Positive for neck pain.  Neurological: Negative.   Psychiatric/Behavioral: Negative.       Today's Vitals   07/06/24 1544  BP: 130/70  Pulse: 73  Temp: 97.9 F (36.6 C)  TempSrc: Oral  Weight: 247 lb 12.8 oz (112.4 kg)  Height: 6' 5 (1.956 m)  PainSc: 0-No pain   Body mass index is 29.38 kg/m.  Wt Readings from Last 3 Encounters:  07/06/24 247 lb 12.8 oz (112.4 kg)  06/29/24 251 lb 12.8 oz (114.2 kg)  01/24/24 237 lb (107.5 kg)    The ASCVD Risk score (Arnett DK, et al., 2019) failed to calculate for the following reasons:   The valid total cholesterol range is 130 to 320 mg/dL  Objective:  Physical Exam Vitals and nursing note reviewed.  Constitutional:      General: He is not in acute distress.    Appearance: He is well-developed.  Pulmonary:     Effort: Pulmonary effort is normal. No respiratory distress.     Breath sounds: Normal breath sounds. No wheezing.  Neurological:     Mental Status: He is alert.  Assessment And Plan:   Assessment & Plan Other headache syndrome  Night sweats  Acute non intractable tension-type headache  Congestion of nasal sinus  Screening for STDs (sexually transmitted diseases)  Screening for tuberculosis   Orders Placed This Encounter  Procedures   Chlamydia/Gonococcus/Trichomonas, NAA   HIV antibody (with reflex)   RPR   QuantiFERON-TB Gold Plus   CBC no Diff   TSH   POC SOFIA 2 FLU + SARS ANTIGEN FIA     No follow-ups on file.  Patient was given opportunity to ask questions. Patient verbalized understanding of the plan and was able to repeat key elements of the plan. All questions were answered to their satisfaction.    LILLETTE [name], FNP, have reviewed all documentation for this visit. The documentation on 07/06/24 for the exam, diagnosis, procedures, and orders are all  accurate and complete.   IF YOU HAVE BEEN REFERRED TO A SPECIALIST, IT MAY TAKE 1-2 WEEKS TO SCHEDULE/PROCESS THE REFERRAL. IF YOU HAVE NOT HEARD FROM US /SPECIALIST IN TWO WEEKS, PLEASE GIVE US  A CALL AT [phone].      [1]  Current Outpatient Medications:    atorvastatin  (LIPITOR) 20 MG tablet, TAKE 1 TABLET EVERY DAY, Disp: 90 tablet, Rfl: 3   divalproex  (DEPAKOTE ) 500 MG DR tablet, Take 500 mg by mouth at bedtime., Disp: , Rfl:    IBUPROFEN  PO, Take 1 tablet by mouth as needed., Disp: , Rfl:    methocarbamol  (ROBAXIN -750) 750 MG tablet, Take 1 tablet (750 mg total) by mouth every 8 (eight) hours as needed for muscle spasms., Disp: 30 tablet, Rfl: 0   risperidone (RISPERDAL) 4 MG tablet, Take 4 mg by mouth 2 (two) times daily., Disp: , Rfl:    nicotine  (NICODERM CQ  - DOSED IN MG/24 HOURS) 14 mg/24hr patch, Place 1 patch (14 mg total) onto the skin daily. (Patient not taking: Reported on 07/06/2024), Disp: 90 patch, Rfl: 1 [2]  Allergies Allergen Reactions   Metformin  And Related Other (See Comments)    Chest tightness

## 2024-09-13 ENCOUNTER — Ambulatory Visit: Payer: Self-pay

## 2024-12-14 ENCOUNTER — Encounter: Payer: Self-pay | Admitting: Nurse Practitioner
